# Patient Record
Sex: Female | Born: 1945 | Race: White | Hispanic: No | Smoking: Former smoker
Health system: Southern US, Community
[De-identification: ages and names within clinical notes are randomized; demographics above are authoritative.]

## PROBLEM LIST (undated history)

## (undated) DIAGNOSIS — K589 Irritable bowel syndrome without diarrhea: Secondary | ICD-10-CM

## (undated) DIAGNOSIS — F329 Major depressive disorder, single episode, unspecified: Secondary | ICD-10-CM

## (undated) DIAGNOSIS — I10 Essential (primary) hypertension: Secondary | ICD-10-CM

## (undated) DIAGNOSIS — R0609 Other forms of dyspnea: Secondary | ICD-10-CM

## (undated) DIAGNOSIS — J9601 Acute respiratory failure with hypoxia: Secondary | ICD-10-CM

## (undated) DIAGNOSIS — G8929 Other chronic pain: Secondary | ICD-10-CM

## (undated) DIAGNOSIS — K760 Fatty (change of) liver, not elsewhere classified: Secondary | ICD-10-CM

## (undated) DIAGNOSIS — M545 Low back pain, unspecified: Secondary | ICD-10-CM

## (undated) DIAGNOSIS — J189 Pneumonia, unspecified organism: Secondary | ICD-10-CM

## (undated) DIAGNOSIS — I2510 Atherosclerotic heart disease of native coronary artery without angina pectoris: Secondary | ICD-10-CM

## (undated) DIAGNOSIS — F419 Anxiety disorder, unspecified: Secondary | ICD-10-CM

## (undated) DIAGNOSIS — E559 Vitamin D deficiency, unspecified: Secondary | ICD-10-CM

## (undated) DIAGNOSIS — M94269 Chondromalacia, unspecified knee: Secondary | ICD-10-CM

## (undated) DIAGNOSIS — E785 Hyperlipidemia, unspecified: Secondary | ICD-10-CM

## (undated) DIAGNOSIS — H538 Other visual disturbances: Secondary | ICD-10-CM

## (undated) DIAGNOSIS — J42 Unspecified chronic bronchitis: Secondary | ICD-10-CM

## (undated) DIAGNOSIS — R0602 Shortness of breath: Secondary | ICD-10-CM

## (undated) DIAGNOSIS — M1990 Unspecified osteoarthritis, unspecified site: Secondary | ICD-10-CM

## (undated) DIAGNOSIS — G4733 Obstructive sleep apnea (adult) (pediatric): Secondary | ICD-10-CM

## (undated) DIAGNOSIS — Z9989 Dependence on other enabling machines and devices: Secondary | ICD-10-CM

## (undated) DIAGNOSIS — Z86010 Personal history of colonic polyps: Secondary | ICD-10-CM

## (undated) DIAGNOSIS — IMO0002 Reserved for concepts with insufficient information to code with codable children: Secondary | ICD-10-CM

## (undated) DIAGNOSIS — Z8719 Personal history of other diseases of the digestive system: Secondary | ICD-10-CM

## (undated) HISTORY — PX: CATARACT EXTRACTION W/ INTRAOCULAR LENS IMPLANT: SHX1309

## (undated) HISTORY — DX: Other forms of dyspnea: R06.09

## (undated) HISTORY — DX: Hyperlipidemia, unspecified: E78.5

## (undated) HISTORY — DX: Chondromalacia, unspecified knee: M94.269

## (undated) HISTORY — PX: DILATION AND CURETTAGE OF UTERUS: SHX78

## (undated) HISTORY — PX: COLONOSCOPY W/ BIOPSIES AND POLYPECTOMY: SHX1376

## (undated) HISTORY — DX: Personal history of colonic polyps: Z86.010

## (undated) HISTORY — DX: Anxiety disorder, unspecified: F41.9

## (undated) HISTORY — DX: Reserved for concepts with insufficient information to code with codable children: IMO0002

## (undated) HISTORY — PX: JOINT REPLACEMENT: SHX530

## (undated) HISTORY — DX: Vitamin D deficiency, unspecified: E55.9

## (undated) HISTORY — DX: Essential (primary) hypertension: I10

## (undated) HISTORY — DX: Atherosclerotic heart disease of native coronary artery without angina pectoris: I25.10

## (undated) HISTORY — DX: Irritable bowel syndrome, unspecified: K58.9

## (undated) HISTORY — DX: Personal history of other diseases of the digestive system: Z87.19

## (undated) HISTORY — PX: KNEE ARTHROSCOPY: SHX127

## (undated) HISTORY — DX: Major depressive disorder, single episode, unspecified: F32.9

## (undated) HISTORY — DX: Fatty (change of) liver, not elsewhere classified: K76.0

## (undated) HISTORY — PX: BOTOX INJECTION: SHX5754

## (undated) HISTORY — DX: Shortness of breath: R06.02

---
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
## 1970-04-22 HISTORY — PX: NASAL SEPTUM SURGERY: SHX37

## 1973-04-22 HISTORY — PX: TUBAL LIGATION: SHX77

## 1992-04-22 HISTORY — PX: OOPHORECTOMY: SHX86

## 1998-04-08 ENCOUNTER — Encounter: Payer: Self-pay | Admitting: Emergency Medicine

## 1998-04-08 ENCOUNTER — Emergency Department (HOSPITAL_COMMUNITY): Admission: EM | Admit: 1998-04-08 | Discharge: 1998-04-08 | Payer: Self-pay | Admitting: Emergency Medicine

## 1998-10-09 ENCOUNTER — Ambulatory Visit (HOSPITAL_COMMUNITY): Admission: RE | Admit: 1998-10-09 | Discharge: 1998-10-09 | Payer: Self-pay | Admitting: *Deleted

## 1998-11-10 ENCOUNTER — Ambulatory Visit (HOSPITAL_COMMUNITY): Admission: RE | Admit: 1998-11-10 | Discharge: 1998-11-10 | Payer: Self-pay | Admitting: Obstetrics & Gynecology

## 1999-01-19 ENCOUNTER — Other Ambulatory Visit: Admission: RE | Admit: 1999-01-19 | Discharge: 1999-01-19 | Payer: Self-pay | Admitting: Obstetrics & Gynecology

## 1999-11-28 ENCOUNTER — Encounter: Payer: Self-pay | Admitting: *Deleted

## 1999-11-28 ENCOUNTER — Encounter: Admission: RE | Admit: 1999-11-28 | Discharge: 1999-11-28 | Payer: Self-pay | Admitting: *Deleted

## 2000-02-14 ENCOUNTER — Other Ambulatory Visit: Admission: RE | Admit: 2000-02-14 | Discharge: 2000-02-14 | Payer: Self-pay | Admitting: Obstetrics & Gynecology

## 2000-05-16 ENCOUNTER — Encounter: Admission: RE | Admit: 2000-05-16 | Discharge: 2000-05-16 | Payer: Self-pay | Admitting: *Deleted

## 2000-05-16 ENCOUNTER — Encounter: Payer: Self-pay | Admitting: *Deleted

## 2000-05-29 ENCOUNTER — Ambulatory Visit (HOSPITAL_COMMUNITY): Admission: RE | Admit: 2000-05-29 | Discharge: 2000-05-29 | Payer: Self-pay | Admitting: *Deleted

## 2001-02-13 ENCOUNTER — Other Ambulatory Visit: Admission: RE | Admit: 2001-02-13 | Discharge: 2001-02-13 | Payer: Self-pay | Admitting: Obstetrics & Gynecology

## 2002-03-16 ENCOUNTER — Other Ambulatory Visit: Admission: RE | Admit: 2002-03-16 | Discharge: 2002-03-16 | Payer: Self-pay | Admitting: Obstetrics & Gynecology

## 2002-11-11 ENCOUNTER — Ambulatory Visit (HOSPITAL_BASED_OUTPATIENT_CLINIC_OR_DEPARTMENT_OTHER): Admission: RE | Admit: 2002-11-11 | Discharge: 2002-11-11 | Payer: Self-pay | Admitting: Family Medicine

## 2003-04-25 ENCOUNTER — Other Ambulatory Visit: Admission: RE | Admit: 2003-04-25 | Discharge: 2003-04-25 | Payer: Self-pay | Admitting: Obstetrics & Gynecology

## 2004-06-04 ENCOUNTER — Other Ambulatory Visit: Admission: RE | Admit: 2004-06-04 | Discharge: 2004-06-04 | Payer: Self-pay | Admitting: Obstetrics & Gynecology

## 2005-06-25 ENCOUNTER — Other Ambulatory Visit: Admission: RE | Admit: 2005-06-25 | Discharge: 2005-06-25 | Payer: Self-pay | Admitting: Obstetrics & Gynecology

## 2006-08-07 ENCOUNTER — Encounter: Admission: RE | Admit: 2006-08-07 | Discharge: 2006-08-07 | Payer: Self-pay | Admitting: Obstetrics & Gynecology

## 2006-08-29 ENCOUNTER — Ambulatory Visit (HOSPITAL_COMMUNITY): Admission: RE | Admit: 2006-08-29 | Discharge: 2006-08-29 | Payer: Self-pay | Admitting: Obstetrics & Gynecology

## 2006-08-29 ENCOUNTER — Encounter (INDEPENDENT_AMBULATORY_CARE_PROVIDER_SITE_OTHER): Payer: Self-pay | Admitting: Specialist

## 2006-08-29 HISTORY — PX: HYSTEROSCOPY WITH D & C: SHX1775

## 2007-02-12 ENCOUNTER — Emergency Department (HOSPITAL_COMMUNITY): Admission: EM | Admit: 2007-02-12 | Discharge: 2007-02-12 | Payer: Self-pay | Admitting: Unknown Physician Specialty

## 2008-03-16 ENCOUNTER — Encounter: Admission: RE | Admit: 2008-03-16 | Discharge: 2008-03-16 | Payer: Self-pay | Admitting: Diagnostic Radiology

## 2008-10-09 ENCOUNTER — Ambulatory Visit (HOSPITAL_COMMUNITY): Admission: RE | Admit: 2008-10-09 | Discharge: 2008-10-09 | Payer: Self-pay | Admitting: Chiropractic Medicine

## 2009-05-23 HISTORY — PX: TOTAL KNEE ARTHROPLASTY: SHX125

## 2009-05-29 ENCOUNTER — Inpatient Hospital Stay (HOSPITAL_COMMUNITY): Admission: RE | Admit: 2009-05-29 | Discharge: 2009-06-01 | Payer: Self-pay | Admitting: Orthopedic Surgery

## 2009-06-20 DEATH — deceased

## 2010-05-13 ENCOUNTER — Encounter: Payer: Self-pay | Admitting: Obstetrics & Gynecology

## 2010-07-11 LAB — CBC
HCT: 30.4 % — ABNORMAL LOW (ref 36.0–46.0)
HCT: 32.8 % — ABNORMAL LOW (ref 36.0–46.0)
HCT: 34.5 % — ABNORMAL LOW (ref 36.0–46.0)
HCT: 41.5 % (ref 36.0–46.0)
Hemoglobin: 10.9 g/dL — ABNORMAL LOW (ref 12.0–15.0)
Hemoglobin: 11.6 g/dL — ABNORMAL LOW (ref 12.0–15.0)
Hemoglobin: 12.3 g/dL (ref 12.0–15.0)
Hemoglobin: 14.4 g/dL (ref 12.0–15.0)
MCHC: 34.8 g/dL (ref 30.0–36.0)
MCHC: 35.3 g/dL (ref 30.0–36.0)
MCHC: 35.9 g/dL (ref 30.0–36.0)
MCV: 98.3 fL (ref 78.0–100.0)
MCV: 99 fL (ref 78.0–100.0)
MCV: 99.2 fL (ref 78.0–100.0)
RBC: 3.1 MIL/uL — ABNORMAL LOW (ref 3.87–5.11)
RBC: 3.31 MIL/uL — ABNORMAL LOW (ref 3.87–5.11)
RBC: 3.48 MIL/uL — ABNORMAL LOW (ref 3.87–5.11)
RBC: 4.18 MIL/uL (ref 3.87–5.11)
RDW: 10.9 % — ABNORMAL LOW (ref 11.5–15.5)
WBC: 9.1 10*3/uL (ref 4.0–10.5)

## 2010-07-11 LAB — COMPREHENSIVE METABOLIC PANEL
Albumin: 3.7 g/dL (ref 3.5–5.2)
Alkaline Phosphatase: 68 U/L (ref 39–117)
BUN: 25 mg/dL — ABNORMAL HIGH (ref 6–23)
Calcium: 9.4 mg/dL (ref 8.4–10.5)
Creatinine, Ser: 0.9 mg/dL (ref 0.4–1.2)
Glucose, Bld: 107 mg/dL — ABNORMAL HIGH (ref 70–99)
Potassium: 4.3 mEq/L (ref 3.5–5.1)
Total Protein: 7.2 g/dL (ref 6.0–8.3)

## 2010-07-11 LAB — PROTIME-INR
INR: 1.04 (ref 0.00–1.49)
INR: 1.1 (ref 0.00–1.49)
INR: 1.73 — ABNORMAL HIGH (ref 0.00–1.49)
Prothrombin Time: 13.5 seconds (ref 11.6–15.2)
Prothrombin Time: 21.3 seconds — ABNORMAL HIGH (ref 11.6–15.2)

## 2010-07-11 LAB — BASIC METABOLIC PANEL
CO2: 26 mEq/L (ref 19–32)
CO2: 28 mEq/L (ref 19–32)
Calcium: 8.3 mg/dL — ABNORMAL LOW (ref 8.4–10.5)
Chloride: 102 mEq/L (ref 96–112)
GFR calc Af Amer: >60 mL/min (ref >60)
GFR calc Af Amer: >60 mL/min (ref >60)
GFR calc non Af Amer: >60 mL/min (ref >60)
Glucose, Bld: 151 mg/dL — ABNORMAL HIGH (ref 70–99)
Potassium: 4 mEq/L (ref 3.5–5.1)
Potassium: 4.1 mEq/L (ref 3.5–5.1)
Sodium: 133 mEq/L — ABNORMAL LOW (ref 135–145)

## 2010-07-11 LAB — URINALYSIS, ROUTINE W REFLEX MICROSCOPIC
Bilirubin Urine: NEGATIVE
Glucose, UA: NEGATIVE mg/dL
Hgb urine dipstick: NEGATIVE
Ketones, ur: NEGATIVE mg/dL
Specific Gravity, Urine: 1.022 (ref 1.005–1.030)
pH: 5.5 (ref 5.0–8.0)

## 2010-07-11 LAB — TYPE AND SCREEN: Antibody Screen: NEGATIVE

## 2010-09-04 NOTE — Op Note (Signed)
[name], [name] NO.:  [id number]   MEDICAL RECORD NO.:  [id number]          PATIENT TYPE:  AMB   LOCATION:  SDC                           FACILITY:  [facility]   PHYSICIAN:  Freddy Finner, M.D.   DATE OF BIRTH:  1945/08/31   DATE OF PROCEDURE:  08/29/2006  DATE OF DISCHARGE:                               OPERATIVE REPORT   PREOPERATIVE DIAGNOSES:  1. Postmenopausal bleeding.  2. Endometrial polyp.   POSTOPERATIVE DIAGNOSES:  1. Postmenopausal bleeding.  2. Endometrial polyp.   PROCEDURE:  Hysteroscopy, D&C, and resection of endometrial polyp.   ANESTHESIA:  General.   ESTIMATED BLOOD LOSS:  10 mL.   SORBITOL DEFICIT:  65 mL.   INTRAOPERATIVE COMPLICATIONS:  None.   INDICATIONS FOR PROCEDURE:  The patient is a 65 year old on hormone  replacement therapy who had an episode of postmenopausal bleeding.  She  had a sonohistogram in the office demonstrating an endometrial polyp.  She is admitted now for hysteroscopy, D&C.   DESCRIPTION OF PROCEDURE:  She was admitted on the morning of surgery.  She was given a gram of Ancef preoperatively.  She was brought to the  operating room and placed under adequate general anesthesia.  She was  placed in the dorsal lithotomy position using the [employer] system.  Betadine prep of the mons, perineum, and vagina was carried out in the  usual fashion.  Sterile drapes were applied.   A bivalve speculum was introduced.  The cervix was grasped with a single-  tooth tenaculum on the anterior lip.  The os finder was then used to  navigate the internal os.  The uterus sounded to 9 cm.  The cervix was  progressively dilated to 23 with [facility] dilators.  A 12.5-degree  hysteroscope was introduced using 3% sorbitol as the distending medium.  The polyp was easily visualized, and photographs were made.  Gentle  thorough curettage and exploration with Randall stone forceps was  carried out.  Good inspection revealed adequate  resection of the polyp  and adequate endometrial sampling.  The procedure was terminated.  The  instruments were removed.   The patient was awakened and taken to the recovery room in good  condition.  She will be given routine outpatient surgical instructions.  She should follow up in the office in a week.  She has Vicodin to be  taken as needed for postoperative pain.      Freddy Finner, M.D.  Electronically Signed     WRN/MEDQ  D:  08/29/2006  T:  08/29/2006  Job:  [id number]

## 2011-01-15 ENCOUNTER — Other Ambulatory Visit: Payer: Self-pay | Admitting: Family Medicine

## 2011-01-15 DIAGNOSIS — M79609 Pain in unspecified limb: Secondary | ICD-10-CM

## 2011-01-17 ENCOUNTER — Ambulatory Visit
Admission: RE | Admit: 2011-01-17 | Discharge: 2011-01-17 | Disposition: A | Discharge status: Home / Self Care | Payer: Medicare HMO | Source: Ambulatory Visit | Attending: Family Medicine | Admitting: Family Medicine

## 2011-01-17 DIAGNOSIS — M79609 Pain in unspecified limb: Secondary | ICD-10-CM

## 2011-01-30 LAB — URINALYSIS, ROUTINE W REFLEX MICROSCOPIC
Bilirubin Urine: NEGATIVE
Glucose, UA: NEGATIVE
Hgb urine dipstick: NEGATIVE
Ketones, ur: NEGATIVE
Nitrite: NEGATIVE
Protein, ur: NEGATIVE
Specific Gravity, Urine: 1.012
Urobilinogen, UA: 0.2
pH: 5.5

## 2011-01-30 LAB — COMPREHENSIVE METABOLIC PANEL WITH GFR
AST: 20
Albumin: 3.7
BUN: 14
Chloride: 103
Creatinine, Ser: 0.71
GFR calc Af Amer: >60
Total Bilirubin: 0.9
Total Protein: 6.9

## 2011-01-30 LAB — CBC
HCT: 39.6
Hemoglobin: 14.1
MCHC: 35.6
MCV: 95.3
Platelets: 258
RBC: 4.15
RDW: 11.6
WBC: 9.5

## 2011-01-30 LAB — COMPREHENSIVE METABOLIC PANEL
ALT: 36 — ABNORMAL HIGH
Alkaline Phosphatase: 76
CO2: 25
Calcium: 9.3
GFR calc non Af Amer: >60
Glucose, Bld: 111 — ABNORMAL HIGH
Potassium: 4.1
Sodium: 138

## 2011-01-30 LAB — DIFFERENTIAL
Basophils Absolute: 0
Basophils Relative: 0
Eosinophils Absolute: 0.1
Eosinophils Relative: 1
Lymphocytes Relative: 26
Lymphs Abs: 2.5
Monocytes Absolute: 1.3 — ABNORMAL HIGH
Monocytes Relative: 13 — ABNORMAL HIGH
Neutro Abs: 5.7
Neutrophils Relative %: 59

## 2011-01-30 LAB — LIPASE, BLOOD: Lipase: 42

## 2011-02-03 ENCOUNTER — Emergency Department (HOSPITAL_COMMUNITY): Payer: Medicare HMO

## 2011-02-03 ENCOUNTER — Emergency Department (HOSPITAL_COMMUNITY)
Admission: EM | Admit: 2011-02-03 | Discharge: 2011-02-04 | Disposition: A | Discharge status: Home / Self Care | Payer: Medicare HMO | Attending: Emergency Medicine | Admitting: Emergency Medicine

## 2011-02-03 DIAGNOSIS — R062 Wheezing: Secondary | ICD-10-CM | POA: Insufficient documentation

## 2011-02-03 DIAGNOSIS — R05 Cough: Secondary | ICD-10-CM | POA: Insufficient documentation

## 2011-02-03 DIAGNOSIS — F17200 Nicotine dependence, unspecified, uncomplicated: Secondary | ICD-10-CM | POA: Insufficient documentation

## 2011-02-03 DIAGNOSIS — R000 Tachycardia, unspecified: Secondary | ICD-10-CM | POA: Insufficient documentation

## 2011-02-03 DIAGNOSIS — R059 Cough, unspecified: Secondary | ICD-10-CM | POA: Insufficient documentation

## 2011-02-03 DIAGNOSIS — R509 Fever, unspecified: Secondary | ICD-10-CM | POA: Insufficient documentation

## 2011-02-03 DIAGNOSIS — J40 Bronchitis, not specified as acute or chronic: Secondary | ICD-10-CM | POA: Insufficient documentation

## 2011-02-03 DIAGNOSIS — R0602 Shortness of breath: Secondary | ICD-10-CM | POA: Insufficient documentation

## 2011-05-02 ENCOUNTER — Other Ambulatory Visit: Payer: Self-pay | Admitting: Orthopedic Surgery

## 2011-05-13 ENCOUNTER — Encounter: Payer: Self-pay | Admitting: Vascular Surgery

## 2011-05-14 ENCOUNTER — Encounter: Payer: Self-pay | Admitting: Vascular Surgery

## 2011-05-15 ENCOUNTER — Ambulatory Visit (INDEPENDENT_AMBULATORY_CARE_PROVIDER_SITE_OTHER): Payer: BC Managed Care – PPO | Admitting: Vascular Surgery

## 2011-05-15 ENCOUNTER — Encounter: Payer: Self-pay | Admitting: Vascular Surgery

## 2011-05-15 VITALS — BP 126/85 | HR 99 | Resp 16 | Ht 60.0 in | Wt 181.0 lb

## 2011-05-15 DIAGNOSIS — M79609 Pain in unspecified limb: Secondary | ICD-10-CM | POA: Insufficient documentation

## 2011-05-15 NOTE — Assessment & Plan Note (Addendum)
This patient presented with pain in the pretibial area of her right leg. She states that she's had this for 2 years. The pain is aggravated by standing and relieved somewhat with elevation. Based on her Doppler study she has normal arterial flow bilaterally. I do not think her pain is related to arterial insufficiency. She does have telangiectasias in the right leg and she could be having some aching pain in the right leg related to venous insufficiency. We've discussed the importance of intermittent leg elevation. She can also consider wearing a compression stockings with a mild gradient of 15-20 mm of mercury. She is scheduled for a knee replacement on the right I do not see any contraindications for that from a vascular standpoint.

## 2011-05-15 NOTE — Progress Notes (Signed)
[facility]   Patient name: Michelle [name] MRN: [id number] DOB: April 30, 1945 Sex: female  REASON FOR CONSULT: right leg pain. Referred by Dr. Despina Hick  HPI: Michelle [name] is a 66 y.o. female who is had a 2 year history of pain in her right leg. She describes pain in the pretibial area of her right leg which is aggravated by standing and relieved somewhat with elevation. I do not get any history of claudication, rest pain, or nonhealing ulcers.  She's had a previous  Left total knee replacement and is now scheduled for right total replacement and April.  she has had no previous history of DVT or phlebitis.  Past Medical History  Diagnosis Date  . Chondromalacia of knee     right knee  . IBS (irritable bowel syndrome)   . Depression   . Hyperlipidemia   . Other abnormal glucose   . DDD (degenerative disc disease)     Family History  Problem Relation Age of Onset  . Heart disease Mother     Heart Disease before age 58  . Heart disease Father   . Hypertension Sister     SOCIAL HISTORY: History  Substance Use Topics  . Smoking status: Current Everyday Smoker -- 0.2 packs/day  . Smokeless tobacco: Not on file  . Alcohol Use: Yes     socially    No Known Allergies  Current Outpatient Prescriptions  Medication Sig Dispense Refill  . phentermine (ADIPEX-P) 37.5 MG tablet Take 37.5 mg by mouth daily before breakfast.      . Sertraline HCl (ZOLOFT PO) Take by mouth.      Marland Kitchen aspirin 81 MG tablet Take 81 mg by mouth daily.      Marland Kitchen HYDROcodone-acetaminophen (NORCO) 5-325 MG per tablet Take 1 tablet by mouth every 6 (six) hours as needed.        REVIEW OF SYSTEMS: Arly.Keller ] denotes positive finding; [  ] denotes negative finding  CARDIOVASCULAR:  [ ]  chest pain   [ ]  chest pressure   [ ]  palpitations   [ ]  orthopnea   [ ]  dyspnea on exertion   [ ]  claudication   [ ]  rest pain   [ ]  DVT   [ ]  phlebitis PULMONARY:   [ ]  productive cough   [ ]  asthma   [ ]   wheezing NEUROLOGIC:   [ ]  weakness  [ ]  paresthesias  [ ]  aphasia  [ ]  amaurosis  [ ]  dizziness HEMATOLOGIC:   [ ]  bleeding problems   [ ]  clotting disorders MUSCULOSKELETAL:  [ ]  joint pain   [ ]  joint swelling [ ]  leg swelling GASTROINTESTINAL: [ ]   blood in stool  [ ]   hematemesis GENITOURINARY:  [ ]   dysuria  [ ]   hematuria PSYCHIATRIC:  [ ]  history of major depression INTEGUMENTARY:  [ ]  rashes  [ ]  ulcers CONSTITUTIONAL:  [ ]  fever   [ ]  chills  PHYSICAL EXAM: Filed Vitals:   05/15/11 1048  BP: 126/85  Pulse: 99  Resp: 16  Height: 5' (1.524 m)  Weight: 181 lb (82.101 kg)  SpO2: 99%   Body mass index is 35.35 kg/(m^2). GENERAL: The patient is a well-nourished female, in no acute distress. The vital signs are documented above. CARDIOVASCULAR: There is a regular rate and rhythm without significant murmur appreciated. I do not detect carotid bruits. She has palpable femoral pulses. I cannot palpate pedal pulses although both feet are warm and  well-perfused. She has no significant lower extremity swelling. PULMONARY: There is good air exchange bilaterally without wheezing or rales. ABDOMEN: Soft and non-tender with normal pitched bowel sounds.  MUSCULOSKELETAL: There are no major deformities or cyanosis. NEUROLOGIC: No focal weakness or paresthesias are detected. SKIN: she has some spider veins and telangiectasias bilaterally. PSYCHIATRIC: The patient has a normal affect.  DATA:  I did perform an arterial Doppler study in the office today she has a biphasic Doppler signals in the dorsalis pedis and posterior tibial positions bilaterally.  MEDICAL ISSUES: Pain in limb This patient presented with pain in the pretibial area of her right leg. She states that she's had this for 2 years. The pain is aggravated by standing and relieved somewhat with elevation. Based on her Doppler study she has normal arterial flow bilaterally. I do not think her pain is related to arterial  insufficiency. She does have telangiectasias in the right leg and she could be having some aching pain in the right leg related to venous insufficiency. We've discussed the importance of intermittent leg elevation. She can also consider wearing a compression stockings with a mild gradient of 15-20 mm of mercury. She is scheduled for a knee replacement on the right I do not see any contraindications for that from a vascular standpoint.   I will see her back as needed.  DICKSON,CHRISTOPHER S [facility] [place] Beeper: [phone]

## 2011-07-22 ENCOUNTER — Encounter (HOSPITAL_COMMUNITY): Payer: Self-pay | Admitting: Pharmacy Technician

## 2011-07-24 ENCOUNTER — Encounter (HOSPITAL_COMMUNITY): Payer: Self-pay

## 2011-07-24 ENCOUNTER — Encounter (HOSPITAL_COMMUNITY)
Admission: RE | Admit: 2011-07-24 | Discharge: 2011-07-24 | Disposition: A | Discharge status: Home / Self Care | Payer: BC Managed Care – PPO | Source: Ambulatory Visit | Attending: Orthopedic Surgery | Admitting: Orthopedic Surgery

## 2011-07-24 DIAGNOSIS — F32A Depression, unspecified: Secondary | ICD-10-CM

## 2011-07-24 DIAGNOSIS — IMO0002 Reserved for concepts with insufficient information to code with codable children: Secondary | ICD-10-CM

## 2011-07-24 HISTORY — DX: Depression, unspecified: F32.A

## 2011-07-24 HISTORY — DX: Reserved for concepts with insufficient information to code with codable children: IMO0002

## 2011-07-24 LAB — URINALYSIS, ROUTINE W REFLEX MICROSCOPIC
Bilirubin Urine: NEGATIVE
Ketones, ur: NEGATIVE mg/dL
Nitrite: NEGATIVE
pH: 5 (ref 5.0–8.0)

## 2011-07-24 LAB — CBC
HCT: 42.7 % (ref 36.0–46.0)
Hemoglobin: 14.7 g/dL (ref 12.0–15.0)
MCV: 101.4 fL — ABNORMAL HIGH (ref 78.0–100.0)
Platelets: 262 10*3/uL (ref 150–400)
RBC: 4.21 MIL/uL (ref 3.87–5.11)
WBC: 6.9 10*3/uL (ref 4.0–10.5)

## 2011-07-24 LAB — COMPREHENSIVE METABOLIC PANEL
AST: 19 U/L (ref 0–37)
BUN: 21 mg/dL (ref 6–23)
CO2: 27 mEq/L (ref 19–32)
Chloride: 103 mEq/L (ref 96–112)
Creatinine, Ser: 0.71 mg/dL (ref 0.50–1.10)
GFR calc non Af Amer: 89 mL/min — ABNORMAL LOW (ref >90)
Glucose, Bld: 118 mg/dL — ABNORMAL HIGH (ref 70–99)
Total Bilirubin: 0.4 mg/dL (ref 0.3–1.2)

## 2011-07-24 LAB — SURGICAL PCR SCREEN: Staphylococcus aureus: INVALID — AB

## 2011-07-24 LAB — PROTIME-INR: INR: 0.91 (ref 0.00–1.49)

## 2011-07-24 NOTE — Patient Instructions (Signed)
20 Michelle [name]  07/24/2011   Your procedure is scheduled on: 4-15  -2013  Report to [facility] at   0845     AM .  Call this number if you have problems the morning of surgery: [phone]   Remember:   Do not eat food:After Midnight.    Take these medicines the morning of surgery with A SIP OF WATER: Hydrocodone. Sertraline.   Do not wear jewelry, make-up or nail polish.  Do not wear lotions, powders, or perfumes. You may wear deodorant.  Do not shave 48 hours prior to surgery.(face and neck okay, no shaving of legs)  Do not bring valuables to the hospital.  Contacts, dentures or bridgework may not be worn into surgery.  Leave suitcase in the car. After surgery it may be brought to your room.  For patients admitted to the hospital, checkout time is 11:00 AM the day of discharge.   Patients discharged the day of surgery will not be allowed to drive home.  Name and phone number of your driver: daughter-[name] [phone] Special Instructions: CHG Shower Use Special Wash: 1/2 bottle night before surgery and 1/2 bottle morning of surgery.(avoid face and genitals)   Please read over the following fact sheets that you were given: MRSA Information, Blood Transfusion fact sheet, Incentive Spirometry Instruction.

## 2011-07-26 LAB — MRSA CULTURE

## 2011-07-30 ENCOUNTER — Other Ambulatory Visit: Payer: Self-pay | Admitting: Orthopedic Surgery

## 2011-08-04 ENCOUNTER — Other Ambulatory Visit: Payer: Self-pay | Admitting: Orthopedic Surgery

## 2011-08-04 NOTE — H&P (Signed)
Michelle [name]  DOB: Sep 20, 1945 Single / Language: Undefined / Race: Undefined Female  Date of Admission:  08/05/2011  Chief Complaint:  Right Knee Pain  History of Present Illness The patient is a 66 year old female who comes in for a preoperative History and Physical. The patient is scheduled for a right total knee arthroplasty to be performed by Dr. Gus Rankin. Aluisio, MD at [facility] on 08/05/2011. The patient is a 66 year old female who is being followed for their right leg pain. She has had her left knee replaced in the past. Symptoms reported on her right knee include: pain, pain at night and swelling. The patient feels that they are doing poorly. Ms. [name] states that the right knee is what is giving her the most trouble now. She has pain at all times. The pain is anterior and posterior. She has a lot of pain in her lateral hamstrings. This knee is starting to hurt like the other one did prior to her surgery. She said the left knee is doing fantastic with no problems there. She is ready to proceed with surgery. They have been treated conservatively in the past for the above stated problem and despite conservative measures, they continue to have progressive pain and severe functional limitations and dysfunction. They have failed non-operative management. It is felt that they would benefit from undergoing total joint replacement. Risks and benefits of the procedure have been discussed with the patient and they elect to proceed with surgery. There are no active contraindications to surgery such as ongoing infection or rapidly progressive neurological disease.  Allergies No Known Allergies.  Medication History Zoloft ( Oral) Specific dose unknown - Active. OxyCODONE HCl (5MG  Tablet, 1-2 TAB PO Q 4-6 H PRN PAIN  Past Surgical History Cesarean Delivery. 2 times; 1972 & 1975 Cataract Surgery. Date: 2012. bilateral Straighten Nasal Septum Dilation and Curettage of Uterus  - Multiple Arthroscopy of Knee. bilateral Appendectomy. Date: 40. Breast Mass; Local Excision. right (benign) Total Knee Replacement. Date: 2011. left Tubal Ligation. Date: 58. Oophorectomy; Unilateral. Date: 70.  Problem List/Past Medical Osteoarthrosis NOS, lower leg Anxiety Disorder Irritable bowel syndrome Depression Bronchitis. Past History Pneumonia. Past History Menopause  Family History Cerebrovascular Accident. father Hypertension. sister Heart Disease. mother and father  Social History Illicit drug use. no Exercise. Exercises weekly; does other Marital status. divorced Living situation. live alone Drug/Alcohol Rehab (Previously). no Children. 2 Alcohol use. current drinker; drinks wine; 5-7 per week Drug/Alcohol Rehab (Currently). 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 2-3 Tobacco use. Current every day smoker. current every day smoker; smoke(d) less than 1/2 pack(s) per day Tobacco / smoke exposure. no Post-Surgical Plans. Plan is to go home. Advance Directives. Living Will, Healthcare POA  Review of Systems General:Not Present- Chills, Fever, Night Sweats, Fatigue, Weight Gain, Weight Loss and Memory Loss. Skin:Not Present- Hives, Itching, Rash, Eczema and Lesions. HEENT:Not Present- Tinnitus, Headache, Double Vision, Visual Loss, Hearing Loss and Dentures. Respiratory:Not Present- Shortness of breath with exertion, Shortness of breath at rest, Allergies, Coughing up blood and Chronic Cough. Cardiovascular:Not Present- Chest Pain, Racing/skipping heartbeats, Difficulty Breathing Lying Down, Murmur, Swelling and Palpitations. Gastrointestinal:Not Present- Bloody Stool, Heartburn, Abdominal Pain, Vomiting, Nausea, Constipation, Diarrhea, Difficulty Swallowing, Jaundice and Loss of appetitie. Female Genitourinary:Not Present- Blood in Urine, Urinary frequency,  Weak urinary stream, Discharge, Flank Pain, Incontinence, Painful Urination, Urgency, Urinary Retention and Urinating at Night. Musculoskeletal:Present- Joint Pain. Not Present-  Muscle Weakness, Muscle Pain, Joint Swelling, Back Pain, Morning Stiffness and Spasms. Neurological:Not Present- Tremor, Dizziness, Blackout spells, Paralysis, Difficulty with balance and Weakness. Psychiatric:Not Present- Insomnia.  Vitals Pulse: 84 (Regular) Resp.: 16 (Unlabored) BP: 128/78 (Sitting, Left Arm, Standard)  Physical Exam The physical exam findings are as follows: Patient is a 66 year old female with continued knee pain.   General Mental Status - Alert, cooperative and good historian. General Appearance- pleasant. Not in acute distress. Orientation- Oriented X3. Build & Nutrition- Well nourished and Well developed.   Head and Neck Head- normocephalic, atraumatic . Neck Global Assessment- supple. no bruit auscultated on the right and no bruit auscultated on the left.   Eye Pupil- Bilateral- Regular and Round. Note: reading glasses Motion- Bilateral- EOMI.   Chest and Lung Exam Auscultation: Breath sounds:- clear at anterior chest wall and - clear at posterior chest wall. Adventitious sounds:- No Adventitious sounds.   Cardiovascular Auscultation:Rhythm- Regular rate and rhythm. Heart Sounds- S1 WNL and S2 WNL. Murmurs & Other Heart Sounds:Auscultation of the heart reveals - No Murmurs.   Abdomen Palpation/Percussion:Tenderness- Abdomen is non-tender to palpation. Rigidity (guarding)- Abdomen is soft. Auscultation:Auscultation of the abdomen reveals - Bowel sounds normal.   Female Genitourinary Not done, not pertinent to present illness  Musculoskeletal She is alert and oriented. She is in no apparent distress. The right knee shows no effusion. The range is 5-120 with moderate crepitus on range of motion. There is tenderness medial and  lateral. No instability is noted. There is no Baker's cyst palpable. The lower leg edema is essentially gone now. The pulses are intact with intact sensation distally.  RADIOGRAPHS: AP and lateral of the knee taken today shows she has essentially bone on bone arthritis, medial and patellofemoral compartments. There is a sliver of space left but this is very close to bone on bone. She has osteophyte formation present also.  Assessment & Plan Osteoarthrosis Right Knee  Plan is for a Right Total Knee Replacement by Dr. Lequita Halt.  Plan is to go home after the hospital stay.  Avel Peace, PA-C

## 2011-08-05 ENCOUNTER — Encounter (HOSPITAL_COMMUNITY): Payer: Self-pay | Admitting: Anesthesiology

## 2011-08-05 ENCOUNTER — Ambulatory Visit (HOSPITAL_COMMUNITY): Payer: BC Managed Care – PPO | Admitting: Anesthesiology

## 2011-08-05 ENCOUNTER — Encounter (HOSPITAL_COMMUNITY)
Admission: RE | Disposition: A | Discharge status: Home Health | Payer: Self-pay | Source: Ambulatory Visit | Attending: Orthopedic Surgery

## 2011-08-05 ENCOUNTER — Encounter (HOSPITAL_COMMUNITY): Payer: Self-pay | Admitting: Orthopedic Surgery

## 2011-08-05 ENCOUNTER — Inpatient Hospital Stay (HOSPITAL_COMMUNITY)
Admission: RE | Admit: 2011-08-05 | Discharge: 2011-08-08 | DRG: 209 | Disposition: A | Discharge status: Home Health | Payer: BC Managed Care – PPO | Source: Ambulatory Visit | Attending: Orthopedic Surgery | Admitting: Orthopedic Surgery

## 2011-08-05 ENCOUNTER — Encounter (HOSPITAL_COMMUNITY): Payer: Self-pay | Admitting: *Deleted

## 2011-08-05 DIAGNOSIS — F3289 Other specified depressive episodes: Secondary | ICD-10-CM | POA: Diagnosis present

## 2011-08-05 DIAGNOSIS — F329 Major depressive disorder, single episode, unspecified: Secondary | ICD-10-CM | POA: Diagnosis present

## 2011-08-05 DIAGNOSIS — F411 Generalized anxiety disorder: Secondary | ICD-10-CM | POA: Diagnosis present

## 2011-08-05 DIAGNOSIS — M179 Osteoarthritis of knee, unspecified: Secondary | ICD-10-CM

## 2011-08-05 DIAGNOSIS — E871 Hypo-osmolality and hyponatremia: Secondary | ICD-10-CM | POA: Diagnosis not present

## 2011-08-05 DIAGNOSIS — Z96659 Presence of unspecified artificial knee joint: Secondary | ICD-10-CM

## 2011-08-05 DIAGNOSIS — M1710 Unilateral primary osteoarthritis, unspecified knee: Principal | ICD-10-CM | POA: Diagnosis present

## 2011-08-05 HISTORY — PX: TOTAL KNEE ARTHROPLASTY: SHX125

## 2011-08-05 LAB — TYPE AND SCREEN: Antibody Screen: NEGATIVE

## 2011-08-05 SURGERY — ARTHROPLASTY, KNEE, TOTAL
Anesthesia: Spinal | Site: Knee | Laterality: Right | Wound class: Clean

## 2011-08-05 MED ORDER — CEFAZOLIN SODIUM 1-5 GM-% IV SOLN
INTRAVENOUS | Status: AC
  Filled 2011-08-05: qty 100

## 2011-08-05 MED ORDER — HYDROMORPHONE HCL PF 1 MG/ML IJ SOLN
INTRAMUSCULAR | Status: AC
  Administered 2011-08-05: 0.5 mg via INTRAVENOUS
  Filled 2011-08-05: qty 1

## 2011-08-05 MED ORDER — MIDAZOLAM HCL 5 MG/5ML IJ SOLN
INTRAMUSCULAR | Status: DC | PRN
  Administered 2011-08-05: 2 mg via INTRAVENOUS

## 2011-08-05 MED ORDER — TEMAZEPAM 15 MG PO CAPS: 15.0000 mg | ORAL_CAPSULE | Freq: Every evening | ORAL | Status: DC | PRN

## 2011-08-05 MED ORDER — DEXAMETHASONE SODIUM PHOSPHATE 10 MG/ML IJ SOLN
10.0000 mg | Freq: Once | INTRAMUSCULAR | Status: DC
  Filled 2011-08-05: qty 1

## 2011-08-05 MED ORDER — BUPIVACAINE 0.25 % ON-Q PUMP SINGLE CATH 300ML
INJECTION | Status: DC | PRN
  Administered 2011-08-05: 300 mL

## 2011-08-05 MED ORDER — BUPIVACAINE 0.25 % ON-Q PUMP SINGLE CATH 300ML
300.0000 mL | INJECTION | Status: DC
  Filled 2011-08-05: qty 300

## 2011-08-05 MED ORDER — DOCUSATE SODIUM 100 MG PO CAPS
100.0000 mg | ORAL_CAPSULE | Freq: Two times a day (BID) | ORAL | Status: DC
  Administered 2011-08-05 – 2011-08-08 (×4): 100 mg via ORAL
  Filled 2011-08-05 (×7): qty 1

## 2011-08-05 MED ORDER — CEFAZOLIN SODIUM 1-5 GM-% IV SOLN
1.0000 g | Freq: Four times a day (QID) | INTRAVENOUS | Status: AC
Start: 2011-08-05 — End: 2011-08-06
  Administered 2011-08-05 – 2011-08-06 (×3): 1 g via INTRAVENOUS
  Filled 2011-08-05 (×3): qty 50

## 2011-08-05 MED ORDER — HYDROMORPHONE HCL PF 1 MG/ML IJ SOLN
0.2500 mg | INTRAMUSCULAR | Status: DC | PRN
  Administered 2011-08-05: 0.5 mg via INTRAVENOUS

## 2011-08-05 MED ORDER — ACETAMINOPHEN 10 MG/ML IV SOLN
1000.0000 mg | Freq: Once | INTRAVENOUS | Status: DC
  Filled 2011-08-05: qty 100

## 2011-08-05 MED ORDER — METOCLOPRAMIDE HCL 10 MG PO TABS: 5.0000 mg | ORAL_TABLET | Freq: Three times a day (TID) | ORAL | Status: DC | PRN

## 2011-08-05 MED ORDER — NALOXONE HCL 0.4 MG/ML IJ SOLN: 0.4000 mg | INTRAMUSCULAR | Status: DC | PRN

## 2011-08-05 MED ORDER — SERTRALINE HCL 100 MG PO TABS
100.0000 mg | ORAL_TABLET | Freq: Every day | ORAL | Status: DC
  Administered 2011-08-05 – 2011-08-08 (×4): 100 mg via ORAL
  Filled 2011-08-05 (×4): qty 1

## 2011-08-05 MED ORDER — MENTHOL 3 MG MT LOZG: 1.0000 | LOZENGE | OROMUCOSAL | Status: DC | PRN

## 2011-08-05 MED ORDER — SODIUM CHLORIDE 0.9 % IR SOLN
Status: DC | PRN
  Administered 2011-08-05: 1000 mL

## 2011-08-05 MED ORDER — LACTATED RINGERS IV SOLN
INTRAVENOUS | Status: DC
  Administered 2011-08-05 (×3): via INTRAVENOUS

## 2011-08-05 MED ORDER — MORPHINE SULFATE (PF) 1 MG/ML IV SOLN
INTRAVENOUS | Status: DC
  Administered 2011-08-05: 7 mg via INTRAVENOUS
  Administered 2011-08-05: 4 mg via INTRAVENOUS
  Administered 2011-08-05 – 2011-08-06 (×2): via INTRAVENOUS
  Filled 2011-08-05: qty 25

## 2011-08-05 MED ORDER — DIPHENHYDRAMINE HCL 12.5 MG/5ML PO ELIX
12.5000 mg | ORAL_SOLUTION | Freq: Four times a day (QID) | ORAL | Status: DC | PRN
  Filled 2011-08-05: qty 5

## 2011-08-05 MED ORDER — RIVAROXABAN 10 MG PO TABS
10.0000 mg | ORAL_TABLET | Freq: Every day | ORAL | Status: DC
  Administered 2011-08-06 – 2011-08-08 (×3): 10 mg via ORAL
  Filled 2011-08-05 (×3): qty 1

## 2011-08-05 MED ORDER — ONDANSETRON HCL 4 MG/2ML IJ SOLN: 4.0000 mg | Freq: Four times a day (QID) | INTRAMUSCULAR | Status: DC | PRN

## 2011-08-05 MED ORDER — METOCLOPRAMIDE HCL 5 MG/ML IJ SOLN: 5.0000 mg | Freq: Three times a day (TID) | INTRAMUSCULAR | Status: DC | PRN

## 2011-08-05 MED ORDER — BUPIVACAINE IN DEXTROSE 0.75-8.25 % IT SOLN
INTRATHECAL | Status: DC | PRN
  Administered 2011-08-05: 1.8 mL via INTRATHECAL

## 2011-08-05 MED ORDER — DIPHENHYDRAMINE HCL 50 MG/ML IJ SOLN: 12.5000 mg | Freq: Four times a day (QID) | INTRAMUSCULAR | Status: DC | PRN

## 2011-08-05 MED ORDER — ACETAMINOPHEN 325 MG PO TABS: 650.0000 mg | ORAL_TABLET | Freq: Four times a day (QID) | ORAL | Status: DC | PRN

## 2011-08-05 MED ORDER — ONDANSETRON HCL 4 MG PO TABS: 4.0000 mg | ORAL_TABLET | Freq: Four times a day (QID) | ORAL | Status: DC | PRN

## 2011-08-05 MED ORDER — MEPERIDINE HCL 50 MG/ML IJ SOLN: 6.2500 mg | INTRAMUSCULAR | Status: DC | PRN

## 2011-08-05 MED ORDER — MORPHINE SULFATE (PF) 1 MG/ML IV SOLN
INTRAVENOUS | Status: AC
  Filled 2011-08-05: qty 25

## 2011-08-05 MED ORDER — BUPIVACAINE 0.25 % ON-Q PUMP SINGLE CATH 300ML
INJECTION | Status: AC
  Filled 2011-08-05: qty 300

## 2011-08-05 MED ORDER — DIPHENHYDRAMINE HCL 12.5 MG/5ML PO ELIX: 12.5000 mg | ORAL_SOLUTION | ORAL | Status: DC | PRN

## 2011-08-05 MED ORDER — PROPOFOL 10 MG/ML IV EMUL
INTRAVENOUS | Status: DC | PRN
  Administered 2011-08-05: 140 ug/kg/min via INTRAVENOUS

## 2011-08-05 MED ORDER — ONDANSETRON HCL 4 MG/2ML IJ SOLN
INTRAMUSCULAR | Status: DC | PRN
  Administered 2011-08-05: 4 mg via INTRAVENOUS

## 2011-08-05 MED ORDER — BISACODYL 10 MG RE SUPP: 10.0000 mg | Freq: Every day | RECTAL | Status: DC | PRN

## 2011-08-05 MED ORDER — ACETAMINOPHEN 10 MG/ML IV SOLN
INTRAVENOUS | Status: DC | PRN
  Administered 2011-08-05: 1000 mg via INTRAVENOUS

## 2011-08-05 MED ORDER — ACETAMINOPHEN 10 MG/ML IV SOLN
1000.0000 mg | Freq: Four times a day (QID) | INTRAVENOUS | Status: AC
  Administered 2011-08-05 – 2011-08-06 (×4): 1000 mg via INTRAVENOUS
  Filled 2011-08-05 (×5): qty 100

## 2011-08-05 MED ORDER — LACTATED RINGERS IV SOLN: INTRAVENOUS | Status: DC

## 2011-08-05 MED ORDER — ACETAMINOPHEN 10 MG/ML IV SOLN
INTRAVENOUS | Status: AC
  Filled 2011-08-05: qty 100

## 2011-08-05 MED ORDER — PROMETHAZINE HCL 25 MG/ML IJ SOLN: 6.2500 mg | INTRAMUSCULAR | Status: DC | PRN

## 2011-08-05 MED ORDER — SODIUM CHLORIDE 0.9 % IV SOLN
INTRAVENOUS | Status: DC
  Administered 2011-08-05 – 2011-08-07 (×2): via INTRAVENOUS

## 2011-08-05 MED ORDER — PHENOL 1.4 % MT LIQD: 1.0000 | OROMUCOSAL | Status: DC | PRN

## 2011-08-05 MED ORDER — FENTANYL CITRATE 0.05 MG/ML IJ SOLN
INTRAMUSCULAR | Status: DC | PRN
  Administered 2011-08-05: 100 ug via INTRAVENOUS
  Administered 2011-08-05 (×2): 50 ug via INTRAVENOUS

## 2011-08-05 MED ORDER — LACTATED RINGERS IV SOLN
INTRAVENOUS | Status: DC
Start: 2011-08-05 — End: 2011-08-05

## 2011-08-05 MED ORDER — METHOCARBAMOL 500 MG PO TABS
500.0000 mg | ORAL_TABLET | Freq: Four times a day (QID) | ORAL | Status: DC | PRN
  Administered 2011-08-05 – 2011-08-08 (×8): 500 mg via ORAL
  Filled 2011-08-05 (×9): qty 1

## 2011-08-05 MED ORDER — METHOCARBAMOL 100 MG/ML IJ SOLN
500.0000 mg | Freq: Four times a day (QID) | INTRAVENOUS | Status: DC | PRN
  Administered 2011-08-06: 500 mg via INTRAVENOUS
  Filled 2011-08-05: qty 5

## 2011-08-05 MED ORDER — SODIUM CHLORIDE 0.9 % IJ SOLN: 9.0000 mL | INTRAMUSCULAR | Status: DC | PRN

## 2011-08-05 MED ORDER — HYDROMORPHONE HCL PF 1 MG/ML IJ SOLN: 0.2500 mg | INTRAMUSCULAR | Status: DC | PRN

## 2011-08-05 MED ORDER — TRAMADOL HCL 50 MG PO TABS: 50.0000 mg | ORAL_TABLET | Freq: Four times a day (QID) | ORAL | Status: DC | PRN

## 2011-08-05 MED ORDER — CEFAZOLIN SODIUM-DEXTROSE 2-3 GM-% IV SOLR
2.0000 g | Freq: Once | INTRAVENOUS | Status: AC
  Administered 2011-08-05: 2 g via INTRAVENOUS

## 2011-08-05 MED ORDER — POLYETHYLENE GLYCOL 3350 17 G PO PACK
17.0000 g | PACK | Freq: Every day | ORAL | Status: DC | PRN
  Filled 2011-08-05: qty 1

## 2011-08-05 MED ORDER — ACETAMINOPHEN 650 MG RE SUPP: 650.0000 mg | Freq: Four times a day (QID) | RECTAL | Status: DC | PRN

## 2011-08-05 MED ORDER — FLEET ENEMA 7-19 GM/118ML RE ENEM: 1.0000 | ENEMA | Freq: Once | RECTAL | Status: AC | PRN

## 2011-08-05 MED ORDER — OXYCODONE HCL 5 MG PO TABS
5.0000 mg | ORAL_TABLET | ORAL | Status: DC | PRN
  Administered 2011-08-05 – 2011-08-08 (×17): 10 mg via ORAL
  Filled 2011-08-05 (×17): qty 2

## 2011-08-05 MED ORDER — DEXAMETHASONE SODIUM PHOSPHATE 10 MG/ML IJ SOLN
INTRAMUSCULAR | Status: DC | PRN
  Administered 2011-08-05: 10 mg via INTRAVENOUS

## 2011-08-05 MED ORDER — BUPIVACAINE ON-Q PAIN PUMP (FOR ORDER SET NO CHG)
INJECTION | Status: DC
  Filled 2011-08-05: qty 1

## 2011-08-05 MED ORDER — CHLORHEXIDINE GLUCONATE 4 % EX LIQD
60.0000 mL | Freq: Once | CUTANEOUS | Status: DC
  Filled 2011-08-05: qty 60

## 2011-08-05 MED ORDER — SODIUM CHLORIDE 0.9 % IV SOLN: INTRAVENOUS | Status: DC

## 2011-08-05 SURGICAL SUPPLY — 52 items
BAG SPEC THK2 15X12 ZIP CLS (MISCELLANEOUS) ×1
BAG ZIPLOCK 12X15 (MISCELLANEOUS) ×2 IMPLANT
BANDAGE ELASTIC 6 VELCRO ST LF (GAUZE/BANDAGES/DRESSINGS) ×2 IMPLANT
BANDAGE ESMARK 6X9 LF (GAUZE/BANDAGES/DRESSINGS) ×1 IMPLANT
BLADE SAG 18X100X1.27 (BLADE) ×2 IMPLANT
BLADE SAW SGTL 11.0X1.19X90.0M (BLADE) ×2 IMPLANT
BNDG CMPR 9X6 STRL LF SNTH (GAUZE/BANDAGES/DRESSINGS) ×1
BNDG ESMARK 6X9 LF (GAUZE/BANDAGES/DRESSINGS) ×2
BOWL SMART MIX CTS (DISPOSABLE) ×2 IMPLANT
CATH KIT ON-Q SILVERSOAK 5 (CATHETERS) ×1 IMPLANT
CATH KIT ON-Q SILVERSOAK 5IN (CATHETERS) ×2 IMPLANT
CEMENT HV SMART SET (Cement) ×4 IMPLANT
CLOTH BEACON ORANGE TIMEOUT ST (SAFETY) ×2 IMPLANT
CUFF TOURN SGL QUICK 34 (TOURNIQUET CUFF) ×2
CUFF TRNQT CYL 34X4X40X1 (TOURNIQUET CUFF) ×1 IMPLANT
DRAPE EXTREMITY T 121X128X90 (DRAPE) ×2 IMPLANT
DRAPE POUCH INSTRU U-SHP 10X18 (DRAPES) ×2 IMPLANT
DRAPE U-SHAPE 47X51 STRL (DRAPES) ×2 IMPLANT
DRSG ADAPTIC 3X8 NADH LF (GAUZE/BANDAGES/DRESSINGS) ×2 IMPLANT
DRSG PAD ABDOMINAL 8X10 ST (GAUZE/BANDAGES/DRESSINGS) ×1 IMPLANT
DURAPREP 26ML APPLICATOR (WOUND CARE) ×2 IMPLANT
ELECT REM PT RETURN 9FT ADLT (ELECTROSURGICAL) ×2
ELECTRODE REM PT RTRN 9FT ADLT (ELECTROSURGICAL) ×1 IMPLANT
EVACUATOR 1/8 PVC DRAIN (DRAIN) ×2 IMPLANT
FACESHIELD LNG OPTICON STERILE (SAFETY) ×10 IMPLANT
GLOVE BIO SURGEON STRL SZ7.5 (GLOVE) ×2 IMPLANT
GLOVE BIO SURGEON STRL SZ8 (GLOVE) ×2 IMPLANT
GLOVE BIOGEL PI IND STRL 8 (GLOVE) ×2 IMPLANT
GLOVE BIOGEL PI INDICATOR 8 (GLOVE) ×2
GOWN STRL NON-REIN LRG LVL3 (GOWN DISPOSABLE) ×2 IMPLANT
GOWN STRL REIN XL XLG (GOWN DISPOSABLE) ×2 IMPLANT
HANDPIECE INTERPULSE COAX TIP (DISPOSABLE) ×2
IMMOBILIZER KNEE 20 (SOFTGOODS) ×2
IMMOBILIZER KNEE 20 THIGH 36 (SOFTGOODS) ×1 IMPLANT
KIT BASIN OR (CUSTOM PROCEDURE TRAY) ×2 IMPLANT
MANIFOLD NEPTUNE II (INSTRUMENTS) ×2 IMPLANT
NS IRRIG 1000ML POUR BTL (IV SOLUTION) ×2 IMPLANT
PACK TOTAL JOINT (CUSTOM PROCEDURE TRAY) ×2 IMPLANT
PADDING CAST COTTON 6X4 STRL (CAST SUPPLIES) ×6 IMPLANT
POSITIONER SURGICAL ARM (MISCELLANEOUS) ×2 IMPLANT
SET HNDPC FAN SPRY TIP SCT (DISPOSABLE) ×1 IMPLANT
SPONGE GAUZE 4X4 12PLY (GAUZE/BANDAGES/DRESSINGS) ×2 IMPLANT
STRIP CLOSURE SKIN 1/2X4 (GAUZE/BANDAGES/DRESSINGS) ×4 IMPLANT
SUCTION FRAZIER 12FR DISP (SUCTIONS) ×2 IMPLANT
SUT MNCRL AB 4-0 PS2 18 (SUTURE) ×2 IMPLANT
SUT VIC AB 2-0 CT1 27 (SUTURE) ×6
SUT VIC AB 2-0 CT1 TAPERPNT 27 (SUTURE) ×3 IMPLANT
SUT VLOC 180 0 24IN GS25 (SUTURE) ×2 IMPLANT
TOWEL OR 17X26 10 PK STRL BLUE (TOWEL DISPOSABLE) ×4 IMPLANT
TRAY FOLEY CATH 14FRSI W/METER (CATHETERS) ×2 IMPLANT
WATER STERILE IRR 1500ML POUR (IV SOLUTION) ×2 IMPLANT
WRAP KNEE MAXI GEL POST OP (GAUZE/BANDAGES/DRESSINGS) ×3 IMPLANT

## 2011-08-05 NOTE — Interval H&P Note (Signed)
History and Physical Interval Note:  08/05/2011 10:55 AM  Michelle [name]  has presented today for surgery, with the diagnosis of osteoarthitis Right knee   The various methods of treatment have been discussed with the patient and family. After consideration of risks, benefits and other options for treatment, the patient has consented to  Procedure(s) (LRB): TOTAL KNEE ARTHROPLASTY (Right) as a surgical intervention .  The patients' history has been reviewed, patient examined, no change in status, stable for surgery.  I have reviewed the patients' chart and labs.  Questions were answered to the patient's satisfaction.     Loanne Drilling

## 2011-08-05 NOTE — Anesthesia Postprocedure Evaluation (Signed)
  Anesthesia Post-op Note  Patient: Michelle [name]  Procedure(s) Performed: Procedure(s) (LRB): TOTAL KNEE ARTHROPLASTY (Right)  Patient Location: PACU  Anesthesia Type: SAB  Level of Consciousness: awake and alert   Airway and Oxygen Therapy: Patient Spontanous Breathing  Post-op Pain: mild  Post-op Assessment: Post-op Vital signs reviewed, Patient's Cardiovascular Status Stable, Respiratory Function Stable, Patent Airway and No signs of Nausea or vomiting  Post-op Vital Signs: stable  Complications: No apparent anesthesia complications

## 2011-08-05 NOTE — Anesthesia Preprocedure Evaluation (Addendum)
Anesthesia Evaluation  Patient identified by MRN, date of birth, ID band Patient awake    Reviewed: Allergy & Precautions, H&P , NPO status , Patient's Chart, lab work & pertinent test results  Airway Mallampati: II TM Distance: >3 FB Neck ROM: Full    Dental No notable dental hx.    Pulmonary neg pulmonary ROS, Current Smoker,  breath sounds clear to auscultation  Pulmonary exam normal       Cardiovascular negative cardio ROS  Rhythm:Regular Rate:Normal     Neuro/Psych negative neurological ROS  negative psych ROS   GI/Hepatic negative GI ROS, Neg liver ROS,   Endo/Other  negative endocrine ROS  Renal/GU negative Renal ROS  negative genitourinary   Musculoskeletal negative musculoskeletal ROS (+)   Abdominal   Peds negative pediatric ROS (+)  Hematology negative hematology ROS (+)   Anesthesia Other Findings Upper front caps   Reproductive/Obstetrics negative OB ROS                          Anesthesia Physical Anesthesia Plan  ASA: II  Anesthesia Plan: Spinal   Post-op Pain Management:    Induction: Intravenous  Airway Management Planned:   Additional Equipment:   Intra-op Plan:   Post-operative Plan: Extubation in OR  Informed Consent: I have reviewed the patients History and Physical, chart, labs and discussed the procedure including the risks, benefits and alternatives for the proposed anesthesia with the patient or authorized representative who has indicated his/her understanding and acceptance.   Dental advisory given  Plan Discussed with: CRNA  Anesthesia Plan Comments:         Anesthesia Quick Evaluation

## 2011-08-05 NOTE — Anesthesia Procedure Notes (Signed)
Spinal  Patient location during procedure: OR Staffing Anesthesiologist: Phillips Grout Performed by: anesthesiologist  Preanesthetic Checklist Completed: patient identified, site marked, surgical consent, pre-op evaluation, timeout performed, IV checked, risks and benefits discussed and monitors and equipment checked Spinal Block Patient position: sitting Prep: Betadine Patient monitoring: heart rate, continuous pulse ox and blood pressure Approach: midline Location: L4-5 Injection technique: single-shot Needle Needle type: Spinocan and Pencil-Tip  Needle gauge: 24 G Needle length: 9 cm Additional Notes Expiration date of kit checked and confirmed. Patient tolerated procedure well, without complications.

## 2011-08-05 NOTE — H&P (View-Only) (Signed)
Michelle [name]  DOB: 04/06/1946 Single / Language: Undefined / Race: Undefined Female  Date of Admission:  08/05/2011  Chief Complaint:  Right Knee Pain  History of Present Illness The patient is a 66 year old female who comes in for a preoperative History and Physical. The patient is scheduled for a right total knee arthroplasty to be performed by Dr. Frank V. Aluisio, MD at [facility] on 08/05/2011. The patient is a 65 year old female who is being followed for their right leg pain. She has had her left knee replaced in the past. Symptoms reported on her right knee include: pain, pain at night and swelling. The patient feels that they are doing poorly. Michelle [name] states that the right knee is what is giving her the most trouble now. She has pain at all times. The pain is anterior and posterior. She has a lot of pain in her lateral hamstrings. This knee is starting to hurt like the other one did prior to her surgery. She said the left knee is doing fantastic with no problems there. She is ready to proceed with surgery. They have been treated conservatively in the past for the above stated problem and despite conservative measures, they continue to have progressive pain and severe functional limitations and dysfunction. They have failed non-operative management. It is felt that they would benefit from undergoing total joint replacement. Risks and benefits of the procedure have been discussed with the patient and they elect to proceed with surgery. There are no active contraindications to surgery such as ongoing infection or rapidly progressive neurological disease.  Allergies No Known Allergies.  Medication History Zoloft ( Oral) Specific dose unknown - Active. OxyCODONE HCl (5MG Tablet, 1-2 TAB PO Q 4-6 H PRN PAIN  Past Surgical History Cesarean Delivery. 2 times; 1972 & 1975 Cataract Surgery. Date: 2012. bilateral Straighten Nasal Septum Dilation and Curettage of Uterus  - Multiple Arthroscopy of Knee. bilateral Appendectomy. Date: 1967. Breast Mass; Local Excision. right (benign) Total Knee Replacement. Date: 2011. left Tubal Ligation. Date: 1976. Oophorectomy; Unilateral. Date: 1994.  Problem List/Past Medical Osteoarthrosis NOS, lower leg Anxiety Disorder Irritable bowel syndrome Depression Bronchitis. Past History Pneumonia. Past History Menopause  Family History Cerebrovascular Accident. father Hypertension. sister Heart Disease. mother and father  Social History Illicit drug use. no Exercise. Exercises weekly; does other Marital status. divorced Living situation. live alone Drug/Alcohol Rehab (Previously). no Children. 2 Alcohol use. current drinker; drinks wine; 5-7 per week Drug/Alcohol Rehab (Currently). no Current work status. working part time Pain Contract. no Number of flights of stairs before winded. 2-3 Tobacco use. Current every day smoker. current every day smoker; smoke(d) less than 1/2 pack(s) per day Tobacco / smoke exposure. no Post-Surgical Plans. Plan is to go home. Advance Directives. Living Will, Healthcare POA  Review of Systems General:Not Present- Chills, Fever, Night Sweats, Fatigue, Weight Gain, Weight Loss and Memory Loss. Skin:Not Present- Hives, Itching, Rash, Eczema and Lesions. HEENT:Not Present- Tinnitus, Headache, Double Vision, Visual Loss, Hearing Loss and Dentures. Respiratory:Not Present- Shortness of breath with exertion, Shortness of breath at rest, Allergies, Coughing up blood and Chronic Cough. Cardiovascular:Not Present- Chest Pain, Racing/skipping heartbeats, Difficulty Breathing Lying Down, Murmur, Swelling and Palpitations. Gastrointestinal:Not Present- Bloody Stool, Heartburn, Abdominal Pain, Vomiting, Nausea, Constipation, Diarrhea, Difficulty Swallowing, Jaundice and Loss of appetitie. Female Genitourinary:Not Present- Blood in Urine, Urinary frequency,  Weak urinary stream, Discharge, Flank Pain, Incontinence, Painful Urination, Urgency, Urinary Retention and Urinating at Night. Musculoskeletal:Present- Joint Pain. Not Present-   Muscle Weakness, Muscle Pain, Joint Swelling, Back Pain, Morning Stiffness and Spasms. Neurological:Not Present- Tremor, Dizziness, Blackout spells, Paralysis, Difficulty with balance and Weakness. Psychiatric:Not Present- Insomnia.  Vitals Pulse: 84 (Regular) Resp.: 16 (Unlabored) BP: 128/78 (Sitting, Left Arm, Standard)  Physical Exam The physical exam findings are as follows: Patient is a 65 year old female with continued knee pain.   General Mental Status - Alert, cooperative and good historian. General Appearance- pleasant. Not in acute distress. Orientation- Oriented X3. Build & Nutrition- Well nourished and Well developed.   Head and Neck Head- normocephalic, atraumatic . Neck Global Assessment- supple. no bruit auscultated on the right and no bruit auscultated on the left.   Eye Pupil- Bilateral- Regular and Round. Note: reading glasses Motion- Bilateral- EOMI.   Chest and Lung Exam Auscultation: Breath sounds:- clear at anterior chest wall and - clear at posterior chest wall. Adventitious sounds:- No Adventitious sounds.   Cardiovascular Auscultation:Rhythm- Regular rate and rhythm. Heart Sounds- S1 WNL and S2 WNL. Murmurs & Other Heart Sounds:Auscultation of the heart reveals - No Murmurs.   Abdomen Palpation/Percussion:Tenderness- Abdomen is non-tender to palpation. Rigidity (guarding)- Abdomen is soft. Auscultation:Auscultation of the abdomen reveals - Bowel sounds normal.   Female Genitourinary Not done, not pertinent to present illness  Musculoskeletal She is alert and oriented. She is in no apparent distress. The right knee shows no effusion. The range is 5-120 with moderate crepitus on range of motion. There is tenderness medial and  lateral. No instability is noted. There is no Baker's cyst palpable. The lower leg edema is essentially gone now. The pulses are intact with intact sensation distally.  RADIOGRAPHS: AP and lateral of the knee taken today shows she has essentially bone on bone arthritis, medial and patellofemoral compartments. There is a sliver of space left but this is very close to bone on bone. She has osteophyte formation present also.  Assessment & Plan Osteoarthrosis Right Knee  Plan is for a Right Total Knee Replacement by Dr. Aluisio.  Plan is to go home after the hospital stay.  Drew Drue Camera, PA-C  

## 2011-08-05 NOTE — Op Note (Signed)
Pre-operative diagnosis- Osteoarthritis  Right knee(s)  Post-operative diagnosis- Osteoarthritis Right knee(s)  Procedure-  Right  Total Knee Arthroplasty  Surgeon- Gus Rankin. Rhilynn Preyer, MD  Assistant- Avel Peace, PA-C   Anesthesia-  Spinal EBL-* No blood loss amount entered *  Drains Hemovac  Tourniquet time-  Total Tourniquet Time Documented: Thigh (Right) - 30 minutes   Complications- None  Condition-PACU - hemodynamically stable.   Brief Clinical Note  Michelle [name] is a 66 y.o. year old female with end stage OA of her right knee with progressively worsening pain and dysfunction. She has constant pain, with activity and at rest and significant functional deficits with difficulties even with ADLs. She has had extensive non-op management including analgesics, injections of cortisone and viscosupplements, and home exercise program, but remains in significant pain with significant dysfunction.Radiographs show bone on bone arthritis patellofemoral. She presents now for left Total Knee Arthroplasty.    Procedure in detail---   The patient is brought into the operating room and positioned supine on the operating table. After successful administration of  Spinal,   a tourniquet is placed high on the  Right thigh(s) and the lower extremity is prepped and draped in the usual sterile fashion. Time out is performed by the operating team and then the  Right lower extremity is wrapped in Esmarch, knee flexed and the tourniquet inflated to 300 mmHg.       A midline incision is made with a ten blade through the subcutaneous tissue to the level of the extensor mechanism. A fresh blade is used to make a medial parapatellar arthrotomy. Soft tissue over the proximal medial tibia is subperiosteally elevated to the joint line with a knife and into the semimembranosus bursa with a Cobb elevator. Soft tissue over the proximal lateral tibia is elevated with attention being paid to avoiding the patellar tendon  on the tibial tubercle. The patella is everted, knee flexed 90 degrees and the ACL and PCL are removed. Findings are bone on bone patellofemoral with area of exposed bone lateral tibia and medial femur.        The drill is used to create a starting hole in the distal femur and the canal is thoroughly irrigated with sterile saline to remove the fatty contents. The 5 degree Right  valgus alignment guide is placed into the femoral canal and the distal femoral cutting block is pinned to remove 11 mm off the distal femur. Resection is made with an oscillating saw.      The tibia is subluxed forward and the menisci are removed. The extramedullary alignment guide is placed referencing proximally at the medial aspect of the tibial tubercle and distally along the second metatarsal axis and tibial crest. The block is pinned to remove 2mm off the more deficient medial  side. Resection is made with an oscillating saw. Size 2.5is the most appropriate size for the tibia and the proximal tibia is prepared with the modular drill and keel punch for that size.      The femoral sizing guide is placed and size 2.5 is most appropriate. Rotation is marked off the epicondylar axis and confirmed by creating a rectangular flexion gap at 90 degrees. The size 2.5 cutting block is pinned in this rotation and the anterior, posterior and chamfer cuts are made with the oscillating saw. The intercondylar block is then placed and that cut is made.      Trial size 2.5 tibial component, trial size 2.5 posterior stabilized femur and a 10  mm posterior stabilized rotating platform insert trial is placed. Full extension is achieved with excellent varus/valgus and anterior/posterior balance throughout full range of motion. The patella is everted and thickness measured to be 21 mm. Free hand resection is taken to 12 mm, a 32 template is placed, lug holes are drilled, trial patella is placed, and it tracks normally. Osteophytes are removed off the  posterior femur with the trial in place. All trials are removed and the cut bone surfaces prepared with pulsatile lavage. Cement is mixed and once ready for implantation, the size 2.5 tibial implant, size  2.5 posterior stabilized femoral component, and the size 32 patella are cemented in place and the patella is held with the clamp. The trial insert is placed and the knee held in full extension. All extruded cement is removed and once the cement is hard the permanent 10 mm posterior stabilized rotating platform insert is placed into the tibial tray.      The wound is copiously irrigated with saline solution and the extensor mechanism closed over a hemovac drain with #1 V-loc suture. The tourniquet is released for a total tourniquet time of 30  minutes. Flexion against gravity is 140 degrees and the patella tracks normally. Subcutaneous tissue is closed with 2.0 vicryl and subcuticular with running 4.0 Monocryl. The catheter for the Marcaine pain pump is placed and the pump is initiated. The incision is cleaned and dried and steri-strips and a bulky sterile dressing are applied. The limb is placed into a knee immobilizer and the patient is awakened and transported to recovery in stable condition.      Please note that a surgical assistant was a medical necessity for this procedure in order to perform it in a safe and expeditious manner. Surgical assistant was necessary to retract the ligaments and vital neurovascular structures to prevent injury to them and also necessary for proper positioning of the limb to allow for anatomic placement of the prosthesis.   Gus Rankin Nicanor Mendolia, MD    08/05/2011, 12:02 PM

## 2011-08-05 NOTE — Transfer of Care (Signed)
Immediate Anesthesia Transfer of Care Note  Patient: Michelle [name]  Procedure(s) Performed: Procedure(s) (LRB): TOTAL KNEE ARTHROPLASTY (Right)  Patient Location: PACU  Anesthesia Type: GA combined with regional for post-op pain  Level of Consciousness: awake, alert  and oriented  Airway & Oxygen Therapy: Patient Spontanous Breathing and Patient connected to face mask oxygen  Post-op Assessment: Report given to PACU RN and Post -op Vital signs reviewed and stable  Post vital signs: Reviewed and stable  Complications: No apparent anesthesia complications

## 2011-08-06 LAB — BASIC METABOLIC PANEL
Calcium: 8.5 mg/dL (ref 8.4–10.5)
GFR calc Af Amer: >90 mL/min (ref >90)
GFR calc non Af Amer: >90 mL/min (ref >90)
Glucose, Bld: 161 mg/dL — ABNORMAL HIGH (ref 70–99)
Sodium: 134 mEq/L — ABNORMAL LOW (ref 135–145)

## 2011-08-06 LAB — CBC
MCH: 34.7 pg — ABNORMAL HIGH (ref 26.0–34.0)
MCHC: 35 g/dL (ref 30.0–36.0)
Platelets: 221 10*3/uL (ref 150–400)
RDW: 11.9 % (ref 11.5–15.5)

## 2011-08-06 MED ORDER — MORPHINE SULFATE 2 MG/ML IJ SOLN
1.0000 mg | INTRAMUSCULAR | Status: DC | PRN
  Administered 2011-08-06: 2 mg via INTRAVENOUS
  Filled 2011-08-06: qty 1

## 2011-08-06 NOTE — Progress Notes (Signed)
Subjective: 1 Day Post-Op Procedure(s) (LRB): TOTAL KNEE ARTHROPLASTY (Right) Patient reports pain as mild.   Patient seen in rounds with Dr. Lequita Halt. Patient doing pretty well. We will start therapy today. Plan is to go home after hospital stay.  Objective: Vital signs in last 24 hours: Temp:  [97 F (36.1 C)-98.6 F (37 C)] 98.6 F (37 C) (04/16 0557) Pulse Rate:  [55-82] 71  (04/16 0557) Resp:  [10-21] 16  (04/16 0557) BP: (110-157)/(66-93) 129/78 mmHg (04/16 0557) SpO2:  [96 %-100 %] 98 % (04/16 0557) Weight:  [81.647 kg (180 lb)] 81.647 kg (180 lb) (04/15 1345)  Intake/Output from previous day:  Intake/Output Summary (Last 24 hours) at 08/06/11 0816 Last data filed at 08/06/11 0600  Gross per 24 hour  Intake   5050 ml  Output   2780 ml  Net   2270 ml    Intake/Output this shift:    Labs:  Basename 08/06/11 0405  HGB 11.4*    Basename 08/06/11 0405  WBC 11.0*  RBC 3.29*  HCT 32.6*  PLT 221    Basename 08/06/11 0405  NA 134*  K 3.9  CL 101  CO2 24  BUN 12  CREATININE 0.69  GLUCOSE 161*  CALCIUM 8.5   No results found for this basename: LABPT:2,INR:2 in the last 72 hours  Exam - Neurovascular intact Sensation intact distally Dressing - clean, dry, no drainage Motor function intact - moving foot and toes well on exam.  Hemovac pulled without difficulty.  Past Medical History  Diagnosis Date  . Chondromalacia of knee     right knee  . IBS (irritable bowel syndrome)   . Hyperlipidemia   . DDD (degenerative disc disease) 07-24-11    Osteoarthritis-s/p LTKA 2'11, now right planned  . Depression 07-24-11    tx. depression only    Assessment/Plan: 1 Day Post-Op Procedure(s) (LRB): TOTAL KNEE ARTHROPLASTY (Right) Principal Problem:  *OA (osteoarthritis) of knee   Advance diet Up with therapy Continue foley due to strict I&O and urinary output monitoring Discharge home with home health  DVT Prophylaxis - Xarelto Weight-Bearing as tolerated  to right leg Keep foley until tomorrow. No vaccines. D/C PCA Morphine, Change to IV push D/C O2 and Pulse OX and try on Room Air  Jamont Mellin 08/06/2011, 8:16 AM

## 2011-08-06 NOTE — Progress Notes (Signed)
OT Note:  Pt screened for OT.  She had previous TKA surgery 2 years ago, and has DME and verbalizes shower sequence.  She also has a Sports administrator and plans slip on shoes.  No acute needs.  [place],  [phone] 08/06/2011

## 2011-08-06 NOTE — Evaluation (Signed)
Physical Therapy Evaluation Patient Details Name: Michelle [name] MRN: [id number] DOB: February 14, 1946 Today's Date: 08/06/2011  Problem List:  Patient Active Problem List  Diagnoses  . Pain in limb  . OA (osteoarthritis) of knee    Past Medical History:  Past Medical History  Diagnosis Date  . Chondromalacia of knee     right knee  . IBS (irritable bowel syndrome)   . Hyperlipidemia   . DDD (degenerative disc disease) 07-24-11    Osteoarthritis-s/p LTKA 2'11, now right planned  . Depression 07-24-11    tx. depression only   Past Surgical History:  Past Surgical History  Procedure Date  . Joint replacement 2011    Left total knee replacement  . Appendectomy   . Cesarean section     X 2  . Tubal ligation   . Cataract extraction 07-24-11    bilateral with lens implant  . Oophorectomy 07-24-11    '94-one ovary removed    PT Assessment/Plan/Recommendation PT Assessment Clinical Impression Statement: Pt with R TKR presents with decreased R LE strength/ROM and limited functional mobility PT Recommendation/Assessment: Patient will need skilled PT in the acute care venue PT Problem List: Decreased strength;Decreased range of motion;Decreased activity tolerance;Decreased mobility;Decreased balance;Decreased knowledge of use of DME;Pain PT Therapy Diagnosis : Difficulty walking PT Plan PT Frequency: 7X/week PT Recommendation Recommendations for Other Services: OT consult Follow Up Recommendations: Home health PT Equipment Recommended: None recommended by PT PT Goals  Acute Rehab PT Goals PT Goal Formulation: With patient Time For Goal Achievement: 7 days Pt will go Supine/Side to Sit: with supervision PT Goal: Supine/Side to Sit - Progress: Goal set today Pt will go Sit to Supine/Side: with supervision PT Goal: Sit to Supine/Side - Progress: Goal set today Pt will go Sit to Stand: with supervision PT Goal: Sit to Stand - Progress: Goal set today Pt will go Stand to Sit: with  supervision PT Goal: Stand to Sit - Progress: Goal set today Pt will Ambulate: 51 - 150 feet;with supervision;with rolling walker PT Goal: Ambulate - Progress: Goal set today Pt will Go Up / Down Stairs: 1-2 stairs;with min assist;with least restrictive assistive device PT Goal: Up/Down Stairs - Progress: Goal set today Pt will Perform Home Exercise Program: with min assist PT Goal: Perform Home Exercise Program - Progress: Goal set today  PT Evaluation Precautions/Restrictions  Precautions Precautions: Knee Required Braces or Orthoses: Knee Immobilizer - Right Knee Immobilizer - Right: Discontinue once straight leg raise with < 10 degree lag Restrictions Weight Bearing Restrictions: No Other Position/Activity Restrictions: WBAT Prior Functioning  Home Living Lives With: Alone Available Help at Discharge: Family Type of Home: House Home Access: Stairs to enter Secretary/administrator of Steps: 2 Entrance Stairs-Rails: None Home Layout: One level Home Adaptive Equipment: Walker - rolling Prior Function Level of Independence: Independent Able to Take Stairs?: Yes Driving: Yes Cognition Cognition Arousal/Alertness: Awake/alert Overall Cognitive Status: Appears within functional limits for tasks assessed Orientation Level: Oriented X4 Sensation/Coordination Coordination Gross Motor Movements are Fluid and Coordinated: Yes Extremity Assessment RUE Assessment RUE Assessment: Within Functional Limits LUE Assessment LUE Assessment: Within Functional Limits RLE Assessment RLE Assessment: Exceptions to [facility] (3-/5 quad strength; -10 - 45 AAROM at knee) LLE Assessment LLE Assessment: Within Functional Limits Mobility (including Balance) Bed Mobility Bed Mobility: Yes Supine to Sit: 4: Min assist Supine to Sit Details (indicate cue type and reason): cues for sequence and min assist with R LE Transfers Transfers: Yes Sit to Stand: 4: Min assist  Sit to Stand Details (indicate  cue type and reason): cues for LE position and for UE use Stand to Sit: 4: Min assist Stand to Sit Details: cues for LE position and for UE use Ambulation/Gait Ambulation/Gait: Yes Ambulation/Gait Assistance: 4: Min assist Ambulation/Gait Assistance Details (indicate cue type and reason): cues for sequence, posture and position from RW Ambulation Distance (Feet): 48 Feet Assistive device: Rolling walker Gait Pattern: Step-to pattern    Exercise  Total Joint Exercises Ankle Circles/Pumps: AROM;10 reps;Supine;Both Quad Sets: AROM;Both;10 reps;Supine Heel Slides: AAROM;Supine;10 reps;Right Straight Leg Raises: AAROM;10 reps;Right;Supine End of Session PT - End of Session Equipment Utilized During Treatment: Gait belt;Right knee immobilizer Activity Tolerance: Patient tolerated treatment well Patient left: in chair;with call bell in reach Nurse Communication: Mobility status for ambulation;Mobility status for transfers General Behavior During Session: [facility] for tasks performed Cognition: [facility] for tasks performed  Taiwan Millon 08/06/2011, 12:20 PM

## 2011-08-06 NOTE — Progress Notes (Signed)
Physical Therapy Treatment Patient Details Name: Michelle [name] MRN: [id number] DOB: 1945/05/21 Today's Date: 08/06/2011  PT Assessment/Plan  PT - Assessment/Plan PT Plan: Discharge plan remains appropriate PT Frequency: 7X/week Recommendations for Other Services: OT consult Follow Up Recommendations: Home health PT Equipment Recommended: None recommended by PT PT Goals  Acute Rehab PT Goals PT Goal Formulation: With patient Time For Goal Achievement: 7 days Pt will go Supine/Side to Sit: with supervision PT Goal: Supine/Side to Sit - Progress: Progressing toward goal Pt will go Sit to Supine/Side: with supervision PT Goal: Sit to Supine/Side - Progress: Progressing toward goal Pt will go Sit to Stand: with supervision PT Goal: Sit to Stand - Progress: Progressing toward goal Pt will go Stand to Sit: with supervision PT Goal: Stand to Sit - Progress: Progressing toward goal Pt will Ambulate: 51 - 150 feet;with supervision;with rolling walker PT Goal: Ambulate - Progress: Progressing toward goal Pt will Go Up / Down Stairs: 1-2 stairs;with min assist;with least restrictive assistive device PT Goal: Up/Down Stairs - Progress: Goal set today  PT Treatment Precautions/Restrictions  Precautions Precautions: Knee Required Braces or Orthoses: Knee Immobilizer - Right Knee Immobilizer - Right: Discontinue once straight leg raise with < 10 degree lag Restrictions Weight Bearing Restrictions: No Other Position/Activity Restrictions: WBAT Mobility (including Balance) Bed Mobility Supine to Sit: 4: Min assist Supine to Sit Details (indicate cue type and reason): cues for sequence and min assist with R LE Sit to Supine: 4: Min assist Sit to Supine - Details (indicate cue type and reason): cues for sequence and min assist with R LE Transfers Sit to Stand: 4: Min assist Sit to Stand Details (indicate cue type and reason): cues for LE position and for UE use Stand to Sit: 4: Min  assist Stand to Sit Details: cues for LE position and for UE use Ambulation/Gait Ambulation/Gait Assistance: 4: Min assist Ambulation/Gait Assistance Details (indicate cue type and reason): cues for sequence, posture and position from RW Ambulation Distance (Feet): 108 Feet Assistive device: Rolling walker Gait Pattern: Step-to pattern    Exercise    End of Session PT - End of Session Equipment Utilized During Treatment: Gait belt;Right knee immobilizer Activity Tolerance: Patient tolerated treatment well Patient left: in bed;with call bell in reach Nurse Communication: Mobility status for ambulation;Mobility status for transfers General Behavior During Session: [facility] for tasks performed Cognition: [facility] for tasks performed  Kevron Patella 08/06/2011, 3:22 PM

## 2011-08-06 NOTE — Progress Notes (Signed)
Utilization review completed.  

## 2011-08-07 DIAGNOSIS — E871 Hypo-osmolality and hyponatremia: Secondary | ICD-10-CM | POA: Diagnosis not present

## 2011-08-07 LAB — CBC
MCH: 34.3 pg — ABNORMAL HIGH (ref 26.0–34.0)
Platelets: 182 10*3/uL (ref 150–400)
RBC: 3 MIL/uL — ABNORMAL LOW (ref 3.87–5.11)
WBC: 10.3 10*3/uL (ref 4.0–10.5)

## 2011-08-07 LAB — BASIC METABOLIC PANEL
CO2: 26 mEq/L (ref 19–32)
Calcium: 8.3 mg/dL — ABNORMAL LOW (ref 8.4–10.5)
Chloride: 102 mEq/L (ref 96–112)
Potassium: 3.6 mEq/L (ref 3.5–5.1)
Sodium: 137 mEq/L (ref 135–145)

## 2011-08-07 NOTE — Progress Notes (Signed)
Subjective: 2 Days Post-Op Procedure(s) (LRB): TOTAL KNEE ARTHROPLASTY (Right) Patient reports pain as mild and moderate.   Patient seen in rounds with Dr. Lequita Halt. Patient hurting this morning.  Did pretty well with therapy yesterday.  Objective: Vital signs in last 24 hours: Temp:  [98 F (36.7 C)-98.4 F (36.9 C)] 98 F (36.7 C) (04/17 0600) Pulse Rate:  [78-94] 86  (04/17 0600) Resp:  [16-18] 18  (04/17 0600) BP: (123-130)/(76-81) 123/81 mmHg (04/17 0600) SpO2:  [95 %-99 %] 95 % (04/17 0600)  Intake/Output from previous day:  Intake/Output Summary (Last 24 hours) at 08/07/11 1021 Last data filed at 08/07/11 0600  Gross per 24 hour  Intake    980 ml  Output   2150 ml  Net  -1170 ml    Labs:  Basename 08/07/11 0400 08/06/11 0405  HGB 10.3* 11.4*    Basename 08/07/11 0400 08/06/11 0405  WBC 10.3 11.0*  RBC 3.00* 3.29*  HCT 30.6* 32.6*  PLT 182 221    Basename 08/07/11 0400 08/06/11 0405  NA 137 134*  K 3.6 3.9  CL 102 101  CO2 26 24  BUN 12 12  CREATININE 0.72 0.69  GLUCOSE 142* 161*  CALCIUM 8.3* 8.5   No results found for this basename: LABPT:2,INR:2 in the last 72 hours  Exam - Neurovascular intact Sensation intact distally Dressing/Incision - clean, dry, no drainage, healing Motor function intact - moving foot and toes well on exam.   Past Medical History  Diagnosis Date  . Chondromalacia of knee     right knee  . IBS (irritable bowel syndrome)   . Hyperlipidemia   . DDD (degenerative disc disease) 07-24-11    Osteoarthritis-s/p LTKA 2'11, now right planned  . Depression 07-24-11    tx. depression only    Assessment/Plan: 2 Days Post-Op Procedure(s) (LRB): TOTAL KNEE ARTHROPLASTY (Right) Principal Problem:  *OA (osteoarthritis) of knee Active Problems:  Postop Hyponatremia   Up with therapy Plan for discharge tomorrow Discharge home with home health  DVT Prophylaxis - Xarelto Weight-Bearing as tolerated to right leg  Elkin Belfield,  Jaedyn Lard 08/07/2011, 10:21 AM

## 2011-08-07 NOTE — Progress Notes (Signed)
Physical Therapy Treatment Patient Details Name: JOANE POSTEL MRN: [id number] DOB: [birth year] Today's Date: 08/07/2011  PT Assessment/Plan  PT - Assessment/Plan Comments on Treatment Session: pt very motivated and looking forward to d/c in am PT Plan: Discharge plan remains appropriate PT Frequency: 7X/week Recommendations for Other Services: OT consult Follow Up Recommendations: Home health PT Equipment Recommended: None recommended by PT PT Goals  Acute Rehab PT Goals PT Goal Formulation: With patient Time For Goal Achievement: 7 days Pt will go Sit to Supine/Side: with supervision PT Goal: Sit to Supine/Side - Progress: Progressing toward goal Pt will go Sit to Stand: with supervision PT Goal: Sit to Stand - Progress: Progressing toward goal Pt will go Stand to Sit: with supervision PT Goal: Stand to Sit - Progress: Progressing toward goal Pt will Ambulate: 51 - 150 feet;with supervision;with rolling walker PT Goal: Ambulate - Progress: Progressing toward goal  PT Treatment Precautions/Restrictions  Precautions Precautions: Knee Required Braces or Orthoses: Knee Immobilizer - Right Knee Immobilizer - Right: Discontinue once straight leg raise with < 10 degree lag Restrictions Weight Bearing Restrictions: No Other Position/Activity Restrictions: WBAT Mobility (including Balance) Bed Mobility Sit to Supine: 4: Min assist Sit to Supine - Details (indicate cue type and reason): min cues for sequence, min assist with R LE Transfers Sit to Stand: 4: Min assist;5: Supervision Sit to Stand Details (indicate cue type and reason): cues for use of UEs Stand to Sit: 5: Supervision;4: Min assist Stand to Sit Details: cues for position from bed and LE managment Ambulation/Gait Ambulation/Gait Assistance: 4: Min assist;5: Supervision Ambulation/Gait Assistance Details (indicate cue type and reason): cues for posture and position from RW Ambulation Distance (Feet): 156  Feet Assistive device: Rolling walker Gait Pattern: Step-to pattern    Exercise    End of Session PT - End of Session Equipment Utilized During Treatment: Gait belt;Right knee immobilizer Activity Tolerance: Patient tolerated treatment well Patient left: in bed;with call bell in reach Nurse Communication: Mobility status for ambulation;Mobility status for transfers General Behavior During Session: [facility] for tasks performed Cognition: [facility] for tasks performed  Jyrah Blye 08/07/2011, 4:24 PM

## 2011-08-07 NOTE — Progress Notes (Signed)
Physical Therapy Treatment Patient Details Name: Michelle [name] MRN: [id number] DOB: 06-09-1945 Today's Date: 08/07/2011  PT Assessment/Plan  PT - Assessment/Plan Comments on Treatment Session: OOB deferred - pt states she is not to walk until this pm per Dr Despina Hick PT Plan: Discharge plan remains appropriate PT Frequency: 7X/week Recommendations for Other Services: OT consult Follow Up Recommendations: Home health PT Equipment Recommended: None recommended by PT PT Goals  Acute Rehab PT Goals PT Goal Formulation: With patient  PT Treatment Precautions/Restrictions  Precautions Precautions: Knee Required Braces or Orthoses: Knee Immobilizer - Right Knee Immobilizer - Right: Discontinue once straight leg raise with < 10 degree lag Restrictions Weight Bearing Restrictions: No Other Position/Activity Restrictions: WBAT Mobility (including Balance)      Exercise  Total Joint Exercises Ankle Circles/Pumps: AROM;20 reps;Supine;Both Quad Sets: AROM;20 reps;Both;Supine Heel Slides: AAROM;20 reps;Supine;Both Straight Leg Raises: AAROM;20 reps;Right;Supine End of Session PT - End of Session Activity Tolerance: Patient tolerated treatment well Patient left: in bed;with call bell in reach Nurse Communication: Mobility status for ambulation;Mobility status for transfers General Behavior During Session: [facility] for tasks performed Cognition: [facility] for tasks performed  Michelle [name] 08/07/2011, 2:03 PM

## 2011-08-08 LAB — CBC
MCV: 100.7 fL — ABNORMAL HIGH (ref 78.0–100.0)
Platelets: 194 10*3/uL (ref 150–400)
RBC: 3.01 MIL/uL — ABNORMAL LOW (ref 3.87–5.11)
WBC: 9.6 10*3/uL (ref 4.0–10.5)

## 2011-08-08 MED ORDER — METHOCARBAMOL 500 MG PO TABS: 500.0000 mg | ORAL_TABLET | Freq: Four times a day (QID) | ORAL | Status: AC | PRN

## 2011-08-08 MED ORDER — OXYCODONE HCL 5 MG PO TABS: 5.0000 mg | ORAL_TABLET | ORAL | Status: AC | PRN

## 2011-08-08 MED ORDER — RIVAROXABAN 10 MG PO TABS: 10.0000 mg | ORAL_TABLET | Freq: Every day | ORAL | Status: DC

## 2011-08-08 NOTE — Progress Notes (Signed)
Physical Therapy Treatment Patient Details Name: DOMINGUE COLTRAIN MRN: [id number] DOB: 07/30/1945 Today's Date: 08/08/2011  PT Assessment/Plan  PT - Assessment/Plan Comments on Treatment Session: Reviewed car tranfers.  Pt feels ready for d/c home PT Plan: Discharge plan remains appropriate PT Frequency: 7X/week Recommendations for Other Services: OT consult Follow Up Recommendations: Home health PT Equipment Recommended: None recommended by PT PT Goals  Acute Rehab PT Goals PT Goal Formulation: With patient Time For Goal Achievement: 7 days Pt will go Supine/Side to Sit: with supervision PT Goal: Supine/Side to Sit - Progress: Progressing toward goal Pt will go Sit to Supine/Side: with supervision PT Goal: Sit to Supine/Side - Progress: Progressing toward goal Pt will go Sit to Stand: with supervision PT Goal: Sit to Stand - Progress: Progressing toward goal Pt will go Stand to Sit: with supervision PT Goal: Stand to Sit - Progress: Met Pt will Ambulate: 51 - 150 feet;with supervision;with rolling walker PT Goal: Ambulate - Progress: Met Pt will Go Up / Down Stairs: 1-2 stairs;with min assist;with least restrictive assistive device PT Goal: Up/Down Stairs - Progress: Met  PT Treatment Precautions/Restrictions  Precautions Precautions: Knee Required Braces or Orthoses: Knee Immobilizer - Right Knee Immobilizer - Right: Discontinue once straight leg raise with < 10 degree lag Restrictions Weight Bearing Restrictions: No Other Position/Activity Restrictions: WBAT Mobility (including Balance) Transfers Sit to Stand: 5: Supervision;4: Min assist Sit to Stand Details (indicate cue type and reason): min cues for use of UEs Stand to Sit: 5: Supervision;4: Min assist Stand to Sit Details: min cues for LE management Ambulation/Gait Ambulation/Gait Assistance: 5: Supervision;4: Min assist Ambulation/Gait Assistance Details (indicate cue type and reason): cues for posture and  position from RW Ambulation Distance (Feet): 189 Feet Assistive device: Rolling walker Gait Pattern: Step-to pattern Stairs: Yes Stairs Assistance: 4: Min assist Stairs Assistance Details (indicate cue type and reason): cues for sequence and for foot/RW placement Stair Management Technique: No rails;Backwards;With walker;Step to pattern Number of Stairs: 2  (performed 3 times)    Exercise  Total Joint Exercises Ankle Circles/Pumps: AROM;20 reps;Supine;Both Quad Sets: AROM;20 reps;Both;Supine Heel Slides: AAROM;20 reps;Supine;Both Straight Leg Raises: AAROM;20 reps;Right;Supine End of Session PT - End of Session Equipment Utilized During Treatment: Right knee immobilizer Activity Tolerance: Patient tolerated treatment well Patient left: with call bell in reach;in chair;with family/visitor present Nurse Communication: Mobility status for ambulation;Mobility status for transfers General Behavior During Session: [facility] for tasks performed Cognition: [facility] for tasks performed  Lakyra Tippins 08/08/2011, 2:45 PM

## 2011-08-08 NOTE — Discharge Summary (Signed)
Physician Discharge Summary   Patient ID: RAYA MCKINSTRY MRN: [id number] DOB/AGE: [birth year] 66 y.o.  Admit date: 08/05/2011 Discharge date: 08/08/2011  Primary Diagnosis: Osteoarthrosis Right Knee  Admission Diagnoses:  Past Medical History  Diagnosis Date  . Chondromalacia of knee     right knee  . IBS (irritable bowel syndrome)   . Hyperlipidemia   . DDD (degenerative disc disease) 07-24-11    Osteoarthritis-s/p LTKA 2'11, now right planned  . Depression 07-24-11    tx. depression only   Discharge Diagnoses:   Principal Problem:  *OA (osteoarthritis) of knee Active Problems:  Postop Hyponatremia  Procedure:  Procedure(s) (LRB): TOTAL KNEE ARTHROPLASTY (Right)   Consults: None  HPI: Michelle [name] is a 66 y.o. year old female with end stage OA of her right knee with progressively worsening pain and dysfunction. She has constant pain, with activity and at rest and significant functional deficits with difficulties even with ADLs. She has had extensive non-op management including analgesics, injections of cortisone and viscosupplements, and home exercise program, but remains in significant pain with significant dysfunction.Radiographs show bone on bone arthritis patellofemoral. She presents now for left Total Knee Arthroplasty.   Laboratory Data: Hospital Outpatient Visit on 07/24/2011  Component Date Value Range Status  . MRSA, PCR  07/24/2011 INVALID RESULTS, SPECIMEN SENT FOR CULTURE* NEGATIVE Final  . Staphylococcus aureus  07/24/2011 INVALID RESULTS, SPECIMEN SENT FOR CULTURE* NEGATIVE Final   Comment: K ROBERTS AT 1050 ON 04.03.2013 BY NBROOKS                                                          The Xpert SA Assay (FDA                          approved for NASAL specimens                          only), is one component of                          a comprehensive surveillance                          program.  It is not intended                          to  diagnose infection nor to                          guide or monitor treatment.  Marland Kitchen aPTT (seconds) 07/24/2011 27  24-37 Final  . WBC (K/uL) 07/24/2011 6.9  4.0-10.5 Final  . RBC (MIL/uL) 07/24/2011 4.21  3.87-5.11 Final  . Hemoglobin (g/dL) 40/98/1191 47.8  29.5-62.1 Final  . HCT (%) 07/24/2011 42.7  36.0-46.0 Final  . MCV (fL) 07/24/2011 101.4* 78.0-100.0 Final  . MCH (pg) 07/24/2011 34.9* 26.0-34.0 Final  . MCHC (g/dL) 30/86/5784 69.6  29.5-28.4 Final  . RDW (%) 07/24/2011 12.1  11.5-15.5 Final  . Platelets (K/uL) 07/24/2011 262  150-400 Final  . Sodium (mEq/L) 07/24/2011 139  135-145 Final  . Potassium (mEq/L) 07/24/2011 4.3  3.5-5.1  Final  . Chloride (mEq/L) 07/24/2011 103  96-112 Final  . CO2 (mEq/L) 07/24/2011 27  19-32 Final  . Glucose, Bld (mg/dL) 16/01/9603 540* 98-11 Final  . BUN (mg/dL) 91/47/8295 21  6-21 Final  . Creatinine, Ser (mg/dL) 30/86/5784 6.96  2.95-2.84 Final  . Calcium (mg/dL) 13/24/4010 9.2  2.7-25.3 Final  . Total Protein (g/dL) 66/44/0347 7.1  4.2-5.9 Final  . Albumin (g/dL) 56/38/7564 3.9  3.3-2.9 Final  . AST (U/L) 07/24/2011 19  0-37 Final  . ALT (U/L) 07/24/2011 29  0-35 Final  . Alkaline Phosphatase (U/L) 07/24/2011 81  39-117 Final  . Total Bilirubin (mg/dL) 51/88/4166 0.4  0.6-3.0 Final  . GFR calc non Af Amer (mL/min) 07/24/2011 89* >90 Final  . GFR calc Af Amer (mL/min) 07/24/2011 >90  >90 Final   Comment:                                 The eGFR has been calculated                          using the CKD EPI equation.                          This calculation has not been                          validated in all clinical                          situations.                          eGFR's persistently                          <90 mL/min signify                          possible Chronic Kidney Disease.  Marland Kitchen Prothrombin Time (seconds) 07/24/2011 12.5  11.6-15.2 Final  . INR  07/24/2011 0.91  0.00-1.49 Final  . Color, Urine  07/24/2011 YELLOW   YELLOW Final  . APPearance  07/24/2011 CLEAR  CLEAR Final  . Specific Gravity, Urine  07/24/2011 1.028  1.005-1.030 Final  . pH  07/24/2011 5.0  5.0-8.0 Final  . Glucose, UA (mg/dL) 16/04/930 NEGATIVE  NEGATIVE Final  . Hgb urine dipstick  07/24/2011 NEGATIVE  NEGATIVE Final  . Bilirubin Urine  07/24/2011 NEGATIVE  NEGATIVE Final  . Ketones, ur (mg/dL) 35/57/3220 NEGATIVE  NEGATIVE Final  . Protein, ur (mg/dL) 25/42/7062 NEGATIVE  NEGATIVE Final  . Urobilinogen, UA (mg/dL) 37/62/8315 0.2  1.7-6.1 Final  . Nitrite  07/24/2011 NEGATIVE  NEGATIVE Final  . Leukocytes, UA  07/24/2011 NEGATIVE  NEGATIVE Final   MICROSCOPIC NOT DONE ON URINES WITH NEGATIVE PROTEIN, BLOOD, LEUKOCYTES, NITRITE, OR GLUCOSE <1000 mg/dL.  Marland Kitchen Specimen Description  07/24/2011 NOSE   Final  . Special Requests  07/24/2011 A   Final  . Culture  07/24/2011    Final                   Value:NO STAPHYLOCOCCUS AUREUS ISOLATED  Note: NOMRSA  . Report Status  07/24/2011 07/26/2011 FINAL   Final    Basename 08/08/11 0425 08/07/11 0400 08/06/11 0405  HGB 10.3* 10.3* 11.4*    Basename 08/08/11 0425 08/07/11 0400  WBC 9.6 10.3  RBC 3.01* 3.00*  HCT 30.3* 30.6*  PLT 194 182    Basename 08/07/11 0400 08/06/11 0405  NA 137 134*  K 3.6 3.9  CL 102 101  CO2 26 24  BUN 12 12  CREATININE 0.72 0.69  GLUCOSE 142* 161*  CALCIUM 8.3* 8.5   No results found for this basename: LABPT:2,INR:2 in the last 72 hours  X-Rays:No results found.  EKG: Orders placed during the hospital encounter of 07/24/11  . EKG 12-LEAD  . EKG 12-LEAD     Hospital Course: Patient was admitted to [facility] and taken to the OR and underwent the above state procedure without complications.  Patient tolerated the procedure well and was later transferred to the recovery room and then to the orthopaedic floor for postoperative care.  They were given PO and IV analgesics for pain control following their surgery.  They  were given 24 hours of postoperative antibiotics and started on DVT prophylaxis in the form of Xarelto.   PT and OT were ordered for total joint protocol.  Discharge planning consulted to help with postop disposition and equipment needs.  Patient had a decent  night on the evening of surgery and started to get up OOB with therapy on day one.  PCA was discontinued and they were weaned over to PO meds.  Hemovac drain was pulled without difficulty.  Continued to work with therapy into day two despite having some increased pain.  Dressing was changed on day two and the incision was healing well.  By day three, the patient had progressed with therapy and meeting their goals.  Incision was healing well.  Patient was seen in rounds and was ready to go home.  Discharge Medications: Prior to Admission medications   Medication Sig Start Date End Date Taking? Authorizing Provider  Sertraline HCl (ZOLOFT PO) Take 100 mg by mouth daily after breakfast.    Yes Historical Provider, MD  methocarbamol (ROBAXIN) 500 MG tablet Take 1 tablet (500 mg total) by mouth every 6 (six) hours as needed. 08/08/11 08/18/11  Liddy Deam, PA  oxyCODONE (OXY IR/ROXICODONE) 5 MG immediate release tablet Take 1-2 tablets (5-10 mg total) by mouth every 3 (three) hours as needed. 08/08/11 08/18/11  Meliyah Simon Julien Girt, PA  rivaroxaban (XARELTO) 10 MG TABS tablet Take 1 tablet (10 mg total) by mouth daily with breakfast. 08/08/11   Vanna Sailer Julien Girt, PA    Diet: regular Activity:WBAT Follow-up:in 2 weeks Disposition - Home Discharged Condition: good   Discharge Orders    Future Orders Please Complete By Expires   Diet - low sodium heart healthy      Call MD / Call 911      Comments:   If you experience chest pain or shortness of breath, CALL 911 and be transported to the hospital emergency room.  If you develope a fever above 101 F, pus (white drainage) or increased drainage or redness at the wound, or calf pain, call your  surgeon's office.   Constipation Prevention      Comments:   Drink plenty of fluids.  Prune juice may be helpful.  You may use a stool softener, such as Colace (over the counter) 100 mg twice a day.  Use MiraLax (over the counter) for constipation  as needed.   Increase activity slowly as tolerated      Weight Bearing as taught in Physical Therapy      Comments:   Use a walker or crutches as instructed.   Discharge instructions      Comments:   Pick up stool softner and laxative for home. Do not submerge incision under water. May shower. Continue to use ice for pain and swelling from surgery.    Driving restrictions      Comments:   No driving   Lifting restrictions      Comments:   No lifting   TED hose      Comments:   Use stockings (TED hose) for 3 weeks on both leg(s).  You may remove them at night for sleeping.   Change dressing      Comments:   Change dressing daily with sterile 4 x 4 inch gauze dressing and apply TED hose.   Do not put a pillow under the knee. Place it under the heel.        Medication List  As of 08/08/2011  9:02 AM   STOP taking these medications         HYDROcodone-acetaminophen 5-325 MG per tablet         TAKE these medications         methocarbamol 500 MG tablet   Commonly known as: ROBAXIN   Take 1 tablet (500 mg total) by mouth every 6 (six) hours as needed.      oxyCODONE 5 MG immediate release tablet   Commonly known as: Oxy IR/ROXICODONE   Take 1-2 tablets (5-10 mg total) by mouth every 3 (three) hours as needed.      rivaroxaban 10 MG Tabs tablet   Commonly known as: XARELTO   Take 1 tablet (10 mg total) by mouth daily with breakfast.      ZOLOFT PO   Take 100 mg by mouth daily after breakfast.           Follow-up Information    Follow up with Loanne Drilling, MD. Schedule an appointment as soon as possible for a visit in 2 weeks.   Contact information:   [facility] [address] [place] [address] [phone]          Signed: Patrica Duel 08/08/2011, 9:02 AM

## 2011-08-08 NOTE — Progress Notes (Signed)
Currently unable to copy and paste entire Information sheet from [place] record.

## 2011-08-08 NOTE — Progress Notes (Signed)
CM spoke with patient. Pt states she will return to her home in [place] where friend will be caregiver . Pt already has commode seat, shower chair, RW and cane. 08/08/2011 Raynelle Bring BSN CCM [phone] Pt is requesting [facility] for [facility] services. has used them in the past. Services pre-arranged prior to admission. List of home health agencies placed in shadow chart. Plans are for discharge today with start of [facility] services tomorrow 08/09/2011.

## 2011-08-08 NOTE — Discharge Instructions (Signed)
Knee Rehabilitation, Guidelines Following Surgery Results after knee surgery are often greatly improved when you follow the exercise, range of motion and muscle strengthening exercises prescribed by your doctor. Safety measures are also important to protect the knee from further injury. Any time any of these exercises cause you to have increased pain or swelling in your knee joint, decrease the amount until you are comfortable again and slowly increase them. If you have problems or questions, call your caregiver or physical therapist for advice. HOME CARE INSTRUCTIONS   Remove items at home which could result in a fall. This includes throw rugs or furniture in walking pathways.   Continue medications as instructed.   You may shower or take tub baths when your staples or stitches are removed or as instructed.   Walk using crutches or walker as instructed.   Put weight on your legs and walk as much as is comfortable.   You may resume a sexual relationship in one month or when given the OK by your doctor.   Return to work as instructed by your doctor.   Do not drive a car for 6 weeks or as instructed.   Wear elastic stockings until instructed not to.   Make sure you keep all of your appointments after your operation with all of your doctors and caregivers.  RANGE OF MOTION AND STRENGTHENING EXERCISES Rehabilitation of the knee is important following a knee injury or an operation. After just a few days of immobilization, the muscles of the thigh which control the knee become weakened and shrink (atrophy). Knee exercises are designed to build up the tone and strength of the thigh muscles and to improve knee motion. Often times heat used for twenty to thirty minutes before working out will loosen up your tissues and help with improving the range of motion. These exercises can be done on a training (exercise) mat, on the floor, on a table or on a bed. Use what ever works the best and is most  comfortable for you Knee exercises include:  Leg Lifts - While your knee is still immobilized in a splint or cast, you can do straight leg raises. Lift the leg to 60 degrees, hold for 3 sec, and slowly lower the leg. Repeat 10-20 times 2-3 times daily. Perform this exercise against resistance later as your knee gets better.   Quad and Hamstring Sets - Tighten up the muscle on the front of the thigh (Quad) and hold for 5-10 sec. Repeat this 10-20 times hourly. Hamstring sets are done by pushing the foot backward against an object and holding for 5-10 sec. Repeat as with quad sets.  A rehabilitation program following serious knee injuries can speed recovery and prevent re-injury in the future due to weakened muscles. Contact your doctor or a physical therapist for more information on knee rehabilitation. MAKE SURE YOU:   Understand these instructions.   Will watch your condition.   Will get help right away if you are not doing well or get worse.  Document Released: 04/08/2005 Document Revised: 03/28/2011 Document Reviewed: 09/26/2006 [employer] Patient Information 2012 [employer].  Pick up stool softner and laxative for home. Do not submerge incision under water. May shower. Continue to use ice for pain and swelling from surgery.  

## 2011-08-08 NOTE — Progress Notes (Signed)
Subjective: 3 Days Post-Op Procedure(s) (LRB): TOTAL KNEE ARTHROPLASTY (Right) Patient reports pain as mild.   Patient seen in rounds with Dr. Lequita Halt. Patient doing better today and ready to go home.  Objective: Vital signs in last 24 hours: Temp:  [98.7 F (37.1 C)-99.3 F (37.4 C)] 99 F (37.2 C) (04/18 0530) Pulse Rate:  [86-94] 86  (04/18 0530) Resp:  [16-18] 16  (04/18 0530) BP: (122-145)/(75-82) 122/75 mmHg (04/18 0530) SpO2:  [94 %-98 %] 94 % (04/18 0530)  Intake/Output from previous day:  Intake/Output Summary (Last 24 hours) at 08/08/11 0857 Last data filed at 08/08/11 0530  Gross per 24 hour  Intake   1200 ml  Output   1100 ml  Net    100 ml    Intake/Output this shift:    Labs:  Basename 08/08/11 0425 08/07/11 0400 08/06/11 0405  HGB 10.3* 10.3* 11.4*    Basename 08/08/11 0425 08/07/11 0400  WBC 9.6 10.3  RBC 3.01* 3.00*  HCT 30.3* 30.6*  PLT 194 182    Basename 08/07/11 0400 08/06/11 0405  NA 137 134*  K 3.6 3.9  CL 102 101  CO2 26 24  BUN 12 12  CREATININE 0.72 0.69  GLUCOSE 142* 161*  CALCIUM 8.3* 8.5   No results found for this basename: LABPT:2,INR:2 in the last 72 hours  Exam: Neurovascular intact Sensation intact distally Incision - clean, dry, no drainage, healing Motor function intact - moving foot and toes well on exam.   Assessment/Plan: 3 Days Post-Op Procedure(s) (LRB): TOTAL KNEE ARTHROPLASTY (Right) Procedure(s) (LRB): TOTAL KNEE ARTHROPLASTY (Right) Past Medical History  Diagnosis Date  . Chondromalacia of knee     right knee  . IBS (irritable bowel syndrome)   . Hyperlipidemia   . DDD (degenerative disc disease) 07-24-11    Osteoarthritis-s/p LTKA 2'11, now right planned  . Depression 07-24-11    tx. depression only   Principal Problem:  *OA (osteoarthritis) of knee Active Problems:  Postop Hyponatremia   Discharge home with home health Diet - regular Follow up - in 2 weeks Activity - WBAT Disposition -  Home Condition Upon Discharge - Good D/C Meds - See DC Summary DVT Prophylaxis - Xarelto  Michelle [name] 08/08/2011, 8:57 AM

## 2011-08-15 ENCOUNTER — Encounter (HOSPITAL_COMMUNITY): Payer: Self-pay | Admitting: Orthopedic Surgery

## 2012-02-17 ENCOUNTER — Encounter (HOSPITAL_COMMUNITY): Payer: Self-pay | Admitting: *Deleted

## 2012-02-17 ENCOUNTER — Emergency Department (HOSPITAL_COMMUNITY): Payer: BC Managed Care – PPO

## 2012-02-17 ENCOUNTER — Emergency Department (HOSPITAL_COMMUNITY)
Admission: EM | Admit: 2012-02-17 | Discharge: 2012-02-17 | Disposition: A | Discharge status: Home / Self Care | Payer: BC Managed Care – PPO | Attending: Emergency Medicine | Admitting: Emergency Medicine

## 2012-02-17 DIAGNOSIS — R079 Chest pain, unspecified: Secondary | ICD-10-CM

## 2012-02-17 DIAGNOSIS — R059 Cough, unspecified: Secondary | ICD-10-CM | POA: Insufficient documentation

## 2012-02-17 DIAGNOSIS — M2240 Chondromalacia patellae, unspecified knee: Secondary | ICD-10-CM | POA: Insufficient documentation

## 2012-02-17 DIAGNOSIS — F3289 Other specified depressive episodes: Secondary | ICD-10-CM | POA: Insufficient documentation

## 2012-02-17 DIAGNOSIS — IMO0002 Reserved for concepts with insufficient information to code with codable children: Secondary | ICD-10-CM | POA: Insufficient documentation

## 2012-02-17 DIAGNOSIS — E782 Mixed hyperlipidemia: Secondary | ICD-10-CM | POA: Insufficient documentation

## 2012-02-17 DIAGNOSIS — Z7982 Long term (current) use of aspirin: Secondary | ICD-10-CM | POA: Insufficient documentation

## 2012-02-17 DIAGNOSIS — F17200 Nicotine dependence, unspecified, uncomplicated: Secondary | ICD-10-CM | POA: Insufficient documentation

## 2012-02-17 DIAGNOSIS — F329 Major depressive disorder, single episode, unspecified: Secondary | ICD-10-CM | POA: Insufficient documentation

## 2012-02-17 DIAGNOSIS — R42 Dizziness and giddiness: Secondary | ICD-10-CM | POA: Insufficient documentation

## 2012-02-17 DIAGNOSIS — R05 Cough: Secondary | ICD-10-CM | POA: Insufficient documentation

## 2012-02-17 DIAGNOSIS — Z79899 Other long term (current) drug therapy: Secondary | ICD-10-CM | POA: Insufficient documentation

## 2012-02-17 DIAGNOSIS — K589 Irritable bowel syndrome without diarrhea: Secondary | ICD-10-CM | POA: Insufficient documentation

## 2012-02-17 LAB — CBC
HCT: 41.3 % (ref 36.0–46.0)
Hemoglobin: 14.9 g/dL (ref 12.0–15.0)
MCHC: 36.1 g/dL — ABNORMAL HIGH (ref 30.0–36.0)
WBC: 7 10*3/uL (ref 4.0–10.5)

## 2012-02-17 LAB — POCT I-STAT TROPONIN I: Troponin i, poc: 0.01 ng/mL (ref 0.00–0.08)

## 2012-02-17 LAB — BASIC METABOLIC PANEL
BUN: 23 mg/dL (ref 6–23)
Chloride: 100 mEq/L (ref 96–112)
Glucose, Bld: 107 mg/dL — ABNORMAL HIGH (ref 70–99)
Potassium: 4.2 mEq/L (ref 3.5–5.1)

## 2012-02-17 MED ORDER — ASPIRIN 81 MG PO CHEW
162.0000 mg | CHEWABLE_TABLET | Freq: Once | ORAL | Status: AC
  Administered 2012-02-17: 162 mg via ORAL
  Filled 2012-02-17: qty 2

## 2012-02-17 MED ORDER — NITROGLYCERIN 0.4 MG SL SUBL
0.4000 mg | SUBLINGUAL_TABLET | SUBLINGUAL | Status: DC | PRN
  Administered 2012-02-17 (×2): 0.4 mg via SUBLINGUAL
  Filled 2012-02-17: qty 25

## 2012-02-17 MED ORDER — ASPIRIN 81 MG PO CHEW: 324.0000 mg | CHEWABLE_TABLET | Freq: Once | ORAL | Status: DC

## 2012-02-17 NOTE — ED Notes (Signed)
Pt feels better after taking nitro x 2. Pain level is a 0.

## 2012-02-17 NOTE — ED Provider Notes (Signed)
History     CSN: [id number]  Arrival date & time 02/17/12  1245   First MD Initiated Contact with Patient 02/17/12 1406      Chief Complaint  Patient presents with  . Chest Pain    (Consider location/radiation/quality/duration/timing/severity/associated sxs/prior treatment) Patient is a 66 y.o. female presenting with chest pain.  Chest Pain Primary symptoms include cough. Pertinent negatives for primary symptoms include no shortness of breath and no palpitations.  Pertinent negatives for associated symptoms include no weakness.  Her past medical history is significant for anxiety/panic attacks, CAD, cancer and hyperlipidemia.  Pertinent negatives for past medical history include no aneurysm, no aortic dissection, no arrhythmia, no connective tissue disease, no COPD, no CHF, no diabetes, no DVT, no hypertension, no MI, no PE, no recent injury, no rheumatic fever, no seizures, no sleep apnea and no strokes.  Her family medical history is significant for CAD in family, diabetes in family, heart disease in family, hypertension in family and stroke in family.  Pertinent negatives for family medical history include: no hyperlipidemia in family, no early MI in family, no PE in family, no sickle cell disease in family, no sudden death in family and no TIA in family.  Procedure history is positive for echocardiogram, stress echo and exercise treadmill test.  Procedure history is negative for cardiac catheterization, persantine thallium and stress thallium.    66 year old female patient of Dr. Tanja Port coming in with midsternal chest pain radiating to left neck and shoulder since 1:30 AM when it woke her this morning. States that before she went to bed at 11:30 she did have some indigestion. States that she was not short of breath or nausea but she did get sweaty with complaint of hands. She describes the pain as sharp waves of pain in the L chest constant pain as well. Her pain as 4/10 presently.  States that when she went to work today she did get dizzy around 12 noon and she feels lightheaded female. She does not like the room is spinning. She has had a negative stress test about 10 years ago after she took the fem-fem. This denies calf pain long trips shortness of breath. She is a smoker and has high cholesterol as well. Mother and father both had heart valve replacement but no heart attack. Normal stress test in 1997.   Past Medical History  Diagnosis Date  . Chondromalacia of knee     right knee  . IBS (irritable bowel syndrome)   . Hyperlipidemia   . DDD (degenerative disc disease) 07-24-11    Osteoarthritis-s/p LTKA 2'11, now right planned  . Depression 07-24-11    tx. depression only    Past Surgical History  Procedure Date  . Joint replacement 2011    Left total knee replacement  . Appendectomy   . Cesarean section     X 2  . Tubal ligation   . Cataract extraction 07-24-11    bilateral with lens implant  . Oophorectomy 07-24-11    '94-one ovary removed  . Total knee arthroplasty 08/05/2011    Procedure: TOTAL KNEE ARTHROPLASTY;  Surgeon: Loanne Drilling, MD;  Location: [facility];  Service: Orthopedics;  Laterality: Right;    Family History  Problem Relation Age of Onset  . Heart disease Mother     Heart Disease before age 63  . Heart disease Father   . Hypertension Sister     History  Substance Use Topics  . Smoking status: Current Every Day Smoker --  0.2 packs/day for 30 years    Types: Cigarettes  . Smokeless tobacco: Not on file  . Alcohol Use: Yes     socially    OB History    Grav Para Term Preterm Abortions TAB SAB Ect Mult Living                  Review of Systems  Constitutional: Negative.   HENT: Negative.   Eyes: Negative.   Respiratory: Positive for cough. Negative for shortness of breath.   Cardiovascular: Positive for chest pain. Negative for palpitations and leg swelling.  Gastrointestinal: Negative.   Musculoskeletal: Negative.   Skin:  Negative.   Neurological: Positive for light-headedness. Negative for seizures, syncope, facial asymmetry, speech difficulty, weakness and headaches.  Psychiatric/Behavioral: Negative.   All other systems reviewed and are negative.    Allergies  Review of patient's allergies indicates no known allergies.  Home Medications   Current Outpatient Rx  Name Route Sig Dispense Refill  . ASPIRIN 81 MG PO CHEW Oral Chew 81 mg by mouth daily.    Marland Kitchen BIOTIN 10 MG PO CAPS Oral Take 1 tablet by mouth daily.    . SERTRALINE HCL 100 MG PO TABS Oral Take 100 mg by mouth daily.      BP 137/90  Pulse 105  Temp 97.9 F (36.6 C) (Oral)  Resp 16  SpO2 100%  Physical Exam  Nursing note and vitals reviewed. Constitutional: She is oriented to person, place, and time. She appears well-developed and well-nourished.  HENT:  Head: Normocephalic and atraumatic.  Eyes: Conjunctivae normal and EOM are normal. Pupils are equal, round, and reactive to light.  Neck: Normal range of motion. Neck supple.  Cardiovascular: Normal heart sounds and normal pulses.  Tachycardia present.   Pulmonary/Chest: Effort normal and breath sounds normal. No respiratory distress. She has no wheezes. She exhibits no tenderness.  Abdominal: Soft. Bowel sounds are normal. She exhibits no distension.  Musculoskeletal: Normal range of motion. She exhibits no edema and no tenderness.  Neurological: She is alert and oriented to person, place, and time. She has normal reflexes. No cranial nerve deficit. Coordination normal.  Skin: Skin is warm and dry.  Psychiatric: She has a normal mood and affect.       anxiety    ED Course  Procedures (including critical care time)  Labs Reviewed  CBC - Abnormal; Notable for the following:    MCH 35.1 (*)     MCHC 36.1 (*)     All other components within normal limits  BASIC METABOLIC PANEL - Abnormal; Notable for the following:    Glucose, Bld 107 (*)     All other components within  normal limits  POCT I-STAT TROPONIN I   Dg Chest Port 1 View  02/17/2012  *RADIOLOGY REPORT*  Clinical Data: Chest pain  PORTABLE CHEST - 1 VIEW  Comparison: 06/18/2011  Findings: The heart and pulmonary vascularity are within normal limits.  The lungs are clear bilaterally.  No acute bony abnormality is seen.  IMPRESSION: No acute abnormality noted.   Original Report Authenticated By: Phillips Odor, M.D.      No diagnosis found.    MDM   66 yo female with L chest pain radiating to her back x 24 hours.  Troponin - x 2.  EKG ST 104 and unremarkable.  Chest x-ray with no acute process and reviewed by myself and Dr. Denton Lank. All other labs unremarkable.  Spoke with [name] Ohm PA for [employer]  cardiology and she will have a close follow up.  Pain free at discharge.  Patient understands to return for severe pain, SOB or any other concerns.    Date: 02/18/2012  Rate: 107   Rhythm: sinus tachycardia  QRS Axis: normal  Intervals: normal  ST/T Wave abnormalities: normal  Conduction Disutrbances:none  Narrative Interpretation:   Old EKG Reviewed: unchanged Labs Reviewed  CBC - Abnormal; Notable for the following:    MCH 35.1 (*)     MCHC 36.1 (*)     All other components within normal limits  BASIC METABOLIC PANEL - Abnormal; Notable for the following:    Glucose, Bld 107 (*)     All other components within normal limits  POCT I-STAT TROPONIN I  POCT I-STAT TROPONIN I  LAB REPORT - SCANNED          Remi Haggard, NP 02/18/12 1837

## 2012-02-17 NOTE — ED Notes (Signed)
Pt reports L sided chest pain that radiates through to back since last night. Describes as tightness. C/o diaphoresis, nausea, lightheadedness. Denies vomiting, shob.

## 2012-02-22 NOTE — ED Provider Notes (Signed)
Medical screening examination/treatment/procedure(s) were conducted as a shared visit with non-physician practitioner(s) and myself.  I personally evaluated the patient during the encounter Pt c/o  Cp, constant, 24+ hrs. Trop x 2 neg. Close card f/u. Chest cta, rrr.   Suzi Roots, MD 02/22/12 [phone]

## 2012-02-28 ENCOUNTER — Encounter: Payer: BC Managed Care – PPO | Admitting: Cardiology

## 2012-03-24 ENCOUNTER — Encounter: Payer: BC Managed Care – PPO | Admitting: Cardiovascular Disease

## 2013-10-05 ENCOUNTER — Ambulatory Visit (INDEPENDENT_AMBULATORY_CARE_PROVIDER_SITE_OTHER): Payer: Medicare Other

## 2013-10-05 VITALS — BP 142/80 | HR 86 | Resp 12

## 2013-10-05 DIAGNOSIS — M722 Plantar fascial fibromatosis: Secondary | ICD-10-CM

## 2013-10-05 DIAGNOSIS — S86899A Other injury of other muscle(s) and tendon(s) at lower leg level, unspecified leg, initial encounter: Secondary | ICD-10-CM

## 2013-10-05 DIAGNOSIS — R52 Pain, unspecified: Secondary | ICD-10-CM

## 2013-10-05 DIAGNOSIS — M7670 Peroneal tendinitis, unspecified leg: Secondary | ICD-10-CM

## 2013-10-05 DIAGNOSIS — M7750 Other enthesopathy of unspecified foot: Secondary | ICD-10-CM

## 2013-10-05 MED ORDER — MELOXICAM 15 MG PO TABS: 15.0000 mg | ORAL_TABLET | Freq: Every day | ORAL | Status: DC

## 2013-10-05 MED ORDER — CYCLOBENZAPRINE HCL 10 MG PO TABS: 10.0000 mg | ORAL_TABLET | Freq: Three times a day (TID) | ORAL | Status: DC | PRN

## 2013-10-05 NOTE — Patient Instructions (Signed)
ICE INSTRUCTIONS  Apply ice or cold pack to the affected area at least 3 times a day for 10-15 minutes each time.  You should also use ice after prolonged activity or vigorous exercise.  Do not apply ice longer than 20 minutes at one time.  Always keep a cloth between your skin and the ice pack to prevent burns.  Being consistent and following these instructions will help control your symptoms.  We suggest you purchase a gel ice pack because they are reusable and do bit leak.  Some of them are designed to wrap around the area.  Use the method that works best for you.  Here are some other suggestions for icing.   Use a frozen bag of peas or corn-inexpensive and molds well to your body, usually stays frozen for 10 to 20 minutes.  Wet a towel with cold water and squeeze out the excess until it's damp.  Place in a bag in the freezer for 20 minutes. Then remove and use.  Alternating hot and cold compresses over the muscle were upper leg area 2 or 3 times daily

## 2013-10-05 NOTE — Progress Notes (Signed)
   Subjective:    Patient ID: Michelle [name], female    DOB: 1945/10/04, 68 y.o.   MRN: [id number]  HPI PT STATED HAVE FLAT FEET AND MAKING THE RT FOOT SHEEN HURTS FOR 3 YEARS. THE LEG GET AGGRAVATED BY WALKING OR STANDING AND TRIED ICE AND HEAT BUT NOTHING HELPING.   Review of Systems  Musculoskeletal: Positive for gait problem.  All other systems reviewed and are negative.      Objective:   Physical Exam Neurovascular status is intact pedal pulses are palpable epicritic and proprioceptive sensations intact and symmetric bilateral patient is a history of lumbar radiculopathy and some problems due to arthropathy in the back affecting lumbar spine also is having right hip replacement which may be causing some problems as well however at this time patient is having pain.. There is anterior shin splints or peroneal tendinitis L. anterior lateral muscle group of the right leg. The left leg is not as severe reproducible at this time. Pain pedal pulses are palpable DP and PT +2/4 capillary refill time 3 seconds all digits epicritic and proprioceptive sensations intact and symmetric bilateral. Patient is a previous MRI which did not find any osseous abnormalities no fractures or cyst or tumors. Patient does have some mild to moderate varicosities until dictation on both lower extremities right slightly more than left appears the ulcer debridement. There is tenderness over the tibialis anterior muscle belly and peroneal tendon muscle belly on palpation and on active eversion and dorsiflexion to x-rays AP lateral and oblique x-rays bilateral demonstrate some mild promontory changes subluxation Lisfranc's for fifth metatarsal base and cuboid anterior breach of cyma line consistent with promontory changes there is also pain tenderness on palpation of plantar fascial bilateral although the mid and the fascia not switched inferior calcaneal tubercle minimal calcaneal spurring is noted       Assessment &  Plan:  Assessment this time is possible shin splint versus peroneal tendinitis secondary gait abnormalities or adjustments. Patient has also on the case when she walks in the beach aggravated and I suggested any uneven surfaces especially walking inclines or declines would aggravate shin splints or. Tendinitis. There did not appear be pain on palpation of bone however the muscle belly lateral compartment leg is tender on palpation right more so than left patient may be having some muscle spasm cramps versus tibialis anterior shin splints or peroneal tendinitis. Plan at this time patient placed a regimen anti-inflammatory prescription for [facility] or meloxicam is given also prescription for Flexeril is 4 to pharmacy 10 mg 3 times a day for up to one month. Recheck in one month for reevaluation may be K. for other alternatives therapies possible physical therapy further mobilization or possibly other noninvasive studies  Harriet Masson DPM

## 2014-01-04 ENCOUNTER — Encounter: Payer: Self-pay | Admitting: Internal Medicine

## 2014-03-02 ENCOUNTER — Telehealth: Payer: Self-pay | Admitting: *Deleted

## 2014-03-02 ENCOUNTER — Ambulatory Visit (AMBULATORY_SURGERY_CENTER): Payer: BC Managed Care – PPO | Admitting: *Deleted

## 2014-03-02 VITALS — Ht 60.25 in | Wt 207.4 lb

## 2014-03-02 DIAGNOSIS — Z1211 Encounter for screening for malignant neoplasm of colon: Secondary | ICD-10-CM

## 2014-03-02 NOTE — Telephone Encounter (Signed)
Faxed ROI to [facility]. 

## 2014-03-02 NOTE — Progress Notes (Signed)
No egg or soy allergy. ewm No home 02 use. ewm No blood thinners, no diet pills. ewm No issues with past sedation. ewm

## 2014-03-02 NOTE — Telephone Encounter (Signed)
Pt scheduled for a colon 11-23 Monday with dr Carlean Purl. She states she had a colon about 10 years ago with dr Reubin Milan at [place], records release to patti Martinique CMA for [facility].   Lelan Pons PV

## 2014-03-07 NOTE — Telephone Encounter (Signed)
Spoke with Michelle [name] in medical records at [place], she is faxing over the records we requested.

## 2014-03-08 NOTE — Telephone Encounter (Signed)
Received records from [place]. Put on Dr Celesta Aver desk.

## 2014-03-14 ENCOUNTER — Encounter: Payer: Self-pay | Admitting: Internal Medicine

## 2014-03-14 ENCOUNTER — Ambulatory Visit (AMBULATORY_SURGERY_CENTER): Payer: Medicare Other | Admitting: Internal Medicine

## 2014-03-14 VITALS — BP 149/94 | HR 84 | Temp 98.3°F | Resp 18 | Ht 60.25 in | Wt 207.0 lb

## 2014-03-14 DIAGNOSIS — D125 Benign neoplasm of sigmoid colon: Secondary | ICD-10-CM

## 2014-03-14 DIAGNOSIS — Z1211 Encounter for screening for malignant neoplasm of colon: Secondary | ICD-10-CM

## 2014-03-14 DIAGNOSIS — D122 Benign neoplasm of ascending colon: Secondary | ICD-10-CM

## 2014-03-14 MED ORDER — SODIUM CHLORIDE 0.9 % IV SOLN: 500.0000 mL | INTRAVENOUS | Status: DC

## 2014-03-14 NOTE — Patient Instructions (Addendum)
I found and removed 3 small polyps that look benign. You also have a condition called diverticulosis - common and not usually a problem. Please read the handout provided.  I will let you know pathology results and when to have another routine colonoscopy by mail.  I appreciate the opportunity to care for you. Gatha Mayer, MD, FACG  YOU HAD AN ENDOSCOPIC PROCEDURE TODAY AT [facility]: Refer to the procedure report that was given to you for any specific questions about what was found during the examination.  If the procedure report does not answer your questions, please call your gastroenterologist to clarify.  If you requested that your care partner not be given the details of your procedure findings, then the procedure report has been included in a sealed envelope for you to review at your convenience later.  YOU SHOULD EXPECT: Some feelings of bloating in the abdomen. Passage of more gas than usual.  Walking can help get rid of the air that was put into your GI tract during the procedure and reduce the bloating. If you had a lower endoscopy (such as a colonoscopy or flexible sigmoidoscopy) you may notice spotting of blood in your stool or on the toilet paper. If you underwent a bowel prep for your procedure, then you may not have a normal bowel movement for a few days.  DIET: Your first meal following the procedure should be a light meal and then it is ok to progress to your normal diet.  A half-sandwich or bowl of soup is an example of a good first meal.  Heavy or fried foods are harder to digest and may make you feel nauseous or bloated.  Likewise meals heavy in dairy and vegetables can cause extra gas to form and this can also increase the bloating.  Drink plenty of fluids but you should avoid alcoholic beverages for 24 hours.  ACTIVITY: Your care partner should take you home directly after the procedure.  You should plan to take it easy, moving slowly for the rest of  the day.  You can resume normal activity the day after the procedure however you should NOT DRIVE or use heavy machinery for 24 hours (because of the sedation medicines used during the test).    SYMPTOMS TO REPORT IMMEDIATELY: A gastroenterologist can be reached at any hour.  During normal business hours, 8:30 AM to 5:00 PM Monday through Friday, call [phone].  After hours and on weekends, please call the GI answering service at [phone] who will take a message and have the physician on call contact you.   Following lower endoscopy (colonoscopy or flexible sigmoidoscopy):  Excessive amounts of blood in the stool  Significant tenderness or worsening of abdominal pains  Swelling of the abdomen that is new, acute  Fever of 100F or higher   FOLLOW UP: If any biopsies were taken you will be contacted by phone or by letter within the next 1-3 weeks.  Call your gastroenterologist if you have not heard about the biopsies in 3 weeks.  Our staff will call the home number listed on your records the next business day following your procedure to check on you and address any questions or concerns that you may have at that time regarding the information given to you following your procedure. This is a courtesy call and so if there is no answer at the home number and we have not heard from you through the emergency physician on call, we  will assume that you have returned to your regular daily activities without incident.  SIGNATURES/CONFIDENTIALITY: You and/or your care partner have signed paperwork which will be entered into your electronic medical record.  These signatures attest to the fact that that the information above on your After Visit Summary has been reviewed and is understood.  Full responsibility of the confidentiality of this discharge information lies with you and/or your care-partner.  Polyp and diverticulosis information given.

## 2014-03-14 NOTE — Progress Notes (Signed)
Report to PACU, RN, vss, BBS= Clear.  

## 2014-03-14 NOTE — Progress Notes (Signed)
Called to room to assist during endoscopic procedure.  Patient ID and intended procedure confirmed with present staff. Received instructions for my participation in the procedure from the performing physician.  

## 2014-03-14 NOTE — Op Note (Signed)
[place]  [address] [place], [id number]   COLONOSCOPY PROCEDURE REPORT  PATIENT: Michelle [name], Michelle [name]  MR#: [id number] BIRTHDATE: [birth year] , 68  yrs. old GENDER: female ENDOSCOPIST: Gatha Mayer, MD, [facility] PROCEDURE DATE:  03/14/2014 PROCEDURE:   Colonoscopy with biopsy and Colonoscopy with snare polypectomy First Screening Colonoscopy - Avg.  risk and is 50 yrs.  old or older - No.  Prior Negative Screening - Now for repeat screening. N/A  History of Adenoma - Now for follow-up colonoscopy & has been > or = to 3 yrs.  N/A  Polyps Removed Today? Yes. ASA CLASS:   Class III INDICATIONS:average risk for colorectal cancer. MEDICATIONS: Propofol 380 mg IV and Monitored anesthesia care  DESCRIPTION OF PROCEDURE:   After the risks benefits and alternatives of the procedure were thoroughly explained, informed consent was obtained.  The digital rectal exam revealed no abnormalities of the rectum.   The LB [id number]  endoscope was introduced through the anus and advanced to the cecum, which was identified by both the appendix and ileocecal valve. No adverse events experienced.   The quality of the prep was excellent, using MiraLax  The instrument was then slowly withdrawn as the colon was fully examined.   COLON FINDINGS: 1) Ascending polyp (72mm) completely removed with cold forceps and sent to pathology.  2) Sigmoid polyps (2) 3-4 mm,completely removed with cold snare and sent to pathology.  3) Sigmoid diverticulosis.  4) Otherwise normal colonoscopy, excellent prep.  Retroflexed views revealed no abnormalities. The time to cecum=3 minutes 41 seconds.  Withdrawal time=11 minutes 19 seconds. The scope was withdrawn and the procedure completed. COMPLICATIONS: There were no immediate complications.  ENDOSCOPIC IMPRESSION: 1) Ascending polyp (81mm)  removed 2) Sigmoid polyps (2) 3-4 mm, removed 3) Sigmoid diverticulosis 4) Otherwise normal colonoscopy,  excellent prep  RECOMMENDATIONS: Timing of repeat colonoscopy will be determined by pathology findings.  eSigned:  Gatha Mayer, MD, [facility] 03/14/2014 10:19 AM   cc: The Patient, Dory Horn, MD and Rachell Cipro, MD

## 2014-03-15 ENCOUNTER — Telehealth: Payer: Self-pay | Admitting: *Deleted

## 2014-03-15 NOTE — Telephone Encounter (Signed)
  Follow up Call-  Call back number 03/14/2014  Post procedure Call Back phone  # [phone] hm  Permission to leave phone message Yes     Patient questions:  Do you have a fever, pain , or abdominal swelling? No. Pain Score  0 *  Have you tolerated food without any problems? Yes.    Have you been able to return to your normal activities? Yes.    Do you have any questions about your discharge instructions: Diet   No. Medications  No. Follow up visit  No.  Do you have questions or concerns about your Care? No.  Actions: * If pain score is 4 or above: No action needed, pain <4.

## 2014-03-21 ENCOUNTER — Encounter: Payer: Self-pay | Admitting: Internal Medicine

## 2014-03-21 DIAGNOSIS — Z860101 Personal history of adenomatous and serrated colon polyps: Secondary | ICD-10-CM | POA: Insufficient documentation

## 2014-03-21 DIAGNOSIS — Z86010 Personal history of colonic polyps: Secondary | ICD-10-CM

## 2014-03-21 HISTORY — DX: Personal history of colonic polyps: Z86.010

## 2014-03-21 HISTORY — DX: Personal history of adenomatous and serrated colon polyps: Z86.0101

## 2014-03-21 NOTE — Progress Notes (Signed)
Quick Note:  Diminutive adenoma 2 other polyps not pre-cancerous Repeat colonoscopy 2020 ______

## 2014-05-02 ENCOUNTER — Other Ambulatory Visit: Payer: Self-pay | Admitting: Family Medicine

## 2014-05-02 ENCOUNTER — Ambulatory Visit
Admission: RE | Admit: 2014-05-02 | Discharge: 2014-05-02 | Disposition: A | Discharge status: Home / Self Care | Payer: Medicare Other | Source: Ambulatory Visit | Attending: Family Medicine | Admitting: Family Medicine

## 2014-05-02 DIAGNOSIS — R05 Cough: Secondary | ICD-10-CM

## 2014-05-02 DIAGNOSIS — R059 Cough, unspecified: Secondary | ICD-10-CM

## 2014-08-17 ENCOUNTER — Other Ambulatory Visit: Payer: Self-pay | Admitting: Family Medicine

## 2014-08-17 ENCOUNTER — Ambulatory Visit
Admission: RE | Admit: 2014-08-17 | Discharge: 2014-08-17 | Disposition: A | Discharge status: Home / Self Care | Payer: Medicare Other | Source: Ambulatory Visit | Attending: Family Medicine | Admitting: Family Medicine

## 2014-08-17 DIAGNOSIS — R1031 Right lower quadrant pain: Secondary | ICD-10-CM

## 2014-08-17 DIAGNOSIS — R109 Unspecified abdominal pain: Secondary | ICD-10-CM

## 2014-08-19 ENCOUNTER — Other Ambulatory Visit: Payer: Self-pay | Admitting: Family Medicine

## 2014-08-19 DIAGNOSIS — N289 Disorder of kidney and ureter, unspecified: Secondary | ICD-10-CM

## 2014-08-19 DIAGNOSIS — N200 Calculus of kidney: Secondary | ICD-10-CM

## 2014-08-24 ENCOUNTER — Other Ambulatory Visit: Payer: Medicare Other

## 2015-07-12 DIAGNOSIS — H9312 Tinnitus, left ear: Secondary | ICD-10-CM | POA: Diagnosis not present

## 2015-07-12 DIAGNOSIS — H903 Sensorineural hearing loss, bilateral: Secondary | ICD-10-CM | POA: Diagnosis not present

## 2015-07-14 ENCOUNTER — Other Ambulatory Visit: Payer: Self-pay | Admitting: Otolaryngology

## 2015-07-14 DIAGNOSIS — H903 Sensorineural hearing loss, bilateral: Secondary | ICD-10-CM

## 2015-07-24 ENCOUNTER — Ambulatory Visit
Admission: RE | Admit: 2015-07-24 | Discharge: 2015-07-24 | Disposition: A | Discharge status: Home / Self Care | Payer: Medicare Other | Source: Ambulatory Visit | Attending: Otolaryngology | Admitting: Otolaryngology

## 2015-07-24 DIAGNOSIS — H903 Sensorineural hearing loss, bilateral: Secondary | ICD-10-CM

## 2015-07-24 DIAGNOSIS — H9312 Tinnitus, left ear: Secondary | ICD-10-CM | POA: Diagnosis not present

## 2015-07-24 MED ORDER — GADOBENATE DIMEGLUMINE 529 MG/ML IV SOLN
20.0000 mL | Freq: Once | INTRAVENOUS | Status: AC | PRN
  Administered 2015-07-24: 20 mL via INTRAVENOUS

## 2015-08-30 DIAGNOSIS — F411 Generalized anxiety disorder: Secondary | ICD-10-CM | POA: Diagnosis not present

## 2015-08-30 DIAGNOSIS — E559 Vitamin D deficiency, unspecified: Secondary | ICD-10-CM | POA: Diagnosis not present

## 2015-08-30 DIAGNOSIS — H9192 Unspecified hearing loss, left ear: Secondary | ICD-10-CM | POA: Diagnosis not present

## 2015-10-06 ENCOUNTER — Emergency Department (HOSPITAL_COMMUNITY): Payer: Medicare Other

## 2015-10-06 ENCOUNTER — Emergency Department (HOSPITAL_COMMUNITY)
Admission: EM | Admit: 2015-10-06 | Discharge: 2015-10-06 | Disposition: A | Discharge status: Home / Self Care | Payer: Medicare Other | Attending: Emergency Medicine | Admitting: Emergency Medicine

## 2015-10-06 ENCOUNTER — Encounter (HOSPITAL_COMMUNITY): Payer: Self-pay | Admitting: Emergency Medicine

## 2015-10-06 DIAGNOSIS — K5790 Diverticulosis of intestine, part unspecified, without perforation or abscess without bleeding: Secondary | ICD-10-CM | POA: Diagnosis not present

## 2015-10-06 DIAGNOSIS — K5792 Diverticulitis of intestine, part unspecified, without perforation or abscess without bleeding: Secondary | ICD-10-CM | POA: Insufficient documentation

## 2015-10-06 DIAGNOSIS — F17210 Nicotine dependence, cigarettes, uncomplicated: Secondary | ICD-10-CM | POA: Insufficient documentation

## 2015-10-06 DIAGNOSIS — Z79899 Other long term (current) drug therapy: Secondary | ICD-10-CM | POA: Insufficient documentation

## 2015-10-06 DIAGNOSIS — F329 Major depressive disorder, single episode, unspecified: Secondary | ICD-10-CM | POA: Insufficient documentation

## 2015-10-06 DIAGNOSIS — E785 Hyperlipidemia, unspecified: Secondary | ICD-10-CM | POA: Diagnosis not present

## 2015-10-06 DIAGNOSIS — K5732 Diverticulitis of large intestine without perforation or abscess without bleeding: Secondary | ICD-10-CM | POA: Diagnosis not present

## 2015-10-06 DIAGNOSIS — R1032 Left lower quadrant pain: Secondary | ICD-10-CM | POA: Diagnosis not present

## 2015-10-06 LAB — COMPREHENSIVE METABOLIC PANEL
ALBUMIN: 4.3 g/dL (ref 3.5–5.0)
ALT: 47 U/L (ref 14–54)
AST: 28 U/L (ref 15–41)
Alkaline Phosphatase: 68 U/L (ref 38–126)
Anion gap: 8 (ref 5–15)
BILIRUBIN TOTAL: 0.8 mg/dL (ref 0.3–1.2)
BUN: 19 mg/dL (ref 6–20)
CHLORIDE: 104 mmol/L (ref 101–111)
CO2: 25 mmol/L (ref 22–32)
CREATININE: 0.71 mg/dL (ref 0.44–1.00)
Calcium: 9.3 mg/dL (ref 8.9–10.3)
GFR calc Af Amer: >60 mL/min (ref >60)
GFR calc non Af Amer: >60 mL/min (ref >60)
GLUCOSE: 108 mg/dL — AB (ref 65–99)
POTASSIUM: 4.1 mmol/L (ref 3.5–5.1)
Sodium: 137 mmol/L (ref 135–145)
Total Protein: 7.6 g/dL (ref 6.5–8.1)

## 2015-10-06 LAB — URINALYSIS, ROUTINE W REFLEX MICROSCOPIC
BILIRUBIN URINE: NEGATIVE
GLUCOSE, UA: NEGATIVE mg/dL
HGB URINE DIPSTICK: NEGATIVE
KETONES UR: NEGATIVE mg/dL
Leukocytes, UA: NEGATIVE
Nitrite: NEGATIVE
PH: 5.5 (ref 5.0–8.0)
Protein, ur: NEGATIVE mg/dL
Specific Gravity, Urine: 1.013 (ref 1.005–1.030)

## 2015-10-06 LAB — CBC
HEMATOCRIT: 44.1 % (ref 36.0–46.0)
Hemoglobin: 15.6 g/dL — ABNORMAL HIGH (ref 12.0–15.0)
MCH: 34.1 pg — ABNORMAL HIGH (ref 26.0–34.0)
MCHC: 35.4 g/dL (ref 30.0–36.0)
MCV: 96.3 fL (ref 78.0–100.0)
Platelets: 222 10*3/uL (ref 150–400)
RBC: 4.58 MIL/uL (ref 3.87–5.11)
RDW: 12.2 % (ref 11.5–15.5)
WBC: 6.3 10*3/uL (ref 4.0–10.5)

## 2015-10-06 LAB — LIPASE, BLOOD: Lipase: 35 U/L (ref 11–51)

## 2015-10-06 MED ORDER — ONDANSETRON HCL 4 MG/2ML IJ SOLN
4.0000 mg | Freq: Once | INTRAMUSCULAR | Status: AC
  Administered 2015-10-06: 4 mg via INTRAVENOUS
  Filled 2015-10-06: qty 2

## 2015-10-06 MED ORDER — METRONIDAZOLE 500 MG PO TABS: 500.0000 mg | ORAL_TABLET | Freq: Three times a day (TID) | ORAL | Status: DC

## 2015-10-06 MED ORDER — CIPROFLOXACIN HCL 500 MG PO TABS: 500.0000 mg | ORAL_TABLET | Freq: Two times a day (BID) | ORAL | Status: DC

## 2015-10-06 MED ORDER — FENTANYL CITRATE (PF) 100 MCG/2ML IJ SOLN
50.0000 ug | Freq: Once | INTRAMUSCULAR | Status: AC
  Administered 2015-10-06: 50 ug via INTRAVENOUS
  Filled 2015-10-06: qty 2

## 2015-10-06 MED ORDER — METRONIDAZOLE 500 MG PO TABS
500.0000 mg | ORAL_TABLET | Freq: Once | ORAL | Status: AC
  Administered 2015-10-06: 500 mg via ORAL
  Filled 2015-10-06: qty 1

## 2015-10-06 MED ORDER — IOPAMIDOL (ISOVUE-300) INJECTION 61%
100.0000 mL | Freq: Once | INTRAVENOUS | Status: AC | PRN
  Administered 2015-10-06: 100 mL via INTRAVENOUS

## 2015-10-06 MED ORDER — CIPROFLOXACIN HCL 500 MG PO TABS
500.0000 mg | ORAL_TABLET | Freq: Once | ORAL | Status: AC
  Administered 2015-10-06: 500 mg via ORAL
  Filled 2015-10-06: qty 1

## 2015-10-06 NOTE — ED Provider Notes (Signed)
Medical screening examination/treatment/procedure(s) were conducted as a shared visit with non-physician practitioner(s) and myself.  I personally evaluated the patient during the encounter.   EKG Interpretation None       Results for orders placed or performed during the hospital encounter of 10/06/15  Lipase, blood  Result Value Ref Range   Lipase 35 11 - 51 U/L  Comprehensive metabolic panel  Result Value Ref Range   Sodium 137 135 - 145 mmol/L   Potassium 4.1 3.5 - 5.1 mmol/L   Chloride 104 101 - 111 mmol/L   CO2 25 22 - 32 mmol/L   Glucose, Bld 108 (H) 65 - 99 mg/dL   BUN 19 6 - 20 mg/dL   Creatinine, Ser 0.71 0.44 - 1.00 mg/dL   Calcium 9.3 8.9 - 10.3 mg/dL   Total Protein 7.6 6.5 - 8.1 g/dL   Albumin 4.3 3.5 - 5.0 g/dL   AST 28 15 - 41 U/L   ALT 47 14 - 54 U/L   Alkaline Phosphatase 68 38 - 126 U/L   Total Bilirubin 0.8 0.3 - 1.2 mg/dL   GFR calc non Af Amer >60 >60 mL/min   GFR calc Af Amer >60 >60 mL/min   Anion gap 8 5 - 15  CBC  Result Value Ref Range   WBC 6.3 4.0 - 10.5 K/uL   RBC 4.58 3.87 - 5.11 MIL/uL   Hemoglobin 15.6 (H) 12.0 - 15.0 g/dL   HCT 44.1 36.0 - 46.0 %   MCV 96.3 78.0 - 100.0 fL   MCH 34.1 (H) 26.0 - 34.0 pg   MCHC 35.4 30.0 - 36.0 g/dL   RDW 12.2 11.5 - 15.5 %   Platelets 222 150 - 400 K/uL  Urinalysis, Routine w reflex microscopic  Result Value Ref Range   Color, Urine YELLOW YELLOW   APPearance CLOUDY (A) CLEAR   Specific Gravity, Urine 1.013 1.005 - 1.030   pH 5.5 5.0 - 8.0   Glucose, UA NEGATIVE NEGATIVE mg/dL   Hgb urine dipstick NEGATIVE NEGATIVE   Bilirubin Urine NEGATIVE NEGATIVE   Ketones, ur NEGATIVE NEGATIVE mg/dL   Protein, ur NEGATIVE NEGATIVE mg/dL   Nitrite NEGATIVE NEGATIVE   Leukocytes, UA NEGATIVE NEGATIVE   Ct Abdomen Pelvis W Contrast  10/06/2015  CLINICAL DATA:  Left lower quadrant pain for 4 days. EXAM: CT ABDOMEN AND PELVIS WITH CONTRAST TECHNIQUE: Multidetector CT imaging of the abdomen and pelvis was  performed using the standard protocol following bolus administration of intravenous contrast. CONTRAST:  172mL ISOVUE-300 IOPAMIDOL (ISOVUE-300) INJECTION 61% COMPARISON:  None. FINDINGS: Lower chest: Peripheral reticular densities at lung bases may represent atelectasis or mild scarring. No pleural effusions. Hepatobiliary: 2.1 cm low-density structure in left hepatic lobe probably represents a cyst. There was a cyst in this area on prior ultrasound from 02/12/2007. Low-attenuation of the liver compatible with underlying steatosis. The portal venous system is patent. There is probably a focal fat sparing along the medial aspect of the liver on sequence 2, image 24. Pancreas: Normal appearance of the pancreas without inflammation or duct dilatation. Spleen: Normal appearance of spleen without enlargement. Adrenals/Urinary Tract: Normal adrenal glands. Normal appearance of both kidneys and the urinary bladder. Negative for hydronephrosis. Stomach/Bowel: Normal appearance of stomach and small bowel. Diverticula involving the descending colon and sigmoid colon. Mild stranding around a diverticulum in the proximal sigmoid colon on sequence 2, image 65. No evidence for free fluid or abscess collection. Normal appearance of the terminal ileum. Vascular/Lymphatic: Few atherosclerotic  calcifications in the iliac arteries. No aortic aneurysm. No suspicious lymphadenopathy. Reproductive: Normal appearance of the uterus. There is a small calcification in the left ovary. Right ovary is not confidently identified. Other: No free fluid. Negative for free air. Umbilical hernia containing fat. Musculoskeletal: No acute abnormality. IMPRESSION: Small focus of inflammation involving a colonic diverticulum in the left lower quadrant. Findings are compatible with acute diverticulitis. No evidence for an abscess. Hepatic steatosis. Suspect focal fat sparing along the medial aspect of the liver as described. Hepatic cyst. Electronically  Signed   By: Markus Daft M.D.   On: 10/06/2015 18:23    CT scan consistent with uncomplicated diverticulitis. Patient's had left lower quadrant abdominal pain for 4 days. Seen in urgent care today sent here appropriately for CT scan. Patient will receive Cipro and Flagyl here. Patient is appropriate for outpatient treatment. She'll be continued on antibiotics. She'll return if no improvement in a few days or for any new or worse symptoms. Patient's abdomen slight tenderness left lower quadrant no significant guarding patient nontoxic no acute distress.  Fredia Sorrow, MD 10/06/15 [phone]

## 2015-10-06 NOTE — Discharge Instructions (Signed)
Diverticulitis °Diverticulitis is inflammation or infection of small pouches in your colon that form when you have a condition called diverticulosis. The pouches in your colon are called diverticula. Your colon, or large intestine, is where water is absorbed and stool is formed. °Complications of diverticulitis can include: °· Bleeding. °· Severe infection. °· Severe pain. °· Perforation of your colon. °· Obstruction of your colon. °CAUSES  °Diverticulitis is caused by bacteria. °Diverticulitis happens when stool becomes trapped in diverticula. This allows bacteria to grow in the diverticula, which can lead to inflammation and infection. °RISK FACTORS °People with diverticulosis are at risk for diverticulitis. Eating a diet that does not include enough fiber from fruits and vegetables may make diverticulitis more likely to develop. °SYMPTOMS  °Symptoms of diverticulitis may include: °· Abdominal pain and tenderness. The pain is normally located on the left side of the abdomen, but may occur in other areas. °· Fever and chills. °· Bloating. °· Cramping. °· Nausea. °· Vomiting. °· Constipation. °· Diarrhea. °· Blood in your stool. °DIAGNOSIS  °Your health care provider will ask you about your medical history and do a physical exam. You may need to have tests done because many medical conditions can cause the same symptoms as diverticulitis. Tests may include: °· Blood tests. °· Urine tests. °· Imaging tests of the abdomen, including X-rays and CT scans. °When your condition is under control, your health care provider may recommend that you have a colonoscopy. A colonoscopy can show how severe your diverticula are and whether something else is causing your symptoms. °TREATMENT  °Most cases of diverticulitis are mild and can be treated at home. Treatment may include: °· Taking over-the-counter pain medicines. °· Following a clear liquid diet. °· Taking antibiotic medicines by mouth for 7-10 days. °More severe cases may  be treated at a hospital. Treatment may include: °· Not eating or drinking. °· Taking prescription pain medicine. °· Receiving antibiotic medicines through an IV tube. °· Receiving fluids and nutrition through an IV tube. °· Surgery. °HOME CARE INSTRUCTIONS  °· Follow your health care provider's instructions carefully. °· Follow a full liquid diet or other diet as directed by your health care provider. After your symptoms improve, your health care provider may tell you to change your diet. He or she may recommend you eat a high-fiber diet. Fruits and vegetables are good sources of fiber. Fiber makes it easier to pass stool. °· Take fiber supplements or probiotics as directed by your health care provider. °· Only take medicines as directed by your health care provider. °· Keep all your follow-up appointments. °SEEK MEDICAL CARE IF:  °· Your pain does not improve. °· You have a hard time eating food. °· Your bowel movements do not return to normal. °SEEK IMMEDIATE MEDICAL CARE IF:  °· Your pain becomes worse. °· Your symptoms do not get better. °· Your symptoms suddenly get worse. °· You have a fever. °· You have repeated vomiting. °· You have bloody or black, tarry stools. °MAKE SURE YOU:  °· Understand these instructions. °· Will watch your condition. °· Will get help right away if you are not doing well or get worse. °  °This information is not intended to replace advice given to you by your health care provider. Make sure you discuss any questions you have with your health care provider. °  °Document Released: 01/16/2005 Document Revised: 04/13/2013 Document Reviewed: 03/03/2013 °Elsevier Interactive Patient Education ©2016 [employer] ° °

## 2015-10-06 NOTE — ED Provider Notes (Signed)
CSN: [id number]     Arrival date & time 10/06/15  1358 History   First MD Initiated Contact with Patient 10/06/15 1555     Chief Complaint  Patient presents with  . Abdominal Pain    HPI  Michelle [name] is an 70 y.o. female with history of IBS, HLD who presents to the ED for evaluation of LLQ pain. She states her pain started four days ago. She states the pain is constant.  The pain is not worse with movement or coughing. She denies associated N/V/D. Denies radiation of the pain. She took some OTC pain medicine with minimal relief. Denies fever or chills. Denies urinary or vaginal symptoms. She states she was seen at [facility] today and was sent to the ED for further evaluation and imaging.   Past Medical History  Diagnosis Date  . Chondromalacia of knee     right knee  . IBS (irritable bowel syndrome)   . Hyperlipidemia   . DDD (degenerative disc disease) 07-24-11    Osteoarthritis-s/p LTKA 2'11, now right planned  . Depression 07-24-11    tx. depression only  . Cataract   . Hx of adenomatous polyp of colon 03/21/2014   Past Surgical History  Procedure Laterality Date  . Joint replacement  2011    Left total knee replacement  . Appendectomy    . Cesarean section      X 2  . Tubal ligation    . Cataract extraction  07-24-11    bilateral with lens implant  . Oophorectomy  07-24-11    '94-one ovary removed  . Total knee arthroplasty  08/05/2011    Procedure: TOTAL KNEE ARTHROPLASTY;  Surgeon: Gearlean Alf, MD;  Location: [facility];  Service: Orthopedics;  Laterality: Right;  . Colonoscopy     Family History  Problem Relation Age of Onset  . Heart disease Mother     Heart Disease before age 43  . Heart disease Father   . Hypertension Sister   . Colon cancer Neg Hx   . Esophageal cancer Neg Hx   . Rectal cancer Neg Hx   . Stomach cancer Neg Hx   . Pancreatic cancer Neg Hx    Social History  Substance Use Topics  . Smoking status: Current Every Day Smoker -- 0.25 packs/day for 30  years    Types: Cigarettes  . Smokeless tobacco: Never Used     Comment: less than 1/2 pack  . Alcohol Use: 0.0 oz/week    0 Standard drinks or equivalent per week     Comment: socially   OB History    No data available     Review of Systems  All other systems reviewed and are negative.     Allergies  Review of patient's allergies indicates no known allergies.  Home Medications   Prior to Admission medications   Medication Sig Start Date End Date Taking? Authorizing Provider  sertraline (ZOLOFT) 100 MG tablet Take 100 mg by mouth daily.    Historical Provider, MD   BP 148/93 mmHg  Pulse 71  Temp(Src) 97.5 F (36.4 C) (Oral)  Resp 16  SpO2 95% Physical Exam  Constitutional: She is oriented to person, place, and time.  HENT:  Right Ear: External ear normal.  Left Ear: External ear normal.  Nose: Nose normal.  Mouth/Throat: Oropharynx is clear and moist. No oropharyngeal exudate.  Eyes: Conjunctivae and EOM are normal. Pupils are equal, round, and reactive to light.  Neck: Normal range  of motion. Neck supple.  Cardiovascular: Normal rate, regular rhythm, normal heart sounds and intact distal pulses.   Pulmonary/Chest: Effort normal and breath sounds normal. No respiratory distress. She has no wheezes. She exhibits no tenderness.  Abdominal: Soft. Bowel sounds are normal. She exhibits no distension. There is tenderness in the left lower quadrant. There is no rebound, no guarding and no CVA tenderness.  Musculoskeletal: She exhibits no edema.  Neurological: She is alert and oriented to person, place, and time. No cranial nerve deficit.  Skin: Skin is warm and dry.  Psychiatric: She has a normal mood and affect.  Nursing note and vitals reviewed.   ED Course  Procedures (including critical care time) Labs Review Labs Reviewed  COMPREHENSIVE METABOLIC PANEL - Abnormal; Notable for the following:    Glucose, Bld 108 (*)    All other components within normal limits   CBC - Abnormal; Notable for the following:    Hemoglobin 15.6 (*)    MCH 34.1 (*)    All other components within normal limits  LIPASE, BLOOD  URINALYSIS, ROUTINE W REFLEX MICROSCOPIC (NOT AT [facility])    Imaging Review Ct Abdomen Pelvis W Contrast  10/06/2015  CLINICAL DATA:  Left lower quadrant pain for 4 days. EXAM: CT ABDOMEN AND PELVIS WITH CONTRAST TECHNIQUE: Multidetector CT imaging of the abdomen and pelvis was performed using the standard protocol following bolus administration of intravenous contrast. CONTRAST:  157mL ISOVUE-300 IOPAMIDOL (ISOVUE-300) INJECTION 61% COMPARISON:  None. FINDINGS: Lower chest: Peripheral reticular densities at lung bases may represent atelectasis or mild scarring. No pleural effusions. Hepatobiliary: 2.1 cm low-density structure in left hepatic lobe probably represents a cyst. There was a cyst in this area on prior ultrasound from 02/12/2007. Low-attenuation of the liver compatible with underlying steatosis. The portal venous system is patent. There is probably a focal fat sparing along the medial aspect of the liver on sequence 2, image 24. Pancreas: Normal appearance of the pancreas without inflammation or duct dilatation. Spleen: Normal appearance of spleen without enlargement. Adrenals/Urinary Tract: Normal adrenal glands. Normal appearance of both kidneys and the urinary bladder. Negative for hydronephrosis. Stomach/Bowel: Normal appearance of stomach and small bowel. Diverticula involving the descending colon and sigmoid colon. Mild stranding around a diverticulum in the proximal sigmoid colon on sequence 2, image 65. No evidence for free fluid or abscess collection. Normal appearance of the terminal ileum. Vascular/Lymphatic: Few atherosclerotic calcifications in the iliac arteries. No aortic aneurysm. No suspicious lymphadenopathy. Reproductive: Normal appearance of the uterus. There is a small calcification in the left ovary. Right ovary is not confidently  identified. Other: No free fluid. Negative for free air. Umbilical hernia containing fat. Musculoskeletal: No acute abnormality. IMPRESSION: Small focus of inflammation involving a colonic diverticulum in the left lower quadrant. Findings are compatible with acute diverticulitis. No evidence for an abscess. Hepatic steatosis. Suspect focal fat sparing along the medial aspect of the liver as described. Hepatic cyst. Electronically Signed   By: Markus Daft M.D.   On: 10/06/2015 18:23   I have personally reviewed and evaluated these images and lab results as part of my medical decision-making.   EKG Interpretation None      MDM   Final diagnoses:  Acute diverticulitis    Abdomen with some left sided tenderness without guarding or rebound. Abdomen is soft. No CVA tenderness. No fever or lab abnormalities. However given age, persistent abdominal pain, and pt concern we will obtain CT abdomen/pelvis.Ddx includes diverticulitis. Will give pain meds and  anti-emetics.  CT reveals acute uncomplicated colonic diverticulitis without perforation or abscess. Also hepatic steatosis is noted. Results discussed with pt. She is nontoxic appearing with unremarkable labs and she is hemodynamically stable. Will treat diverticulitis as an outpatient. First dose cipro/flagyl given here. Instructed close f/u with PCP. ER return precautions given.   Anne Ng, PA-C 10/06/15 [phone]

## 2015-10-06 NOTE — ED Notes (Signed)
Pt reports LLQ pain x 4 days, took OC meds with no relief, was seen at [facility] today and sent to ED for imaging per pt. denies NVD, nor urinary symptoms. Last BM last night. Alert and oriented x 4, no obvious distress.

## 2015-10-18 DIAGNOSIS — C44329 Squamous cell carcinoma of skin of other parts of face: Secondary | ICD-10-CM | POA: Diagnosis not present

## 2015-10-19 DIAGNOSIS — K5792 Diverticulitis of intestine, part unspecified, without perforation or abscess without bleeding: Secondary | ICD-10-CM | POA: Diagnosis not present

## 2015-11-07 DIAGNOSIS — C44329 Squamous cell carcinoma of skin of other parts of face: Secondary | ICD-10-CM | POA: Diagnosis not present

## 2015-12-26 DIAGNOSIS — L905 Scar conditions and fibrosis of skin: Secondary | ICD-10-CM | POA: Diagnosis not present

## 2016-02-07 DIAGNOSIS — Z6841 Body Mass Index (BMI) 40.0 and over, adult: Secondary | ICD-10-CM | POA: Diagnosis not present

## 2016-02-07 DIAGNOSIS — Z01419 Encounter for gynecological examination (general) (routine) without abnormal findings: Secondary | ICD-10-CM | POA: Diagnosis not present

## 2016-02-07 DIAGNOSIS — Z1231 Encounter for screening mammogram for malignant neoplasm of breast: Secondary | ICD-10-CM | POA: Diagnosis not present

## 2016-02-29 DIAGNOSIS — R109 Unspecified abdominal pain: Secondary | ICD-10-CM | POA: Diagnosis not present

## 2016-02-29 DIAGNOSIS — N390 Urinary tract infection, site not specified: Secondary | ICD-10-CM | POA: Diagnosis not present

## 2016-03-04 ENCOUNTER — Other Ambulatory Visit: Payer: Self-pay | Admitting: Family Medicine

## 2016-03-04 DIAGNOSIS — R109 Unspecified abdominal pain: Secondary | ICD-10-CM | POA: Diagnosis not present

## 2016-03-04 DIAGNOSIS — K5792 Diverticulitis of intestine, part unspecified, without perforation or abscess without bleeding: Secondary | ICD-10-CM

## 2016-03-04 DIAGNOSIS — N390 Urinary tract infection, site not specified: Secondary | ICD-10-CM | POA: Diagnosis not present

## 2016-03-05 ENCOUNTER — Ambulatory Visit
Admission: RE | Admit: 2016-03-05 | Discharge: 2016-03-05 | Disposition: A | Discharge status: Home / Self Care | Payer: Medicare Other | Source: Ambulatory Visit | Attending: Family Medicine | Admitting: Family Medicine

## 2016-03-05 DIAGNOSIS — K5732 Diverticulitis of large intestine without perforation or abscess without bleeding: Secondary | ICD-10-CM | POA: Diagnosis not present

## 2016-03-05 DIAGNOSIS — R109 Unspecified abdominal pain: Secondary | ICD-10-CM

## 2016-03-05 DIAGNOSIS — K5792 Diverticulitis of intestine, part unspecified, without perforation or abscess without bleeding: Secondary | ICD-10-CM

## 2016-03-06 DIAGNOSIS — K5792 Diverticulitis of intestine, part unspecified, without perforation or abscess without bleeding: Secondary | ICD-10-CM | POA: Diagnosis not present

## 2016-03-06 DIAGNOSIS — Z6841 Body Mass Index (BMI) 40.0 and over, adult: Secondary | ICD-10-CM | POA: Diagnosis not present

## 2016-04-29 DIAGNOSIS — F411 Generalized anxiety disorder: Secondary | ICD-10-CM | POA: Diagnosis not present

## 2016-04-29 DIAGNOSIS — I10 Essential (primary) hypertension: Secondary | ICD-10-CM | POA: Diagnosis not present

## 2016-04-29 DIAGNOSIS — Z6841 Body Mass Index (BMI) 40.0 and over, adult: Secondary | ICD-10-CM | POA: Diagnosis not present

## 2016-05-20 DIAGNOSIS — R739 Hyperglycemia, unspecified: Secondary | ICD-10-CM | POA: Diagnosis not present

## 2016-05-20 DIAGNOSIS — E782 Mixed hyperlipidemia: Secondary | ICD-10-CM | POA: Diagnosis not present

## 2016-05-20 DIAGNOSIS — Z0000 Encounter for general adult medical examination without abnormal findings: Secondary | ICD-10-CM | POA: Diagnosis not present

## 2016-05-24 DIAGNOSIS — Z0000 Encounter for general adult medical examination without abnormal findings: Secondary | ICD-10-CM | POA: Diagnosis not present

## 2016-05-24 DIAGNOSIS — Z136 Encounter for screening for cardiovascular disorders: Secondary | ICD-10-CM | POA: Diagnosis not present

## 2016-05-24 DIAGNOSIS — Z23 Encounter for immunization: Secondary | ICD-10-CM | POA: Diagnosis not present

## 2016-06-05 DIAGNOSIS — F411 Generalized anxiety disorder: Secondary | ICD-10-CM | POA: Diagnosis not present

## 2016-06-05 DIAGNOSIS — K219 Gastro-esophageal reflux disease without esophagitis: Secondary | ICD-10-CM | POA: Diagnosis not present

## 2016-06-05 DIAGNOSIS — I10 Essential (primary) hypertension: Secondary | ICD-10-CM | POA: Diagnosis not present

## 2016-07-17 DIAGNOSIS — K219 Gastro-esophageal reflux disease without esophagitis: Secondary | ICD-10-CM | POA: Diagnosis not present

## 2016-07-17 DIAGNOSIS — Z6841 Body Mass Index (BMI) 40.0 and over, adult: Secondary | ICD-10-CM | POA: Diagnosis not present

## 2016-07-17 DIAGNOSIS — R0609 Other forms of dyspnea: Secondary | ICD-10-CM | POA: Diagnosis not present

## 2016-07-17 DIAGNOSIS — F411 Generalized anxiety disorder: Secondary | ICD-10-CM | POA: Diagnosis not present

## 2016-07-18 ENCOUNTER — Institutional Professional Consult (permissible substitution): Payer: Medicare Other | Admitting: Emergency Medicine

## 2016-07-30 ENCOUNTER — Encounter: Payer: Self-pay | Admitting: Physician Assistant

## 2016-07-31 ENCOUNTER — Ambulatory Visit (INDEPENDENT_AMBULATORY_CARE_PROVIDER_SITE_OTHER): Payer: Medicare Other | Admitting: Physician Assistant

## 2016-07-31 ENCOUNTER — Encounter (INDEPENDENT_AMBULATORY_CARE_PROVIDER_SITE_OTHER): Payer: Self-pay

## 2016-07-31 ENCOUNTER — Encounter: Payer: Self-pay | Admitting: Physician Assistant

## 2016-07-31 VITALS — BP 122/84 | HR 71 | Resp 16 | Ht 60.0 in | Wt 218.0 lb

## 2016-07-31 DIAGNOSIS — R002 Palpitations: Secondary | ICD-10-CM

## 2016-07-31 DIAGNOSIS — E7849 Other hyperlipidemia: Secondary | ICD-10-CM

## 2016-07-31 DIAGNOSIS — E784 Other hyperlipidemia: Secondary | ICD-10-CM | POA: Diagnosis not present

## 2016-07-31 DIAGNOSIS — R0602 Shortness of breath: Secondary | ICD-10-CM

## 2016-07-31 DIAGNOSIS — R600 Localized edema: Secondary | ICD-10-CM

## 2016-07-31 DIAGNOSIS — I10 Essential (primary) hypertension: Secondary | ICD-10-CM

## 2016-07-31 MED ORDER — METOPROLOL TARTRATE 25 MG PO TABS: 12.5000 mg | ORAL_TABLET | ORAL | 20 refills | Status: DC | PRN

## 2016-07-31 NOTE — Patient Instructions (Signed)
Medication Instructions:  Your physician has recommended you make the following change in your medication: start metoprolol 12.5 mg as needed for palpitations.   Labwork: Your physician recommends that you return for lab work today for TSH  Testing/Procedures: Your physician has requested that you have an echocardiogram. Echocardiography is a painless test that uses sound waves to create images of your heart. It provides your doctor with information about the size and shape of your heart and how well your heart's chambers and valves are working. This procedure takes approximately one hour. There are no restrictions for this procedure.  Your physician has requested that you have a lexiscan myoview. For further information please visit [email or web address]. Please follow instruction sheet, as given.  Your physician has recommended that you wear an event monitor. Event monitors are medical devices that record the heart's electrical activity. Doctors most often [place] these monitors to diagnose arrhythmias. Arrhythmias are problems with the speed or rhythm of the heartbeat. The monitor is a small, portable device. You can wear one while you do your normal daily activities. This is usually used to diagnose what is causing palpitations/syncope (passing out).      Follow-Up: Your physician recommends that you schedule a follow-up appointment in: 4-6 weeks with Dr. Saunders Revel  Any Other Special Instructions Will Be Listed Below (If Applicable).     If you need a refill on your cardiac medications before your next appointment, please call your pharmacy.

## 2016-07-31 NOTE — Progress Notes (Signed)
Cardiology Office Note    Date:  07/31/2016   ID:  Michelle [name], DOB 1945-07-27, MRN [id number]  PCP:  Rachell Cipro, MD  Cardiologist:  New to [facility]  Chief Complaint: Shortness of breath with activity   History of Present Illness:   Michelle [name] is a 71 y.o. female with hx of  HLD, HTN, DDD, IBS, GERD and depression referred by PCP for evaluation of dyspnea on exertion x 2 months.   First episode occurred about 2 months after dancing lessons. Her shortness of breath lasted for about 20-30 mins Initially it was intermittent and activity related. Now her Dyspnea presence at rest as well which exacerbated with activity. She complaints of intermittent chest tightness. No diaphoresis, nausea or radiation. He also has "fluttering sensation in her epigastric area". Noted palpitations mostly at night lasting for 5 minutes with self resolution.--> Might have  exacerbation of shortness of breath. She can only walks about 50 feet before meed to rest. He drinks one cup of coffee every day. She has intermittent orthopnea but no PND or lower extremity edema.  Prior tobacco smoker (she was smoking 2 packs per week). Quit in 2016. Mother had a history of rheumatic fever. She died during surgery in her 20s. Father has a history of mitral valve repair/replacement in his 56s and stroke after wards  Past Medical History:  Diagnosis Date  . Cataract   . Chondromalacia of knee    right knee  . DDD (degenerative disc disease) 07-24-11   Osteoarthritis-s/p LTKA 2'11, now right planned  . Depression 07-24-11   tx. depression only  . Hx of adenomatous polyp of colon 03/21/2014  . Hyperlipidemia   . Hypertension   . IBS (irritable bowel syndrome)     Past Surgical History:  Procedure Laterality Date  . APPENDECTOMY    . CATARACT EXTRACTION  07-24-11   bilateral with lens implant  . CESAREAN SECTION     X 2  . COLONOSCOPY    . JOINT REPLACEMENT  2011   Left total knee replacement  .  OOPHORECTOMY  07-24-11   '94-one ovary removed  . TOTAL KNEE ARTHROPLASTY  08/05/2011   Procedure: TOTAL KNEE ARTHROPLASTY;  Surgeon: Gearlean Alf, MD;  Location: [facility];  Service: Orthopedics;  Laterality: Right;  . TUBAL LIGATION      Current Medications: Prior to Admission medications   Medication Sig Start Date End Date Taking? Authorizing Provider  amoxicillin (AMOXIL) 500 MG capsule Take 2,000 mg by mouth once.    Historical Provider, MD  Cholecalciferol (VITAMIN D PO) Take 1 tablet by mouth daily.    Historical Provider, MD  ciprofloxacin (CIPRO) 500 MG tablet Take 1 tablet (500 mg total) by mouth every 12 (twelve) hours. 10/06/15   Olivia Canter Sam, PA-C  metroNIDAZOLE (FLAGYL) 500 MG tablet Take 1 tablet (500 mg total) by mouth 3 (three) times daily. 10/06/15   Olivia Canter Sam, PA-C  sertraline (ZOLOFT) 100 MG tablet Take 100 mg by mouth 2 (two) times daily.     Historical Provider, MD    Allergies:   Patient has no known allergies.   Social History   Social History  . Marital status: Divorced    Spouse name: N/A  . Number of children: N/A  . Years of education: N/A   Social History Main Topics  . Smoking status: Current Every Day Smoker    Packs/day: 0.25    Years: 30.00    Types: Cigarettes  .  Smokeless tobacco: Never Used     Comment: less than 1/2 pack  . Alcohol use 0.0 oz/week     Comment: socially  . Drug use: No  . Sexual activity: No   Other Topics Concern  . None   Social History Narrative  . None     Family History:  The patient's family history includes Heart disease in her father and mother; Hypertension in her sister.   ROS:   Please see the history of present illness.    ROS All other systems reviewed and are negative.   PHYSICAL EXAM:   VS:  BP 122/84   Pulse 71   Resp 16   Ht 5' (1.524 m)   Wt 218 lb (98.9 kg)   BMI 42.58 kg/m    GEN: Well nourished, well developed, in no acute distress  HEENT: normal  Neck: no JVD, carotid bruits, or  mass Cardiac: RRR; no murmurs, rubs, or gallops, Trace edema  Respiratory:  clear to auscultation bilaterally, normal work of breathing GI: soft, nontender, nondistended, + BS MS: no deformity or atrophy  Skin: warm and dry, no rash Neuro:  Alert and Oriented x 3, Strength and sensation are intact Psych: euthymic mood, full affect  Wt Readings from Last 3 Encounters:  07/31/16 218 lb (98.9 kg)  03/14/14 207 lb (93.9 kg)  03/02/14 207 lb 6.4 oz (94.1 kg)      Studies/Labs Reviewed:   EKG:  EKG is ordered today.  The ekg ordered today demonstrates Sinus rhythm at rate of 71 bpm. Nonspecific T-wave abnormality.  Recent Labs: 10/06/2015: ALT 47; BUN 19; Creatinine, Ser 0.71; Hemoglobin 15.6; Platelets 222; Potassium 4.1; Sodium 137   Lipid Panel No results found for: CHOL, TRIG, HDL, CHOLHDL, VLDL, LDLCALC, LDLDIRECT  Additional studies/ records that were reviewed today include:   AS above    ASSESSMENT & PLAN:    1. Dyspnea on exertion and rest - Initially dyspnea was related to exertion now occurs with rest as well. Normal pulmonary functions test @ PCP office. Her symptoms could be anginal equivalent or structural/valvular abnormalities related. She does not interested in PRN sublingual nitroglycerin. We will get echocardiogram and Lexiscan.  - Trace edema on exam. Cut back on salt. Elevated legs and try compression stocking.   2. HLD - Followed by PCP. Not on any medication. Diet controlled.  3. HTN - Controlled on Losartan.  4. Palpitations - When necessary beta blocker. Cut back on her caffeine intake. Check TSH. Get 30 days monitor.    Plan discussed with DOD Dr. Rayann Heman. Patient will establish care with Dr. Saunders Revel in 4-6 weeks.   Medication Adjustments/Labs and Tests Ordered: Current medicines are reviewed at length with the patient today.  Concerns regarding medicines are outlined above.  Medication changes, Labs and Tests ordered today are listed in the Patient  Instructions below. Patient Instructions  Medication Instructions:  Your physician has recommended you make the following change in your medication: start metoprolol 12.5 mg as needed for palpitations.   Labwork: Your physician recommends that you return for lab work today for TSH  Testing/Procedures: Your physician has requested that you have an echocardiogram. Echocardiography is a painless test that uses sound waves to create images of your heart. It provides your doctor with information about the size and shape of your heart and how well your heart's chambers and valves are working. This procedure takes approximately one hour. There are no restrictions for this procedure.  Your physician has  requested that you have a lexiscan myoview. For further information please visit [email or web address]. Please follow instruction sheet, as given.  Your physician has recommended that you wear an event monitor. Event monitors are medical devices that record the heart's electrical activity. Doctors most often [place] these monitors to diagnose arrhythmias. Arrhythmias are problems with the speed or rhythm of the heartbeat. The monitor is a small, portable device. You can wear one while you do your normal daily activities. This is usually used to diagnose what is causing palpitations/syncope (passing out).      Follow-Up: Your physician recommends that you schedule a follow-up appointment in: 4-6 weeks with Dr. Saunders Revel  Any Other Special Instructions Will Be Listed Below (If Applicable).     If you need a refill on your cardiac medications before your next appointment, please call your pharmacy.      Jarrett Soho, [place]  07/31/2016 12:08 PM    [facility] [address], [place], [place]  [address] Phone: [phone]; Fax: [phone]

## 2016-08-01 ENCOUNTER — Telehealth (HOSPITAL_COMMUNITY): Payer: Self-pay | Admitting: *Deleted

## 2016-08-01 LAB — TSH: TSH: 1.21 u[IU]/mL (ref 0.450–4.500)

## 2016-08-01 NOTE — Telephone Encounter (Signed)
Patient given detailed instructions per Myocardial Perfusion Study Information Sheet for the test on 08/05/16 Patient notified to arrive 15 minutes early and that it is imperative to arrive on time for appointment to keep from having the test rescheduled.  If you need to cancel or reschedule your appointment, please call the office within 24 hours of your appointment. Failure to do so may result in a cancellation of your appointment, and a $50 no show fee. Patient verbalized understanding. Arn Mcomber Jacqueline    

## 2016-08-05 ENCOUNTER — Ambulatory Visit (HOSPITAL_COMMUNITY): Payer: Medicare Other | Attending: Cardiology

## 2016-08-05 DIAGNOSIS — I2510 Atherosclerotic heart disease of native coronary artery without angina pectoris: Secondary | ICD-10-CM | POA: Diagnosis present

## 2016-08-05 DIAGNOSIS — R0789 Other chest pain: Secondary | ICD-10-CM | POA: Insufficient documentation

## 2016-08-05 DIAGNOSIS — Z87891 Personal history of nicotine dependence: Secondary | ICD-10-CM | POA: Insufficient documentation

## 2016-08-05 DIAGNOSIS — I10 Essential (primary) hypertension: Secondary | ICD-10-CM | POA: Diagnosis not present

## 2016-08-05 DIAGNOSIS — R9439 Abnormal result of other cardiovascular function study: Secondary | ICD-10-CM | POA: Insufficient documentation

## 2016-08-05 DIAGNOSIS — R002 Palpitations: Secondary | ICD-10-CM | POA: Diagnosis not present

## 2016-08-05 DIAGNOSIS — R0609 Other forms of dyspnea: Secondary | ICD-10-CM | POA: Diagnosis not present

## 2016-08-05 DIAGNOSIS — R0602 Shortness of breath: Secondary | ICD-10-CM | POA: Insufficient documentation

## 2016-08-05 MED ORDER — REGADENOSON 0.4 MG/5ML IV SOLN
0.4000 mg | Freq: Once | INTRAVENOUS | Status: AC
  Administered 2016-08-05: 0.4 mg via INTRAVENOUS

## 2016-08-05 MED ORDER — TECHNETIUM TC 99M TETROFOSMIN IV KIT
33.0000 | PACK | Freq: Once | INTRAVENOUS | Status: AC | PRN
  Administered 2016-08-05: 33 via INTRAVENOUS
  Filled 2016-08-05: qty 33

## 2016-08-08 ENCOUNTER — Ambulatory Visit (HOSPITAL_COMMUNITY): Payer: Medicare Other | Attending: Interventional Cardiology

## 2016-08-08 MED ORDER — TECHNETIUM TC 99M TETROFOSMIN IV KIT
31.3000 | PACK | Freq: Once | INTRAVENOUS | Status: AC | PRN
  Administered 2016-08-08: 31.3 via INTRAVENOUS
  Filled 2016-08-08: qty 32

## 2016-08-09 LAB — MYOCARDIAL PERFUSION IMAGING
CHL CUP NUCLEAR SRS: 2
CHL CUP NUCLEAR SSS: 11
LV dias vol: 82 mL (ref 46–106)
LV sys vol: 33 mL
NUC STRESS TID: 0.96
Peak HR: 89 {beats}/min
RATE: 0.42
Rest HR: 71 {beats}/min
SDS: 10

## 2016-08-13 ENCOUNTER — Telehealth: Payer: Self-pay | Admitting: Physician Assistant

## 2016-08-13 NOTE — Telephone Encounter (Signed)
Returned pts call and discussed her stress test results.

## 2016-08-13 NOTE — Telephone Encounter (Signed)
Follow Up: ° ° ° °Returning your call, concerning her lab results. °

## 2016-08-13 NOTE — Telephone Encounter (Signed)
-----   Message from [place], [place] sent at 08/13/2016  9:53 AM EDT ----- Low risk study. Many artifact due to breast attenuation. Reviewed with Dr. Rayann Heman. Follow up with Dr. Saunders Revel as schedule.

## 2016-08-14 DIAGNOSIS — F329 Major depressive disorder, single episode, unspecified: Secondary | ICD-10-CM | POA: Diagnosis not present

## 2016-08-14 DIAGNOSIS — R0609 Other forms of dyspnea: Secondary | ICD-10-CM | POA: Diagnosis not present

## 2016-08-14 DIAGNOSIS — F411 Generalized anxiety disorder: Secondary | ICD-10-CM | POA: Diagnosis not present

## 2016-08-14 DIAGNOSIS — I10 Essential (primary) hypertension: Secondary | ICD-10-CM | POA: Diagnosis not present

## 2016-08-15 ENCOUNTER — Other Ambulatory Visit: Payer: Self-pay

## 2016-08-15 ENCOUNTER — Ambulatory Visit (HOSPITAL_COMMUNITY): Payer: Medicare Other | Attending: Cardiovascular Disease

## 2016-08-15 DIAGNOSIS — I10 Essential (primary) hypertension: Secondary | ICD-10-CM | POA: Insufficient documentation

## 2016-08-15 DIAGNOSIS — E784 Other hyperlipidemia: Secondary | ICD-10-CM | POA: Insufficient documentation

## 2016-08-15 DIAGNOSIS — R002 Palpitations: Secondary | ICD-10-CM | POA: Insufficient documentation

## 2016-08-15 DIAGNOSIS — Z6841 Body Mass Index (BMI) 40.0 and over, adult: Secondary | ICD-10-CM | POA: Diagnosis not present

## 2016-08-15 DIAGNOSIS — E668 Other obesity: Secondary | ICD-10-CM | POA: Diagnosis not present

## 2016-08-15 MED ORDER — PERFLUTREN LIPID MICROSPHERE
1.0000 mL | INTRAVENOUS | Status: AC | PRN
  Administered 2016-08-15: 2 mL via INTRAVENOUS

## 2016-08-23 ENCOUNTER — Telehealth: Payer: Self-pay | Admitting: Internal Medicine

## 2016-08-23 NOTE — Telephone Encounter (Signed)
PT AWARE OF ECHO RESULTS./CY 

## 2016-08-23 NOTE — Telephone Encounter (Signed)
New Message     Ok to call home or cell, pt is returning Jennifers call to get lab results

## 2016-08-26 ENCOUNTER — Telehealth: Payer: Self-pay | Admitting: Physician Assistant

## 2016-08-26 NOTE — Telephone Encounter (Signed)
Agree. Thanks

## 2016-08-26 NOTE — Telephone Encounter (Signed)
Patient called spoke [place], [place], states she does not need the monitor she is no longer having symptoms. I have removed the order from system.   Davy Pique

## 2016-08-29 ENCOUNTER — Ambulatory Visit (INDEPENDENT_AMBULATORY_CARE_PROVIDER_SITE_OTHER): Payer: Medicare Other | Admitting: Internal Medicine

## 2016-08-29 ENCOUNTER — Encounter: Payer: Self-pay | Admitting: Internal Medicine

## 2016-08-29 VITALS — BP 130/86 | HR 80 | Ht 60.0 in | Wt 220.0 lb

## 2016-08-29 DIAGNOSIS — R0789 Other chest pain: Secondary | ICD-10-CM

## 2016-08-29 DIAGNOSIS — R0600 Dyspnea, unspecified: Secondary | ICD-10-CM

## 2016-08-29 DIAGNOSIS — R0609 Other forms of dyspnea: Secondary | ICD-10-CM | POA: Diagnosis not present

## 2016-08-29 DIAGNOSIS — R5383 Other fatigue: Secondary | ICD-10-CM

## 2016-08-29 DIAGNOSIS — R0683 Snoring: Secondary | ICD-10-CM

## 2016-08-29 MED ORDER — METOPROLOL TARTRATE 25 MG PO TABS: ORAL_TABLET | ORAL | 1 refills | Status: DC

## 2016-08-29 NOTE — Progress Notes (Signed)
Follow-up Outpatient Visit Date: 08/29/2016  Primary Care Provider: Fanny Bien, MD [address] [place] [address]  Chief Complaint: Dyspnea on exertion  HPI:  Michelle [name] is a 71 y.o. year-old female with history of hypertension, hyperlipidemia, irritable bowel syndrome, GERD, degenerative disc disease, and depression, who presents for follow-up of dyspnea on exertion. She was initially seen in our office one month ago by [facility]. She underwent transthoracic echocardiogram and myocardial perfusion stress testing in the meantime. No clear cause for her symptoms is identified (see details below).  Today, Michelle [name] reports that she continues to become very short of breath with modest activity such as climbing a flight of stairs. Even at rest, she notes some mild shortness of breath from time to time. She reports undergoing PFTs by her PCP this winter, which she states were normal. She also has occasional pain that begins in her upper abdomen and radiating from the lower part of her sternum up to her throat. The pain most commonly happens after she eats and when in bed; it is typically nonexertional.. This was present even before her dyspnea on exertion began in February, and previously responded to alprazolam. She notes that her PCP switched her from Zoloft to Lexapro, which also has seemed to help somewhat. She denies edema, lightheadedness, and dizziness. She feels as though her heart races when she is active and becoming short of breath. She has not worn an event monitor in the past. She was recently given an exercise prescription by her PCP, but has not started. She was also prescribed metoprolol to be taken as needed for palpitations, though she has not filled this prescription. She reports having gained about 30 pounds over the last year as well.  --------------------------------------------------------------------------------------------------  Cardiovascular History &  Procedures: Cardiovascular Problems:  Dyspnea on exertion  Atypical chest pain  Palpitations  Risk Factors:  Hypertension, hyperlipidemia, obesity, sedentary lifestyle, and age greater than 48  Cath/PCI:  None  CV Surgery:  None  EP Procedures and Devices:  None  Non-Invasive Evaluation(s):  TTE (08/15/16): Normal LV size and function with LVEF of 55-60%. Wall motion is normal with grade 1 diastolic dysfunction. Normal RV size and function. No significant valvular abnormalities.  Pharmacologic myocardial perfusion stress test (08/05/16): Moderate in size, mild in severity, mid anteroseptal, apical septal, mid anterior, and apical anterior defect that is not reversible most likely due to breast attenuation. There is also a small in size, moderate in severity mid and apical inferior defect that is fixed and likely represents diaphragmatic attenuation. A small in size, mild in severity, basal and mid inferolateral defect is reversible and may reflect ischemia versus variations in diaphragmatic and breast attenuation. Overall study is low risk. LVEF 55-65%.  Recent CV Pertinent Labs: Lab Results  Component Value Date   INR 0.91 07/24/2011   K 4.1 10/06/2015   BUN 19 10/06/2015   CREATININE 0.71 10/06/2015    Past medical and surgical history were reviewed and updated in EPIC.  Outpatient Encounter Prescriptions as of 08/29/2016  Medication Sig  . amoxicillin (AMOXIL) 250 MG capsule Take 250 mg by mouth 3 (three) times daily. ONLY FOR DENTAL APPTS  . Cholecalciferol (VITAMIN D PO) Take 1 tablet by mouth daily.  Marland Kitchen escitalopram (LEXAPRO) 20 MG tablet Take 20 mg by mouth daily.   Marland Kitchen losartan (COZAAR) 50 MG tablet Take 50 mg by mouth daily.   . metoprolol tartrate (LOPRESSOR) 25 MG tablet Take 0.5 tablets (  12.5 mg total) by mouth as needed (for palpitations). (Patient not taking: Reported on 08/29/2016)   No facility-administered encounter medications on file as of 08/29/2016.      Allergies: Patient has no known allergies.  Social History   Social History  . Marital status: Divorced    Spouse name: N/A  . Number of children: N/A  . Years of education: N/A   Occupational History  . Not on file.   Social History Main Topics  . Smoking status: Current Every Day Smoker    Packs/day: 0.25    Years: 30.00    Types: Cigarettes  . Smokeless tobacco: Never Used     Comment: less than 1/2 pack  . Alcohol use 0.0 oz/week     Comment: socially  . Drug use: No  . Sexual activity: No   Other Topics Concern  . Not on file   Social History Narrative  . No narrative on file    Family History  Problem Relation Age of Onset  . Heart disease Mother        Heart Disease before age 69  . Heart disease Father   . Hypertension Sister   . Colon cancer Neg Hx   . Esophageal cancer Neg Hx   . Rectal cancer Neg Hx   . Stomach cancer Neg Hx   . Pancreatic cancer Neg Hx     Review of Systems: Patient endorses history of snoring. She also has considerable fatigue throughout the day. Otherwise, a 12-system review of systems was performed and was negative except as noted in the HPI.  --------------------------------------------------------------------------------------------------  Physical Exam: BP 130/86 (BP Location: Right Arm, Patient Position: Sitting, Cuff Size: Large)   Pulse 80   Ht 5' (1.524 m)   Wt 220 lb (99.8 kg)   SpO2 96%   BMI 42.97 kg/m   General:  Morbidly obese woman, seated comfortably in the exam room. HEENT: No conjunctival pallor or scleral icterus.  Moist mucous membranes.  OP clear. Neck: Supple without lymphadenopathy, thyromegaly, JVD, or HJR, though evaluation is limited by body habitus. Lungs: Normal work of breathing.  Clear to auscultation bilaterally without wheezes or crackles. Heart: Regular rate and rhythm without murmurs, rubs, or gallops. Unable to assess PMI due to body habitus. Abd: Bowel sounds present.  Soft, NT/ND.  Unable to assess HSM due to body habitus. Ext: No lower extremity edema.  Radial, PT, and DP pulses are 2+ bilaterally. Skin: warm and dry without rash  Lab Results  Component Value Date   WBC 6.3 10/06/2015   HGB 15.6 (H) 10/06/2015   HCT 44.1 10/06/2015   MCV 96.3 10/06/2015   PLT 222 10/06/2015    Lab Results  Component Value Date   NA 137 10/06/2015   K 4.1 10/06/2015   CL 104 10/06/2015   CO2 25 10/06/2015   BUN 19 10/06/2015   CREATININE 0.71 10/06/2015   GLUCOSE 108 (H) 10/06/2015   ALT 47 10/06/2015    No results found for: CHOL, HDL, LDLCALC, LDLDIRECT, TRIG, CHOLHDL  --------------------------------------------------------------------------------------------------  ASSESSMENT AND PLAN: Dyspnea on exertion and atypical chest pain Symptoms are stable from last month. Echocardiogram was notable only for mild diastolic dysfunction. Myocardial perfusion stress test was low risk, though several defects were seen. The majority of defects were felt to be secondary to artifact. We discussed medical management and further evaluation for other causes of her symptoms versus proceeding with cardiac catheterization; Michelle [name] has opted for the former. We will start  metoprolol tartrate 12.5 mg twice a day as well as famotidine 20 mg daily for a possible component of GERD. I have also referred the patient for a sleep study.  Fatigue and snoring The symptoms are nonspecific but could reflect underlying sleep apnea. I have therefore referred Michelle [name] for a sleep study.  Follow-up: Return to clinic in 3 months, sooner if symptoms worsen.  Nelva Bush, MD 08/31/2016 6:36 PM

## 2016-08-29 NOTE — Patient Instructions (Signed)
Medication Instructions:  Start metoprolol tartrate 12.5 mg two times a day. This will be 1/2 of a 25mg  tablet two times a day.  Start famotidine (Pepcid) 20 mg daily  Labwork: None  Testing/Procedures: Your physician has recommended that you have a sleep study. This test records several body functions during sleep, including: brain activity, eye movement, oxygen and carbon dioxide blood levels, heart rate and rhythm, breathing rate and rhythm, the flow of air through your mouth and nose, snoring, body muscle movements, and chest and belly movement.    Follow-Up: Your physician recommends that you schedule a follow-up appointment in: 3 months with Dr End.        If you need a refill on your cardiac medications before your next appointment, please call your pharmacy.

## 2016-08-30 ENCOUNTER — Telehealth: Payer: Self-pay | Admitting: *Deleted

## 2016-08-30 NOTE — Telephone Encounter (Addendum)
Order ss  ESS=8 Informed patient of upcoming home sleep study and patient understanding was verbalized. Patient understands her sleep study is scheduled for Sunday November 03 2016. Patient understands her sleep study will be done at [facility] sleep lab. Patient understands she will receive a sleep packet in a week or so. Patient understands to call if she does not receive the sleep packet in a timely manner. Patient agrees with treatment and thanked me for call.

## 2016-08-30 NOTE — Telephone Encounter (Signed)
-----   Message from Katrine Coho, RN sent at 08/29/2016  2:06 PM EDT ----- Sleep Study-Dr Vito Backers is expecting your call.

## 2016-08-31 ENCOUNTER — Encounter: Payer: Self-pay | Admitting: Internal Medicine

## 2016-10-04 ENCOUNTER — Ambulatory Visit (INDEPENDENT_AMBULATORY_CARE_PROVIDER_SITE_OTHER): Payer: Medicare Other | Admitting: Physician Assistant

## 2016-10-04 ENCOUNTER — Encounter: Payer: Self-pay | Admitting: Physician Assistant

## 2016-10-04 VITALS — BP 123/83 | HR 80 | Temp 97.6°F | Resp 16

## 2016-10-04 DIAGNOSIS — R062 Wheezing: Secondary | ICD-10-CM

## 2016-10-04 DIAGNOSIS — R0981 Nasal congestion: Secondary | ICD-10-CM | POA: Diagnosis not present

## 2016-10-04 DIAGNOSIS — J209 Acute bronchitis, unspecified: Secondary | ICD-10-CM | POA: Diagnosis not present

## 2016-10-04 LAB — POCT CBC
GRANULOCYTE PERCENT: 41.5 % (ref 37–80)
HEMATOCRIT: 38.7 % (ref 37.7–47.9)
Hemoglobin: 13.5 g/dL (ref 12.2–16.2)
LYMPH, POC: 2.9 (ref 0.6–3.4)
MCH, POC: 34.3 pg — AB (ref 27–31.2)
MCHC: 34.9 g/dL (ref 31.8–35.4)
MCV: 98.5 fL — AB (ref 80–97)
MID (CBC): 0.6 (ref 0–0.9)
MPV: 6.1 fL (ref 0–99.8)
POC GRANULOCYTE: 2.4 (ref 2–6.9)
POC LYMPH %: 48.8 % (ref 10–50)
POC MID %: 9.7 % (ref 0–12)
Platelet Count, POC: 252 10*3/uL (ref 142–424)
RBC: 3.93 M/uL — AB (ref 4.04–5.48)
RDW, POC: 12.3 %
WBC: 5.9 10*3/uL (ref 4.6–10.2)

## 2016-10-04 MED ORDER — FLUTICASONE PROPIONATE 50 MCG/ACT NA SUSP: 2.0000 | Freq: Every day | NASAL | 0 refills | Status: DC

## 2016-10-04 MED ORDER — IPRATROPIUM BROMIDE 0.02 % IN SOLN
0.5000 mg | Freq: Once | RESPIRATORY_TRACT | Status: AC
  Administered 2016-10-04: 0.5 mg via RESPIRATORY_TRACT

## 2016-10-04 MED ORDER — PREDNISONE 10 MG PO TABS: ORAL_TABLET | ORAL | 0 refills | Status: DC

## 2016-10-04 MED ORDER — BENZONATATE 100 MG PO CAPS: 100.0000 mg | ORAL_CAPSULE | Freq: Three times a day (TID) | ORAL | 0 refills | Status: DC | PRN

## 2016-10-04 MED ORDER — ALBUTEROL SULFATE (2.5 MG/3ML) 0.083% IN NEBU
2.5000 mg | INHALATION_SOLUTION | Freq: Once | RESPIRATORY_TRACT | Status: AC
  Administered 2016-10-04: 2.5 mg via RESPIRATORY_TRACT

## 2016-10-04 NOTE — Addendum Note (Signed)
Addended by: Tenna Delaine D on: 10/04/2016 07:21 PM   Modules accepted: Level of Service

## 2016-10-04 NOTE — Patient Instructions (Addendum)
For wheezing and cough, please take prednisone as prescribed and use albuterol every 4-6 hours as needed. Side effects of prednisone can include nausea, vomiting, diarrhea, increased heart rate, increased blood pressure, and insomnia. Take in the morning with food to decrease the risk of these. Return to clinic if symptoms worsen, do not improve with treatment, or as needed. Thank you for letting me participate in your health and well being.     Acute Bronchitis, Adult Acute bronchitis is when air tubes (bronchi) in the lungs suddenly get swollen. The condition can make it hard to breathe. It can also cause these symptoms:  A cough.  Coughing up clear, yellow, or green mucus.  Wheezing.  Chest congestion.  Shortness of breath.  A fever.  Body aches.  Chills.  A sore throat.  Follow these instructions at home: Medicines  Take over-the-counter and prescription medicines only as told by your doctor.  If you were prescribed an antibiotic medicine, take it as told by your doctor. Do not stop taking the antibiotic even if you start to feel better. General instructions  Rest.  Drink enough fluids to keep your pee (urine) clear or pale yellow.  Avoid smoking and secondhand smoke. If you smoke and you need help quitting, ask your doctor. Quitting will help your lungs heal faster.  Use an inhaler, cool mist vaporizer, or humidifier as told by your doctor.  Keep all follow-up visits as told by your doctor. This is important. How is this prevented? To lower your risk of getting this condition again:  Wash your hands often with soap and water. If you cannot use soap and water, use hand sanitizer.  Avoid contact with people who have cold symptoms.  Try not to touch your hands to your mouth, nose, or eyes.  Make sure to get the flu shot every year.  Contact a doctor if:  Your symptoms do not get better in 2 weeks. Get help right away if:  You cough up blood.  You have  chest pain.  You have very bad shortness of breath.  You become dehydrated.  You faint (pass out) or keep feeling like you are going to pass out.  You keep throwing up (vomiting).  You have a very bad headache.  Your fever or chills gets worse. This information is not intended to replace advice given to you by your health care provider. Make sure you discuss any questions you have with your health care provider. Document Released: 09/25/2007 Document Revised: 11/15/2015 Document Reviewed: 09/27/2015 Elsevier Interactive Patient Education  2017 [employer].    We recommend that you schedule a mammogram for breast cancer screening. Typically, you do not need a referral to do this. Please contact a local imaging center to schedule your mammogram.  [facility] - [phone]  *ask for the Radiology Department [facility] - [phone] or [phone]  [facility] - [phone] [place] [phone] [facility] - [phone]  *ask for the [facility] - [phone]  *ask for the Radiology Department [facility] - [phone]  *ask for the [facility] - [phone]    IF you received an x-ray today, you will receive an invoice from [facility]. Please contact [facility] at [phone] with questions or concerns regarding your invoice.   IF you received labwork today, you will receive an invoice from [facility]. Please contact [facility] at  [phone] with questions or concerns regarding your invoice.   Our billing staff will not be able to assist you with questions regarding bills from these companies.  You will be contacted with the lab results as soon as they are available. The fastest way to get your results is to activate your My Chart account. Instructions are located on the last page of this  paperwork. If you have not heard from [place] regarding the results in 2 weeks, please contact this office.

## 2016-10-04 NOTE — Progress Notes (Signed)
MRN: [id number] DOB: 1945/06/28  Subjective:   Michelle [name] is a 71 y.o. female presenting for chief complaint of Cough (X 5 days); Nasal Congestion; Fatigue (X 5 days); and Diarrhea (X 1 day) .  Reports 5 day history of rhinorrhea, sore throat, dry cough (no hemoptysis), chills and fatigue. Has associated wheezing.  Has tried tessalon perles with moderate relief. Notes the sore throat has improved but the cough is still present.  Denies fever, sinus congestion, shortness of breath, chest pain, nausea and vomiting. Has not had  sick contact with anyone. No history of seasonal allergies, no history of asthma.  Denies smoking. Denies any other aggravating or relieving factors, no other questions or concerns.  [name] has a current medication list which includes the following prescription(s): benzonatate, cholecalciferol, escitalopram, losartan, and metoprolol tartrate. Also has No Known Allergies.  Michelle [name]  has a past medical history of Cataract; Chondromalacia of knee; DDD (degenerative disc disease) (07-24-11); Depression (07-24-11); adenomatous polyp of colon (03/21/2014); Hyperlipidemia; Hypertension; and IBS (irritable bowel syndrome). Also  has a past surgical history that includes Joint replacement (2011); Appendectomy; Cesarean section; Tubal ligation; Cataract extraction (07-24-11); Oophorectomy (07-24-11); Total knee arthroplasty (08/05/2011); Colonoscopy; and Cosmetic surgery.   Objective:   Vitals: BP 123/83 (BP Location: Right Arm, Patient Position: Sitting, Cuff Size: Large)   Pulse 80   Temp 97.6 F (36.4 C) (Oral)   Resp 16   SpO2 96%   Physical Exam  Constitutional: She is oriented to person, place, and time. She appears well-developed and well-nourished.  HENT:  Head: Normocephalic and atraumatic.  Right Ear: Tympanic membrane, external ear and ear canal normal.  Left Ear: Tympanic membrane, external ear and ear canal normal.  Nose: Mucosal edema (moderate bilaterally )  present. Right sinus exhibits no maxillary sinus tenderness and no frontal sinus tenderness. Left sinus exhibits no maxillary sinus tenderness and no frontal sinus tenderness.  Mouth/Throat: Uvula is midline, oropharynx is clear and moist and mucous membranes are normal. Tonsils are 1+ on the right. Tonsils are 1+ on the left. No tonsillar exudate.  Eyes: Conjunctivae are normal.  Neck: Normal range of motion.  Cardiovascular: Normal rate, regular rhythm and normal heart sounds.   Pulmonary/Chest: Effort normal. She has wheezes (intermittent scattered wheezes noted throughout lung field). She has no rales.  Lymphadenopathy:       Head (right side): No submental, no submandibular, no tonsillar, no preauricular, no posterior auricular and no occipital adenopathy present.       Head (left side): No submental, no submandibular, no tonsillar, no preauricular, no posterior auricular and no occipital adenopathy present.    She has no cervical adenopathy.       Right: No supraclavicular adenopathy present.       Left: No supraclavicular adenopathy present.  Neurological: She is alert and oriented to person, place, and time.  Skin: Skin is warm and dry.  Psychiatric: She has a normal mood and affect.  Vitals reviewed.   Results for orders placed or performed in visit on 10/04/16 (from the past 24 hour(s))  POCT CBC     Status: Abnormal   Collection Time: 10/04/16  6:17 PM  Result Value Ref Range   WBC 5.9 4.6 - 10.2 K/uL   Lymph, poc 2.9 0.6 - 3.4   POC LYMPH PERCENT 48.8 10 - 50 %L   MID (cbc) 0.6 0 - 0.9   POC MID % 9.7 0 - 12 %M   POC Granulocyte 2.4 2 -  6.9   Granulocyte percent 41.5 37 - 80 %G   RBC 3.93 (A) 4.04 - 5.48 M/uL   Hemoglobin 13.5 12.2 - 16.2 g/dL   HCT, POC 38.7 37.7 - 47.9 %   MCV 98.5 (A) 80 - 97 fL   MCH, POC 34.3 (A) 27 - 31.2 pg   MCHC 34.9 31.8 - 35.4 g/dL   RDW, POC 12.3 %   Platelet Count, POC 252 142 - 424 K/uL   MPV 6.1 0 - 99.8 fL   Post duoneb, wheezing  improved. Pt notes chest congestion improved.  Assessment and Plan :  1. Wheezing Improved  - POCT CBC - albuterol (PROVENTIL) (2.5 MG/3ML) 0.083% nebulizer solution 2.5 mg; Take 3 mLs (2.5 mg total) by nebulization once. - ipratropium (ATROVENT) nebulizer solution 0.5 mg; Take 2.5 mLs (0.5 mg total) by nebulization once.  2. Acute bronchitis with bronchospasm Likely viral etiology. Due to wheezing, will treat with steroid taper. Instructed to return to clinic if symptoms worsen, do not improve in 7-10 days, or as needed - benzonatate (TESSALON) 200 MG capsule; Take 200 mg by mouth 3 (three) times daily as needed for cough. - predniSONE (DELTASONE) 10 MG tablet; 6-5-4-3-2-1 taper. Take all the tablets for that day in the am with food.  Dispense: 21 tablet; Refill: 0 - benzonatate (TESSALON) 100 MG capsule; Take 1-2 capsules (100-200 mg total) by mouth 3 (three) times daily as needed for cough.  Dispense: 40 capsule; Refill: 0  3. Nasal congestion - fluticasone (FLONASE) 50 MCG/ACT nasal spray; Place 2 sprays into both nostrils daily.  Dispense: 16 g; Refill: 0  Tenna Delaine, PA-C  [facility] 10/04/2016 7:17 PM

## 2016-11-03 ENCOUNTER — Ambulatory Visit (HOSPITAL_BASED_OUTPATIENT_CLINIC_OR_DEPARTMENT_OTHER): Payer: Medicare Other | Attending: Internal Medicine | Admitting: Cardiology

## 2016-11-03 VITALS — Ht 61.5 in | Wt 220.0 lb

## 2016-11-03 DIAGNOSIS — R0600 Dyspnea, unspecified: Secondary | ICD-10-CM

## 2016-11-03 DIAGNOSIS — E669 Obesity, unspecified: Secondary | ICD-10-CM | POA: Diagnosis not present

## 2016-11-03 DIAGNOSIS — G4736 Sleep related hypoventilation in conditions classified elsewhere: Secondary | ICD-10-CM | POA: Insufficient documentation

## 2016-11-03 DIAGNOSIS — R5383 Other fatigue: Secondary | ICD-10-CM | POA: Diagnosis not present

## 2016-11-03 DIAGNOSIS — G4733 Obstructive sleep apnea (adult) (pediatric): Secondary | ICD-10-CM | POA: Insufficient documentation

## 2016-11-03 DIAGNOSIS — R0683 Snoring: Secondary | ICD-10-CM

## 2016-11-03 DIAGNOSIS — Z6841 Body Mass Index (BMI) 40.0 and over, adult: Secondary | ICD-10-CM | POA: Diagnosis not present

## 2016-11-03 DIAGNOSIS — R0609 Other forms of dyspnea: Secondary | ICD-10-CM

## 2016-11-07 ENCOUNTER — Telehealth: Payer: Self-pay | Admitting: *Deleted

## 2016-11-07 DIAGNOSIS — G4733 Obstructive sleep apnea (adult) (pediatric): Secondary | ICD-10-CM

## 2016-11-07 NOTE — Procedures (Signed)
   Patient Name: Gianah, Batt Date: 11/03/2016 Gender: Female D.O.B: February 18, 1946 Age (years): 40 Referring Provider: Nelva Bush MD Height (inches): 62 Interpreting Physician: Fransico Him MD, ABSM Weight (lbs): 220 RPSGT: Baxter Flattery BMI: 41 MRN: [id number] Neck Size: 16.50  CLINICAL INFORMATION Sleep Study Type: NPSG  Indication for sleep study: Fatigue, Obesity, OSA, Snoring, Witnessed Apneas  Epworth Sleepiness Score: 6  SLEEP STUDY TECHNIQUE As per the AASM Manual for the Scoring of Sleep and Associated Events v2.3 (April 2016) with a hypopnea requiring 4% desaturations.  The channels recorded and monitored were frontal, central and occipital EEG, electrooculogram (EOG), submentalis EMG (chin), nasal and oral airflow, thoracic and abdominal wall motion, anterior tibialis EMG, snore microphone, electrocardiogram, and pulse oximetry.  MEDICATIONS Medications self-administered by patient taken the night of the study : N/A  SLEEP ARCHITECTURE The study was initiated at 10:09:02 PM and ended at 4:44:34 AM.  Sleep onset time was 11.6 minutes and the sleep efficiency was 71.8%. The total sleep time was 284.0 minutes.  Stage REM latency was N/A minutes.  The patient spent 13.20% of the night in stage N1 sleep, 86.80% in stage N2 sleep, 0.00% in stage N3 and 0.00% in REM.  Alpha intrusion was absent.  Supine sleep was 55.28%.  RESPIRATORY PARAMETERS The overall apnea/hypopnea index (AHI) was 27.0 per hour. There were 26 total apneas, including 25 obstructive, 1 central and 0 mixed apneas. There were 102 hypopneas and 5 RERAs.  The AHI during Stage REM sleep was N/A per hour.  AHI while supine was 37.5 per hour.  The mean oxygen saturation was 89.91%. The minimum SpO2 during sleep was 82.00%.  Loud snoring was noted during this study.  CARDIAC DATA The 2 lead EKG demonstrated sinus rhythm. The mean heart rate was 86.72 beats per minute. Other EKG  findings include: None.  LEG MOVEMENT DATA The total PLMS were 101 with a resulting PLMS index of 21.34. Associated arousal with leg movement index was 0.2 .  IMPRESSIONS - Moderate obstructive sleep apnea occurred during this study (AHI = 27.0/h). - No significant central sleep apnea occurred during this study (CAI = 0.2/h). - Mild oxygen desaturation was noted during this study (Min O2 = 82.00%). - The patient snored with Loud snoring volume. - No cardiac abnormalities were noted during this study. - Mild periodic limb movements of sleep occurred during the study. No significant associated arousals.  DIAGNOSIS - Obstructive Sleep Apnea (327.23 [G47.33 ICD-10]) - Nocturnal Hypoxemia (327.26 [G47.36 ICD-10])  RECOMMENDATIONS - Therapeutic CPAP titration to determine optimal pressure required to alleviate sleep disordered breathing. - Positional therapy avoiding supine position during sleep. - Avoid alcohol, sedatives and other CNS depressants that may worsen sleep apnea and disrupt normal sleep architecture. - Sleep hygiene should be reviewed to assess factors that may improve sleep quality. - Weight management and regular exercise should be initiated or continued if appropriate.  [place], American Board of Sleep Medicine  ELECTRONICALLY SIGNED ON:  11/07/2016, 10:54 AM [place] PH: [phone]   FX: [phone] [place]

## 2016-11-07 NOTE — Telephone Encounter (Addendum)
Informed patient of sleep study results and patient understanding was verbalized. Patient understands Dr Radford Pax recommends a CPAP titration. Patient understands her test is scheduled for Wednesday January 08 2017. Patient understands her titration study will be done at [facility] sleep lab. Patient understands she will receive a sleep packet in a week or so. Patient understands to call if she does not receive the sleep packet in a timely manner. Patient agrees with treatment and thanked me for call

## 2016-11-07 NOTE — Telephone Encounter (Signed)
-----   Message from Sueanne Margarita, MD sent at 11/07/2016 10:55 AM EDT ----- Please let patient know that they have sleep apnea and recommend CPAP titration. Please set up titration in the sleep lab.

## 2016-11-12 ENCOUNTER — Encounter: Payer: Self-pay | Admitting: *Deleted

## 2016-12-06 ENCOUNTER — Ambulatory Visit (INDEPENDENT_AMBULATORY_CARE_PROVIDER_SITE_OTHER): Payer: Medicare Other | Admitting: Internal Medicine

## 2016-12-06 ENCOUNTER — Encounter: Payer: Self-pay | Admitting: Internal Medicine

## 2016-12-06 ENCOUNTER — Encounter (INDEPENDENT_AMBULATORY_CARE_PROVIDER_SITE_OTHER): Payer: Self-pay

## 2016-12-06 VITALS — BP 104/70 | HR 94 | Ht 61.5 in | Wt 228.4 lb

## 2016-12-06 DIAGNOSIS — R0609 Other forms of dyspnea: Secondary | ICD-10-CM | POA: Diagnosis not present

## 2016-12-06 DIAGNOSIS — R0789 Other chest pain: Secondary | ICD-10-CM | POA: Diagnosis not present

## 2016-12-06 DIAGNOSIS — I10 Essential (primary) hypertension: Secondary | ICD-10-CM | POA: Diagnosis not present

## 2016-12-06 DIAGNOSIS — G4733 Obstructive sleep apnea (adult) (pediatric): Secondary | ICD-10-CM | POA: Diagnosis not present

## 2016-12-06 DIAGNOSIS — R0600 Dyspnea, unspecified: Secondary | ICD-10-CM

## 2016-12-06 MED ORDER — LOSARTAN POTASSIUM 25 MG PO TABS: 25.0000 mg | ORAL_TABLET | Freq: Every day | ORAL | 1 refills | Status: DC

## 2016-12-06 MED ORDER — FUROSEMIDE 20 MG PO TABS: ORAL_TABLET | ORAL | 2 refills | Status: DC

## 2016-12-06 NOTE — Progress Notes (Signed)
Follow-up Outpatient Visit Date: 12/06/2016  Primary Care Provider: Fanny Bien, MD [address] [place] [address]  Chief Complaint: Shortness of breath  HPI:  Michelle [name] is a 71 y.o. year-old female with history of hypertension, hyperlipidemia, irritable bowel syndrome, GERD, degenerative disc disease, and depression, who presents for follow-up of shortness of breath. I last saw her in May, at which time we agreed to start metoprolol and famotidine as well as refer Michelle [name] for a sleep study. She never started metoprolol, as she did not wish to take another medicine and thought that it likely would not help. Sleep study revealed sleep apnea; CPAP titration has been scheduled for next month.  Overall, Michelle [name] reports feeling better with less anxiety. She attributes this to increasing her Lexapro to 40 mg daily. She continues to have significant exertional dyspnea with moderate to strenuous activities. She also has a nocturnal cough that is productive of sputum. She does not have any cough during the daytime. She denies chest pain, palpitations, lightheadedness, orthopnea, PND, and edema.  --------------------------------------------------------------------------------------------------  Cardiovascular History & Procedures: Cardiovascular Problems:  Dyspnea on exertion  Atypical chest pain  Palpitations  Risk Factors:  Hypertension, hyperlipidemia, obesity, sedentary lifestyle, and age greater than 18  Cath/PCI:  None  CV Surgery:  None  EP Procedures and Devices:  None  Non-Invasive Evaluation(s):  TTE (08/15/16): Normal LV size and function with LVEF of 55-60%. Wall motion is normal with grade 1 diastolic dysfunction. Normal RV size and function. No significant valvular abnormalities.  Pharmacologic myocardial perfusion stress test (08/05/16): Moderate in size, mild in severity, mid anteroseptal, apical septal, mid anterior, and apical anterior  defect that is not reversible most likely due to breast attenuation. There is also a small in size, moderate in severity mid and apical inferior defect that is fixed and likely represents diaphragmatic attenuation. A small in size, mild in severity, basal and mid inferolateral defect is reversible and may reflect ischemia versus variations in diaphragmatic and breast attenuation. Overall study is low risk. LVEF 55-65%.  Recent CV Pertinent Labs: Lab Results  Component Value Date   INR 0.91 07/24/2011   K 5.4 (H) 12/06/2016   BUN 32 (H) 12/06/2016   CREATININE 0.93 12/06/2016    Past medical and surgical history were reviewed and updated in EPIC.  Current Meds  Medication Sig  . benzonatate (TESSALON) 100 MG capsule Take 1-2 capsules (100-200 mg total) by mouth 3 (three) times daily as needed for cough.  . Cholecalciferol (VITAMIN D PO) Take 1 tablet by mouth daily.  Marland Kitchen escitalopram (LEXAPRO) 20 MG tablet Take 40 mg by mouth daily.   . [DISCONTINUED] losartan (COZAAR) 50 MG tablet Take 50 mg by mouth daily.     Allergies: Patient has no known allergies.  Social History   Social History  . Marital status: Divorced    Spouse name: N/A  . Number of children: N/A  . Years of education: N/A   Occupational History  . Not on file.   Social History Main Topics  . Smoking status: Former Smoker    Packs/day: 0.25    Years: 30.00    Types: Cigarettes    Quit date: 2016  . Smokeless tobacco: Never Used  . Alcohol use 4.8 oz/week    8 Glasses of wine per week     Comment: socially  . Drug use: No  . Sexual activity: No   Other Topics Concern  . Not on  file   Social History Narrative  . No narrative on file    Family History  Problem Relation Age of Onset  . Heart disease Mother        Rheumatic heart disease  . Pulmonary embolism Mother   . Heart disease Father        valvular heart disease  . Stroke Father   . Hypertension Sister   . Colon cancer Neg Hx   .  Esophageal cancer Neg Hx   . Rectal cancer Neg Hx   . Stomach cancer Neg Hx   . Pancreatic cancer Neg Hx     Review of Systems: A 12-system review of systems was performed and was negative except as noted in the HPI.  --------------------------------------------------------------------------------------------------  Physical Exam: BP 104/70   Pulse 94   Ht 5' 1.5" (1.562 m)   Wt 228 lb 6.4 oz (103.6 kg)   SpO2 97%   BMI 42.46 kg/m   General:  Morbidly obese woman, seated comfortably in the exam room. HEENT: No conjunctival pallor or scleral icterus.  Moist mucous membranes.  OP clear. Neck: Supple without lymphadenopathy or thyromegaly. No gross JVD or HJR, though body habitus limits evaluation. Lungs: Normal work of breathing.  Clear to auscultation bilaterally without wheezes or crackles. Heart: Regular rate and rhythm without murmurs, rubs, or gallops.  Unable to assess PMI due to body habitus. Abd: Bowel sounds present.  Soft, NT/ND. Unable to assess hepatosplenomegaly due to body habitus. Extremities: No lower extremity edema.  Radial, PT, and DP pulses are 2+ bilaterally. Skin: Warm and dry without rash.  Lab Results  Component Value Date   WBC 5.9 10/04/2016   HGB 13.5 10/04/2016   HCT 38.7 10/04/2016   MCV 98.5 (A) 10/04/2016   PLT 222 10/06/2015    Lab Results  Component Value Date   NA 140 12/06/2016   K 5.4 (H) 12/06/2016   CL 102 12/06/2016   CO2 22 12/06/2016   BUN 32 (H) 12/06/2016   CREATININE 0.93 12/06/2016   GLUCOSE 151 (H) 12/06/2016   ALT 47 10/06/2015    No results found for: CHOL, HDL, LDLCALC, LDLDIRECT, TRIG, CHOLHDL  --------------------------------------------------------------------------------------------------  ASSESSMENT AND PLAN: Dyspnea on exertion and atypical chest pain Dyspnea overall is unchanged. Chest pain has resolved without other interventions. The metoprolol that we discussed this at our last visit was never started by  the patient. We have discussed the rationale behind beta blockade, and Michelle [name] has agreed to give this a try. In light of her borderline low blood pressure today, we will decrease losartan to 25 mg daily. We discussed further evaluation with left and right heart catheterization versus medical therapy and follow-up after she has been using CPAP for a few weeks. Michelle [name] opted for conservative therapy and follow-up.I asked her to contact us if symptoms worsen.  Hypertension Blood pressure is in light of starting low-dose metoprolol, we will decrease losartan to 25 mg daily.  Obstructive sleep apnea Recent sleep study demonstrated moderate sleep apnea. CPAP titration is pending. I encouraged Michelle [name] to move forward with this.  Follow-up: Return to clinic in 3 months.  Nelva Bush, MD 12/07/2016 4:44 PM

## 2016-12-06 NOTE — Patient Instructions (Addendum)
Medication Instructions:  Decrease losartan to 25 mg daily. You can take 1/2 of your 50 mg tablet daily and use your current supply.  Take metoprolol 12.5 mg two times a day. This will be 1/2 of a 25 mg tablet two times a day.  Use lasix (furosemide) 20 mg daily AS NEEDED for swelling.  Labwork: Your physician recommends that you have lab today--BMET.   Testing/Procedures: none  Follow-Up: Your physician recommends that you schedule a follow-up appointment in: 3 months with Dr End.   Any Other Special Instructions Will Be Listed Below (If Applicable).     If you need a refill on your cardiac medications before your next appointment, please call your pharmacy.

## 2016-12-07 ENCOUNTER — Encounter: Payer: Self-pay | Admitting: Internal Medicine

## 2016-12-07 DIAGNOSIS — Z9989 Dependence on other enabling machines and devices: Secondary | ICD-10-CM

## 2016-12-07 DIAGNOSIS — I10 Essential (primary) hypertension: Secondary | ICD-10-CM | POA: Insufficient documentation

## 2016-12-07 DIAGNOSIS — G4733 Obstructive sleep apnea (adult) (pediatric): Secondary | ICD-10-CM | POA: Insufficient documentation

## 2016-12-07 HISTORY — DX: Obstructive sleep apnea (adult) (pediatric): G47.33

## 2016-12-07 HISTORY — DX: Dependence on other enabling machines and devices: Z99.89

## 2016-12-07 LAB — BASIC METABOLIC PANEL
BUN/Creatinine Ratio: 34 — ABNORMAL HIGH (ref 12–28)
BUN: 32 mg/dL — AB (ref 8–27)
CALCIUM: 9.4 mg/dL (ref 8.7–10.3)
CHLORIDE: 102 mmol/L (ref 96–106)
CO2: 22 mmol/L (ref 20–29)
Creatinine, Ser: 0.93 mg/dL (ref 0.57–1.00)
GFR calc Af Amer: 73 mL/min/{1.73_m2} (ref >59)
GFR calc non Af Amer: 63 mL/min/{1.73_m2} (ref >59)
GLUCOSE: 151 mg/dL — AB (ref 65–99)
Potassium: 5.4 mmol/L — ABNORMAL HIGH (ref 3.5–5.2)
Sodium: 140 mmol/L (ref 134–144)

## 2017-01-06 DIAGNOSIS — R635 Abnormal weight gain: Secondary | ICD-10-CM | POA: Diagnosis not present

## 2017-01-08 ENCOUNTER — Ambulatory Visit (HOSPITAL_BASED_OUTPATIENT_CLINIC_OR_DEPARTMENT_OTHER): Payer: Medicare Other | Attending: Cardiology | Admitting: Cardiology

## 2017-01-08 VITALS — Ht 62.0 in | Wt 220.0 lb

## 2017-01-08 DIAGNOSIS — G4734 Idiopathic sleep related nonobstructive alveolar hypoventilation: Secondary | ICD-10-CM | POA: Diagnosis not present

## 2017-01-08 DIAGNOSIS — G4733 Obstructive sleep apnea (adult) (pediatric): Secondary | ICD-10-CM | POA: Diagnosis not present

## 2017-01-27 DIAGNOSIS — F411 Generalized anxiety disorder: Secondary | ICD-10-CM | POA: Diagnosis not present

## 2017-01-27 DIAGNOSIS — Z6841 Body Mass Index (BMI) 40.0 and over, adult: Secondary | ICD-10-CM | POA: Diagnosis not present

## 2017-01-27 DIAGNOSIS — F331 Major depressive disorder, recurrent, moderate: Secondary | ICD-10-CM | POA: Diagnosis not present

## 2017-01-30 ENCOUNTER — Telehealth: Payer: Self-pay | Admitting: Cardiology

## 2017-01-30 NOTE — Telephone Encounter (Signed)
Have attempted to c/b to discuss pts concerns but keep receiving a fast busy signal when trying to call the patient back.

## 2017-01-30 NOTE — Telephone Encounter (Signed)
New Message  Pt call requesting to speak with RN about titration appt on 9/19. Please call back to discuss

## 2017-01-31 NOTE — Telephone Encounter (Signed)
Left message on cell phone to call office.

## 2017-02-03 NOTE — Telephone Encounter (Signed)
Pt states she needs CPAP machine, I will forward to Gae Bon to follow-up with patient about OSA equipment.

## 2017-02-03 NOTE — Telephone Encounter (Signed)
Left message for patient to call back if further assistance is needed

## 2017-02-04 NOTE — Telephone Encounter (Signed)
Patient called wanting to know if she would be getting a cpap machine.  Patient was informed that Dr Radford Pax will read her CPAP titration and the CPAP assistant will call her with results.

## 2017-02-06 NOTE — Procedures (Signed)
   Patient Name: Michelle [name], Michelle [name] Date: 01/08/2017 Gender: Female D.O.B: 1946/03/09 Age (years): 85 Referring Provider: Fransico Him MD, ABSM Height (inches): 62 Interpreting Physician: Fransico Him MD, ABSM Weight (lbs): 220 RPSGT: Jorge Ny BMI: 41 MRN: [id number] Neck Size: 16.50  CLINICAL INFORMATION The patient is referred for a CPAP titration to treat sleep apnea.  SLEEP STUDY TECHNIQUE As per the AASM Manual for the Scoring of Sleep and Associated Events v2.3 (April 2016) with a hypopnea requiring 4% desaturations.  The channels recorded and monitored were frontal, central and occipital EEG, electrooculogram (EOG), submentalis EMG (chin), nasal and oral airflow, thoracic and abdominal wall motion, anterior tibialis EMG, snore microphone, electrocardiogram, and pulse oximetry. Continuous positive airway pressure (CPAP) was initiated at the beginning of the study and titrated to treat sleep-disordered breathing.  MEDICATIONS Medications self-administered by patient taken the night of the study : N/A  TECHNICIAN COMMENTS Comments added by technician: NONE  Comments added by scorer: N/A  RESPIRATORY PARAMETERS Optimal PAP Pressure (cm): 14  AHI at Optimal Pressure (/hr):3.2 Overall Minimal O2 (%):81.00  Supine % at Optimal Pressure (%): 100 Minimal O2 at Optimal Pressure (%): 84.0    SLEEP ARCHITECTURE The study was initiated at 10:32:40 PM and ended at 5:08:20 AM.  Sleep onset time was 39.9 minutes and the sleep efficiency was 76.8%. The total sleep time was 304.0 minutes.  The patient spent 5.76% of the night in stage N1 sleep, 84.87% in stage N2 sleep, 0.00% in stage N3 and 9.38% in REM.Stage REM latency was 199.0 minutes  Wake after sleep onset was 51.8. Alpha intrusion was absent. Supine sleep was 74.62%.  CARDIAC DATA The 2 lead EKG demonstrated sinus rhythm. The mean heart rate was 81.92 beats per minute. Other EKG findings include: PVCs.  LEG  MOVEMENT DATA The total Periodic Limb Movements of Sleep (PLMS) were 383. The PLMS index was 75.59. A PLMS index of <15 is considered normal in adults.  IMPRESSIONS - The optimal PAP pressure was 14 cm of water. - Central sleep apnea was not noted during this titration (CAI = 0.4/h). - Moderate oxygen desaturations were observed during this titration (min O2 = 81.00%). - The patient snored with moderate snoring volume during this titration study. - 2-lead EKG demonstrated: PVCs - Severe periodic limb movements were observed during this study. Arousals associated with PLMs were significant.  DIAGNOSIS - Obstructive Sleep Apnea (327.23 [G47.33 ICD-10]) - Nocturnal Hypoxemia  RECOMMENDATIONS - Trial of CPAP therapy on 14 cm H2O with a Small size Resmed Full Face Mask AirTouch mask and heated humidification. - Recommend addition of O2 at 2L via CPAP nightly.  Check oxygen saturations on 2L and CPAP overnight - Avoid alcohol, sedatives and other CNS depressants that may worsen sleep apnea and disrupt normal sleep architecture. - Sleep hygiene should be reviewed to assess factors that may improve sleep quality. - Weight management and regular exercise should be initiated or continued. - Return to Sleep Center for re-evaluation after 10 weeks of therapy  [place], [place] [facility]  ELECTRONICALLY SIGNED ON:  02/06/2017, 2:40 PM [place] PH: [phone]   FX: [phone] [place]

## 2017-02-07 ENCOUNTER — Telehealth: Payer: Self-pay | Admitting: *Deleted

## 2017-02-07 DIAGNOSIS — R0902 Hypoxemia: Secondary | ICD-10-CM

## 2017-02-07 NOTE — Telephone Encounter (Signed)
-----   Message from Sueanne Margarita, MD sent at 02/06/2017  2:43 PM EDT ----- Please let patient know that they had a successful PAP titration and let DME know that orders are in EPIC.  Please set up 10 week OV with me. Also let patient know that she had persistent hypoxemia on CPAP and we will be adding 2L O2 via CPAP at night. Please get an overnight pulse ox on CPAP and O2

## 2017-02-07 NOTE — Telephone Encounter (Signed)
LMTCB

## 2017-02-11 NOTE — Telephone Encounter (Signed)
Informed patient of titration results and verbalized understanding was indicated. Patient understands she had a successful CPAP Titration and Dr Radford Pax has ordered her a new CPAP in EPIC. Patient understands she has persistent oxygen drops on CPAP and Dr Radford Pax will be adding 2L 02 with her CPAP at night. Patient understands [place] will get an overnight pulse ox on CPAP and 02. Patient understands she will be contacted by [facility] to set up her cpap. She understands to call if [facility] does not contact her with new setup in a timely manner. She understands she will be called once confirmation has been received from [facility] that she has received her new machine to schedule 10 week follow up appointment.  [place] notified of new cpap orders in epic Please add to airview She was grateful for the call and thanked me

## 2017-02-19 DIAGNOSIS — G4733 Obstructive sleep apnea (adult) (pediatric): Secondary | ICD-10-CM | POA: Diagnosis not present

## 2017-02-20 NOTE — Addendum Note (Signed)
Addended by: Freada Bergeron on: 02/20/2017 10:23 AM   Modules accepted: Orders

## 2017-02-20 NOTE — Telephone Encounter (Addendum)
RE: ONO order on room air  Turner, Eber Hong, MD  Freada Bergeron, CMA        Please get an overnight pulse ox on CPAP and RA   Traci Turner     ----- Message -----  From: Freada Bergeron, CMA  Sent: 02/12/2017 10:39 AM  To: Sueanne Margarita, MD  Subject: ONO order on room air               Per [facility] need an order for ONO on CPAP on room air to qualify patient for 02. Everything else is good.  The titration is past the 30 days per insurance but she can still qualify if the ONO is done on room air first then they go back and qualify her on Cpap and 02.  Thanks,  Gae Bon

## 2017-03-03 ENCOUNTER — Encounter: Payer: Self-pay | Admitting: Cardiology

## 2017-03-03 DIAGNOSIS — J449 Chronic obstructive pulmonary disease, unspecified: Secondary | ICD-10-CM | POA: Diagnosis not present

## 2017-03-03 DIAGNOSIS — Z6841 Body Mass Index (BMI) 40.0 and over, adult: Secondary | ICD-10-CM | POA: Diagnosis not present

## 2017-03-03 DIAGNOSIS — Z01419 Encounter for gynecological examination (general) (routine) without abnormal findings: Secondary | ICD-10-CM | POA: Diagnosis not present

## 2017-03-03 DIAGNOSIS — R0902 Hypoxemia: Secondary | ICD-10-CM | POA: Diagnosis not present

## 2017-03-03 DIAGNOSIS — Z124 Encounter for screening for malignant neoplasm of cervix: Secondary | ICD-10-CM | POA: Diagnosis not present

## 2017-03-03 DIAGNOSIS — Z1231 Encounter for screening mammogram for malignant neoplasm of breast: Secondary | ICD-10-CM | POA: Diagnosis not present

## 2017-03-09 IMAGING — CT CT ABD-PELV W/O CM
1 of 2 series · 15 of 32 positions shown, 19 images · non-contrast
Comparison: 10/06/2015

CLINICAL DATA: Abdominal pain.  Diverticulitis.

EXAM:
CT ABDOMEN AND PELVIS WITHOUT CONTRAST
TECHNIQUE: Multidetector CT imaging of the abdomen and pelvis was performed
following the standard protocol without IV contrast.

[Series 2: abd/pelvis w/(date) · axial · 0.93mm/px · z∈[+680,+1105]mm · 15 of 93 slices shown, 19 images]
[im 4/93  soft-tissue]
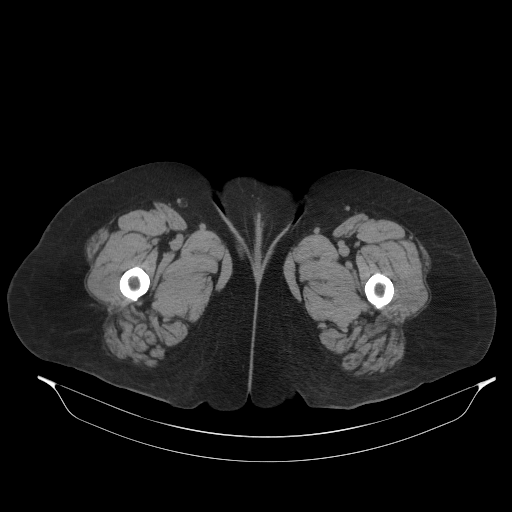
[im 4/93  bone]
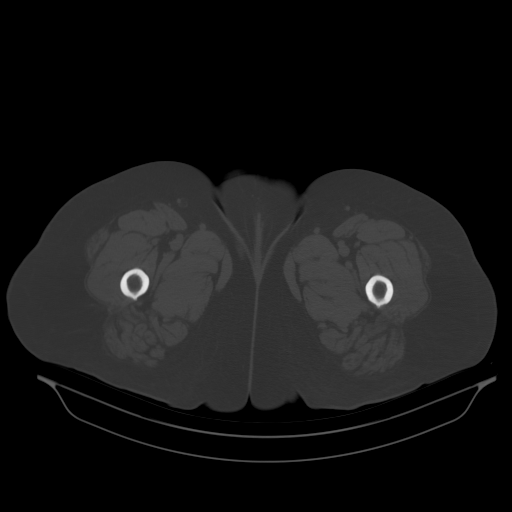
[im 12/93  soft-tissue]
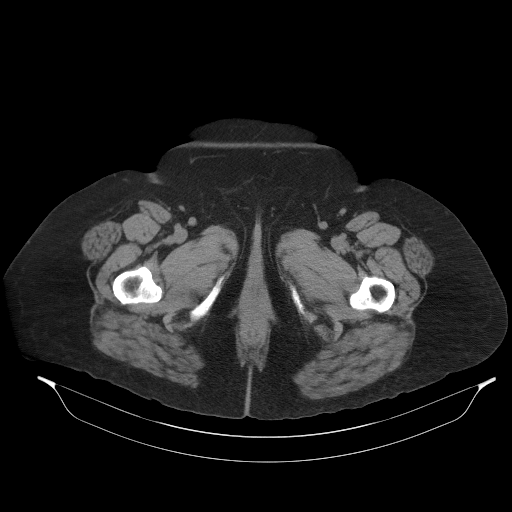
[im 19/93  soft-tissue]
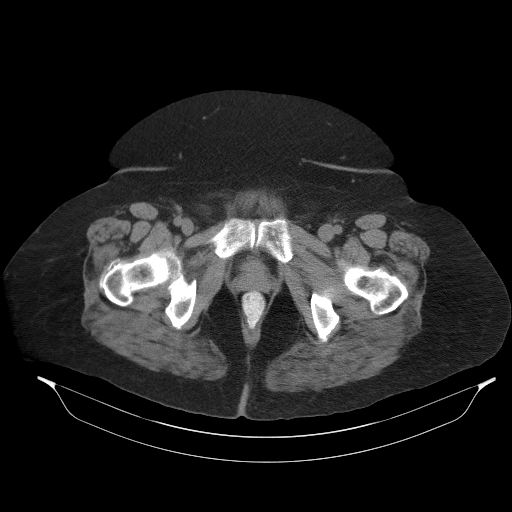
[im 26/93  soft-tissue]
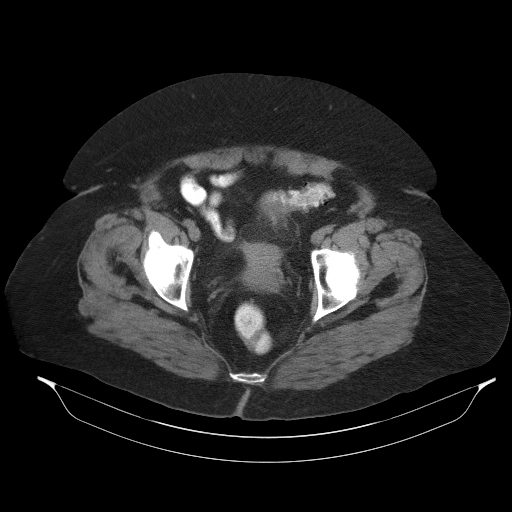
[im 34/93  soft-tissue]
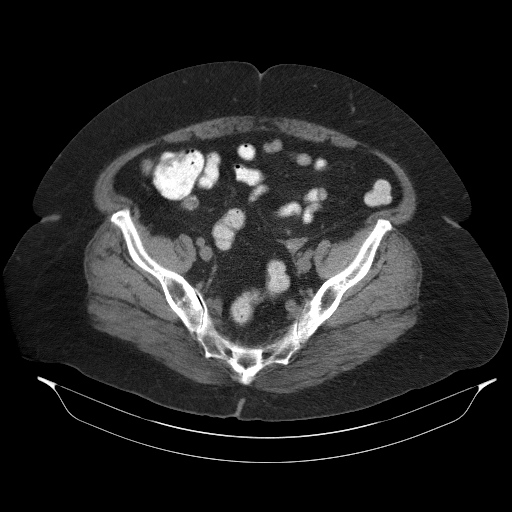
[im 41/93  soft-tissue]
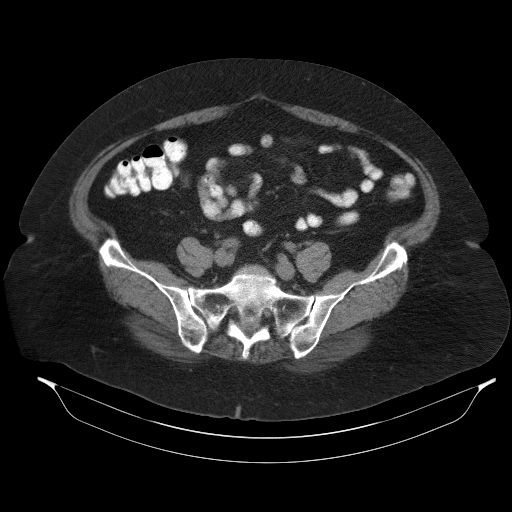
[im 48/93  soft-tissue]
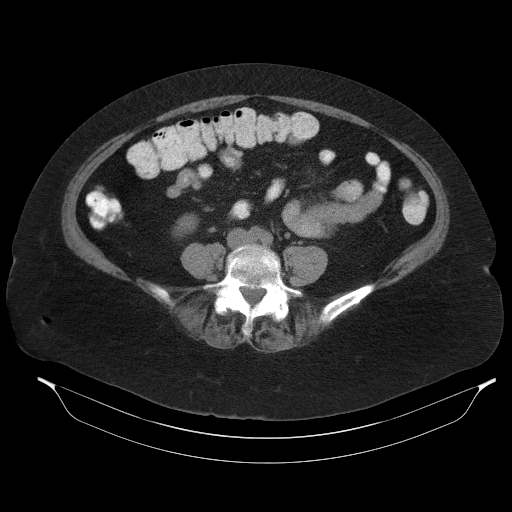
[im 52/93  soft-tissue]
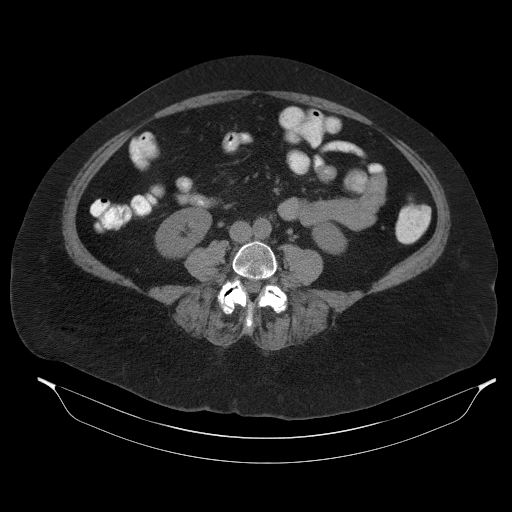
[im 59/93  soft-tissue]
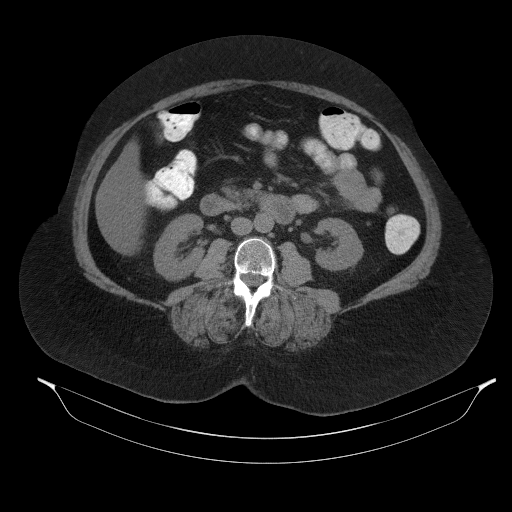
[im 59/93  bone]
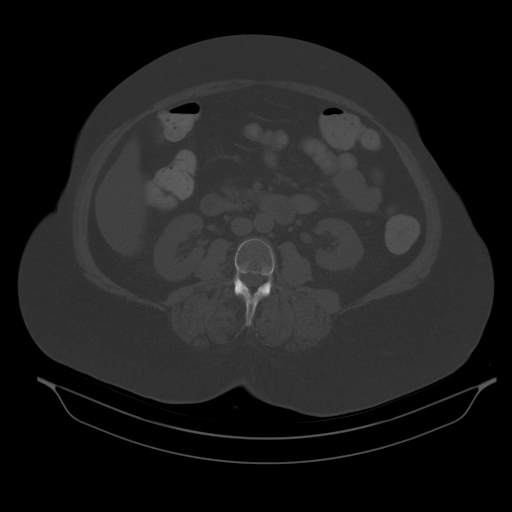
[im 67/93  soft-tissue]
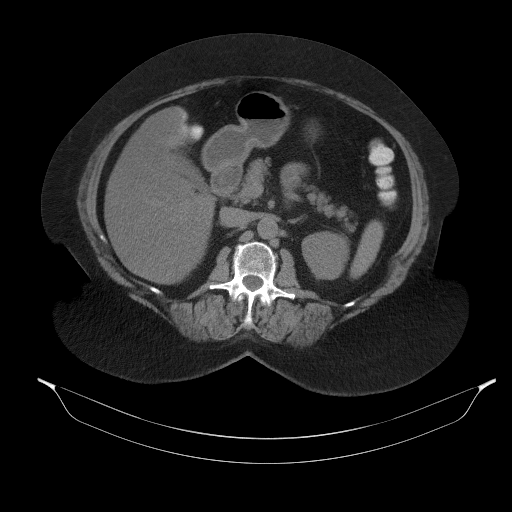
[im 74/93  soft-tissue]
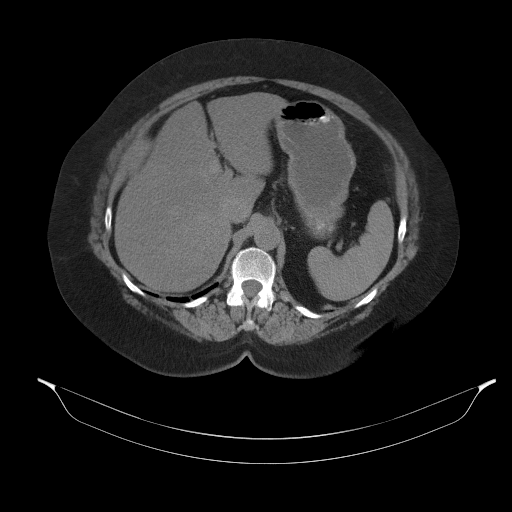
[im 78/93  lung]
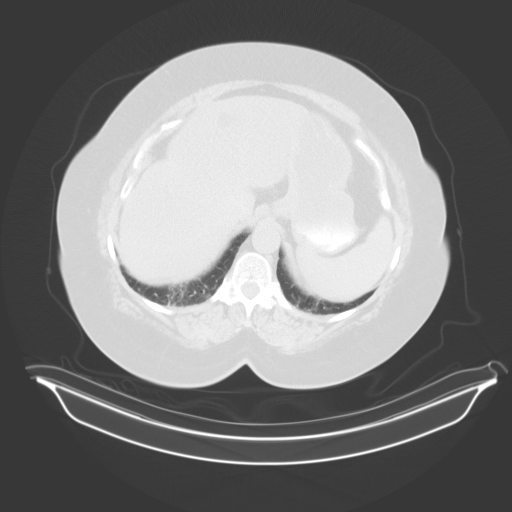
[im 81/93  soft-tissue]
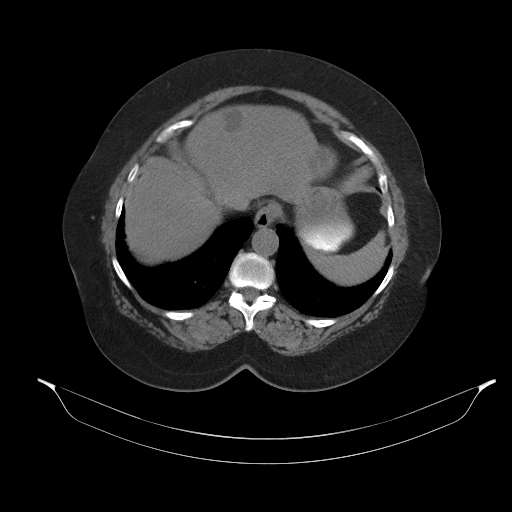
[im 81/93  lung]
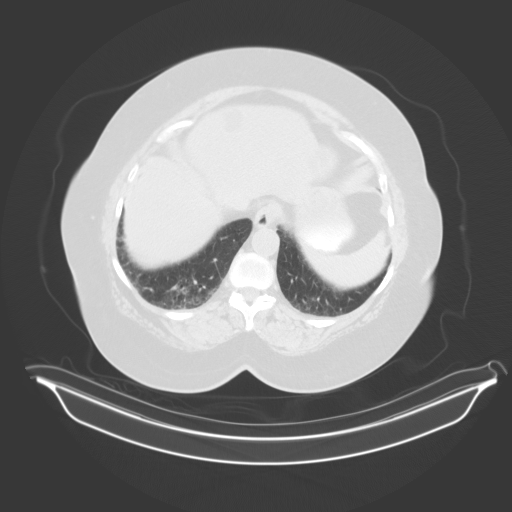
[im 85/93  lung]
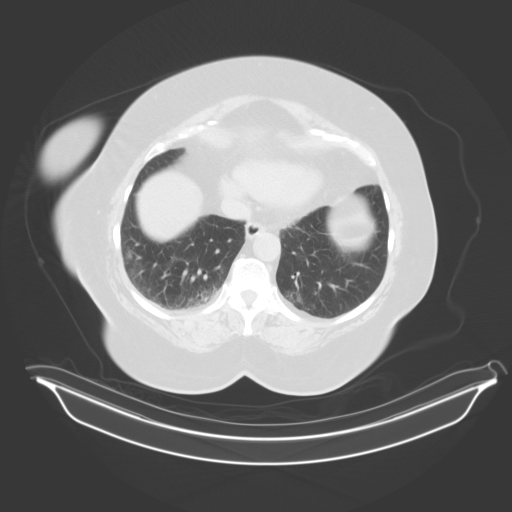
[im 89/93  soft-tissue]
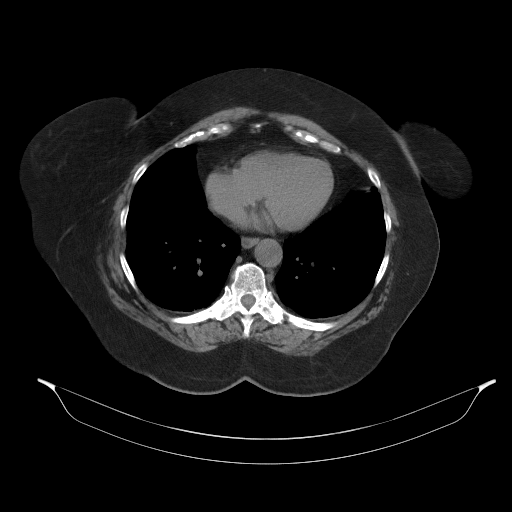
[im 89/93  lung]
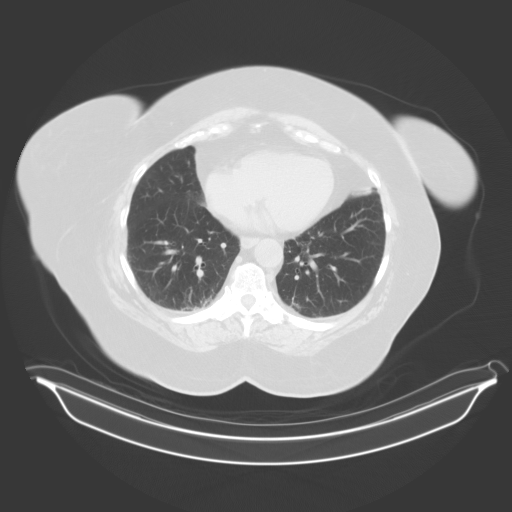

[15 of 32 positions shown; findings below may reference images not displayed]

FINDINGS: Lower chest:  No contributory findings.

Hepatobiliary: Lobulated left hepatic cysts measuring up to 28 mm.
Hepatic steatosis with central sparing.No evidence of biliary
obstruction or stone.

Pancreas: Unremarkable.

Spleen: Unremarkable.

Adrenals/Urinary Tract: Negative adrenals. No hydronephrosis or
stone. Unremarkable bladder.

Stomach/Bowel: Colonic diverticulosis with inflammation focally
along a large sigmoid diverticulum. No free perforation or abscess.
Appendectomy. No pericecal inflammation.

Vascular/Lymphatic: No acute vascular abnormality. No mass or
adenopathy.

Reproductive:No acute finding. Incidental coarse calcification in
the left ovary.

Other: No ascites or pneumoperitoneum.

Musculoskeletal: No acute abnormalities. Lower lumbar facet
arthropathy, advanced.
IMPRESSION: 1. Sigmoid diverticulitis without abscess or free perforation.
2. Hepatic steatosis.

## 2017-03-10 NOTE — Telephone Encounter (Signed)
Patient has a 10 week follow up visit scheduled for Monday May 19 2017 at 1:20pm. Patient understands she needs to keep this appointment for insurance compliance.

## 2017-03-17 ENCOUNTER — Ambulatory Visit: Payer: Medicare Other | Admitting: Internal Medicine

## 2017-03-17 ENCOUNTER — Encounter: Payer: Self-pay | Admitting: Internal Medicine

## 2017-03-17 ENCOUNTER — Encounter (INDEPENDENT_AMBULATORY_CARE_PROVIDER_SITE_OTHER): Payer: Self-pay

## 2017-03-17 VITALS — BP 130/78 | HR 73 | Ht 62.0 in | Wt 235.2 lb

## 2017-03-17 DIAGNOSIS — I10 Essential (primary) hypertension: Secondary | ICD-10-CM

## 2017-03-17 DIAGNOSIS — R0609 Other forms of dyspnea: Secondary | ICD-10-CM | POA: Diagnosis not present

## 2017-03-17 DIAGNOSIS — R0600 Dyspnea, unspecified: Secondary | ICD-10-CM

## 2017-03-17 DIAGNOSIS — G4733 Obstructive sleep apnea (adult) (pediatric): Secondary | ICD-10-CM | POA: Diagnosis not present

## 2017-03-17 NOTE — Patient Instructions (Signed)
Medication Instructions:  Your physician recommends that you continue on your current medications as directed. Please refer to the Current Medication list given to you today.   Labwork: None   Testing/Procedures: None   Follow-Up: Your physician wants you to follow-up in: 6 months with Dr End. (May 2019)  You will receive a reminder letter in the mail two months in advance. If you don't receive a letter, please call our office to schedule the follow-up appointment.   Any Other Special Instructions Will Be Listed Below (If Applicable). If your shortness of breath does not improve with weight loss, call the office and Dr End will discuss further testing to evaluate your shortness of breath.    If you need a refill on your cardiac medications before your next appointment, please call your pharmacy.

## 2017-03-17 NOTE — Progress Notes (Signed)
Follow-up Outpatient Visit Date: 03/17/2017  Primary Care Provider: Fanny Bien, MD [address] [place] [address]  Chief Complaint: Shortness of breath  HPI:  Michelle [name] is a 71 y.o. year-old female with history of hypertension, hyperlipidemia, irritable bowel syndrome, GERD, degenerative disc disease, obstructive sleep apnea, and depression, who presents for follow-up of shortness of breath.  I last saw her in August, at which time she reported feeling better with less anxiety after increasing Lexapro to 40 mg daily.  She continued to have significant exertional dyspnea with moderate to strenuous activity as well as a nocturnal cough associated with sputum production.  I asked Michelle [name] to try metoprolol to see if this helped improve her symptoms.  We discussed proceeding with cardiac catheterization but agreed to defer this until she had the opportunity to try CPAP for a few weeks.  Today, Michelle [name] reports that she notes considerable improvement since starting CPAP. She is sleeping better at night and also feels better rested in the morning. Her exertional shortness of breath is unchanged. She took metoprolol for 3 days and did notice any difference. She therefore stopped taking it. She denies chest pain, palpitations, lightheadedness, and edema. Since our last visit, she has also switched from Lexapro to Zoloft with improvement in her depression.  --------------------------------------------------------------------------------------------------  Cardiovascular History & Procedures: Cardiovascular Problems:  Dyspnea on exertion  Atypical chest pain  Palpitations  Risk Factors:  Hypertension, hyperlipidemia, obesity, sedentary lifestyle, and age greater than 1  Cath/PCI:  None  CV Surgery:  None  EP Procedures and Devices:  None  Non-Invasive Evaluation(s):  TTE (08/15/16): Normal LV size and function with LVEF of 55-60%. Wall motion is  normal with grade 1 diastolic dysfunction. Normal RV size and function. No significant valvular abnormalities.  Pharmacologic myocardial perfusion stress test (08/05/16): Moderate in size, mild in severity, mid anteroseptal, apical septal, mid anterior, and apical anterior defect that is not reversible most likely due to breast attenuation. There is also a small in size, moderate in severity mid and apical inferior defect that is fixed and likely represents diaphragmatic attenuation. A small in size, mild in severity, basal and mid inferolateral defect is reversible and may reflect ischemia versus variations in diaphragmatic and breast attenuation. Overall study is low risk. LVEF 55-65%.  Recent CV Pertinent Labs: Lab Results  Component Value Date   INR 0.91 07/24/2011   K 5.4 (H) 12/06/2016   BUN 32 (H) 12/06/2016   CREATININE 0.93 12/06/2016    Past medical and surgical history were reviewed and updated in EPIC.  Current Meds  Medication Sig  . benzonatate (TESSALON) 100 MG capsule Take 1-2 capsules (100-200 mg total) by mouth 3 (three) times daily as needed for cough.  . Cholecalciferol (VITAMIN D PO) Take 1 tablet by mouth daily.  Marland Kitchen losartan (COZAAR) 25 MG tablet Take 1 tablet (25 mg total) by mouth daily.  . sertraline (ZOLOFT) 100 MG tablet     Allergies: Patient has no known allergies.  Social History   Socioeconomic History  . Marital status: Divorced    Spouse name: Not on file  . Number of children: Not on file  . Years of education: Not on file  . Highest education level: Not on file  Social Needs  . Financial resource strain: Not on file  . Food insecurity - worry: Not on file  . Food insecurity - inability: Not on file  . Transportation needs - medical: Not on  file  . Transportation needs - non-medical: Not on file  Occupational History  . Not on file  Tobacco Use  . Smoking status: Former Smoker    Packs/day: 0.25    Years: 30.00    Pack years: 7.50     Types: Cigarettes    Last attempt to quit: 2016    Years since quitting: 2.9  . Smokeless tobacco: Never Used  Substance and Sexual Activity  . Alcohol use: Yes    Alcohol/week: 4.8 oz    Types: 8 Glasses of wine per week    Comment: socially  . Drug use: No  . Sexual activity: No  Other Topics Concern  . Not on file  Social History Narrative  . Not on file    Family History  Problem Relation Age of Onset  . Heart disease Mother        Rheumatic heart disease  . Pulmonary embolism Mother   . Heart disease Father        valvular heart disease  . Stroke Father   . Hypertension Sister   . Colon cancer Neg Hx   . Esophageal cancer Neg Hx   . Rectal cancer Neg Hx   . Stomach cancer Neg Hx   . Pancreatic cancer Neg Hx     Review of Systems: A 12-system review of systems was performed and was negative except as noted in the HPI.  --------------------------------------------------------------------------------------------------  Physical Exam: BP 130/78   Pulse 73   Ht 5\' 2"  (1.575 m)   Wt 235 lb 3.2 oz (106.7 kg)   SpO2 95%   BMI 43.02 kg/m   General:  Morbidly obese woman, seated comfortably in the exam room. HEENT: No conjunctival pallor or scleral icterus. Moist mucous membranes.  OP clear. Neck: Supple without lymphadenopathy, thyromegaly, JVD, or HJR, though evaluation is limited by body habitus. Lungs: Normal work of breathing. Clear to auscultation bilaterally without wheezes or crackles. Heart: Regular rate and rhythm without murmurs, rubs, or gallops. Unable to assess PMI due to body habitus. Abd: Bowel sounds present. Soft, NT/ND. Unable to assess HSM due to body habitus. Ext: No lower extremity edema. Radial, PT, and DP pulses are 2+ bilaterally. Skin: Warm and dry without rash.  Lab Results  Component Value Date   WBC 5.9 10/04/2016   HGB 13.5 10/04/2016   HCT 38.7 10/04/2016   MCV 98.5 (A) 10/04/2016   PLT 222 10/06/2015    Lab Results    Component Value Date   NA 140 12/06/2016   K 5.4 (H) 12/06/2016   CL 102 12/06/2016   CO2 22 12/06/2016   BUN 32 (H) 12/06/2016   CREATININE 0.93 12/06/2016   GLUCOSE 151 (H) 12/06/2016   ALT 47 10/06/2015    No results found for: CHOL, HDL, LDLCALC, LDLDIRECT, TRIG, CHOLHDL  --------------------------------------------------------------------------------------------------  ASSESSMENT AND PLAN: Chronic dyspnea on exertion This is most likely due to morbid obesity, sleep apnea, and mild diastolic dysfunction. Stress test in April was low risk, albeit with multiple fixed defects most suggestive of artifact.I have encouraged Michelle [name] to lose weight and increase her activity. Hopefully weight loss and continued CPAP use with improve her breathing. If not, we will consider coronary CTA versus left and right heart catheterization. Pulmonary referral would also be helpful if dyspnea persists.  Hypertension Blood pressure is upper normal today. We discussed lifestyle modifications and will not make any medication changes at this time.  Obstructive sleep apnea Continues CPAP use was encouraged. Ms.  [name] will follow-up with Dr. Radford Pax as previously arranged.  Morbid obesity We discussed importance of exercise and dietary modifications aimed at weight loss. We discussed the Mediterranean diet as a good option for her.  Follow-up: Return to clinic in 6 months, sooner if symptoms worsen.  Nelva Bush, MD 03/18/2017 7:13 PM

## 2017-03-18 ENCOUNTER — Encounter: Payer: Self-pay | Admitting: Internal Medicine

## 2017-03-18 DIAGNOSIS — R0609 Other forms of dyspnea: Secondary | ICD-10-CM | POA: Insufficient documentation

## 2017-03-18 DIAGNOSIS — R0600 Dyspnea, unspecified: Secondary | ICD-10-CM | POA: Insufficient documentation

## 2017-03-19 DIAGNOSIS — G4733 Obstructive sleep apnea (adult) (pediatric): Secondary | ICD-10-CM | POA: Diagnosis not present

## 2017-03-19 DIAGNOSIS — F329 Major depressive disorder, single episode, unspecified: Secondary | ICD-10-CM | POA: Diagnosis not present

## 2017-03-19 DIAGNOSIS — F411 Generalized anxiety disorder: Secondary | ICD-10-CM | POA: Diagnosis not present

## 2017-03-20 ENCOUNTER — Encounter: Payer: Self-pay | Admitting: Cardiology

## 2017-03-21 DIAGNOSIS — G4733 Obstructive sleep apnea (adult) (pediatric): Secondary | ICD-10-CM | POA: Diagnosis not present

## 2017-03-26 DIAGNOSIS — H5213 Myopia, bilateral: Secondary | ICD-10-CM | POA: Diagnosis not present

## 2017-04-03 ENCOUNTER — Telehealth: Payer: Self-pay | Admitting: *Deleted

## 2017-04-03 NOTE — Telephone Encounter (Signed)
Informed patient of compliance  results and verbalized understanding was indicated. Patient understands his apnea events are in normal range at 6.1. Patient understands his compliance is good. Patient understands his current settings will not change. Patient thanked me for the call.

## 2017-04-03 NOTE — Telephone Encounter (Signed)
-----   Message from Sueanne Margarita, MD sent at 04/03/2017  2:54 PM EST ----- Good AHI and compliance.  Continue current CPAP settings.

## 2017-04-09 DIAGNOSIS — H02413 Mechanical ptosis of bilateral eyelids: Secondary | ICD-10-CM | POA: Diagnosis not present

## 2017-04-21 DIAGNOSIS — J069 Acute upper respiratory infection, unspecified: Secondary | ICD-10-CM | POA: Diagnosis not present

## 2017-04-21 DIAGNOSIS — Z6841 Body Mass Index (BMI) 40.0 and over, adult: Secondary | ICD-10-CM | POA: Diagnosis not present

## 2017-04-21 DIAGNOSIS — J209 Acute bronchitis, unspecified: Secondary | ICD-10-CM | POA: Diagnosis not present

## 2017-04-21 DIAGNOSIS — G4733 Obstructive sleep apnea (adult) (pediatric): Secondary | ICD-10-CM | POA: Diagnosis not present

## 2017-04-23 ENCOUNTER — Telehealth: Payer: Self-pay | Admitting: *Deleted

## 2017-04-23 ENCOUNTER — Encounter: Payer: Self-pay | Admitting: Cardiology

## 2017-04-23 DIAGNOSIS — G4734 Idiopathic sleep related nonobstructive alveolar hypoventilation: Secondary | ICD-10-CM

## 2017-04-23 NOTE — Telephone Encounter (Signed)
-----   Message from Sueanne Margarita, MD sent at 04/16/2017 10:45 AM EST ----- Patient continues to have nocturnal hypoxemia on PAP therapy.  Please start O2 at 2L via PAP nightly and repeat pulse ox overnight on PAP and O2

## 2017-04-23 NOTE — Telephone Encounter (Signed)
Patient understands she continues to have night-time oxygen drops (nocturnal hypoxemia) even on her CPAP. Patient agrees because she has been sick. Patient understands Dr Radford Pax has ordered her 02 at 2L on CPAP nightly and [facility] will repeat pulse ox overnight on CPAP and 02. Patient was grateful for the call and thanked me.

## 2017-04-23 NOTE — Telephone Encounter (Signed)
Orders sent to [facility] via community message.

## 2017-04-24 ENCOUNTER — Other Ambulatory Visit: Payer: Self-pay

## 2017-04-24 ENCOUNTER — Encounter (HOSPITAL_COMMUNITY): Payer: Self-pay | Admitting: *Deleted

## 2017-04-24 ENCOUNTER — Inpatient Hospital Stay (HOSPITAL_COMMUNITY)
Admission: EM | Admit: 2017-04-24 | Discharge: 2017-05-02 | DRG: 202 | Disposition: A | Discharge status: Home / Self Care | Payer: Medicare Other | Attending: Nephrology | Admitting: Nephrology

## 2017-04-24 ENCOUNTER — Emergency Department (HOSPITAL_COMMUNITY): Payer: Medicare Other

## 2017-04-24 DIAGNOSIS — Z79899 Other long term (current) drug therapy: Secondary | ICD-10-CM | POA: Diagnosis not present

## 2017-04-24 DIAGNOSIS — R0602 Shortness of breath: Secondary | ICD-10-CM | POA: Diagnosis not present

## 2017-04-24 DIAGNOSIS — J205 Acute bronchitis due to respiratory syncytial virus: Principal | ICD-10-CM | POA: Diagnosis present

## 2017-04-24 DIAGNOSIS — Z7951 Long term (current) use of inhaled steroids: Secondary | ICD-10-CM | POA: Diagnosis not present

## 2017-04-24 DIAGNOSIS — E785 Hyperlipidemia, unspecified: Secondary | ICD-10-CM | POA: Diagnosis present

## 2017-04-24 DIAGNOSIS — J440 Chronic obstructive pulmonary disease with acute lower respiratory infection: Secondary | ICD-10-CM | POA: Diagnosis present

## 2017-04-24 DIAGNOSIS — Z96652 Presence of left artificial knee joint: Secondary | ICD-10-CM | POA: Diagnosis present

## 2017-04-24 DIAGNOSIS — Z96651 Presence of right artificial knee joint: Secondary | ICD-10-CM | POA: Diagnosis present

## 2017-04-24 DIAGNOSIS — R062 Wheezing: Secondary | ICD-10-CM | POA: Diagnosis not present

## 2017-04-24 DIAGNOSIS — E872 Acidosis: Secondary | ICD-10-CM | POA: Diagnosis present

## 2017-04-24 DIAGNOSIS — I2510 Atherosclerotic heart disease of native coronary artery without angina pectoris: Secondary | ICD-10-CM | POA: Diagnosis present

## 2017-04-24 DIAGNOSIS — Z9842 Cataract extraction status, left eye: Secondary | ICD-10-CM

## 2017-04-24 DIAGNOSIS — E871 Hypo-osmolality and hyponatremia: Secondary | ICD-10-CM | POA: Diagnosis present

## 2017-04-24 DIAGNOSIS — J9601 Acute respiratory failure with hypoxia: Secondary | ICD-10-CM

## 2017-04-24 DIAGNOSIS — J441 Chronic obstructive pulmonary disease with (acute) exacerbation: Secondary | ICD-10-CM | POA: Diagnosis present

## 2017-04-24 DIAGNOSIS — M1990 Unspecified osteoarthritis, unspecified site: Secondary | ICD-10-CM | POA: Diagnosis present

## 2017-04-24 DIAGNOSIS — R0902 Hypoxemia: Secondary | ICD-10-CM | POA: Diagnosis not present

## 2017-04-24 DIAGNOSIS — Z6841 Body Mass Index (BMI) 40.0 and over, adult: Secondary | ICD-10-CM | POA: Diagnosis not present

## 2017-04-24 DIAGNOSIS — G4733 Obstructive sleep apnea (adult) (pediatric): Secondary | ICD-10-CM | POA: Diagnosis present

## 2017-04-24 DIAGNOSIS — F1010 Alcohol abuse, uncomplicated: Secondary | ICD-10-CM | POA: Diagnosis present

## 2017-04-24 DIAGNOSIS — Z9841 Cataract extraction status, right eye: Secondary | ICD-10-CM

## 2017-04-24 DIAGNOSIS — R000 Tachycardia, unspecified: Secondary | ICD-10-CM | POA: Diagnosis not present

## 2017-04-24 DIAGNOSIS — Z961 Presence of intraocular lens: Secondary | ICD-10-CM | POA: Diagnosis present

## 2017-04-24 DIAGNOSIS — R7989 Other specified abnormal findings of blood chemistry: Secondary | ICD-10-CM | POA: Diagnosis not present

## 2017-04-24 DIAGNOSIS — R944 Abnormal results of kidney function studies: Secondary | ICD-10-CM | POA: Diagnosis present

## 2017-04-24 DIAGNOSIS — J069 Acute upper respiratory infection, unspecified: Secondary | ICD-10-CM

## 2017-04-24 DIAGNOSIS — J209 Acute bronchitis, unspecified: Secondary | ICD-10-CM

## 2017-04-24 DIAGNOSIS — R945 Abnormal results of liver function studies: Secondary | ICD-10-CM | POA: Diagnosis not present

## 2017-04-24 DIAGNOSIS — R05 Cough: Secondary | ICD-10-CM | POA: Diagnosis not present

## 2017-04-24 DIAGNOSIS — R0609 Other forms of dyspnea: Secondary | ICD-10-CM | POA: Diagnosis not present

## 2017-04-24 DIAGNOSIS — D696 Thrombocytopenia, unspecified: Secondary | ICD-10-CM | POA: Diagnosis present

## 2017-04-24 DIAGNOSIS — J189 Pneumonia, unspecified organism: Secondary | ICD-10-CM | POA: Diagnosis not present

## 2017-04-24 DIAGNOSIS — R059 Cough, unspecified: Secondary | ICD-10-CM

## 2017-04-24 DIAGNOSIS — K760 Fatty (change of) liver, not elsewhere classified: Secondary | ICD-10-CM | POA: Diagnosis present

## 2017-04-24 DIAGNOSIS — I10 Essential (primary) hypertension: Secondary | ICD-10-CM | POA: Diagnosis not present

## 2017-04-24 HISTORY — DX: Unspecified chronic bronchitis: J42

## 2017-04-24 HISTORY — DX: Dependence on other enabling machines and devices: Z99.89

## 2017-04-24 HISTORY — DX: Low back pain, unspecified: M54.50

## 2017-04-24 HISTORY — DX: Other visual disturbances: H53.8

## 2017-04-24 HISTORY — DX: Acute respiratory failure with hypoxia: J96.01

## 2017-04-24 HISTORY — DX: Other chronic pain: G89.29

## 2017-04-24 HISTORY — DX: Unspecified osteoarthritis, unspecified site: M19.90

## 2017-04-24 HISTORY — DX: Obstructive sleep apnea (adult) (pediatric): G47.33

## 2017-04-24 HISTORY — DX: Pneumonia, unspecified organism: J18.9

## 2017-04-24 HISTORY — DX: Low back pain: M54.5

## 2017-04-24 LAB — CBC
HCT: 39.4 % (ref 36.0–46.0)
Hemoglobin: 12.8 g/dL (ref 12.0–15.0)
MCH: 32.8 pg (ref 26.0–34.0)
MCHC: 32.5 g/dL (ref 30.0–36.0)
MCV: 101 fL — ABNORMAL HIGH (ref 78.0–100.0)
Platelets: 134 10*3/uL — ABNORMAL LOW (ref 150–400)
RBC: 3.9 MIL/uL (ref 3.87–5.11)
RDW: 12.6 % (ref 11.5–15.5)
WBC: 5 10*3/uL (ref 4.0–10.5)

## 2017-04-24 LAB — CBC WITH DIFFERENTIAL/PLATELET
BASOS ABS: 0 10*3/uL (ref 0.0–0.1)
Basophils Relative: 0 %
EOS PCT: 0 %
Eosinophils Absolute: 0 10*3/uL (ref 0.0–0.7)
HCT: 41.3 % (ref 36.0–46.0)
Hemoglobin: 14 g/dL (ref 12.0–15.0)
LYMPHS PCT: 12 %
Lymphs Abs: 0.8 10*3/uL (ref 0.7–4.0)
MCH: 34 pg (ref 26.0–34.0)
MCHC: 33.9 g/dL (ref 30.0–36.0)
MCV: 100.2 fL — AB (ref 78.0–100.0)
MONO ABS: 0.5 10*3/uL (ref 0.1–1.0)
Monocytes Relative: 7 %
Neutro Abs: 5.4 10*3/uL (ref 1.7–7.7)
Neutrophils Relative %: 81 %
PLATELETS: 152 10*3/uL (ref 150–400)
RBC: 4.12 MIL/uL (ref 3.87–5.11)
RDW: 12.6 % (ref 11.5–15.5)
WBC: 6.7 10*3/uL (ref 4.0–10.5)

## 2017-04-24 LAB — COMPREHENSIVE METABOLIC PANEL
ALK PHOS: 73 U/L (ref 38–126)
ALT: 103 U/L — AB (ref 14–54)
AST: 61 U/L — AB (ref 15–41)
Albumin: 4 g/dL (ref 3.5–5.0)
Anion gap: 13 (ref 5–15)
BILIRUBIN TOTAL: 1 mg/dL (ref 0.3–1.2)
BUN: 24 mg/dL — AB (ref 6–20)
CO2: 19 mmol/L — ABNORMAL LOW (ref 22–32)
CREATININE: 1.18 mg/dL — AB (ref 0.44–1.00)
Calcium: 9.1 mg/dL (ref 8.9–10.3)
Chloride: 104 mmol/L (ref 101–111)
GFR calc Af Amer: 53 mL/min — ABNORMAL LOW (ref >60)
GFR calc non Af Amer: 46 mL/min — ABNORMAL LOW (ref >60)
GLUCOSE: 152 mg/dL — AB (ref 65–99)
Potassium: 3.9 mmol/L (ref 3.5–5.1)
Sodium: 136 mmol/L (ref 135–145)
TOTAL PROTEIN: 7.2 g/dL (ref 6.5–8.1)

## 2017-04-24 LAB — I-STAT CG4 LACTIC ACID, ED: Lactic Acid, Venous: 3.53 mmol/L (ref 0.5–1.9)

## 2017-04-24 LAB — CREATININE, SERUM
CREATININE: 1.14 mg/dL — AB (ref 0.44–1.00)
GFR calc Af Amer: 56 mL/min — ABNORMAL LOW (ref >60)
GFR, EST NON AFRICAN AMERICAN: 48 mL/min — AB (ref >60)

## 2017-04-24 LAB — INFLUENZA PANEL BY PCR (TYPE A & B)
INFLAPCR: NEGATIVE
Influenza B By PCR: NEGATIVE

## 2017-04-24 MED ORDER — IPRATROPIUM-ALBUTEROL 0.5-2.5 (3) MG/3ML IN SOLN
3.0000 mL | Freq: Once | RESPIRATORY_TRACT | Status: AC
  Administered 2017-04-24: 3 mL via RESPIRATORY_TRACT
  Filled 2017-04-24: qty 3

## 2017-04-24 MED ORDER — ACETAMINOPHEN 650 MG RE SUPP: 650.0000 mg | Freq: Four times a day (QID) | RECTAL | Status: DC | PRN

## 2017-04-24 MED ORDER — METHYLPREDNISOLONE SODIUM SUCC 125 MG IJ SOLR
60.0000 mg | Freq: Two times a day (BID) | INTRAMUSCULAR | Status: DC
  Administered 2017-04-25 – 2017-04-27 (×5): 60 mg via INTRAVENOUS
  Filled 2017-04-24 (×5): qty 2

## 2017-04-24 MED ORDER — BENZONATATE 100 MG PO CAPS
100.0000 mg | ORAL_CAPSULE | Freq: Three times a day (TID) | ORAL | Status: DC | PRN
  Administered 2017-04-24 – 2017-05-02 (×8): 100 mg via ORAL
  Filled 2017-04-24 (×8): qty 1

## 2017-04-24 MED ORDER — METHYLPREDNISOLONE SODIUM SUCC 125 MG IJ SOLR
125.0000 mg | Freq: Once | INTRAMUSCULAR | Status: AC
  Administered 2017-04-24: 125 mg via INTRAVENOUS
  Filled 2017-04-24: qty 2

## 2017-04-24 MED ORDER — IPRATROPIUM-ALBUTEROL 0.5-2.5 (3) MG/3ML IN SOLN
3.0000 mL | Freq: Two times a day (BID) | RESPIRATORY_TRACT | Status: DC
  Administered 2017-04-25 – 2017-04-26 (×3): 3 mL via RESPIRATORY_TRACT
  Filled 2017-04-24 (×3): qty 3

## 2017-04-24 MED ORDER — IPRATROPIUM-ALBUTEROL 0.5-2.5 (3) MG/3ML IN SOLN
3.0000 mL | Freq: Four times a day (QID) | RESPIRATORY_TRACT | Status: DC
  Administered 2017-04-24: 3 mL via RESPIRATORY_TRACT
  Filled 2017-04-24: qty 3

## 2017-04-24 MED ORDER — DEXTROSE 5 % IV SOLN
500.0000 mg | Freq: Once | INTRAVENOUS | Status: AC
  Administered 2017-04-24: 500 mg via INTRAVENOUS
  Filled 2017-04-24: qty 500

## 2017-04-24 MED ORDER — GUAIFENESIN 100 MG/5ML PO SOLN
5.0000 mL | ORAL | Status: DC | PRN
  Administered 2017-04-24 – 2017-04-26 (×5): 100 mg via ORAL
  Filled 2017-04-24 (×5): qty 5

## 2017-04-24 MED ORDER — IPRATROPIUM-ALBUTEROL 0.5-2.5 (3) MG/3ML IN SOLN
3.0000 mL | RESPIRATORY_TRACT | Status: DC | PRN
  Administered 2017-04-25: 3 mL via RESPIRATORY_TRACT
  Filled 2017-04-24 (×2): qty 3

## 2017-04-24 MED ORDER — SODIUM CHLORIDE 0.9 % IV BOLUS (SEPSIS)
1000.0000 mL | Freq: Once | INTRAVENOUS | Status: AC
  Administered 2017-04-24: 1000 mL via INTRAVENOUS

## 2017-04-24 MED ORDER — AZITHROMYCIN 500 MG IV SOLR
250.0000 mg | INTRAVENOUS | Status: DC
  Administered 2017-04-25 – 2017-04-26 (×2): 250 mg via INTRAVENOUS
  Filled 2017-04-24 (×2): qty 250

## 2017-04-24 MED ORDER — ENOXAPARIN SODIUM 40 MG/0.4ML ~~LOC~~ SOLN
40.0000 mg | SUBCUTANEOUS | Status: DC
  Administered 2017-04-24: 40 mg via SUBCUTANEOUS
  Filled 2017-04-24 (×6): qty 0.4

## 2017-04-24 MED ORDER — ACETAMINOPHEN 325 MG PO TABS
650.0000 mg | ORAL_TABLET | Freq: Four times a day (QID) | ORAL | Status: DC | PRN
  Administered 2017-04-25 – 2017-04-30 (×7): 650 mg via ORAL
  Filled 2017-04-24 (×7): qty 2

## 2017-04-24 NOTE — ED Notes (Signed)
CRITICAL LAB:  ** results given to Johnney Killian, MD + Santiago Glad, RN @ [phone]  Lactic Acid: 3.53

## 2017-04-24 NOTE — ED Notes (Signed)
Patient transported to X-ray 

## 2017-04-24 NOTE — ED Notes (Signed)
Ordered meal tray for pt 

## 2017-04-24 NOTE — ED Notes (Signed)
Lacinda Axon, MD advises to DC 2nd Lactic

## 2017-04-24 NOTE — ED Provider Notes (Signed)
[facility] EMERGENCY DEPARTMENT Provider Note   CSN: [id number] Arrival date & time: 04/24/17  1047     History   Chief Complaint Chief Complaint  Patient presents with  . Shortness of Breath    HPI Michelle [name] is a 72 y.o. female.  Level 5 caveat for urgent need for intervention.  Patient presents with cough, wheezing, dyspnea for several days.  She was seen by her primary care doctor on 04/21/17 and prescribed a steroid injection.  Symptoms have worsened.  She is now dyspneic with exertion.  She was seen by her primary care doctor this morning and given IM Rocephin.  She was instructed to go to the emergency department for a probable admission.  Severity of symptoms is moderate to severe.  Exertion makes symptoms worse.      Past Medical History:  Diagnosis Date  . Cataract   . Chondromalacia of knee    right knee  . DDD (degenerative disc disease) 07-24-11   Osteoarthritis-s/p LTKA 2'11, now right planned  . Depression 07-24-11   tx. depression only  . Hx of adenomatous polyp of colon 03/21/2014  . Hyperlipidemia   . Hypertension   . IBS (irritable bowel syndrome)   . Obstructive sleep apnea 12/07/2016    Patient Active Problem List   Diagnosis Date Noted  . Dyspnea on exertion 03/18/2017  . Morbid obesity ([place]) 03/18/2017  . Obstructive sleep apnea 12/07/2016  . Essential hypertension 12/07/2016  . Hx of adenomatous polyp of colon 03/21/2014  . Postop Hyponatremia 08/07/2011  . OA (osteoarthritis) of knee 08/05/2011  . Pain in limb 05/15/2011    Past Surgical History:  Procedure Laterality Date  . APPENDECTOMY    . CATARACT EXTRACTION  07-24-11   bilateral with lens implant  . CESAREAN SECTION     X 2  . COLONOSCOPY    . COSMETIC SURGERY    . JOINT REPLACEMENT  2011   Left total knee replacement  . OOPHORECTOMY  07-24-11   '94-one ovary removed  . TOTAL KNEE ARTHROPLASTY  08/05/2011   Procedure: TOTAL KNEE ARTHROPLASTY;  Surgeon:  Gearlean Alf, MD;  Location: [facility] ORS;  Service: Orthopedics;  Laterality: Right;  . TUBAL LIGATION      OB History    No data available       Home Medications    Prior to Admission medications   Medication Sig Start Date End Date Taking? Authorizing Provider  albuterol (PROVENTIL HFA;VENTOLIN HFA) 108 (90 Base) MCG/ACT inhaler Inhale 2 puffs into the lungs every 4 (four) hours as needed for wheezing or shortness of breath.   Yes [provider]  benzonatate (TESSALON) 100 MG capsule Take 1-2 capsules (100-200 mg total) by mouth 3 (three) times daily as needed for cough. 10/04/16  Yes Tenna Delaine D, PA-C  Cholecalciferol (VITAMIN D PO) Take 1 tablet by mouth daily.   Yes [provider]  fluticasone (FLONASE) 50 MCG/ACT nasal spray Place 2 sprays into both nostrils daily as needed for allergies or rhinitis.   Yes [provider]  ibuprofen (ADVIL,MOTRIN) 200 MG tablet Take 600 mg by mouth every 6 (six) hours as needed for moderate pain.   Yes [provider]  losartan (COZAAR) 50 MG tablet Take 50 mg by mouth daily.   Yes [provider]  sertraline (ZOLOFT) 100 MG tablet Take 200 mg by mouth daily.  03/07/17  Yes [provider]  losartan (COZAAR) 25 MG tablet Take 1  tablet (25 mg total) by mouth daily. Patient not taking: Reported on 04/24/2017 12/06/16 04/24/17  End, Harrell Gave, MD    Family History Family History  Problem Relation Age of Onset  . Heart disease Mother        Rheumatic heart disease  . Pulmonary embolism Mother   . Heart disease Father        valvular heart disease  . Stroke Father   . Hypertension Sister   . Colon cancer Neg Hx   . Esophageal cancer Neg Hx   . Rectal cancer Neg Hx   . Stomach cancer Neg Hx   . Pancreatic cancer Neg Hx     Social History Social History   Tobacco Use  . Smoking status: Former Smoker    Packs/day: 0.25    Years: 30.00    Pack years: 7.50    Types: Cigarettes     Last attempt to quit: 2016    Years since quitting: 3.0  . Smokeless tobacco: Never Used  Substance Use Topics  . Alcohol use: Yes    Alcohol/week: 4.8 oz    Types: 8 Glasses of wine per week    Comment: socially  . Drug use: No     Allergies   Patient has no known allergies.   Review of Systems Review of Systems  All other systems reviewed and are negative.    Physical Exam Updated Vital Signs BP 130/78   Pulse (!) 103   Temp 100 F (37.8 C) (Oral)   Resp (!) 21   SpO2 96%   Physical Exam  Constitutional: She is oriented to person, place, and time.  Tachypneic, dyspneic  HENT:  Head: Normocephalic and atraumatic.  Eyes: Conjunctivae are normal.  Neck: Neck supple.  Cardiovascular: Normal rate and regular rhythm.  Pulmonary/Chest:  Bilateral expiratory wheezes.  Abdominal: Soft. Bowel sounds are normal.  Musculoskeletal: Normal range of motion.  Neurological: She is alert and oriented to person, place, and time.  Skin: Skin is warm and dry.  Psychiatric: She has a normal mood and affect. Her behavior is normal.  Nursing note and vitals reviewed.    ED Treatments / Results  Labs (all labs ordered are listed, but only abnormal results are displayed) Labs Reviewed  COMPREHENSIVE METABOLIC PANEL - Abnormal; Notable for the following components:      Result Value   CO2 19 (*)    Glucose, Bld 152 (*)    BUN 24 (*)    Creatinine, Ser 1.18 (*)    AST 61 (*)    ALT 103 (*)    GFR calc non Af Amer 46 (*)    GFR calc Af Amer 53 (*)    All other components within normal limits  CBC WITH DIFFERENTIAL/PLATELET - Abnormal; Notable for the following components:   MCV 100.2 (*)    All other components within normal limits  I-STAT CG4 LACTIC ACID, ED - Abnormal; Notable for the following components:   Lactic Acid, Venous 3.53 (*)    All other components within normal limits  CULTURE, BLOOD (ROUTINE X 2)  CULTURE, BLOOD (ROUTINE X 2)    EKG  EKG  Interpretation  Date/Time:  Thursday April 24 2017 10:53:57 EST Ventricular Rate:  105 PR Interval:  152 QRS Duration: 70 QT Interval:  326 QTC Calculation: 430 R Axis:   -10 Text Interpretation:  Sinus tachycardia Right atrial enlargement Septal infarct , age undetermined Abnormal ECG Confirmed by Nat Christen [phone]) on 04/24/2017 12:16:40 PM  Radiology Dg Chest 2 View  Result Date: 04/24/2017 CLINICAL DATA:  Shortness of breath.  Cough and congestion. EXAM: CHEST  2 VIEW COMPARISON:  05/02/2014 FINDINGS: Mediastinum hilar structures normal. Volumes with mild basilar atelectasis. No pleural effusion or pneumothorax. Heart size normal. No acute bony abnormality. IMPRESSION: Low lung volumes mild basilar atelectasis. No acute pulmonary infiltrate. Electronically Signed   By: Marcello Moores  Register   On: 04/24/2017 12:19    Procedures Procedures (including critical care time)  Medications Ordered in ED Medications  ipratropium-albuterol (DUONEB) 0.5-2.5 (3) MG/3ML nebulizer solution 3 mL (not administered)  ipratropium-albuterol (DUONEB) 0.5-2.5 (3) MG/3ML nebulizer solution 3 mL (3 mLs Nebulization Given 04/24/17 1217)  methylPREDNISolone sodium succinate (SOLU-MEDROL) 125 mg/2 mL injection 125 mg (125 mg Intravenous Given 04/24/17 1238)  azithromycin (ZITHROMAX) 500 mg in dextrose 5 % 250 mL IVPB (500 mg Intravenous New Bag/Given 04/24/17 1240)  sodium chloride 0.9 % bolus 1,000 mL (1,000 mLs Intravenous New Bag/Given 04/24/17 1228)     Initial Impression / Assessment and Plan / ED Course  I have reviewed the triage vital signs and the nursing notes.  Pertinent labs & imaging results that were available during my care of the patient were reviewed by me and considered in my medical decision making (see chart for details).    Patient presents with coughing, wheezing, dyspnea.  History and physical consistent with a upper respiratory infection.  She has had minimal improvement with  nebulizer treatments, IV steroids.  IV Zithromax and IVF ordered.  Admit to general medicine.   Final Clinical Impressions(s) / ED Diagnoses   Final diagnoses:  Upper respiratory tract infection, unspecified type    ED Discharge Orders    None       Nat Christen, MD 04/24/17 1413

## 2017-04-24 NOTE — H&P (Addendum)
History and Physical    Michelle [name] LOV:[id number] DOB: Sep 02, 1945 DOA: 04/24/2017  PCP: Fanny Bien, MD  Patient coming from: Home  Chief Complaint: SOB, wheezing  HPI: Michelle [name] is a 72 y.o. female with medical history significant of arthritis, HTN, HLD presented initially to outpatient primary care provider with complaints of coughing, wheezing, shortness of breath times several days prior to hospital admission.  Patient was given a steroid injection in the office.  Does continue to worsen.  Patient represented to primary care provider who gave patient 1 dose of IM Rocephin and was referred patient to the emergency department for further workup.  ED Course: In the emergency department, patient was noted to have presenting O2 sats in the 80s with respiratory rate in excess of 26 breaths/min.  Hypoxia did improve with supplemental oxygen.  Patient was given DuoNeb as well as IV Solu-Medrol with some improvement, although patient remained dyspneic.  Flu swab has since been ordered, pending at the time of this dictation.  Given concerns for acute respiratory failure, hospitalist service was consulted for consideration for medical admission.  Review of Systems:  Review of Systems  Constitutional: Negative for chills, diaphoresis and fever.  HENT: Negative for congestion, ear discharge, ear pain and nosebleeds.   Eyes: Negative for double vision, photophobia and pain.  Respiratory: Positive for cough, shortness of breath and wheezing.   Cardiovascular: Negative for palpitations, orthopnea and claudication.  Gastrointestinal: Negative for abdominal pain, diarrhea, nausea and vomiting.  Genitourinary: Negative for frequency, hematuria and urgency.  Musculoskeletal: Negative for back pain, falls and neck pain.  Neurological: Negative for tingling, tremors, seizures, loss of consciousness and headaches.  Psychiatric/Behavioral: Negative for hallucinations, memory loss and substance  abuse.    Past Medical History:  Diagnosis Date  . Cataract   . Chondromalacia of knee    right knee  . DDD (degenerative disc disease) 07-24-11   Osteoarthritis-s/p LTKA 2'11, now right planned  . Depression 07-24-11   tx. depression only  . Hx of adenomatous polyp of colon 03/21/2014  . Hyperlipidemia   . Hypertension   . IBS (irritable bowel syndrome)   . Obstructive sleep apnea 12/07/2016    Past Surgical History:  Procedure Laterality Date  . APPENDECTOMY    . CATARACT EXTRACTION  07-24-11   bilateral with lens implant  . CESAREAN SECTION     X 2  . COLONOSCOPY    . COSMETIC SURGERY    . JOINT REPLACEMENT  2011   Left total knee replacement  . OOPHORECTOMY  07-24-11   '94-one ovary removed  . TOTAL KNEE ARTHROPLASTY  08/05/2011   Procedure: TOTAL KNEE ARTHROPLASTY;  Surgeon: Gearlean Alf, MD;  Location: [facility];  Service: Orthopedics;  Laterality: Right;  . TUBAL LIGATION       reports that she quit smoking about 3 years ago. Her smoking use included cigarettes. She has a 7.50 pack-year smoking history. she has never used smokeless tobacco. She reports that she drinks about 4.8 oz of alcohol per week. She reports that she does not use drugs.  No Known Allergies  Family History  Problem Relation Age of Onset  . Heart disease Mother        Rheumatic heart disease  . Pulmonary embolism Mother   . Heart disease Father        valvular heart disease  . Stroke Father   . Hypertension Sister   . Colon cancer Neg Hx   . Esophageal  cancer Neg Hx   . Rectal cancer Neg Hx   . Stomach cancer Neg Hx   . Pancreatic cancer Neg Hx     Prior to Admission medications   Medication Sig Start Date End Date Taking? Authorizing Provider  albuterol (PROVENTIL HFA;VENTOLIN HFA) 108 (90 Base) MCG/ACT inhaler Inhale 2 puffs into the lungs every 4 (four) hours as needed for wheezing or shortness of breath.   Yes [provider]  benzonatate (TESSALON) 100 MG capsule Take 1-2  capsules (100-200 mg total) by mouth 3 (three) times daily as needed for cough. 10/04/16  Yes Tenna Delaine D, PA-C  Cholecalciferol (VITAMIN D PO) Take 1 tablet by mouth daily.   Yes [provider]  fluticasone (FLONASE) 50 MCG/ACT nasal spray Place 2 sprays into both nostrils daily as needed for allergies or rhinitis.   Yes [provider]  ibuprofen (ADVIL,MOTRIN) 200 MG tablet Take 600 mg by mouth every 6 (six) hours as needed for moderate pain.   Yes [provider]  losartan (COZAAR) 50 MG tablet Take 50 mg by mouth daily.   Yes [provider]  sertraline (ZOLOFT) 100 MG tablet Take 200 mg by mouth daily.  03/07/17  Yes [provider]  losartan (COZAAR) 25 MG tablet Take 1 tablet (25 mg total) by mouth daily. Patient not taking: Reported on 04/24/2017 12/06/16 04/24/17  Nelva Bush, MD    Physical Exam: Vitals:   04/24/17 1145 04/24/17 1200 04/24/17 1215 04/24/17 1315  BP: (!) 149/91 135/76 (!) 149/91 130/78  Pulse: 100 95 93 (!) 103  Resp:  19 (!) 21 (!) 21  Temp:      TempSrc:      SpO2: 92% 95% 97% 96%    Constitutional: NAD, calm, comfortable Vitals:   04/24/17 1145 04/24/17 1200 04/24/17 1215 04/24/17 1315  BP: (!) 149/91 135/76 (!) 149/91 130/78  Pulse: 100 95 93 (!) 103  Resp:  19 (!) 21 (!) 21  Temp:      TempSrc:      SpO2: 92% 95% 97% 96%   Eyes: PERRL, lids and conjunctivae normal ENMT: Mucous membranes are moist. Posterior pharynx clear of any exudate or lesions.Normal dentition.  Neck: normal, supple, no masses, no thyromegaly Respiratory: decreased breath sounds throughout, end-expiratory wheezing auscultated Cardiovascular: Regular rate and rhythm, s1, s2 Abdomen: no tenderness, no masses palpated. No hepatosplenomegaly. Bowel sounds positive.  Musculoskeletal: no clubbing / cyanosis. No joint deformity upper and lower extremities. Good ROM, no contractures. Normal muscle tone.  Skin: no rashes, lesions,  ulcers. No induration Neurologic: CN 2-12 grossly intact. Sensation intact, DTR normal. Strength 5/5 in all 4.  Psychiatric: Normal judgment and insight. Alert and oriented x 3. Normal mood.    Labs on Admission: I have personally reviewed following labs and imaging studies  CBC: Recent Labs  Lab 04/24/17 1100  WBC 6.7  NEUTROABS 5.4  HGB 14.0  HCT 41.3  MCV 100.2*  PLT 413   Basic Metabolic Panel: Recent Labs  Lab 04/24/17 1100  NA 136  K 3.9  CL 104  CO2 19*  GLUCOSE 152*  BUN 24*  CREATININE 1.18*  CALCIUM 9.1   GFR: CrCl cannot be calculated (Unknown ideal weight.). Liver Function Tests: Recent Labs  Lab 04/24/17 1100  AST 61*  ALT 103*  ALKPHOS 73  BILITOT 1.0  PROT 7.2  ALBUMIN 4.0   No results for input(s): LIPASE, AMYLASE in the last 168 hours. No results for input(s): AMMONIA in  the last 168 hours. Coagulation Profile: No results for input(s): INR, PROTIME in the last 168 hours. Cardiac Enzymes: No results for input(s): CKTOTAL, CKMB, CKMBINDEX, TROPONINI in the last 168 hours. BNP (last 3 results) No results for input(s): PROBNP in the last 8760 hours. HbA1C: No results for input(s): HGBA1C in the last 72 hours. CBG: No results for input(s): GLUCAP in the last 168 hours. Lipid Profile: No results for input(s): CHOL, HDL, LDLCALC, TRIG, CHOLHDL, LDLDIRECT in the last 72 hours. Thyroid Function Tests: No results for input(s): TSH, T4TOTAL, FREET4, T3FREE, THYROIDAB in the last 72 hours. Anemia Panel: No results for input(s): VITAMINB12, FOLATE, FERRITIN, TIBC, IRON, RETICCTPCT in the last 72 hours. Urine analysis:    Component Value Date/Time   COLORURINE YELLOW 10/06/2015 1612   APPEARANCEUR CLOUDY (A) 10/06/2015 1612   LABSPEC 1.013 10/06/2015 1612   PHURINE 5.5 10/06/2015 1612   GLUCOSEU NEGATIVE 10/06/2015 1612   HGBUR NEGATIVE 10/06/2015 1612   BILIRUBINUR NEGATIVE 10/06/2015 1612   KETONESUR NEGATIVE 10/06/2015 1612   PROTEINUR  NEGATIVE 10/06/2015 1612   UROBILINOGEN 0.2 07/24/2011 0948   NITRITE NEGATIVE 10/06/2015 1612   LEUKOCYTESUR NEGATIVE 10/06/2015 1612   Sepsis Labs: !!!!!!!!!!!!!!!!!!!!!!!!!!!!!!!!!!!!!!!!!!!! @LABRCNTIP (procalcitonin:4,lacticidven:4) )No results found for this or any previous visit (from the past 240 hour(s)).   Radiological Exams on Admission: Dg Chest 2 View  Result Date: 04/24/2017 CLINICAL DATA:  Shortness of breath.  Cough and congestion. EXAM: CHEST  2 VIEW COMPARISON:  05/02/2014 FINDINGS: Mediastinum hilar structures normal. Volumes with mild basilar atelectasis. No pleural effusion or pneumothorax. Heart size normal. No acute bony abnormality. IMPRESSION: Low lung volumes mild basilar atelectasis. No acute pulmonary infiltrate. Electronically Signed   By: Marcello Moores  Register   On: 04/24/2017 12:19    EKG: Independently reviewed. Sinus tach, QTc 430  Assessment/Plan Principal Problem:   Acute hypoxemic respiratory failure (HCC) Active Problems:   Obstructive sleep apnea   Essential hypertension   Morbid obesity (HCC)   Bronchitis, acute   1. Acute hypoxemic respiratory failure 1. Chest x-ray reviewed, no evidence for underlying pneumonia seen 2. In presenting tray, suspect acute bronchitis per below 3. Continue patient on scheduled DuoNeb's with IV Solu-Medrol 4. Continue supplemental oxygen, wean as tolerated 2. Bronchitis, acute 1. Nasal flu swab ordered in the emergency department, pending at the time of this dictation 2. Continue scheduled neb treatment as well as IV steroids as per above 3. Morbid obesity 1. Are stable at this time 4. Obstructive sleep apnea 1. Presently stable 2. Continue CPAP at night 5. Hypertension 1. BP stable 2. Cont home meds as tolerated 6. Hyperlipidemia 1. Appears stable at this time 7. Elevated lactate 1. Likely secondary to hopxia, NOT sepsis  DVT prophylaxis: Heparin subQ  Code Status: Full Family Communication: Pt in room    Disposition Plan: Uncertain at this time  Consults called:  Admission status: Inpatient, as patient will require IV steroids for treatment of acute hypoxemic respiratory failure and acute bronchitis  Marylu Lund MD [facility] Pager [phone]  If 7PM-7AM, please contact night-coverage [email or web address] Password [email or web address]  04/24/2017, 2:36 PM

## 2017-04-24 NOTE — Telephone Encounter (Addendum)
Per [facility] patient only de-sated below 88% for 3 minutes and the requirement is below  88% 5 minutes or longer. Patient can retest on CPAP and RA to re-qualify for 02 if Dr Radford Pax send the order. No office visit needed.

## 2017-04-24 NOTE — ED Notes (Signed)
Attempted report 

## 2017-04-24 NOTE — ED Triage Notes (Signed)
Pt reports that she has had cough, sob for several days. Pt was seen by her PCP and given steroids but no antibiotic. Pt reports no improvement was seen again today and sent here for increased respiratory rate and low spo2.

## 2017-04-25 DIAGNOSIS — J069 Acute upper respiratory infection, unspecified: Secondary | ICD-10-CM

## 2017-04-25 DIAGNOSIS — J205 Acute bronchitis due to respiratory syncytial virus: Principal | ICD-10-CM

## 2017-04-25 LAB — RESPIRATORY PANEL BY PCR
ADENOVIRUS-RVPPCR: NOT DETECTED
Bordetella pertussis: NOT DETECTED
CORONAVIRUS 229E-RVPPCR: NOT DETECTED
CORONAVIRUS NL63-RVPPCR: NOT DETECTED
CORONAVIRUS OC43-RVPPCR: NOT DETECTED
Chlamydophila pneumoniae: NOT DETECTED
Coronavirus HKU1: NOT DETECTED
INFLUENZA B-RVPPCR: NOT DETECTED
Influenza A: NOT DETECTED
METAPNEUMOVIRUS-RVPPCR: NOT DETECTED
Mycoplasma pneumoniae: NOT DETECTED
PARAINFLUENZA VIRUS 2-RVPPCR: NOT DETECTED
Parainfluenza Virus 1: NOT DETECTED
Parainfluenza Virus 3: NOT DETECTED
Parainfluenza Virus 4: NOT DETECTED
RESPIRATORY SYNCYTIAL VIRUS-RVPPCR: DETECTED — AB
Rhinovirus / Enterovirus: NOT DETECTED

## 2017-04-25 LAB — LACTIC ACID, PLASMA: Lactic Acid, Venous: 1.7 mmol/L (ref 0.5–1.9)

## 2017-04-25 LAB — CBC
HEMATOCRIT: 39.4 % (ref 36.0–46.0)
Hemoglobin: 13 g/dL (ref 12.0–15.0)
MCH: 33.2 pg (ref 26.0–34.0)
MCHC: 33 g/dL (ref 30.0–36.0)
MCV: 100.5 fL — AB (ref 78.0–100.0)
Platelets: 147 10*3/uL — ABNORMAL LOW (ref 150–400)
RBC: 3.92 MIL/uL (ref 3.87–5.11)
RDW: 12.6 % (ref 11.5–15.5)
WBC: 6.1 10*3/uL (ref 4.0–10.5)

## 2017-04-25 LAB — COMPREHENSIVE METABOLIC PANEL
ALBUMIN: 3.6 g/dL (ref 3.5–5.0)
ALK PHOS: 69 U/L (ref 38–126)
ALT: 94 U/L — ABNORMAL HIGH (ref 14–54)
ANION GAP: 11 (ref 5–15)
AST: 56 U/L — ABNORMAL HIGH (ref 15–41)
BILIRUBIN TOTAL: 0.7 mg/dL (ref 0.3–1.2)
BUN: 21 mg/dL — AB (ref 6–20)
CO2: 22 mmol/L (ref 22–32)
Calcium: 8.8 mg/dL — ABNORMAL LOW (ref 8.9–10.3)
Chloride: 105 mmol/L (ref 101–111)
Creatinine, Ser: 0.94 mg/dL (ref 0.44–1.00)
GFR calc Af Amer: >60 mL/min (ref >60)
GLUCOSE: 144 mg/dL — AB (ref 65–99)
POTASSIUM: 4.2 mmol/L (ref 3.5–5.1)
Sodium: 138 mmol/L (ref 135–145)
Total Protein: 6.5 g/dL (ref 6.5–8.1)

## 2017-04-25 MED ORDER — DIPHENHYDRAMINE HCL 25 MG PO CAPS
25.0000 mg | ORAL_CAPSULE | Freq: Every evening | ORAL | Status: DC | PRN
  Administered 2017-04-25 – 2017-05-01 (×6): 25 mg via ORAL
  Filled 2017-04-25 (×6): qty 1

## 2017-04-25 MED ORDER — ALBUTEROL SULFATE HFA 108 (90 BASE) MCG/ACT IN AERS: 2.0000 | INHALATION_SPRAY | RESPIRATORY_TRACT | Status: DC | PRN

## 2017-04-25 NOTE — Progress Notes (Signed)
[facility] PROGRESS NOTE    Progress Note  Michelle [name]  MGQ:[id number] DOB: 1945-07-17 DOA: 04/24/2017 PCP: Fanny Bien, MD     Brief Narrative:   Michelle [name] is an 72 y.o. female past medical history significant for arthritis presents with complaints of because of wheezing and shortness of breath several days prior to admission chest x-ray on admission showed no evidence of pneumonia likely acute bronchitis.  Assessment/Plan:   Acute hypoxemic respiratory failure due to  Bronchitis, acute: This x-ray shows no infiltrates, hungry with DuoNeb's IV Solu-Medrol and oxygen. We will continue IV antibiotics. Influenza PCR is negative. RSV was positive in respiratory virus panel   Obstructive sleep apnea c-pap at night   Essential hypertension Continue current home regimen blood pressure stable.  Morbid obesity ([place]) Counseling.  Elevated lactic acid: Likely due to hypoxia unlikely sepsis.   DVT prophylaxis: lovenox Family Communication:none Disposition Plan/Barrier to D/C: home in 2 days Code Status:     Code Status Orders  (From admission, onward)        Start     Ordered   04/24/17 1503  Full code  Continuous     04/24/17 1503    Code Status History    Date Active Date Inactive Code Status Order ID Comments User Context   08/05/2011 14:12 08/08/2011 15:10 Full Code [id number]  Sande Rives, RN Inpatient    Advance Directive Documentation     Most Recent Value  Type of Advance Directive  Living will, Healthcare Power of Attorney  Pre-existing out of facility DNR order (yellow form or pink MOST form)  No data  "MOST" Form in Place?  No data        IV Access:    Peripheral IV   Procedures and diagnostic studies:   Dg Chest 2 View  Result Date: 04/24/2017 CLINICAL DATA:  Shortness of breath.  Cough and congestion. EXAM: CHEST  2 VIEW COMPARISON:  05/02/2014 FINDINGS: Mediastinum hilar structures normal. Volumes with mild basilar  atelectasis. No pleural effusion or pneumothorax. Heart size normal. No acute bony abnormality. IMPRESSION: Low lung volumes mild basilar atelectasis. No acute pulmonary infiltrate. Electronically Signed   By: Marcello Moores  Register   On: 04/24/2017 12:19     Medical Consultants:    None.  Anti-Infectives:   IV azzithro  Subjective:    Michelle [name] she relates her shortness of breath is unchanged  Objective:    Vitals:   04/25/17 0000 04/25/17 0440 04/25/17 0757 04/25/17 0813  BP:  (!) 153/93    Pulse: 91 89    Resp:      Temp:  97.9 F (36.6 C)  97.6 F (36.4 C)  TempSrc:  Oral  Oral  SpO2: 92% 93% 94% 94%  Weight:        Intake/Output Summary (Last 24 hours) at 04/25/2017 0924 Last data filed at 04/24/2017 2100 Gross per 24 hour  Intake 1610 ml  Output -  Net 1610 ml   Filed Weights   04/24/17 2030  Weight: 107.2 kg (236 lb 5.3 oz)    Exam: General exam: In no acute distress. Respiratory system: Poor air movement with no wheezing Cardiovascular system: S1 & S2 heard, RRR. No JVD. Gastrointestinal system: Abdomen is nondistended, soft and nontender.  Central nervous system: Alert and oriented. No focal neurological deficits. Extremities: No pedal edema. Skin: No rashes, lesions or ulcers   Data Reviewed:    Labs: Basic Metabolic Panel: Recent Labs  Lab  04/24/17 1100 04/24/17 1809 04/25/17 0438  NA 136  --  138  K 3.9  --  4.2  CL 104  --  105  CO2 19*  --  22  GLUCOSE 152*  --  144*  BUN 24*  --  21*  CREATININE 1.18* 1.14* 0.94  CALCIUM 9.1  --  8.8*   GFR Estimated Creatinine Clearance: 65 mL/min (by C-G formula based on SCr of 0.94 mg/dL). Liver Function Tests: Recent Labs  Lab 04/24/17 1100 04/25/17 0438  AST 61* 56*  ALT 103* 94*  ALKPHOS 73 69  BILITOT 1.0 0.7  PROT 7.2 6.5  ALBUMIN 4.0 3.6   No results for input(s): LIPASE, AMYLASE in the last 168 hours. No results for input(s): AMMONIA in the last 168 hours. Coagulation  profile No results for input(s): INR, PROTIME in the last 168 hours.  CBC: Recent Labs  Lab 04/24/17 1100 04/24/17 1809 04/25/17 0438  WBC 6.7 5.0 6.1  NEUTROABS 5.4  --   --   HGB 14.0 12.8 13.0  HCT 41.3 39.4 39.4  MCV 100.2* 101.0* 100.5*  PLT 152 134* 147*   Cardiac Enzymes: No results for input(s): CKTOTAL, CKMB, CKMBINDEX, TROPONINI in the last 168 hours. BNP (last 3 results) No results for input(s): PROBNP in the last 8760 hours. CBG: No results for input(s): GLUCAP in the last 168 hours. D-Dimer: No results for input(s): DDIMER in the last 72 hours. Hgb A1c: No results for input(s): HGBA1C in the last 72 hours. Lipid Profile: No results for input(s): CHOL, HDL, LDLCALC, TRIG, CHOLHDL, LDLDIRECT in the last 72 hours. Thyroid function studies: No results for input(s): TSH, T4TOTAL, T3FREE, THYROIDAB in the last 72 hours.  Invalid input(s): FREET3 Anemia work up: No results for input(s): VITAMINB12, FOLATE, FERRITIN, TIBC, IRON, RETICCTPCT in the last 72 hours. Sepsis Labs: Recent Labs  Lab 04/24/17 1100 04/24/17 1115 04/24/17 1809 04/25/17 0438 04/25/17 0442  WBC 6.7  --  5.0 6.1  --   LATICACIDVEN  --  3.53*  --   --  1.7   Microbiology Recent Results (from the past 240 hour(s))  Respiratory Panel by PCR     Status: Abnormal   Collection Time: 04/24/17  3:04 PM  Result Value Ref Range Status   Adenovirus NOT DETECTED NOT DETECTED Final   Coronavirus 229E NOT DETECTED NOT DETECTED Final   Coronavirus HKU1 NOT DETECTED NOT DETECTED Final   Coronavirus NL63 NOT DETECTED NOT DETECTED Final   Coronavirus OC43 NOT DETECTED NOT DETECTED Final   Metapneumovirus NOT DETECTED NOT DETECTED Final   Rhinovirus / Enterovirus NOT DETECTED NOT DETECTED Final   Influenza A NOT DETECTED NOT DETECTED Final   Influenza B NOT DETECTED NOT DETECTED Final   Parainfluenza Virus 1 NOT DETECTED NOT DETECTED Final   Parainfluenza Virus 2 NOT DETECTED NOT DETECTED Final    Parainfluenza Virus 3 NOT DETECTED NOT DETECTED Final   Parainfluenza Virus 4 NOT DETECTED NOT DETECTED Final   Respiratory Syncytial Virus DETECTED (A) NOT DETECTED Final    Comment: CRITICAL RESULT CALLED TO, READ BACK BY AND VERIFIED WITH: Deeann Saint 0938 04/25/2017 T. TYSOR    Bordetella pertussis NOT DETECTED NOT DETECTED Final   Chlamydophila pneumoniae NOT DETECTED NOT DETECTED Final   Mycoplasma pneumoniae NOT DETECTED NOT DETECTED Final     Medications:   . enoxaparin (LOVENOX) injection  40 mg Subcutaneous Q24H  . ipratropium-albuterol  3 mL Nebulization BID  . methylPREDNISolone (SOLU-MEDROL) injection  60  mg Intravenous Q12H   Continuous Infusions: . azithromycin         LOS: 1 day   Charlynne Cousins  [facility] Pager [phone]  *Please refer to [email or web address], password [email or web address] to get updated schedule on who will round on this patient, as hospitalists switch teams weekly. If 7PM-7AM, please contact night-coverage at [email or web address], password [email or web address] for any overnight needs.  04/25/2017, 9:24 AM

## 2017-04-25 NOTE — Progress Notes (Signed)
Patient placed on CPAP at 6 cmH20 and tolerating well. 2l O2 bleed in with SATs 98% and Hr 85. BBS heard diminished.

## 2017-04-26 DIAGNOSIS — G4733 Obstructive sleep apnea (adult) (pediatric): Secondary | ICD-10-CM

## 2017-04-26 DIAGNOSIS — R945 Abnormal results of liver function studies: Secondary | ICD-10-CM

## 2017-04-26 DIAGNOSIS — R7989 Other specified abnormal findings of blood chemistry: Secondary | ICD-10-CM

## 2017-04-26 DIAGNOSIS — D696 Thrombocytopenia, unspecified: Secondary | ICD-10-CM

## 2017-04-26 DIAGNOSIS — R0602 Shortness of breath: Secondary | ICD-10-CM

## 2017-04-26 LAB — BASIC METABOLIC PANEL
ANION GAP: 10 (ref 5–15)
BUN: 25 mg/dL — AB (ref 6–20)
CALCIUM: 8.9 mg/dL (ref 8.9–10.3)
CO2: 24 mmol/L (ref 22–32)
Chloride: 104 mmol/L (ref 101–111)
Creatinine, Ser: 1.02 mg/dL — ABNORMAL HIGH (ref 0.44–1.00)
GFR calc Af Amer: >60 mL/min (ref >60)
GFR, EST NON AFRICAN AMERICAN: 55 mL/min — AB (ref >60)
GLUCOSE: 155 mg/dL — AB (ref 65–99)
Potassium: 4.2 mmol/L (ref 3.5–5.1)
SODIUM: 138 mmol/L (ref 135–145)

## 2017-04-26 LAB — CBC
HCT: 39.5 % (ref 36.0–46.0)
Hemoglobin: 13.2 g/dL (ref 12.0–15.0)
MCH: 33.6 pg (ref 26.0–34.0)
MCHC: 33.4 g/dL (ref 30.0–36.0)
MCV: 100.5 fL — ABNORMAL HIGH (ref 78.0–100.0)
Platelets: 147 10*3/uL — ABNORMAL LOW (ref 150–400)
RBC: 3.93 MIL/uL (ref 3.87–5.11)
RDW: 12.3 % (ref 11.5–15.5)
WBC: 7.3 10*3/uL (ref 4.0–10.5)

## 2017-04-26 MED ORDER — SERTRALINE HCL 100 MG PO TABS
200.0000 mg | ORAL_TABLET | Freq: Every day | ORAL | Status: DC
  Administered 2017-04-26 – 2017-05-02 (×7): 200 mg via ORAL
  Filled 2017-04-26 (×7): qty 2

## 2017-04-26 MED ORDER — SODIUM CHLORIDE 0.9 % IV SOLN
INTRAVENOUS | Status: DC
  Administered 2017-04-26 – 2017-04-27 (×2): via INTRAVENOUS

## 2017-04-26 MED ORDER — IPRATROPIUM BROMIDE 0.02 % IN SOLN
0.5000 mg | Freq: Four times a day (QID) | RESPIRATORY_TRACT | Status: DC
  Administered 2017-04-26 – 2017-04-29 (×13): 0.5 mg via RESPIRATORY_TRACT
  Filled 2017-04-26 (×14): qty 2.5

## 2017-04-26 MED ORDER — LOSARTAN POTASSIUM 50 MG PO TABS
50.0000 mg | ORAL_TABLET | Freq: Every day | ORAL | Status: DC
  Administered 2017-04-26 – 2017-05-02 (×7): 50 mg via ORAL
  Filled 2017-04-26 (×7): qty 1

## 2017-04-26 MED ORDER — BUDESONIDE 0.25 MG/2ML IN SUSP
0.2500 mg | Freq: Two times a day (BID) | RESPIRATORY_TRACT | Status: DC
  Administered 2017-04-26 – 2017-05-02 (×12): 0.25 mg via RESPIRATORY_TRACT
  Filled 2017-04-26 (×12): qty 2

## 2017-04-26 MED ORDER — GUAIFENESIN ER 600 MG PO TB12
1200.0000 mg | ORAL_TABLET | Freq: Two times a day (BID) | ORAL | Status: DC
  Administered 2017-04-26 – 2017-04-28 (×5): 1200 mg via ORAL
  Filled 2017-04-26 (×5): qty 2

## 2017-04-26 MED ORDER — DEXTROSE 5 % IV SOLN
250.0000 mg | INTRAVENOUS | Status: DC
  Administered 2017-04-27 – 2017-04-30 (×4): 250 mg via INTRAVENOUS
  Filled 2017-04-26 (×4): qty 250

## 2017-04-26 MED ORDER — LEVALBUTEROL HCL 0.63 MG/3ML IN NEBU
0.6300 mg | INHALATION_SOLUTION | Freq: Four times a day (QID) | RESPIRATORY_TRACT | Status: DC
  Administered 2017-04-26 – 2017-04-29 (×13): 0.63 mg via RESPIRATORY_TRACT
  Filled 2017-04-26 (×14): qty 3

## 2017-04-26 MED ORDER — FLUTICASONE PROPIONATE 50 MCG/ACT NA SUSP
2.0000 | Freq: Every day | NASAL | Status: DC | PRN
  Filled 2017-04-26: qty 16

## 2017-04-26 MED ORDER — VITAMIN D 1000 UNITS PO TABS
1000.0000 [IU] | ORAL_TABLET | Freq: Every day | ORAL | Status: DC
Start: 2017-04-26 — End: 2017-05-02
  Administered 2017-04-26 – 2017-05-02 (×7): 1000 [IU] via ORAL
  Filled 2017-04-26 (×7): qty 1

## 2017-04-26 MED ORDER — AZITHROMYCIN 250 MG PO TABS: 250.0000 mg | ORAL_TABLET | Freq: Every day | ORAL | Status: DC

## 2017-04-26 NOTE — Progress Notes (Addendum)
PROGRESS NOTE    Michelle [name]  HCW:[id number] DOB: 13-Aug-1945 DOA: 04/24/2017 PCP: Fanny Bien, MD   Brief Narrative:  Michelle [name] is an 72 y.o. female past medical history significant for arthritis presents with complaints of because of wheezing and shortness of breath several days prior to admission. Chest x-ray on admission showed no evidence of pneumonia likely acute bronchitis. Respiratory Virus Panel positive.  Assessment & Plan:   Principal Problem:   Acute hypoxemic respiratory failure (HCC) Active Problems:   Obstructive sleep apnea   Essential hypertension   Morbid obesity (HCC)   Acute bronchitis   COPD exacerbation (HCC)  Acute Respiratory Failure with Hypoxia 2/2 to Acute RSV Bronchitis -CXR showed Mediastinum hilar structures normal. Volumes with mild basilar atelectasis. No pleural effusion or pneumothorax. Heart size normal.No acute bony abnormality. -Changed DuoNeb to Xopenex/Atrovent q6h; C/w DuoNeb q2hprn -C/w IV Methylprednisolone 60 mg q12h and Azithromycin -C/w Supplemental O2 via [place] -Added Budesonide 0.25 mg Neb BID -C/w Benzonatate 100 mg po TIDprn Cough, Guaifenesin 1200 mg po BID, and Robitussin Liquid po q4hprn Cough -Added Flutter Valve and Incentive Spirometry  -C/w IV Azithromycin; Consider changing to Levofloxacin if not improving   -Influenza PCR is negative. -RSV was Positive in respiratory virus panel -Repeat CXR in AM  -Will need Ambulatory Pulse Oximetry Screening   Obstructive Sleep Apnea -C/w CPAP qHS   Essential Hypertension Continue current home regimen blood pressure stable.  Morbid obesity ([place]) -Weight Loss Counseling given.  Elevated Lactic Acid, improved -Lactic Acid Level went 3.53 -> 1.7 -Likely due to Hypoxia unlikely sepsis.  Elevated BUN/Cr -Unknown Baseline but BUN/Cr 25/1.02 today  -Start Gentle IVF Rehydration at 75 mL/hr -Continue to Monitor and Repeat CMP in AM  Abnormal LFT's -Trending  Down -AST went from 61 -> 56 -ALT went from 103 -> 94 -Check Acute Hepatitis Panel and RUQ U/S -Repeat CMP in AM  Thrombocytopenia -Patient's Platelet Count went from 152 -> 134 and now is 147 -Unclear Etiology -Continue to Monitor and Repeat CBC in AM  DVT prophylaxis: Enoxaparin 40 mg sq q24h Code Status: FULL CODE Family Communication: No family present at bedside  Disposition Plan: Anticipated D/C Home within the next 24-48 hours  Consultants:   None   Procedures: None  Antimicrobials:  Anti-infectives (From admission, onward)   Start     Dose/Rate Route Frequency Ordered Stop   04/27/17 1000  azithromycin (ZITHROMAX) tablet 250 mg  Status:  Discontinued     250 mg Oral Daily 04/26/17 1401 04/26/17 1403   04/27/17 1000  azithromycin (ZITHROMAX) 250 mg in dextrose 5 % 125 mL IVPB     250 mg 125 mL/hr over 60 Minutes Intravenous Every 24 hours 04/26/17 1403     04/25/17 1000  azithromycin (ZITHROMAX) 250 mg in dextrose 5 % 125 mL IVPB  Status:  Discontinued     250 mg 125 mL/hr over 60 Minutes Intravenous Every 24 hours 04/24/17 1503 04/26/17 1401   04/24/17 1200  azithromycin (ZITHROMAX) 500 mg in dextrose 5 % 250 mL IVPB     500 mg 250 mL/hr over 60 Minutes Intravenous  Once 04/24/17 1151 04/24/17 1340     Subjective: Seen and examined and stated she had a severe cough but was unable to cough sputum up. Also felt "jittery" and tremulous and thinks its from the breathing Treatments. No CP but SOB still there especially when coughing.   Objective: Vitals:   04/26/17 1339 04/26/17 1340 04/26/17 1400 04/26/17  1434  BP:    (!) 156/81  Pulse: 83 (!) 39 80   Resp:      Temp:    98.6 F (37 C)  TempSrc:    Oral  SpO2: (!) 87% 95% 93% 93%  Weight:        Intake/Output Summary (Last 24 hours) at 04/26/2017 1639 Last data filed at 04/26/2017 1400 Gross per 24 hour  Intake 670 ml  Output -  Net 670 ml   Filed Weights   04/24/17 2030  Weight: 107.2 kg (236 lb 5.3  oz)   Examination: Physical Exam:  Constitutional: WN/WD obese Caucasian female in NAD and appears calm  Eyes: Lids and conjunctivae normal, sclerae anicteric  ENMT: External Ears, Nose appear normal. Grossly normal hearing. Mucous membranes are moist.  Neck: Appears normal, supple, no cervical masses, normal ROM, no appreciable thyromegaly, no JVD Respiratory: Diminished to auscultation bilaterally with some wheezing; No appreciable rales, rhonchi or crackles. Slightly increased respiratory effort. No accessory muscle use but patient is coughing.  Cardiovascular: RRR, no murmurs / rubs / gallops. S1 and S2 auscultated. No extremity edema. Abdomen: Soft, non-tender, Distended due to body habitus. No masses palpated. No appreciable hepatosplenomegaly. Bowel sounds positive x4.  GU: Deferred. Musculoskeletal: No clubbing / cyanosis of digits/nails. No joint deformity upper and lower extremities Skin: No rashes, lesions, ulcers on a limited skin eval. No induration; Warm and dry.  Neurologic: CN 2-12 grossly intact with no focal deficits. Romberg sign and cerebellar reflexes not assessed.  Psychiatric: Normal judgment and insight. Alert and oriented x 3. Normal mood and appropriate affect.   Data Reviewed: I have personally reviewed following labs and imaging studies  CBC: Recent Labs  Lab 04/24/17 1100 04/24/17 1809 04/25/17 0438 04/26/17 0325  WBC 6.7 5.0 6.1 7.3  NEUTROABS 5.4  --   --   --   HGB 14.0 12.8 13.0 13.2  HCT 41.3 39.4 39.4 39.5  MCV 100.2* 101.0* 100.5* 100.5*  PLT 152 134* 147* 433*   Basic Metabolic Panel: Recent Labs  Lab 04/24/17 1100 04/24/17 1809 04/25/17 0438 04/26/17 0325  NA 136  --  138 138  K 3.9  --  4.2 4.2  CL 104  --  105 104  CO2 19*  --  22 24  GLUCOSE 152*  --  144* 155*  BUN 24*  --  21* 25*  CREATININE 1.18* 1.14* 0.94 1.02*  CALCIUM 9.1  --  8.8* 8.9   GFR: Estimated Creatinine Clearance: 59.9 mL/min (A) (by C-G formula based on SCr  of 1.02 mg/dL (H)). Liver Function Tests: Recent Labs  Lab 04/24/17 1100 04/25/17 0438  AST 61* 56*  ALT 103* 94*  ALKPHOS 73 69  BILITOT 1.0 0.7  PROT 7.2 6.5  ALBUMIN 4.0 3.6   No results for input(s): LIPASE, AMYLASE in the last 168 hours. No results for input(s): AMMONIA in the last 168 hours. Coagulation Profile: No results for input(s): INR, PROTIME in the last 168 hours. Cardiac Enzymes: No results for input(s): CKTOTAL, CKMB, CKMBINDEX, TROPONINI in the last 168 hours. BNP (last 3 results) No results for input(s): PROBNP in the last 8760 hours. HbA1C: No results for input(s): HGBA1C in the last 72 hours. CBG: No results for input(s): GLUCAP in the last 168 hours. Lipid Profile: No results for input(s): CHOL, HDL, LDLCALC, TRIG, CHOLHDL, LDLDIRECT in the last 72 hours. Thyroid Function Tests: No results for input(s): TSH, T4TOTAL, FREET4, T3FREE, THYROIDAB in the last 72 hours.  Anemia Panel: No results for input(s): VITAMINB12, FOLATE, FERRITIN, TIBC, IRON, RETICCTPCT in the last 72 hours. Sepsis Labs: Recent Labs  Lab 04/24/17 1115 04/25/17 0442  LATICACIDVEN 3.53* 1.7   Recent Results (from the past 240 hour(s))  Blood culture (routine x 2)     Status: None (Preliminary result)   Collection Time: 04/24/17 12:26 PM  Result Value Ref Range Status   Specimen Description BLOOD RIGHT ANTECUBITAL  Final   Special Requests   Final    BOTTLES DRAWN AEROBIC AND ANAEROBIC Blood Culture adequate volume   Culture NO GROWTH 2 DAYS  Final   Report Status PENDING  Incomplete  Blood culture (routine x 2)     Status: None (Preliminary result)   Collection Time: 04/24/17 12:35 PM  Result Value Ref Range Status   Specimen Description BLOOD RIGHT HAND  Final   Special Requests   Final    BOTTLES DRAWN AEROBIC AND ANAEROBIC Blood Culture adequate volume   Culture NO GROWTH 2 DAYS  Final   Report Status PENDING  Incomplete  Respiratory Panel by PCR     Status: Abnormal    Collection Time: 04/24/17  3:04 PM  Result Value Ref Range Status   Adenovirus NOT DETECTED NOT DETECTED Final   Coronavirus 229E NOT DETECTED NOT DETECTED Final   Coronavirus HKU1 NOT DETECTED NOT DETECTED Final   Coronavirus NL63 NOT DETECTED NOT DETECTED Final   Coronavirus OC43 NOT DETECTED NOT DETECTED Final   Metapneumovirus NOT DETECTED NOT DETECTED Final   Rhinovirus / Enterovirus NOT DETECTED NOT DETECTED Final   Influenza A NOT DETECTED NOT DETECTED Final   Influenza B NOT DETECTED NOT DETECTED Final   Parainfluenza Virus 1 NOT DETECTED NOT DETECTED Final   Parainfluenza Virus 2 NOT DETECTED NOT DETECTED Final   Parainfluenza Virus 3 NOT DETECTED NOT DETECTED Final   Parainfluenza Virus 4 NOT DETECTED NOT DETECTED Final   Respiratory Syncytial Virus DETECTED (A) NOT DETECTED Final    Comment: CRITICAL RESULT CALLED TO, READ BACK BY AND VERIFIED WITH: Deeann Saint [id number] 04/25/2017 T. TYSOR    Bordetella pertussis NOT DETECTED NOT DETECTED Final   Chlamydophila pneumoniae NOT DETECTED NOT DETECTED Final   Mycoplasma pneumoniae NOT DETECTED NOT DETECTED Final    Radiology Studies: No results found.  Scheduled Meds: . budesonide (PULMICORT) nebulizer solution  0.25 mg Nebulization BID  . cholecalciferol  1,000 Units Oral Daily  . enoxaparin (LOVENOX) injection  40 mg Subcutaneous Q24H  . guaiFENesin  1,200 mg Oral BID  . ipratropium  0.5 mg Nebulization Q6H  . levalbuterol  0.63 mg Nebulization Q6H  . losartan  50 mg Oral Daily  . methylPREDNISolone (SOLU-MEDROL) injection  60 mg Intravenous Q12H  . sertraline  200 mg Oral Daily   Continuous Infusions: . sodium chloride 75 mL/hr at 04/26/17 1132  . [START ON 04/27/2017] azithromycin      LOS: 2 days   Kerney Elbe, DO [facility] Pager [phone]  If 7PM-7AM, please contact night-coverage [email or web address] Password [facility] 04/26/2017, 4:39 PM

## 2017-04-26 NOTE — Progress Notes (Signed)
Pt placed on CPAP 7cmH20 with 2L O2 bled in. HR 85, SpO2 95%. RT will continue to monitor.

## 2017-04-26 NOTE — Telephone Encounter (Signed)
Please check overnight pulse ox on RA and CPAP

## 2017-04-27 ENCOUNTER — Inpatient Hospital Stay (HOSPITAL_COMMUNITY): Payer: Medicare Other

## 2017-04-27 DIAGNOSIS — F1010 Alcohol abuse, uncomplicated: Secondary | ICD-10-CM

## 2017-04-27 DIAGNOSIS — D696 Thrombocytopenia, unspecified: Secondary | ICD-10-CM

## 2017-04-27 LAB — COMPREHENSIVE METABOLIC PANEL WITH GFR
ALT: 101 U/L — ABNORMAL HIGH (ref 14–54)
AST: 53 U/L — ABNORMAL HIGH (ref 15–41)
Albumin: 3.5 g/dL (ref 3.5–5.0)
Alkaline Phosphatase: 62 U/L (ref 38–126)
Anion gap: 9 (ref 5–15)
BUN: 32 mg/dL — ABNORMAL HIGH (ref 6–20)
CO2: 23 mmol/L (ref 22–32)
Calcium: 8.6 mg/dL — ABNORMAL LOW (ref 8.9–10.3)
Chloride: 102 mmol/L (ref 101–111)
Creatinine, Ser: 1.08 mg/dL — ABNORMAL HIGH (ref 0.44–1.00)
GFR calc Af Amer: 59 mL/min — ABNORMAL LOW
GFR calc non Af Amer: 51 mL/min — ABNORMAL LOW
Glucose, Bld: 157 mg/dL — ABNORMAL HIGH (ref 65–99)
Potassium: 3.9 mmol/L (ref 3.5–5.1)
Sodium: 134 mmol/L — ABNORMAL LOW (ref 135–145)
Total Bilirubin: 0.7 mg/dL (ref 0.3–1.2)
Total Protein: 6.7 g/dL (ref 6.5–8.1)

## 2017-04-27 LAB — CBC WITH DIFFERENTIAL/PLATELET
Basophils Absolute: 0.1 K/uL (ref 0.0–0.1)
Basophils Relative: 1 %
Eosinophils Absolute: 0 K/uL (ref 0.0–0.7)
Eosinophils Relative: 0 %
HCT: 38.9 % (ref 36.0–46.0)
Hemoglobin: 12.8 g/dL (ref 12.0–15.0)
Lymphocytes Relative: 23 %
Lymphs Abs: 1.9 K/uL (ref 0.7–4.0)
MCH: 33 pg (ref 26.0–34.0)
MCHC: 32.9 g/dL (ref 30.0–36.0)
MCV: 100.3 fL — ABNORMAL HIGH (ref 78.0–100.0)
Monocytes Absolute: 1 K/uL (ref 0.1–1.0)
Monocytes Relative: 12 %
Neutro Abs: 5.1 K/uL (ref 1.7–7.7)
Neutrophils Relative %: 64 %
Platelets: 147 K/uL — ABNORMAL LOW (ref 150–400)
RBC: 3.88 MIL/uL (ref 3.87–5.11)
RDW: 12.4 % (ref 11.5–15.5)
WBC: 8.1 K/uL (ref 4.0–10.5)

## 2017-04-27 LAB — MAGNESIUM: Magnesium: 2 mg/dL (ref 1.7–2.4)

## 2017-04-27 MED ORDER — LORAZEPAM 1 MG PO TABS: 1.0000 mg | ORAL_TABLET | Freq: Four times a day (QID) | ORAL | Status: AC | PRN

## 2017-04-27 MED ORDER — VITAMIN B-1 100 MG PO TABS
100.0000 mg | ORAL_TABLET | Freq: Every day | ORAL | Status: DC
  Administered 2017-04-27 – 2017-05-02 (×5): 100 mg via ORAL
  Filled 2017-04-27 (×7): qty 1

## 2017-04-27 MED ORDER — LORAZEPAM 2 MG/ML IJ SOLN
0.0000 mg | Freq: Four times a day (QID) | INTRAMUSCULAR | Status: AC
  Filled 2017-04-27: qty 1

## 2017-04-27 MED ORDER — LORAZEPAM 2 MG/ML IJ SOLN: 0.0000 mg | Freq: Two times a day (BID) | INTRAMUSCULAR | Status: AC

## 2017-04-27 MED ORDER — ADULT MULTIVITAMIN W/MINERALS CH
1.0000 | ORAL_TABLET | Freq: Every day | ORAL | Status: DC
  Administered 2017-04-27 – 2017-05-02 (×6): 1 via ORAL
  Filled 2017-04-27 (×6): qty 1

## 2017-04-27 MED ORDER — METHYLPREDNISOLONE SODIUM SUCC 125 MG IJ SOLR
60.0000 mg | Freq: Four times a day (QID) | INTRAMUSCULAR | Status: DC
  Administered 2017-04-27 – 2017-04-28 (×5): 60 mg via INTRAVENOUS
  Filled 2017-04-27 (×5): qty 2

## 2017-04-27 MED ORDER — LORAZEPAM 2 MG/ML IJ SOLN
1.0000 mg | Freq: Four times a day (QID) | INTRAMUSCULAR | Status: AC | PRN
  Administered 2017-04-27: 1 mg via INTRAVENOUS

## 2017-04-27 MED ORDER — FOLIC ACID 1 MG PO TABS
1.0000 mg | ORAL_TABLET | Freq: Every day | ORAL | Status: DC
  Administered 2017-04-27 – 2017-05-02 (×6): 1 mg via ORAL
  Filled 2017-04-27 (×6): qty 1

## 2017-04-27 MED ORDER — THIAMINE HCL 100 MG/ML IJ SOLN
100.0000 mg | Freq: Every day | INTRAMUSCULAR | Status: DC
  Administered 2017-04-28: 100 mg via INTRAVENOUS
  Filled 2017-04-27 (×2): qty 2

## 2017-04-27 NOTE — Plan of Care (Signed)
  Clinical Measurements: Respiratory complications will improve. Pt O2 sat decreased from 93% to 89% on 2L [place] while ambulating. Pt currently at 3L [place]. No other complaints of sob. Pt wore cpap throughout night and tolerated it well. Will continue to monitor pt this shift.  04/27/2017 0452 - Progressing by Mellissa Kohut, RN

## 2017-04-27 NOTE — Progress Notes (Signed)
  Placed patient on CPAP via [facility], 9.0 cm H20 (previous settings) with 2 lpm O2 bleed in. Tolerating well at this time.

## 2017-04-27 NOTE — Progress Notes (Signed)
SATURATION QUALIFICATIONS: (This note is used to comply with regulatory documentation for home oxygen)  Patient Saturations on Room Air at Rest = 91%  Patient Saturations on Room Air while Ambulating = 87%  Patient Saturations on 2L Liters of oxygen while Ambulating = 90%  Please briefly explain why patient needs home oxygen: Patient saturations dropped below a safe range during ambulation.

## 2017-04-27 NOTE — Progress Notes (Signed)
PROGRESS NOTE    Michelle [name]  JHE:[id number] DOB: 1945-12-11 DOA: 04/24/2017 PCP: Fanny Bien, MD   Brief Narrative:  Michelle [name] is an 72 y.o. female past medical history significant for arthritis presents with complaints of because of wheezing and shortness of breath several days prior to admission. Chest x-ray on admission showed no evidence of pneumonia likely acute bronchitis. Respiratory Virus Panel positive. She was placed on CIWA Protocol because of concerns for EtOH Withdrawals.   Assessment & Plan:   Principal Problem:   Acute hypoxemic respiratory failure (HCC) Active Problems:   Obstructive sleep apnea   Essential hypertension   Morbid obesity (HCC)   Acute bronchitis   COPD exacerbation (HCC)   Abnormal LFTs   Thrombocytopenia (HCC)  Acute Respiratory Failure with Hypoxia 2/2 to Acute RSV Bronchitis -Initial CXR showed Mediastinum hilar structures normal. Volumes with mild basilar atelectasis. No pleural effusion or pneumothorax. Heart size normal.No acute bony abnormality. -Changed DuoNeb to Xopenex/Atrovent q6h; C/w DuoNeb q2hprn -IV Methylprednisolone 60 mg q12h changed to 60 mg q6h as patient was still wheezing; Wean as tolerated  -C/w Azithromycin started on Admission -C/w Supplemental O2 via [place] -Added Budesonide 0.25 mg Neb BID -C/w Benzonatate 100 mg po TIDprn Cough, Guaifenesin 1200 mg po BID, and Robitussin Liquid po q4hprn Cough -Added Flutter Valve and Incentive Spirometry  -C/w IV Azithromycin; Consider changing to Levofloxacin if not improving   -Influenza PCR is negative. -RSV was Positive in respiratory virus panel -Repeat CXR this AM showed There is bilateral chronic interstitial prominence. There is no focal parenchymal opacity. There is no pleural effusion or pneumothorax. The heart and mediastinal contours are unremarkable. The osseous structures are unremarkable. -Will need Ambulatory Pulse Oximetry Screening prior to D/C as she  still remains hypoxic while ambulating  -If continues to remain Hypoxic and not improving will consider obtaining a CTA of the Chest to r/o PE  Obstructive Sleep Apnea -C/w CPAP qHS   Essential Hypertension -BP was 144/96 -Continue current home regimen with Losartan 50 mg po Daily as blood pressure stable.  Morbid obesity ([place]) -Weight Loss Counseling given.  Elevated Lactic Acid, improved -Lactic Acid Level went 3.53 -> 1.7 -Likely due to Hypoxia unlikely sepsis.  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 -Patient admittedly drinks 1-2 glasses of Wine nightly -Started Med Surge CIWA Protocol  -Check RUQ U/S -C/w Folic Acid, MVI, Thiamine   Hyponatremia -Patient's Na+ was 134 -C/w IVF Rehydration with NS at 75 mL/hr -Continue to Monitor and repeat CMP in AM   DVT prophylaxis: Enoxaparin 40 mg sq q24h Code Status: FULL CODE Family Communication: No family present at bedside  Disposition Plan: Anticipated D/C Home within the next 24-48 hours  Consultants:   None   Procedures: None  Antimicrobials:  Anti-infectives (From admission, onward)   Start     Dose/Rate Route Frequency Ordered Stop   04/27/17 1000  azithromycin (ZITHROMAX)  tablet 250 mg  Status:  Discontinued     250 mg Oral Daily 04/26/17 1401 04/26/17 1403   04/27/17 1000  azithromycin (ZITHROMAX) 250 mg in dextrose 5 % 125 mL IVPB     250 mg 125 mL/hr over 60 Minutes Intravenous Every 24  hours 04/26/17 1403     04/25/17 1000  azithromycin (ZITHROMAX) 250 mg in dextrose 5 % 125 mL IVPB  Status:  Discontinued     250 mg 125 mL/hr over 60 Minutes Intravenous Every 24 hours 04/24/17 1503 04/26/17 1401   04/24/17 1200  azithromycin (ZITHROMAX) 500 mg in dextrose 5 % 250 mL IVPB     500 mg 250 mL/hr over 60 Minutes Intravenous  Once 04/24/17 1151 04/24/17 1340     Subjective: Seen and examined and stated her jittery feeling has improved. Still coughing but thinks it is looser and is able to bring more up but not tremendously. Feels as if she is getting slowly better.   Objective: Vitals:   04/27/17 0400 04/27/17 0550 04/27/17 0832 04/27/17 0838  BP:  (!) 144/96    Pulse: 73 68    Resp:      Temp:  97.6 F (36.4 C)    TempSrc:  Axillary    SpO2: 94% 94% 94% 98%  Weight:  105.8 kg (233 lb 4.8 oz)      Intake/Output Summary (Last 24 hours) at 04/27/2017 1514 Last data filed at 04/27/2017 1100 Gross per 24 hour  Intake 1220 ml  Output -  Net 1220 ml   Filed Weights   04/24/17 2030 04/27/17 0550  Weight: 107.2 kg (236 lb 5.3 oz) 105.8 kg (233 lb 4.8 oz)   Examination: Physical Exam:  Constitutional: WN/WD obese Caucasian female in NAD and appears calm Eyes: Sclerae anicteric. Lids normal ENMT: External Ears and Nose appear normal. MMM Neck: Appears supple. No JVD Respiratory: Diminished to auscultation with bilateral wheezing. No appreciable rales or crackles. No accessory muscle use but is wearing supplemental O2 via [place] Cardiovascular: RRR; No appreciable m/r/g. No extremity edema Abdomen: Soft, NT, Distended due to body habitus; Bowel sounds present GU: Deferred Musculoskeletal: No contractures; No cyanosis Skin: No appreciable rashes, ulcers on a limited skin eval Neurologic: CN 2-12 grossly intact. No appreciable focal deficits Psychiatric: Normal mood and affect. Intact judgement and insight  Data Reviewed: I have personally reviewed following labs and  imaging studies  CBC: Recent Labs  Lab 04/24/17 1100 04/24/17 1809 04/25/17 0438 04/26/17 0325 04/27/17 0346  WBC 6.7 5.0 6.1 7.3 8.1  NEUTROABS 5.4  --   --   --  5.1  HGB 14.0 12.8 13.0 13.2 12.8  HCT 41.3 39.4 39.4 39.5 38.9  MCV 100.2* 101.0* 100.5* 100.5* 100.3*  PLT 152 134* 147* 147* 272*   Basic Metabolic Panel: Recent Labs  Lab 04/24/17 1100 04/24/17 1809 04/25/17 0438 04/26/17 0325 04/27/17 0346  NA 136  --  138 138 134*  K 3.9  --  4.2 4.2 3.9  CL 104  --  105 104 102  CO2 19*  --  22 24 23   GLUCOSE 152*  --  144* 155* 157*  BUN 24*  --  21* 25* 32*  CREATININE 1.18* 1.14* 0.94 1.02* 1.08*  CALCIUM 9.1  --  8.8* 8.9 8.6*  MG  --   --   --   --  2.0   GFR: Estimated Creatinine Clearance: 56.2 mL/min (A) (by C-G formula based on SCr of 1.08 mg/dL (H)).  Liver Function Tests: Recent Labs  Lab 04/24/17 1100 04/25/17 0438 04/27/17 0346  AST 61* 56* 53*  ALT 103* 94* 101*  ALKPHOS 73 69 62  BILITOT 1.0 0.7 0.7  PROT 7.2 6.5 6.7  ALBUMIN 4.0 3.6 3.5   No results for input(s): LIPASE, AMYLASE in the last 168 hours. No results for input(s): AMMONIA in the last 168 hours. Coagulation Profile: No results for input(s): INR, PROTIME in the last 168 hours. Cardiac Enzymes: No results for input(s): CKTOTAL, CKMB, CKMBINDEX, TROPONINI in the last 168 hours. BNP (last 3 results) No results for input(s): PROBNP in the last 8760 hours. HbA1C: No results for input(s): HGBA1C in the last 72 hours. CBG: No results for input(s): GLUCAP in the last 168 hours. Lipid Profile: No results for input(s): CHOL, HDL, LDLCALC, TRIG, CHOLHDL, LDLDIRECT in the last 72 hours. Thyroid Function Tests: No results for input(s): TSH, T4TOTAL, FREET4, T3FREE, THYROIDAB in the last 72 hours. Anemia Panel: No results for input(s): VITAMINB12, FOLATE, FERRITIN, TIBC, IRON, RETICCTPCT in the last 72 hours. Sepsis Labs: Recent Labs  Lab 04/24/17 1115 04/25/17 0442  LATICACIDVEN  3.53* 1.7   Recent Results (from the past 240 hour(s))  Blood culture (routine x 2)     Status: None (Preliminary result)   Collection Time: 04/24/17 12:26 PM  Result Value Ref Range Status   Specimen Description BLOOD RIGHT ANTECUBITAL  Final   Special Requests   Final    BOTTLES DRAWN AEROBIC AND ANAEROBIC Blood Culture adequate volume   Culture NO GROWTH 3 DAYS  Final   Report Status PENDING  Incomplete  Blood culture (routine x 2)     Status: None (Preliminary result)   Collection Time: 04/24/17 12:35 PM  Result Value Ref Range Status   Specimen Description BLOOD RIGHT HAND  Final   Special Requests   Final    BOTTLES DRAWN AEROBIC AND ANAEROBIC Blood Culture adequate volume   Culture NO GROWTH 3 DAYS  Final   Report Status PENDING  Incomplete  Respiratory Panel by PCR     Status: Abnormal   Collection Time: 04/24/17  3:04 PM  Result Value Ref Range Status   Adenovirus NOT DETECTED NOT DETECTED Final   Coronavirus 229E NOT DETECTED NOT DETECTED Final   Coronavirus HKU1 NOT DETECTED NOT DETECTED Final   Coronavirus NL63 NOT DETECTED NOT DETECTED Final   Coronavirus OC43 NOT DETECTED NOT DETECTED Final   Metapneumovirus NOT DETECTED NOT DETECTED Final   Rhinovirus / Enterovirus NOT DETECTED NOT DETECTED Final   Influenza A NOT DETECTED NOT DETECTED Final   Influenza B NOT DETECTED NOT DETECTED Final   Parainfluenza Virus 1 NOT DETECTED NOT DETECTED Final   Parainfluenza Virus 2 NOT DETECTED NOT DETECTED Final   Parainfluenza Virus 3 NOT DETECTED NOT DETECTED Final   Parainfluenza Virus 4 NOT DETECTED NOT DETECTED Final   Respiratory Syncytial Virus DETECTED (A) NOT DETECTED Final    Comment: CRITICAL RESULT CALLED TO, READ BACK BY AND VERIFIED WITH: Deeann Saint [id number] 04/25/2017 T. TYSOR    Bordetella pertussis NOT DETECTED NOT DETECTED Final   Chlamydophila pneumoniae NOT DETECTED NOT DETECTED Final   Mycoplasma pneumoniae NOT DETECTED NOT DETECTED Final    Radiology  Studies: Dg Chest 2 View  Result Date: 04/27/2017 CLINICAL DATA:  Shortness of breath, dry cough, congestion for 3 days EXAM: CHEST  2 VIEW COMPARISON:  04/24/2017 FINDINGS: There is bilateral chronic interstitial prominence. There is no focal parenchymal opacity. There is no  pleural effusion or pneumothorax. The heart and mediastinal contours are unremarkable. The osseous structures are unremarkable. IMPRESSION: No active cardiopulmonary disease. Electronically Signed   By: Kathreen Devoid   On: 04/27/2017 12:50    Scheduled Meds: . budesonide (PULMICORT) nebulizer solution  0.25 mg Nebulization BID  . cholecalciferol  1,000 Units Oral Daily  . enoxaparin (LOVENOX) injection  40 mg Subcutaneous Q24H  . folic acid  1 mg Oral Daily  . guaiFENesin  1,200 mg Oral BID  . ipratropium  0.5 mg Nebulization Q6H  . levalbuterol  0.63 mg Nebulization Q6H  . LORazepam  0-4 mg Intravenous Q6H   Followed by  . [START ON 04/29/2017] LORazepam  0-4 mg Intravenous Q12H  . losartan  50 mg Oral Daily  . methylPREDNISolone (SOLU-MEDROL) injection  60 mg Intravenous Q6H  . multivitamin with minerals  1 tablet Oral Daily  . sertraline  200 mg Oral Daily  . thiamine  100 mg Oral Daily   Or  . thiamine  100 mg Intravenous Daily   Continuous Infusions: . sodium chloride 75 mL/hr at 04/27/17 1209  . azithromycin Stopped (04/27/17 1100)    LOS: 3 days   Kerney Elbe, DO [facility] Pager [phone]  If 7PM-7AM, please contact night-coverage [email or web address] Password [facility] 04/27/2017, 3:14 PM

## 2017-04-28 LAB — CBC WITH DIFFERENTIAL/PLATELET
BASOS ABS: 0.1 10*3/uL (ref 0.0–0.1)
Basophils Relative: 1 %
Eosinophils Absolute: 0 10*3/uL (ref 0.0–0.7)
Eosinophils Relative: 0 %
HCT: 41.3 % (ref 36.0–46.0)
Hemoglobin: 14.1 g/dL (ref 12.0–15.0)
LYMPHS ABS: 1.6 10*3/uL (ref 0.7–4.0)
Lymphocytes Relative: 18 %
MCH: 34.1 pg — ABNORMAL HIGH (ref 26.0–34.0)
MCHC: 34.1 g/dL (ref 30.0–36.0)
MCV: 99.8 fL (ref 78.0–100.0)
MONO ABS: 0.4 10*3/uL (ref 0.1–1.0)
Monocytes Relative: 5 %
Neutro Abs: 6.6 10*3/uL (ref 1.7–7.7)
Neutrophils Relative %: 76 %
PLATELETS: 159 10*3/uL (ref 150–400)
RBC: 4.14 MIL/uL (ref 3.87–5.11)
RDW: 12.6 % (ref 11.5–15.5)
WBC: 8.7 10*3/uL (ref 4.0–10.5)

## 2017-04-28 LAB — COMPREHENSIVE METABOLIC PANEL
ALBUMIN: 3.5 g/dL (ref 3.5–5.0)
ALT: 136 U/L — ABNORMAL HIGH (ref 14–54)
AST: 63 U/L — AB (ref 15–41)
Alkaline Phosphatase: 60 U/L (ref 38–126)
Anion gap: 11 (ref 5–15)
BUN: 30 mg/dL — AB (ref 6–20)
CHLORIDE: 104 mmol/L (ref 101–111)
CO2: 19 mmol/L — ABNORMAL LOW (ref 22–32)
Calcium: 8.5 mg/dL — ABNORMAL LOW (ref 8.9–10.3)
Creatinine, Ser: 1.05 mg/dL — ABNORMAL HIGH (ref 0.44–1.00)
GFR calc Af Amer: >60 mL/min (ref >60)
GFR calc non Af Amer: 53 mL/min — ABNORMAL LOW (ref >60)
GLUCOSE: 218 mg/dL — AB (ref 65–99)
Potassium: 4.3 mmol/L (ref 3.5–5.1)
Sodium: 134 mmol/L — ABNORMAL LOW (ref 135–145)
Total Bilirubin: 0.9 mg/dL (ref 0.3–1.2)
Total Protein: 6.8 g/dL (ref 6.5–8.1)

## 2017-04-28 LAB — PHOSPHORUS: Phosphorus: 4.2 mg/dL (ref 2.5–4.6)

## 2017-04-28 LAB — HEPATITIS PANEL, ACUTE
HEP B C IGM: NEGATIVE
HEP B S AG: NEGATIVE
Hep A IgM: NEGATIVE

## 2017-04-28 LAB — MAGNESIUM: Magnesium: 2 mg/dL (ref 1.7–2.4)

## 2017-04-28 MED ORDER — METHYLPREDNISOLONE SODIUM SUCC 125 MG IJ SOLR
60.0000 mg | Freq: Two times a day (BID) | INTRAMUSCULAR | Status: DC
  Administered 2017-04-28 – 2017-05-02 (×8): 60 mg via INTRAVENOUS
  Filled 2017-04-28 (×8): qty 2

## 2017-04-28 MED ORDER — DM-GUAIFENESIN ER 30-600 MG PO TB12
1.0000 | ORAL_TABLET | Freq: Two times a day (BID) | ORAL | Status: DC
  Administered 2017-04-28 – 2017-05-02 (×9): 1 via ORAL
  Filled 2017-04-28 (×9): qty 1

## 2017-04-28 NOTE — Progress Notes (Signed)
Pt had some uncontrolled cough.  Eventually calmed down after prn med and po fluid.  Idolina Primer, RN

## 2017-04-28 NOTE — Plan of Care (Signed)
  Clinical Measurements: Respiratory complications will improve. Pt 02 WNL. Pt c/o sob when ambulating to bathroom. Pt is Anxious at times. Ativan given 1x this shift for score of 8. She is now calm and appears relaxed at this time. Will continue to monitor and assess this shift.  04/28/2017 0549 - Progressing by Mellissa Kohut, RN

## 2017-04-28 NOTE — Progress Notes (Signed)
PROGRESS NOTE    Michelle [name]  QVZ:[id number] DOB: 1946/02/25 DOA: 04/24/2017 PCP: Fanny Bien, MD   Brief Narrative:  Michelle [name] is an 72 y.o. female past medical history significant for arthritis presents with complaints of because of wheezing and shortness of breath several days prior to admission. Chest x-ray on admission showed no evidence of pneumonia likely acute bronchitis. Respiratory Virus Panel positive. She was placed on CIWA Protocol because of concerns for EtOH Withdrawals.   Assessment & Plan:   Principal Problem:   Acute hypoxemic respiratory failure (HCC) Active Problems:   Obstructive sleep apnea   Essential hypertension   Morbid obesity (HCC)   Acute bronchitis   COPD exacerbation (HCC)   Abnormal LFTs   Thrombocytopenia (HCC)   ETOH abuse  Acute Respiratory Failure with Hypoxia 2/2 to Acute RSV Bronchitis -Initial CXR showed Mediastinum hilar structures normal. Volumes with mild basilar atelectasis. No pleural effusion or pneumothorax. Heart size normal.No acute bony abnormality. -Changed DuoNeb to Xopenex/Atrovent q6h; C/w DuoNeb q2hprn -IV Methylprednisolone 60 mg q12h changed to 60 mg q6h as patient was still wheezing yesterday; Wean as tolerated and decreased back to q12h today  -C/w Azithromycin started on Admission but change to po -C/w Supplemental O2 via [place] -Added Budesonide 0.25 mg Neb BID -C/w Benzonatate 100 mg po TIDprn Cough, Guaifenesin 1200 mg po BID, and Robitussin Liquid po q4hprn Cough -Added Flutter Valve and Incentive Spirometry  -C/w IV Azithromycin; Consider changing to Levofloxacin if not improving   -Influenza PCR is negative. -RSV was Positive in respiratory virus panel -Repeat CXR this AM showed There is bilateral chronic interstitial prominence. There is no focal parenchymal opacity. There is no pleural effusion or pneumothorax. The heart and mediastinal contours are unremarkable. The osseous structures are  unremarkable. -Will need to repeat Ambulatory Pulse Oximetry Screening prior to D/C as she still remains hypoxic while ambulating  -If continues to remain Hypoxic and not improving will consider obtaining a CTA of the Chest to r/o PE  Obstructive Sleep Apnea -C/w CPAP qHS   Essential Hypertension -BP was 151/95 -Continue current home regimen with Losartan 50 mg po Daily as blood pressure stable.  Morbid obesity ([place]) -Weight Loss Counseling given.  Elevated Lactic Acid, improved -Lactic Acid Level went 3.53 -> 1.7 -Likely due to Hypoxia unlikely sepsis.  Elevated BUN/Cr -Unknown Baseline but appears stable as BUN/Cr 32/1.08 yesterday and toady was 30/1.05 -D/C'd Gentle IVF Rehydration at 75 mL/hr and will likely D/C in AM  -Continue to Monitor and Repeat CMP in AM  Abnormal LFT's -AST went from 61 -> 56 -> 53 -> 63 -ALT went from 103 -> 94 -> 101 -> 136 -Checked Acute Hepatitis Panel and was Negative  -RUQ U/S showed no cholelithiasis or sonographic evidence of acute cholecystitis but did show increased echogenicity likely indicating hepatic steatosis -Patient also had a 2.7 x 2.3 x 2.8 cm anechoic left hepatic mass with a single thin internal septation most consistent with a mildly complicated cyst unchanged compared with prior CT abdomen dated 03/05/2016 -Repeat CMP in AM  Thrombocytopenia, improving -Patient's Platelet Count now 159 -Unclear Etiology but suspect related to EtOH -Continue to Monitor and Repeat CBC in AM  EtOH Abuse and Concern for Withdrawals -Patient admittedly drinks 1-2 glasses of Wine nightly -Started Med Surge CIWA Protocol  -Check RUQ U/S -C/w Folic Acid, MVI, Thiamine   Hyponatremia -Patient's Na+ was 134 -D/C'd IVF Rehydration with NS at 75 mL/hr -Continue to Monitor and repeat  CMP in AM   DVT prophylaxis: Enoxaparin 40 mg sq q24h Code Status: FULL CODE Family Communication: No family present at bedside  Disposition Plan: Anticipated  D/C Home within the next 24-48 hours  Consultants:   None   Procedures: None  Antimicrobials:  Anti-infectives (From admission, onward)   Start     Dose/Rate Route Frequency Ordered Stop   04/27/17 1000  azithromycin (ZITHROMAX) tablet 250 mg  Status:  Discontinued     250 mg Oral Daily 04/26/17 1401 04/26/17 1403   04/27/17 1000  azithromycin (ZITHROMAX) 250 mg in dextrose 5 % 125 mL IVPB     250 mg 125 mL/hr over 60 Minutes Intravenous Every 24 hours 04/26/17 1403     04/25/17 1000  azithromycin (ZITHROMAX) 250 mg in dextrose 5 % 125 mL IVPB  Status:  Discontinued     250 mg 125 mL/hr over 60 Minutes Intravenous Every 24 hours 04/24/17 1503 04/26/17 1401   04/24/17 1200  azithromycin (ZITHROMAX) 500 mg in dextrose 5 % 250 mL IVPB     500 mg 250 mL/hr over 60 Minutes Intravenous  Once 04/24/17 1151 04/24/17 1340     Subjective: Seen and examined and stated she was still coughing. Thinks wheezing has been improving. Admitted to having pain on Left side and thinks its from coughing. No other concerns or complaints at this time.   Objective: Vitals:   04/28/17 1334 04/28/17 1439 04/28/17 1506 04/28/17 1719  BP: 131/77  137/82 (!) 151/95  Pulse: 77  76 76  Resp: 16  16   Temp: 98.5 F (36.9 C)  97.9 F (36.6 C)   TempSrc: Oral  Oral   SpO2: 92% 94% 93% (!) 89%  Weight:        Intake/Output Summary (Last 24 hours) at 04/28/2017 1958 Last data filed at 04/28/2017 1700 Gross per 24 hour  Intake 720 ml  Output -  Net 720 ml   Filed Weights   04/24/17 2030 04/27/17 0550 04/28/17 0419  Weight: 107.2 kg (236 lb 5.3 oz) 105.8 kg (233 lb 4.8 oz) 106.2 kg (234 lb 1.6 oz)   Examination: Physical Exam:  Constitutional: WN/WD obese Caucasian female in NAD; appears anxious  Eyes: Sclerae anicteric. Lids normal ENMT: External Ears and nose appear normal Neck: Appears supple with no JVD Respiratory: Diminished with coarse breath sounds and mild expiratory wheezing. No accessory  muscles to breathe but is wearing supplemental O2 via [place] Cardiovascular: RRR; No appreciable m/r/g. No appreciable extremity edema Abdomen: Soft, NT, ND. Bowel sounds present GU: Deferred Musculoskeletal: No contractures; No cyanosis Skin: Warm and dry. No rashes or lesions on a limited skin eval Neurologic: CN 2-12 grossly intact. No appreciable focal deficits Psychiatric: Normal mood and affect. Intact judgement and insight  Data Reviewed: I have personally reviewed following labs and imaging studies  CBC: Recent Labs  Lab 04/24/17 1100 04/24/17 1809 04/25/17 0438 04/26/17 0325 04/27/17 0346 04/28/17 0929  WBC 6.7 5.0 6.1 7.3 8.1 8.7  NEUTROABS 5.4  --   --   --  5.1 6.6  HGB 14.0 12.8 13.0 13.2 12.8 14.1  HCT 41.3 39.4 39.4 39.5 38.9 41.3  MCV 100.2* 101.0* 100.5* 100.5* 100.3* 99.8  PLT 152 134* 147* 147* 147* 124   Basic Metabolic Panel: Recent Labs  Lab 04/24/17 1100 04/24/17 1809 04/25/17 0438 04/26/17 0325 04/27/17 0346 04/28/17 0929  NA 136  --  138 138 134* 134*  K 3.9  --  4.2 4.2 3.9 4.3  CL 104  --  105 104 102 104  CO2 19*  --  22 24 23  19*  GLUCOSE 152*  --  144* 155* 157* 218*  BUN 24*  --  21* 25* 32* 30*  CREATININE 1.18* 1.14* 0.94 1.02* 1.08* 1.05*  CALCIUM 9.1  --  8.8* 8.9 8.6* 8.5*  MG  --   --   --   --  2.0 2.0  PHOS  --   --   --   --   --  4.2   GFR: Estimated Creatinine Clearance: 57.9 mL/min (A) (by C-G formula based on SCr of 1.05 mg/dL (H)). Liver Function Tests: Recent Labs  Lab 04/24/17 1100 04/25/17 0438 04/27/17 0346 04/28/17 0929  AST 61* 56* 53* 63*  ALT 103* 94* 101* 136*  ALKPHOS 73 69 62 60  BILITOT 1.0 0.7 0.7 0.9  PROT 7.2 6.5 6.7 6.8  ALBUMIN 4.0 3.6 3.5 3.5   No results for input(s): LIPASE, AMYLASE in the last 168 hours. No results for input(s): AMMONIA in the last 168 hours. Coagulation Profile: No results for input(s): INR, PROTIME in the last 168 hours. Cardiac Enzymes: No results for input(s):  CKTOTAL, CKMB, CKMBINDEX, TROPONINI in the last 168 hours. BNP (last 3 results) No results for input(s): PROBNP in the last 8760 hours. HbA1C: No results for input(s): HGBA1C in the last 72 hours. CBG: No results for input(s): GLUCAP in the last 168 hours. Lipid Profile: No results for input(s): CHOL, HDL, LDLCALC, TRIG, CHOLHDL, LDLDIRECT in the last 72 hours. Thyroid Function Tests: No results for input(s): TSH, T4TOTAL, FREET4, T3FREE, THYROIDAB in the last 72 hours. Anemia Panel: No results for input(s): VITAMINB12, FOLATE, FERRITIN, TIBC, IRON, RETICCTPCT in the last 72 hours. Sepsis Labs: Recent Labs  Lab 04/24/17 1115 04/25/17 0442  LATICACIDVEN 3.53* 1.7   Recent Results (from the past 240 hour(s))  Blood culture (routine x 2)     Status: None (Preliminary result)   Collection Time: 04/24/17 12:26 PM  Result Value Ref Range Status   Specimen Description BLOOD RIGHT ANTECUBITAL  Final   Special Requests   Final    BOTTLES DRAWN AEROBIC AND ANAEROBIC Blood Culture adequate volume   Culture NO GROWTH 4 DAYS  Final   Report Status PENDING  Incomplete  Blood culture (routine x 2)     Status: None (Preliminary result)   Collection Time: 04/24/17 12:35 PM  Result Value Ref Range Status   Specimen Description BLOOD RIGHT HAND  Final   Special Requests   Final    BOTTLES DRAWN AEROBIC AND ANAEROBIC Blood Culture adequate volume   Culture NO GROWTH 4 DAYS  Final   Report Status PENDING  Incomplete  Respiratory Panel by PCR     Status: Abnormal   Collection Time: 04/24/17  3:04 PM  Result Value Ref Range Status   Adenovirus NOT DETECTED NOT DETECTED Final   Coronavirus 229E NOT DETECTED NOT DETECTED Final   Coronavirus HKU1 NOT DETECTED NOT DETECTED Final   Coronavirus NL63 NOT DETECTED NOT DETECTED Final   Coronavirus OC43 NOT DETECTED NOT DETECTED Final   Metapneumovirus NOT DETECTED NOT DETECTED Final   Rhinovirus / Enterovirus NOT DETECTED NOT DETECTED Final    Influenza A NOT DETECTED NOT DETECTED Final   Influenza B NOT DETECTED NOT DETECTED Final   Parainfluenza Virus 1 NOT DETECTED NOT DETECTED Final   Parainfluenza Virus 2 NOT DETECTED NOT DETECTED Final   Parainfluenza Virus 3 NOT DETECTED NOT DETECTED  Final   Parainfluenza Virus 4 NOT DETECTED NOT DETECTED Final   Respiratory Syncytial Virus DETECTED (A) NOT DETECTED Final    Comment: CRITICAL RESULT CALLED TO, READ BACK BY AND VERIFIED WITH: Deeann Saint [id number] 04/25/2017 T. TYSOR    Bordetella pertussis NOT DETECTED NOT DETECTED Final   Chlamydophila pneumoniae NOT DETECTED NOT DETECTED Final   Mycoplasma pneumoniae NOT DETECTED NOT DETECTED Final    Radiology Studies: Dg Chest 2 View  Result Date: 04/27/2017 CLINICAL DATA:  Shortness of breath, dry cough, congestion for 3 days EXAM: CHEST  2 VIEW COMPARISON:  04/24/2017 FINDINGS: There is bilateral chronic interstitial prominence. There is no focal parenchymal opacity. There is no pleural effusion or pneumothorax. The heart and mediastinal contours are unremarkable. The osseous structures are unremarkable. IMPRESSION: No active cardiopulmonary disease. Electronically Signed   By: Kathreen Devoid   On: 04/27/2017 12:50   US Abdomen Limited Ruq  Result Date: 04/27/2017 CLINICAL DATA:  Abnormal LFTs EXAM: ULTRASOUND ABDOMEN LIMITED RIGHT UPPER QUADRANT COMPARISON:  CT abdomen 03/05/2016 FINDINGS: Gallbladder: No gallstones or wall thickening visualized. No sonographic [place] sign noted by sonographer. Common bile duct: Diameter: 3.7 mm Liver: 2.7 x 2.3 x 2.8 cm anechoic left hepatic mass with a single thin internal septation most consistent with a mildly complicated cyst unchanged compared with prior CT abdomen dated 03/05/2016. Increased hepatic parenchymal echogenicity. Portal vein is patent on color Doppler imaging with normal direction of blood flow towards the liver. IMPRESSION: 1. No cholelithiasis or sonographic evidence of acute cholecystitis.  2. Increased hepatic echogenicity as can be seen with hepatic steatosis. Electronically Signed   By: Kathreen Devoid   On: 04/27/2017 15:19    Scheduled Meds: . budesonide (PULMICORT) nebulizer solution  0.25 mg Nebulization BID  . cholecalciferol  1,000 Units Oral Daily  . dextromethorphan-guaiFENesin  1 tablet Oral BID  . enoxaparin (LOVENOX) injection  40 mg Subcutaneous Q24H  . folic acid  1 mg Oral Daily  . ipratropium  0.5 mg Nebulization Q6H  . levalbuterol  0.63 mg Nebulization Q6H  . LORazepam  0-4 mg Intravenous Q6H   Followed by  . [START ON 04/29/2017] LORazepam  0-4 mg Intravenous Q12H  . losartan  50 mg Oral Daily  . methylPREDNISolone (SOLU-MEDROL) injection  60 mg Intravenous Q12H  . multivitamin with minerals  1 tablet Oral Daily  . sertraline  200 mg Oral Daily  . thiamine  100 mg Oral Daily   Or  . thiamine  100 mg Intravenous Daily   Continuous Infusions: . azithromycin Stopped (04/28/17 1117)    LOS: 4 days   Kerney Elbe, DO [facility] Pager [phone]  If 7PM-7AM, please contact night-coverage [email or web address] Password [facility] 04/28/2017, 7:58 PM

## 2017-04-29 ENCOUNTER — Inpatient Hospital Stay (HOSPITAL_COMMUNITY): Payer: Medicare Other

## 2017-04-29 DIAGNOSIS — I10 Essential (primary) hypertension: Secondary | ICD-10-CM

## 2017-04-29 DIAGNOSIS — R0609 Other forms of dyspnea: Secondary | ICD-10-CM

## 2017-04-29 LAB — COMPREHENSIVE METABOLIC PANEL
ALBUMIN: 3.1 g/dL — AB (ref 3.5–5.0)
ALK PHOS: 54 U/L (ref 38–126)
ALT: 118 U/L — ABNORMAL HIGH (ref 14–54)
AST: 48 U/L — AB (ref 15–41)
Anion gap: 18 — ABNORMAL HIGH (ref 5–15)
BUN: 32 mg/dL — AB (ref 6–20)
CALCIUM: 8.4 mg/dL — AB (ref 8.9–10.3)
CHLORIDE: 104 mmol/L (ref 101–111)
CO2: 13 mmol/L — AB (ref 22–32)
CREATININE: 1.12 mg/dL — AB (ref 0.44–1.00)
GFR calc Af Amer: 57 mL/min — ABNORMAL LOW (ref >60)
GFR calc non Af Amer: 49 mL/min — ABNORMAL LOW (ref >60)
GLUCOSE: 189 mg/dL — AB (ref 65–99)
Potassium: 4 mmol/L (ref 3.5–5.1)
SODIUM: 135 mmol/L (ref 135–145)
Total Bilirubin: 0.9 mg/dL (ref 0.3–1.2)
Total Protein: 5.8 g/dL — ABNORMAL LOW (ref 6.5–8.1)

## 2017-04-29 LAB — CBC WITH DIFFERENTIAL/PLATELET
Basophils Absolute: 0 10*3/uL (ref 0.0–0.1)
Basophils Relative: 0 %
EOS ABS: 0 10*3/uL (ref 0.0–0.7)
Eosinophils Relative: 0 %
HCT: 38.7 % (ref 36.0–46.0)
HEMOGLOBIN: 12.8 g/dL (ref 12.0–15.0)
LYMPHS PCT: 20 %
Lymphs Abs: 1.6 10*3/uL (ref 0.7–4.0)
MCH: 32.9 pg (ref 26.0–34.0)
MCHC: 33.1 g/dL (ref 30.0–36.0)
MCV: 99.5 fL (ref 78.0–100.0)
MONO ABS: 0.6 10*3/uL (ref 0.1–1.0)
Monocytes Relative: 8 %
NEUTROS PCT: 72 %
Neutro Abs: 5.9 10*3/uL (ref 1.7–7.7)
PLATELETS: 185 10*3/uL (ref 150–400)
RBC: 3.89 MIL/uL (ref 3.87–5.11)
RDW: 12.3 % (ref 11.5–15.5)
WBC: 8.1 10*3/uL (ref 4.0–10.5)

## 2017-04-29 LAB — ECHOCARDIOGRAM COMPLETE
HEIGHTINCHES: 60.75 in
WEIGHTICAEL: 3718.4 [oz_av]

## 2017-04-29 LAB — BLOOD GAS, ARTERIAL
ACID-BASE DEFICIT: 0.9 mmol/L (ref 0.0–2.0)
Bicarbonate: 22.3 mmol/L (ref 20.0–28.0)
DRAWN BY: 331761
O2 Content: 3 L/min
O2 Saturation: 98.6 %
PH ART: 7.471 — AB (ref 7.350–7.450)
Patient temperature: 98.6
pCO2 arterial: 31 mmHg — ABNORMAL LOW (ref 32.0–48.0)
pO2, Arterial: 125 mmHg — ABNORMAL HIGH (ref 83.0–108.0)

## 2017-04-29 LAB — CULTURE, BLOOD (ROUTINE X 2)
Culture: NO GROWTH
Culture: NO GROWTH
SPECIAL REQUESTS: ADEQUATE
SPECIAL REQUESTS: ADEQUATE

## 2017-04-29 LAB — LACTIC ACID, PLASMA
LACTIC ACID, VENOUS: 2.8 mmol/L — AB (ref 0.5–1.9)
Lactic Acid, Venous: 2.1 mmol/L (ref 0.5–1.9)

## 2017-04-29 LAB — MAGNESIUM: Magnesium: 2 mg/dL (ref 1.7–2.4)

## 2017-04-29 LAB — PHOSPHORUS: PHOSPHORUS: 5.3 mg/dL — AB (ref 2.5–4.6)

## 2017-04-29 LAB — HEPATITIS PANEL, ACUTE
HEP A IGM: NEGATIVE
Hep B C IgM: NEGATIVE
Hepatitis B Surface Ag: NEGATIVE

## 2017-04-29 MED ORDER — LEVALBUTEROL HCL 0.63 MG/3ML IN NEBU
0.6300 mg | INHALATION_SOLUTION | Freq: Two times a day (BID) | RESPIRATORY_TRACT | Status: DC
  Administered 2017-04-30 – 2017-05-02 (×5): 0.63 mg via RESPIRATORY_TRACT
  Filled 2017-04-29 (×5): qty 3

## 2017-04-29 MED ORDER — IOPAMIDOL (ISOVUE-370) INJECTION 76%
INTRAVENOUS | Status: AC
  Administered 2017-04-29: 76 mL
  Filled 2017-04-29: qty 100

## 2017-04-29 MED ORDER — SODIUM CHLORIDE 0.9 % IV BOLUS (SEPSIS)
500.0000 mL | Freq: Once | INTRAVENOUS | Status: AC
  Administered 2017-04-29: 500 mL via INTRAVENOUS

## 2017-04-29 MED ORDER — SODIUM CHLORIDE 0.9 % IV SOLN
INTRAVENOUS | Status: DC
  Administered 2017-04-30 – 2017-05-01 (×3): via INTRAVENOUS

## 2017-04-29 MED ORDER — IPRATROPIUM BROMIDE 0.02 % IN SOLN
0.5000 mg | Freq: Two times a day (BID) | RESPIRATORY_TRACT | Status: DC
Start: 2017-04-30 — End: 2017-05-02
  Administered 2017-04-30 – 2017-05-02 (×5): 0.5 mg via RESPIRATORY_TRACT
  Filled 2017-04-29 (×5): qty 2.5

## 2017-04-29 NOTE — Progress Notes (Signed)
PROGRESS NOTE    Michelle [name]  TFT:[id number] DOB: [birth year] DOA: 04/24/2017 PCP: Fanny Bien, MD   Brief Narrative:  Michelle [name] is an 72 y.o. female past medical history significant for arthritis presents with complaints of because of wheezing and shortness of breath several days prior to admission. Chest x-ray on admission showed no evidence of pneumonia likely acute bronchitis. Respiratory Virus Panel positive. She was placed on CIWA Protocol because of concerns for EtOH Withdrawals.   Assessment & Plan:   Principal Problem:   Acute hypoxemic respiratory failure (HCC) Active Problems:   Obstructive sleep apnea   Essential hypertension   Morbid obesity (HCC)   Acute bronchitis   COPD exacerbation (HCC)   Abnormal LFTs   Thrombocytopenia (HCC)   ETOH abuse  Acute Respiratory Failure with Hypoxia 2/2 to Acute RSV Bronchitis; continues to remain Hypoxic  -Initial CXR showed Mediastinum hilar structures normal. Volumes with mild basilar atelectasis. No pleural effusion or pneumothorax. Heart size normal.No acute bony abnormality. -Changed DuoNeb to Xopenex/Atrovent q6h; C/w DuoNeb q2hprn -IV Methylprednisolone 60 mg q12h changed to 60 mg q6h as patient was still wheezing yesterday; Wean as tolerated and decreased back to q12h yesterday  -C/w Azithromycin started on Admission but change to po -C/w Supplemental O2 via [place] -Added Budesonide 0.25 mg Neb BID -C/w Benzonatate 100 mg po TIDprn Cough, Guaifenesin 1200 mg po BID, and Robitussin Liquid po q4hprn Cough -Added Flutter Valve and Incentive Spirometry  -C/w IV Azithromycin; Consider changing to Levofloxacin if not improving   -Influenza PCR is negative. -RSV was Positive in respiratory virus panel -Ambulatory Pulse Oximetry Screening prior to D/C as she still remains hypoxic while ambulating today  -Obtained CTA of the Chest to r/o PE No pulmonary emboli. Mild aortic atherosclerosis. Some coronary artery  calcification. Normal heart size. Mild dependent pulmonary atelectasis. -ECHO done to evaluate for Pulmonary HTN and showed EF of 55-60% with LVDF parameters being normal   Anion Gap Metabolic Acidosis -Unclear Etiology, Do not suspect RTA and patient is not vomiting or not having any Diarrhea -Appears compensated from ABG (on 3 L) as was 7.471/31.0/125/22.3/98.6% -CO2 went from 24 -> 13 -Lactic Acidosis increased -Will check an LDH -Started IVF and gave bolus of 500 mL  Elevated Lactic Acid/Lactic Acidosis -Lactic Acid Level went 3.53 -> 1.7 and had improved -Likely due to Hypoxia unlikely sepsis -Today Patient's LA went from 2.1 -> 2.8 -Repeat in AM   Obstructive Sleep Apnea -C/w CPAP qHS   Essential Hypertension -BP was 142/82 -Continue current home regimen with Losartan 50 mg po Daily as blood pressure stable.  Morbid obesity ([place]) -Weight Loss Counseling given.  Elevated BUN/Cr -Unknown Baseline but appears stable as BUN/Cr 32/1.08 -> 30/1.05 -> 32/1.12 -Restarted IVF Rehydration with NS at 100 mL/hr -Continue to Monitor and Repeat CMP in AM  Abnormal LFT's, suspect from Hepatic Steatosis -AST went from 61 -> 56 -> 53 -> 63 -> 48  -ALT went from 103 -> 94 -> 101 -> 136 -> 118 -Checked Acute Hepatitis Panel and was Negative  -RUQ U/S showed no cholelithiasis or sonographic evidence of acute cholecystitis but did show increased echogenicity likely indicating hepatic steatosis -Patient also had a 2.7 x 2.3 x 2.8 cm anechoic left hepatic mass with a single thin internal septation most consistent with a mildly complicated cyst unchanged compared with prior CT abdomen dated 03/05/2016 -Repeat CMP in AM  Thrombocytopenia, improving -Patient's Platelet Count now 185 -Unclear Etiology but suspect related  to EtOH -Continue to Monitor and Repeat CBC in AM  EtOH Abuse and Concern for Withdrawals -Patient admittedly drinks 1-2 glasses of Wine nightly -Started Med Surge  CIWA Protocol  -Check RUQ U/S -C/w Folic Acid, MVI, Thiamine   Hyponatremia -Patient's Na+ was 135 -Restarted IVF Rehydration with NS at 100 mL/hr -Continue to Monitor and repeat CMP in AM   DVT prophylaxis: Enoxaparin 40 mg sq q24h Code Status: FULL CODE Family Communication: Discussed with Daughter over the phone Disposition Plan: Anticipated D/C Home within the next 24-48 hours  Consultants:   None   Procedures: ECHOCARDIOGRAM ------------------------------------------------------------------- Study Conclusions  - Left ventricle: The cavity size was normal. There was moderate   concentric hypertrophy. Systolic function was normal. The   estimated ejection fraction was in the range of 55% to 60%. Wall   motion was normal; there were no regional wall motion   abnormalities. Left ventricular diastolic function parameters   were normal. - Aortic valve: AV poorly visualized some calcification/thickening   There is a small gradient across valve suggesting mild AS   no sub valvular turbulence seen Valve area (VTI): 1.63 cm^2.   Valve area (Vmax): 1.59 cm^2. Valve area (Vmean): 1.46 cm^2. - Atrial septum: No defect or patent foramen ovale was identified.  Antimicrobials:  Anti-infectives (From admission, onward)   Start     Dose/Rate Route Frequency Ordered Stop   04/27/17 1000  azithromycin (ZITHROMAX) tablet 250 mg  Status:  Discontinued     250 mg Oral Daily 04/26/17 1401 04/26/17 1403   04/27/17 1000  azithromycin (ZITHROMAX) 250 mg in dextrose 5 % 125 mL IVPB     250 mg 125 mL/hr over 60 Minutes Intravenous Every 24 hours 04/26/17 1403     04/25/17 1000  azithromycin (ZITHROMAX) 250 mg in dextrose 5 % 125 mL IVPB  Status:  Discontinued     250 mg 125 mL/hr over 60 Minutes Intravenous Every 24 hours 04/24/17 1503 04/26/17 1401   04/24/17 1200  azithromycin (ZITHROMAX) 500 mg in dextrose 5 % 250 mL IVPB     500 mg 250 mL/hr over 60 Minutes Intravenous  Once 04/24/17  1151 04/24/17 1340     Subjective: Seen and examined and stated she still felt SOB. Patient's Left side was hurting from coughing. No nausea or vomiting.    Objective: Vitals:   04/29/17 1419 04/29/17 1700 04/29/17 2101 04/29/17 2107  BP:  127/78 (!) 142/82   Pulse:  74 82   Resp:  20 20   Temp:  97.9 F (36.6 C) 98.6 F (37 C)   TempSrc:  Oral Axillary   SpO2: 95% 94% 90% 93%  Weight:      Height:        Intake/Output Summary (Last 24 hours) at 04/29/2017 2127 Last data filed at 04/29/2017 1844 Gross per 24 hour  Intake 840 ml  Output -  Net 840 ml   Filed Weights   04/27/17 0550 04/28/17 0419 04/29/17 0500  Weight: 105.8 kg (233 lb 4.8 oz) 106.2 kg (234 lb 1.6 oz) 105.4 kg (232 lb 6.4 oz)   Examination: Physical Exam:  Constitutional: WN/WD obese Caucasian female in NAD but appears anxious Eyes: Sclerae anicteric.  ENMT: External Ears and nose appear normal. MMM Neck: Appears supple with no JVD Respiratory: Diminished with coarse breath sounds and some expiratory wheezing. Wearing supplemental O2 via [place] Cardiovascular: RRR; No appreciable m/r/g. No appreciable extremity edema Abdomen: Soft, NT, Distended due to body habitus. Bowel sounds  present GU: Deferred Musculoskeletal: No contractures; No cyanosis Skin: Warm and Dry; No appreciable rashes or lesions Neurologic: CN 2-12 grossly intact. No appreciable focal deficits Psychiatric: Normal mood and affect. Intact judgement and insight  Data Reviewed: I have personally reviewed following labs and imaging studies  CBC: Recent Labs  Lab 04/24/17 1100  04/25/17 0438 04/26/17 0325 04/27/17 0346 04/28/17 0929 04/29/17 0555  WBC 6.7   < > 6.1 7.3 8.1 8.7 8.1  NEUTROABS 5.4  --   --   --  5.1 6.6 5.9  HGB 14.0   < > 13.0 13.2 12.8 14.1 12.8  HCT 41.3   < > 39.4 39.5 38.9 41.3 38.7  MCV 100.2*   < > 100.5* 100.5* 100.3* 99.8 99.5  PLT 152   < > 147* 147* 147* 159 185   < > = values in this interval not displayed.    Basic Metabolic Panel: Recent Labs  Lab 04/25/17 0438 04/26/17 0325 04/27/17 0346 04/28/17 0929 04/29/17 0555  NA 138 138 134* 134* 135  K 4.2 4.2 3.9 4.3 4.0  CL 105 104 102 104 104  CO2 22 24 23  19* 13*  GLUCOSE 144* 155* 157* 218* 189*  BUN 21* 25* 32* 30* 32*  CREATININE 0.94 1.02* 1.08* 1.05* 1.12*  CALCIUM 8.8* 8.9 8.6* 8.5* 8.4*  MG  --   --  2.0 2.0 2.0  PHOS  --   --   --  4.2 5.3*   GFR: Estimated Creatinine Clearance: 52.8 mL/min (A) (by C-G formula based on SCr of 1.12 mg/dL (H)). Liver Function Tests: Recent Labs  Lab 04/24/17 1100 04/25/17 0438 04/27/17 0346 04/28/17 0929 04/29/17 0555  AST 61* 56* 53* 63* 48*  ALT 103* 94* 101* 136* 118*  ALKPHOS 73 69 62 60 54  BILITOT 1.0 0.7 0.7 0.9 0.9  PROT 7.2 6.5 6.7 6.8 5.8*  ALBUMIN 4.0 3.6 3.5 3.5 3.1*   No results for input(s): LIPASE, AMYLASE in the last 168 hours. No results for input(s): AMMONIA in the last 168 hours. Coagulation Profile: No results for input(s): INR, PROTIME in the last 168 hours. Cardiac Enzymes: No results for input(s): CKTOTAL, CKMB, CKMBINDEX, TROPONINI in the last 168 hours. BNP (last 3 results) No results for input(s): PROBNP in the last 8760 hours. HbA1C: No results for input(s): HGBA1C in the last 72 hours. CBG: No results for input(s): GLUCAP in the last 168 hours. Lipid Profile: No results for input(s): CHOL, HDL, LDLCALC, TRIG, CHOLHDL, LDLDIRECT in the last 72 hours. Thyroid Function Tests: No results for input(s): TSH, T4TOTAL, FREET4, T3FREE, THYROIDAB in the last 72 hours. Anemia Panel: No results for input(s): VITAMINB12, FOLATE, FERRITIN, TIBC, IRON, RETICCTPCT in the last 72 hours. Sepsis Labs: Recent Labs  Lab 04/24/17 1115 04/25/17 0442 04/29/17 0930 04/29/17 1324  LATICACIDVEN 3.53* 1.7 2.1* 2.8*   Recent Results (from the past 240 hour(s))  Blood culture (routine x 2)     Status: None   Collection Time: 04/24/17 12:26 PM  Result Value Ref Range  Status   Specimen Description BLOOD RIGHT ANTECUBITAL  Final   Special Requests   Final    BOTTLES DRAWN AEROBIC AND ANAEROBIC Blood Culture adequate volume   Culture NO GROWTH 5 DAYS  Final   Report Status 04/29/2017 FINAL  Final  Blood culture (routine x 2)     Status: None   Collection Time: 04/24/17 12:35 PM  Result Value Ref Range Status   Specimen Description BLOOD RIGHT HAND  Final   Special Requests   Final    BOTTLES DRAWN AEROBIC AND ANAEROBIC Blood Culture adequate volume   Culture NO GROWTH 5 DAYS  Final   Report Status 04/29/2017 FINAL  Final  Respiratory Panel by PCR     Status: Abnormal   Collection Time: 04/24/17  3:04 PM  Result Value Ref Range Status   Adenovirus NOT DETECTED NOT DETECTED Final   Coronavirus 229E NOT DETECTED NOT DETECTED Final   Coronavirus HKU1 NOT DETECTED NOT DETECTED Final   Coronavirus NL63 NOT DETECTED NOT DETECTED Final   Coronavirus OC43 NOT DETECTED NOT DETECTED Final   Metapneumovirus NOT DETECTED NOT DETECTED Final   Rhinovirus / Enterovirus NOT DETECTED NOT DETECTED Final   Influenza A NOT DETECTED NOT DETECTED Final   Influenza B NOT DETECTED NOT DETECTED Final   Parainfluenza Virus 1 NOT DETECTED NOT DETECTED Final   Parainfluenza Virus 2 NOT DETECTED NOT DETECTED Final   Parainfluenza Virus 3 NOT DETECTED NOT DETECTED Final   Parainfluenza Virus 4 NOT DETECTED NOT DETECTED Final   Respiratory Syncytial Virus DETECTED (A) NOT DETECTED Final    Comment: CRITICAL RESULT CALLED TO, READ BACK BY AND VERIFIED WITH: Deeann Saint 2703 04/25/2017 T. TYSOR    Bordetella pertussis NOT DETECTED NOT DETECTED Final   Chlamydophila pneumoniae NOT DETECTED NOT DETECTED Final   Mycoplasma pneumoniae NOT DETECTED NOT DETECTED Final    Radiology Studies: Dg Chest 2 View  Result Date: 04/29/2017 CLINICAL DATA:  Short of breath EXAM: CHEST  2 VIEW COMPARISON:  04/27/2017 FINDINGS: The heart size and mediastinal contours are within normal limits.  Both lungs are clear. The visualized skeletal structures are unremarkable. IMPRESSION: No active cardiopulmonary disease. Electronically Signed   By: Franchot Gallo M.D.   On: 04/29/2017 08:05   Ct Angio Chest Pe W Or Wo Contrast  Result Date: 04/29/2017 CLINICAL DATA:  Acute presentation with wheezing and shortness of breath. EXAM: CT ANGIOGRAPHY CHEST WITH CONTRAST TECHNIQUE: Multidetector CT imaging of the chest was performed using the standard protocol during bolus administration of intravenous contrast. Multiplanar CT image reconstructions and MIPs were obtained to evaluate the vascular anatomy. CONTRAST:  5mL ISOVUE-370 IOPAMIDOL (ISOVUE-370) INJECTION 76% COMPARISON:  Radiography same day, 04/27/2017 and 04/24/2017. FINDINGS: Cardiovascular: Pulmonary arterial opacification is good. There are no pulmonary emboli. Mild aortic atherosclerosis. No aneurysm or dissection. Coronary artery calcification. Heart size is normal. Chamber size is normal. No pericardial fluid. Mediastinum/Nodes: No adenopathy or mass. Lungs/Pleura: Mild dependent atelectasis. No consolidation or lobar collapse. No focal pulmonary lesion. Upper Abdomen: Negative Musculoskeletal: Ordinary thoracic degenerative changes. Review of the MIP images confirms the above findings. IMPRESSION: No pulmonary emboli. Mild aortic atherosclerosis. Some coronary artery calcification. Normal heart size. Mild dependent pulmonary atelectasis. Electronically Signed   By: Nelson Chimes M.D.   On: 04/29/2017 15:17    Scheduled Meds: . budesonide (PULMICORT) nebulizer solution  0.25 mg Nebulization BID  . cholecalciferol  1,000 Units Oral Daily  . dextromethorphan-guaiFENesin  1 tablet Oral BID  . enoxaparin (LOVENOX) injection  40 mg Subcutaneous Q24H  . folic acid  1 mg Oral Daily  . ipratropium  0.5 mg Nebulization Q6H  . levalbuterol  0.63 mg Nebulization Q6H  . LORazepam  0-4 mg Intravenous Q12H  . losartan  50 mg Oral Daily  .  methylPREDNISolone (SOLU-MEDROL) injection  60 mg Intravenous Q12H  . multivitamin with minerals  1 tablet Oral Daily  . sertraline  200 mg Oral Daily  .  thiamine  100 mg Oral Daily   Or  . thiamine  100 mg Intravenous Daily   Continuous Infusions: . sodium chloride 100 mL/hr at 04/29/17 1710  . azithromycin Stopped (04/29/17 1011)    LOS: 5 days   Kerney Elbe, DO [facility] Pager [phone]  If 7PM-7AM, please contact night-coverage [email or web address] Password [facility] 04/29/2017, 9:27 PM

## 2017-04-29 NOTE — Progress Notes (Signed)
Inpatient Diabetes Program Recommendations  AACE/ADA: New Consensus Statement on Inpatient Glycemic Control (2015)  Target Ranges:  Prepandial:   less than 140 mg/dL      Peak postprandial:   less than 180 mg/dL (1-2 hours)      Critically ill patients:  140 - 180 mg/dL   No results found for: GLUCAP, HGBA1C  Review of Glycemic Control  Results for DHRUVI, CRENSHAW (MRN [id number]) as of 04/29/2017 10:39  Ref. Range 04/27/2017 03:46 04/28/2017 09:29 04/29/2017 05:55  Glucose Latest Ref Range: 65 - 99 mg/dL 157 (H) 218 (H) 189 (H)   Inpatient Diabetes Program Recommendations:   Noted patient on steroids with elevated CBGs. Please consider Glycemic control order set with sensitive Novolog correction tid  Thank you, Nani Gasser. Aamir Mclinden, RN, MSN, CDE  Diabetes Coordinator Inpatient Glycemic Control Team Team Pager [phone] (8am-5pm) 04/29/2017 10:47 AM

## 2017-04-29 NOTE — Progress Notes (Signed)
Ambulated pt couple of times today without O2. Pt O2 sat dropped to 88% just from bed to BR both times.  Pt is currently 96% on 2L via [place].  Idolina Primer, RN

## 2017-04-29 NOTE — Progress Notes (Signed)
  Echocardiogram 2D Echocardiogram has been performed.  Jannett Celestine 04/29/2017, 2:11 PM

## 2017-04-30 LAB — CBC WITH DIFFERENTIAL/PLATELET
Basophils Absolute: 0 10*3/uL (ref 0.0–0.1)
Basophils Relative: 0 %
EOS ABS: 0 10*3/uL (ref 0.0–0.7)
Eosinophils Relative: 0 %
HCT: 40.5 % (ref 36.0–46.0)
HEMOGLOBIN: 13.4 g/dL (ref 12.0–15.0)
LYMPHS ABS: 1.2 10*3/uL (ref 0.7–4.0)
LYMPHS PCT: 17 %
MCH: 33 pg (ref 26.0–34.0)
MCHC: 33.1 g/dL (ref 30.0–36.0)
MCV: 99.8 fL (ref 78.0–100.0)
Monocytes Absolute: 0.6 10*3/uL (ref 0.1–1.0)
Monocytes Relative: 8 %
Neutro Abs: 5.1 10*3/uL (ref 1.7–7.7)
Neutrophils Relative %: 75 %
Platelets: 180 10*3/uL (ref 150–400)
RBC: 4.06 MIL/uL (ref 3.87–5.11)
RDW: 12.3 % (ref 11.5–15.5)
WBC: 6.9 10*3/uL (ref 4.0–10.5)

## 2017-04-30 LAB — HIV ANTIBODY (ROUTINE TESTING W REFLEX): HIV Screen 4th Generation wRfx: NONREACTIVE

## 2017-04-30 LAB — COMPREHENSIVE METABOLIC PANEL
ALK PHOS: 60 U/L (ref 38–126)
ALT: 116 U/L — AB (ref 14–54)
AST: 38 U/L (ref 15–41)
Albumin: 3.2 g/dL — ABNORMAL LOW (ref 3.5–5.0)
Anion gap: 8 (ref 5–15)
BUN: 26 mg/dL — AB (ref 6–20)
CALCIUM: 8.3 mg/dL — AB (ref 8.9–10.3)
CO2: 22 mmol/L (ref 22–32)
CREATININE: 0.99 mg/dL (ref 0.44–1.00)
Chloride: 107 mmol/L (ref 101–111)
GFR calc non Af Amer: 57 mL/min — ABNORMAL LOW (ref >60)
GLUCOSE: 195 mg/dL — AB (ref 65–99)
Potassium: 4.4 mmol/L (ref 3.5–5.1)
SODIUM: 137 mmol/L (ref 135–145)
Total Bilirubin: 0.8 mg/dL (ref 0.3–1.2)
Total Protein: 6.1 g/dL — ABNORMAL LOW (ref 6.5–8.1)

## 2017-04-30 LAB — PHOSPHORUS: PHOSPHORUS: 4.4 mg/dL (ref 2.5–4.6)

## 2017-04-30 LAB — MAGNESIUM: Magnesium: 2.1 mg/dL (ref 1.7–2.4)

## 2017-04-30 MED ORDER — AZITHROMYCIN 250 MG PO TABS: 250.0000 mg | ORAL_TABLET | Freq: Every day | ORAL | Status: DC

## 2017-04-30 MED ORDER — GUAIFENESIN-CODEINE 100-10 MG/5ML PO SOLN
5.0000 mL | ORAL | Status: DC | PRN
  Administered 2017-04-30 – 2017-05-01 (×4): 5 mL via ORAL
  Filled 2017-04-30 (×4): qty 5

## 2017-04-30 NOTE — Progress Notes (Signed)
PROGRESS NOTE    Michelle [name]  CWU:[id number] DOB: 12-11-1945 DOA: 04/24/2017 PCP: Fanny Bien, MD   Brief Narrative: 72 year old female with history of obstructive sleep apnea, hypertension, morbid obesity, presented with shortness of breath for 2 days.  Chest x-ray with likely acute bronchitis.  Respiratory virus positive.  Assessment & Plan:  #Acute respiratory failure with hypoxia due to acute RSV bronchitis: Patient remains hypoxic, coughing and short of breath.  Plan to continue Solu-Medrol, bronchodilators, incentive spirometer.  On azithromycin.  Try to wean down oxygen today.  Continue cough medication.  Overall feeling better but not at baseline.  CT scan negative for PE.  Echo with pulmonary hypertension and EF of 55-60%. -Added codeine for the cough management.  #Lactic acidosis: IV fluid.  Repeat lab in the morning.  Bicarb improved.  Blood culture negative.  #Obstructive sleep apnea continue CPAP at night.  #Essential hypertension: Continue home medication.  Monitor blood pressure.  #Morbid obesity.  #Mild transaminitis likely due to hepatic steatosis: Ultrasound of abdomen showed hepatic steatosis.  Recommend outpatient follow-up.  #Thrombocytopenia: Improved  #Alcohol abuse and concern for withdrawal: Continue CIWA protocol.  Continue vitamins and supportive care.  No sign of withdrawal today.  DVT prophylaxis: Lovenox subcutaneous Code Status: Full code  family Communication: No family at bedside Disposition Plan: PT OT evaluation and case manager referral.  Likely discharge home in 1-2 days.    Consultants:   None  Procedures: None Antimicrobials: Azithromycin  Subjective: Seen and examined at bedside.  Reported coughing and requiring about 3 L of oxygen.  More short of breath.  No chest pain, nausea vomiting.  She reported she is gradually improving.  Not at baseline.  Objective: Vitals:   04/29/17 2200 04/30/17 0407 04/30/17 0930 04/30/17  1322  BP:  (!) 149/82  128/75  Pulse: 82 75  81  Resp:  18    Temp:  97.6 F (36.4 C)  98.5 F (36.9 C)  TempSrc:  Oral  Oral  SpO2: 96% 95% 96% 95%  Weight:  105 kg (231 lb 6.4 oz)    Height:        Intake/Output Summary (Last 24 hours) at 04/30/2017 1500 Last data filed at 04/30/2017 1326 Gross per 24 hour  Intake 2628.33 ml  Output -  Net 2628.33 ml   Filed Weights   04/28/17 0419 04/29/17 0500 04/30/17 0407  Weight: 106.2 kg (234 lb 1.6 oz) 105.4 kg (232 lb 6.4 oz) 105 kg (231 lb 6.4 oz)    Examination:  General exam: Appears calm and comfortable  Respiratory system: Lateral diffuse rhonchi with expiratory wheeze, respiratory effort normal.   Cardiovascular system: S1 & S2 heard, RRR.  No pedal edema. Gastrointestinal system: Abdomen is nondistended, soft and nontender. Normal bowel sounds heard. Central nervous system: Alert and oriented. No focal neurological deficits. Extremities: Symmetric 5 x 5 power. Skin: No rashes, lesions or ulcers Psychiatry: Judgement and insight appear normal. Mood & affect appropriate.     Data Reviewed: I have personally reviewed following labs and imaging studies  CBC: Recent Labs  Lab 04/24/17 1100  04/26/17 0325 04/27/17 0346 04/28/17 0929 04/29/17 0555 04/30/17 0341  WBC 6.7   < > 7.3 8.1 8.7 8.1 6.9  NEUTROABS 5.4  --   --  5.1 6.6 5.9 5.1  HGB 14.0   < > 13.2 12.8 14.1 12.8 13.4  HCT 41.3   < > 39.5 38.9 41.3 38.7 40.5  MCV 100.2*   < >  100.5* 100.3* 99.8 99.5 99.8  PLT 152   < > 147* 147* 159 185 180   < > = values in this interval not displayed.   Basic Metabolic Panel: Recent Labs  Lab 04/26/17 0325 04/27/17 0346 04/28/17 0929 04/29/17 0555 04/30/17 0341  NA 138 134* 134* 135 137  K 4.2 3.9 4.3 4.0 4.4  CL 104 102 104 104 107  CO2 24 23 19* 13* 22  GLUCOSE 155* 157* 218* 189* 195*  BUN 25* 32* 30* 32* 26*  CREATININE 1.02* 1.08* 1.05* 1.12* 0.99  CALCIUM 8.9 8.6* 8.5* 8.4* 8.3*  MG  --  2.0 2.0 2.0 2.1    PHOS  --   --  4.2 5.3* 4.4   GFR: Estimated Creatinine Clearance: 59.5 mL/min (by C-G formula based on SCr of 0.99 mg/dL). Liver Function Tests: Recent Labs  Lab 04/25/17 0438 04/27/17 0346 04/28/17 0929 04/29/17 0555 04/30/17 0341  AST 56* 53* 63* 48* 38  ALT 94* 101* 136* 118* 116*  ALKPHOS 69 62 60 54 60  BILITOT 0.7 0.7 0.9 0.9 0.8  PROT 6.5 6.7 6.8 5.8* 6.1*  ALBUMIN 3.6 3.5 3.5 3.1* 3.2*   No results for input(s): LIPASE, AMYLASE in the last 168 hours. No results for input(s): AMMONIA in the last 168 hours. Coagulation Profile: No results for input(s): INR, PROTIME in the last 168 hours. Cardiac Enzymes: No results for input(s): CKTOTAL, CKMB, CKMBINDEX, TROPONINI in the last 168 hours. BNP (last 3 results) No results for input(s): PROBNP in the last 8760 hours. HbA1C: No results for input(s): HGBA1C in the last 72 hours. CBG: No results for input(s): GLUCAP in the last 168 hours. Lipid Profile: No results for input(s): CHOL, HDL, LDLCALC, TRIG, CHOLHDL, LDLDIRECT in the last 72 hours. Thyroid Function Tests: No results for input(s): TSH, T4TOTAL, FREET4, T3FREE, THYROIDAB in the last 72 hours. Anemia Panel: No results for input(s): VITAMINB12, FOLATE, FERRITIN, TIBC, IRON, RETICCTPCT in the last 72 hours. Sepsis Labs: Recent Labs  Lab 04/24/17 1115 04/25/17 0442 04/29/17 0930 04/29/17 1324  LATICACIDVEN 3.53* 1.7 2.1* 2.8*    Recent Results (from the past 240 hour(s))  Blood culture (routine x 2)     Status: None   Collection Time: 04/24/17 12:26 PM  Result Value Ref Range Status   Specimen Description BLOOD RIGHT ANTECUBITAL  Final   Special Requests   Final    BOTTLES DRAWN AEROBIC AND ANAEROBIC Blood Culture adequate volume   Culture NO GROWTH 5 DAYS  Final   Report Status 04/29/2017 FINAL  Final  Blood culture (routine x 2)     Status: None   Collection Time: 04/24/17 12:35 PM  Result Value Ref Range Status   Specimen Description BLOOD RIGHT  HAND  Final   Special Requests   Final    BOTTLES DRAWN AEROBIC AND ANAEROBIC Blood Culture adequate volume   Culture NO GROWTH 5 DAYS  Final   Report Status 04/29/2017 FINAL  Final  Respiratory Panel by PCR     Status: Abnormal   Collection Time: 04/24/17  3:04 PM  Result Value Ref Range Status   Adenovirus NOT DETECTED NOT DETECTED Final   Coronavirus 229E NOT DETECTED NOT DETECTED Final   Coronavirus HKU1 NOT DETECTED NOT DETECTED Final   Coronavirus NL63 NOT DETECTED NOT DETECTED Final   Coronavirus OC43 NOT DETECTED NOT DETECTED Final   Metapneumovirus NOT DETECTED NOT DETECTED Final   Rhinovirus / Enterovirus NOT DETECTED NOT DETECTED Final   Influenza  A NOT DETECTED NOT DETECTED Final   Influenza B NOT DETECTED NOT DETECTED Final   Parainfluenza Virus 1 NOT DETECTED NOT DETECTED Final   Parainfluenza Virus 2 NOT DETECTED NOT DETECTED Final   Parainfluenza Virus 3 NOT DETECTED NOT DETECTED Final   Parainfluenza Virus 4 NOT DETECTED NOT DETECTED Final   Respiratory Syncytial Virus DETECTED (A) NOT DETECTED Final    Comment: CRITICAL RESULT CALLED TO, READ BACK BY AND VERIFIED WITH: Deeann Saint [id number] 04/25/2017 T. TYSOR    Bordetella pertussis NOT DETECTED NOT DETECTED Final   Chlamydophila pneumoniae NOT DETECTED NOT DETECTED Final   Mycoplasma pneumoniae NOT DETECTED NOT DETECTED Final         Radiology Studies: Dg Chest 2 View  Result Date: 04/29/2017 CLINICAL DATA:  Short of breath EXAM: CHEST  2 VIEW COMPARISON:  04/27/2017 FINDINGS: The heart size and mediastinal contours are within normal limits. Both lungs are clear. The visualized skeletal structures are unremarkable. IMPRESSION: No active cardiopulmonary disease. Electronically Signed   By: Franchot Gallo M.D.   On: 04/29/2017 08:05   Ct Angio Chest Pe W Or Wo Contrast  Result Date: 04/29/2017 CLINICAL DATA:  Acute presentation with wheezing and shortness of breath. EXAM: CT ANGIOGRAPHY CHEST WITH CONTRAST  TECHNIQUE: Multidetector CT imaging of the chest was performed using the standard protocol during bolus administration of intravenous contrast. Multiplanar CT image reconstructions and MIPs were obtained to evaluate the vascular anatomy. CONTRAST:  53mL ISOVUE-370 IOPAMIDOL (ISOVUE-370) INJECTION 76% COMPARISON:  Radiography same day, 04/27/2017 and 04/24/2017. FINDINGS: Cardiovascular: Pulmonary arterial opacification is good. There are no pulmonary emboli. Mild aortic atherosclerosis. No aneurysm or dissection. Coronary artery calcification. Heart size is normal. Chamber size is normal. No pericardial fluid. Mediastinum/Nodes: No adenopathy or mass. Lungs/Pleura: Mild dependent atelectasis. No consolidation or lobar collapse. No focal pulmonary lesion. Upper Abdomen: Negative Musculoskeletal: Ordinary thoracic degenerative changes. Review of the MIP images confirms the above findings. IMPRESSION: No pulmonary emboli. Mild aortic atherosclerosis. Some coronary artery calcification. Normal heart size. Mild dependent pulmonary atelectasis. Electronically Signed   By: Nelson Chimes M.D.   On: 04/29/2017 15:17        Scheduled Meds: . [START ON 05/01/2017] azithromycin  250 mg Oral Daily  . budesonide (PULMICORT) nebulizer solution  0.25 mg Nebulization BID  . cholecalciferol  1,000 Units Oral Daily  . dextromethorphan-guaiFENesin  1 tablet Oral BID  . enoxaparin (LOVENOX) injection  40 mg Subcutaneous Q24H  . folic acid  1 mg Oral Daily  . ipratropium  0.5 mg Nebulization BID  . levalbuterol  0.63 mg Nebulization BID  . LORazepam  0-4 mg Intravenous Q12H  . losartan  50 mg Oral Daily  . methylPREDNISolone (SOLU-MEDROL) injection  60 mg Intravenous Q12H  . multivitamin with minerals  1 tablet Oral Daily  . sertraline  200 mg Oral Daily  . thiamine  100 mg Oral Daily   Continuous Infusions: . sodium chloride 100 mL/hr at 04/30/17 0910     LOS: 6 days    Gentry Seeber Tanna Furry, MD [facility] Pager [phone]  If 7PM-7AM, please contact night-coverage [email or web address] Password [email or web address] 04/30/2017, 3:00 PM

## 2017-05-01 LAB — BASIC METABOLIC PANEL
Anion gap: 8 (ref 5–15)
BUN: 20 mg/dL (ref 6–20)
CO2: 23 mmol/L (ref 22–32)
Calcium: 8.2 mg/dL — ABNORMAL LOW (ref 8.9–10.3)
Chloride: 105 mmol/L (ref 101–111)
Creatinine, Ser: 0.96 mg/dL (ref 0.44–1.00)
GFR calc Af Amer: >60 mL/min (ref >60)
GFR, EST NON AFRICAN AMERICAN: 59 mL/min — AB (ref >60)
Glucose, Bld: 187 mg/dL — ABNORMAL HIGH (ref 65–99)
POTASSIUM: 4.3 mmol/L (ref 3.5–5.1)
SODIUM: 136 mmol/L (ref 135–145)

## 2017-05-01 LAB — LACTIC ACID, PLASMA: LACTIC ACID, VENOUS: 1.7 mmol/L (ref 0.5–1.9)

## 2017-05-01 MED ORDER — ALUM & MAG HYDROXIDE-SIMETH 200-200-20 MG/5ML PO SUSP
30.0000 mL | ORAL | Status: DC | PRN
  Administered 2017-05-01: 30 mL via ORAL
  Filled 2017-05-01: qty 30

## 2017-05-01 NOTE — Progress Notes (Signed)
SATURATION QUALIFICATIONS: (This note is used to comply with regulatory documentation for home oxygen)  Patient Saturations on Room Air at Rest = 94%  Patient Saturations on Room Air while Ambulating = 87%  Patient Saturations on 1 Liters of oxygen while Ambulating = 94%  Please briefly explain why patient needs home oxygen:As pt gets tired walking a significant distance( 400+ feet)  She dropped to 87% and needed supplemental oxygen to keep going at adequate sat levels 05/01/2017  Donnella Sham, PT [phone]  (pager)

## 2017-05-01 NOTE — Progress Notes (Addendum)
PROGRESS NOTE    Michelle [name]  PFX:[id number] DOB: August 11, 1945 DOA: 04/24/2017 PCP: Fanny Bien, MD   Brief Narrative: 72 year old female with history of obstructive sleep apnea, hypertension, morbid obesity, presented with shortness of breath for 2 days.  Chest x-ray with likely acute bronchitis.  Respiratory virus positive.  Assessment & Plan:  #Acute respiratory failure with hypoxia due to acute RSV bronchitis:  -Still remaining hypoxic coughing and shortness of breath.  Patient with very slow improvement.  Completed azithromycin.  Solu-Medrol.  Plan for PT OT evaluation and ambulatory oxygen assessment.  Try to wean down oxygen to room air.  Continue bronchodilators and incentive spirometer.  Continue cough medication.  Overall feeling better but not at baseline.  CT scan negative for PE.  Echo with pulmonary hypertension and EF of 55-60%. -Added codeine for the cough management. -Repeat chest x-ray in the morning.  #Lactic acidosis: Labs improved.  Blood culture negative.  Discontinue IV fluid.  #Obstructive sleep apnea continue CPAP at night.  #Essential hypertension: Continue home medication.  Monitor blood pressure.  #Morbid obesity.  #Mild transaminitis likely due to hepatic steatosis: Ultrasound of abdomen showed hepatic steatosis.  Recommend outpatient follow-up.  #Thrombocytopenia: Improved  #Alcohol abuse and concern for withdrawal: Continue CIWA protocol.  Continue vitamins and supportive care.  No sign of withdrawal day  DVT prophylaxis: Lovenox subcutaneous Code Status: Full code  family Communication: No family at bedside Disposition Plan: PT OT evaluation and case manager referral.  Likely discharge home in 1-2 days.    Consultants:   None  Procedures: None Antimicrobials: Azithromycin  Subjective: Seen and examined at bedside.  Reported weakness, coughing.  Shortness of breath is gradually improving.  Slow improvement.  Denies chest pain,  nausea vomiting. Objective: Vitals:   04/30/17 2114 04/30/17 2230 04/30/17 2330 05/01/17 0526  BP:    (!) 143/77  Pulse:    90  Resp:    18  Temp:    (!) 97.5 F (36.4 C)  TempSrc:    Axillary  SpO2: 97% 94% 98% 98%  Weight:    103.1 kg (227 lb 3.2 oz)  Height:        Intake/Output Summary (Last 24 hours) at 05/01/2017 1111 Last data filed at 05/01/2017 1000 Gross per 24 hour  Intake 2820 ml  Output -  Net 2820 ml   Filed Weights   04/29/17 0500 04/30/17 0407 05/01/17 0526  Weight: 105.4 kg (232 lb 6.4 oz) 105 kg (231 lb 6.4 oz) 103.1 kg (227 lb 3.2 oz)    Examination:  General exam: Not in distress Respiratory system: Bilateral diffuse expiratory wheeze and basal rhonchi.  Teral diffuse rhonchi with expiratory wheeze, respiratory effort normal.   Cardiovascular system: Regular rate rhythm S1-S2 normal.  No pedal edema. Gastrointestinal system: Abdomen soft, nontender.  Bowel sounds positive. Central nervous system: Alert and oriented. No focal neurological deficits. Extremities: Symmetric 5 x 5 power. Skin: No rashes, lesions or ulcers Psychiatry: Judgement and insight appear normal. Mood & affect appropriate.     Data Reviewed: I have personally reviewed following labs and imaging studies  CBC: Recent Labs  Lab 04/26/17 0325 04/27/17 0346 04/28/17 0929 04/29/17 0555 04/30/17 0341  WBC 7.3 8.1 8.7 8.1 6.9  NEUTROABS  --  5.1 6.6 5.9 5.1  HGB 13.2 12.8 14.1 12.8 13.4  HCT 39.5 38.9 41.3 38.7 40.5  MCV 100.5* 100.3* 99.8 99.5 99.8  PLT 147* 147* 159 185 329   Basic Metabolic Panel: Recent Labs  Lab 04/27/17 0346 04/28/17 0929 04/29/17 0555 04/30/17 0341 05/01/17 0727  NA 134* 134* 135 137 136  K 3.9 4.3 4.0 4.4 4.3  CL 102 104 104 107 105  CO2 23 19* 13* 22 23  GLUCOSE 157* 218* 189* 195* 187*  BUN 32* 30* 32* 26* 20  CREATININE 1.08* 1.05* 1.12* 0.99 0.96  CALCIUM 8.6* 8.5* 8.4* 8.3* 8.2*  MG 2.0 2.0 2.0 2.1  --   PHOS  --  4.2 5.3* 4.4  --     GFR: Estimated Creatinine Clearance: 60.8 mL/min (by C-G formula based on SCr of 0.96 mg/dL). Liver Function Tests: Recent Labs  Lab 04/25/17 0438 04/27/17 0346 04/28/17 0929 04/29/17 0555 04/30/17 0341  AST 56* 53* 63* 48* 38  ALT 94* 101* 136* 118* 116*  ALKPHOS 69 62 60 54 60  BILITOT 0.7 0.7 0.9 0.9 0.8  PROT 6.5 6.7 6.8 5.8* 6.1*  ALBUMIN 3.6 3.5 3.5 3.1* 3.2*   No results for input(s): LIPASE, AMYLASE in the last 168 hours. No results for input(s): AMMONIA in the last 168 hours. Coagulation Profile: No results for input(s): INR, PROTIME in the last 168 hours. Cardiac Enzymes: No results for input(s): CKTOTAL, CKMB, CKMBINDEX, TROPONINI in the last 168 hours. BNP (last 3 results) No results for input(s): PROBNP in the last 8760 hours. HbA1C: No results for input(s): HGBA1C in the last 72 hours. CBG: No results for input(s): GLUCAP in the last 168 hours. Lipid Profile: No results for input(s): CHOL, HDL, LDLCALC, TRIG, CHOLHDL, LDLDIRECT in the last 72 hours. Thyroid Function Tests: No results for input(s): TSH, T4TOTAL, FREET4, T3FREE, THYROIDAB in the last 72 hours. Anemia Panel: No results for input(s): VITAMINB12, FOLATE, FERRITIN, TIBC, IRON, RETICCTPCT in the last 72 hours. Sepsis Labs: Recent Labs  Lab 04/25/17 0442 04/29/17 0930 04/29/17 1324 05/01/17 0727  LATICACIDVEN 1.7 2.1* 2.8* 1.7    Recent Results (from the past 240 hour(s))  Blood culture (routine x 2)     Status: None   Collection Time: 04/24/17 12:26 PM  Result Value Ref Range Status   Specimen Description BLOOD RIGHT ANTECUBITAL  Final   Special Requests   Final    BOTTLES DRAWN AEROBIC AND ANAEROBIC Blood Culture adequate volume   Culture NO GROWTH 5 DAYS  Final   Report Status 04/29/2017 FINAL  Final  Blood culture (routine x 2)     Status: None   Collection Time: 04/24/17 12:35 PM  Result Value Ref Range Status   Specimen Description BLOOD RIGHT HAND  Final   Special Requests    Final    BOTTLES DRAWN AEROBIC AND ANAEROBIC Blood Culture adequate volume   Culture NO GROWTH 5 DAYS  Final   Report Status 04/29/2017 FINAL  Final  Respiratory Panel by PCR     Status: Abnormal   Collection Time: 04/24/17  3:04 PM  Result Value Ref Range Status   Adenovirus NOT DETECTED NOT DETECTED Final   Coronavirus 229E NOT DETECTED NOT DETECTED Final   Coronavirus HKU1 NOT DETECTED NOT DETECTED Final   Coronavirus NL63 NOT DETECTED NOT DETECTED Final   Coronavirus OC43 NOT DETECTED NOT DETECTED Final   Metapneumovirus NOT DETECTED NOT DETECTED Final   Rhinovirus / Enterovirus NOT DETECTED NOT DETECTED Final   Influenza A NOT DETECTED NOT DETECTED Final   Influenza B NOT DETECTED NOT DETECTED Final   Parainfluenza Virus 1 NOT DETECTED NOT DETECTED Final   Parainfluenza Virus 2 NOT DETECTED NOT DETECTED Final  Parainfluenza Virus 3 NOT DETECTED NOT DETECTED Final   Parainfluenza Virus 4 NOT DETECTED NOT DETECTED Final   Respiratory Syncytial Virus DETECTED (A) NOT DETECTED Final    Comment: CRITICAL RESULT CALLED TO, READ BACK BY AND VERIFIED WITH: Deeann Saint [id number] 04/25/2017 T. TYSOR    Bordetella pertussis NOT DETECTED NOT DETECTED Final   Chlamydophila pneumoniae NOT DETECTED NOT DETECTED Final   Mycoplasma pneumoniae NOT DETECTED NOT DETECTED Final         Radiology Studies: Ct Angio Chest Pe W Or Wo Contrast  Result Date: 04/29/2017 CLINICAL DATA:  Acute presentation with wheezing and shortness of breath. EXAM: CT ANGIOGRAPHY CHEST WITH CONTRAST TECHNIQUE: Multidetector CT imaging of the chest was performed using the standard protocol during bolus administration of intravenous contrast. Multiplanar CT image reconstructions and MIPs were obtained to evaluate the vascular anatomy. CONTRAST:  83mL ISOVUE-370 IOPAMIDOL (ISOVUE-370) INJECTION 76% COMPARISON:  Radiography same day, 04/27/2017 and 04/24/2017. FINDINGS: Cardiovascular: Pulmonary arterial opacification is good.  There are no pulmonary emboli. Mild aortic atherosclerosis. No aneurysm or dissection. Coronary artery calcification. Heart size is normal. Chamber size is normal. No pericardial fluid. Mediastinum/Nodes: No adenopathy or mass. Lungs/Pleura: Mild dependent atelectasis. No consolidation or lobar collapse. No focal pulmonary lesion. Upper Abdomen: Negative Musculoskeletal: Ordinary thoracic degenerative changes. Review of the MIP images confirms the above findings. IMPRESSION: No pulmonary emboli. Mild aortic atherosclerosis. Some coronary artery calcification. Normal heart size. Mild dependent pulmonary atelectasis. Electronically Signed   By: Nelson Chimes M.D.   On: 04/29/2017 15:17        Scheduled Meds: . budesonide (PULMICORT) nebulizer solution  0.25 mg Nebulization BID  . cholecalciferol  1,000 Units Oral Daily  . dextromethorphan-guaiFENesin  1 tablet Oral BID  . enoxaparin (LOVENOX) injection  40 mg Subcutaneous Q24H  . folic acid  1 mg Oral Daily  . ipratropium  0.5 mg Nebulization BID  . levalbuterol  0.63 mg Nebulization BID  . losartan  50 mg Oral Daily  . methylPREDNISolone (SOLU-MEDROL) injection  60 mg Intravenous Q12H  . multivitamin with minerals  1 tablet Oral Daily  . sertraline  200 mg Oral Daily  . thiamine  100 mg Oral Daily   Continuous Infusions: . sodium chloride 100 mL/hr at 05/01/17 0909     LOS: 7 days    Emersyn Wyss Tanna Furry, MD [facility] Pager [phone]  If 7PM-7AM, please contact night-coverage [email or web address] Password [email or web address] 05/01/2017, 11:11 AM

## 2017-05-01 NOTE — Evaluation (Signed)
Occupational Therapy Evaluation and Discharge Patient Details Name: Michelle [name] MRN: [id number] DOB: 1946/01/17 Today's Date: 05/01/2017    History of Present Illness pt is a 72 y/o female with pmh significant for HTN, OSA on CPAP, DDD, admitted with c/o coughing, wheezing and SOB over several days.  Work up found acute respiratory failure with hypoxia due to acute RSV bronchitis.   Clinical Impression   Pt reports she was independent with ADL and mobility PTA. Pt currently mod I for ADL and independent for functional mobility. Educated pt on pursed lip breathing, energy conservation strategies, and home safety. Pt planning to d/c home to her daughters house for a few days then transition back to her house where she lives by herself. No further acute OT needs identified; signing off at this time. Please re-consult if needs change. Thank you for this referral.    Follow Up Recommendations  No OT follow up;Supervision - Intermittent    Equipment Recommendations  Tub/shower seat    Recommendations for Other Services       Precautions / Restrictions Precautions Precautions: None Restrictions Weight Bearing Restrictions: No      Mobility Bed Mobility Overal bed mobility: Independent                Transfers Overall transfer level: Independent Equipment used: None                  Balance Overall balance assessment: No apparent balance deficits (not formally assessed)   Sitting balance-Leahy Scale: Good     Standing balance support: No upper extremity supported;Single extremity supported Standing balance-Leahy Scale: Good                             ADL either performed or assessed with clinical judgement   ADL Overall ADL's : Modified independent                                       General ADL Comments: Educated pt on pursed lip breathing, energy conservation strategies, and use of shower chair for safety with bathing.      Vision         Perception     Praxis      Pertinent Vitals/Pain Pain Assessment: No/denies pain     Hand Dominance     Extremity/Trunk Assessment Upper Extremity Assessment Upper Extremity Assessment: Defer to OT evaluation   Lower Extremity Assessment Lower Extremity Assessment: Defer to PT evaluation   Cervical / Trunk Assessment Cervical / Trunk Assessment: Normal   Communication Communication Communication: No difficulties   Cognition Arousal/Alertness: Awake/alert Behavior During Therapy: WFL for tasks assessed/performed Overall Cognitive Status: Within Functional Limits for tasks assessed                                     General Comments       Exercises     Shoulder Instructions      Home Living Family/patient expects to be discharged to:: Private residence Living Arrangements: Alone Available Help at Discharge: Family;Available 24 hours/day(planning to stay at daughters house for a few days) Type of Home: House Home Access: Stairs to enter [employer] of Steps: several Entrance Stairs-Rails: Right;Left Home Layout: One level     Bathroom Shower/Tub: Walk-in shower  Bathroom Toilet: Standard     Home Equipment: None          Prior Functioning/Environment Level of Independence: Independent                 OT Problem List:        OT Treatment/Interventions:      OT Goals(Current goals can be found in the care plan section) Acute Rehab OT Goals Patient Stated Goal: be able to be more active again OT Goal Formulation: All assessment and education complete, DC therapy  OT Frequency:     Barriers to D/C:            Co-evaluation              AM-PAC PT "6 Clicks" Daily Activity     Outcome Measure Help from another person eating meals?: None Help from another person taking care of personal grooming?: None Help from another person toileting, which includes using toliet, bedpan, or  urinal?: None Help from another person bathing (including washing, rinsing, drying)?: None Help from another person to put on and taking off regular upper body clothing?: None Help from another person to put on and taking off regular lower body clothing?: None 6 Click Score: 24   End of Session    Activity Tolerance: Patient tolerated treatment well Patient left: in bed;with call bell/phone within reach  OT Visit Diagnosis: Other abnormalities of gait and mobility (R26.89)                Time: 5597-4163 OT Time Calculation (min): 14 min Charges:  OT General Charges $OT Visit: 1 Visit OT Evaluation $OT Eval Low Complexity: 1 Low G-Codes:     Michelle [name] A. Ulice Brilliant, M.S., OTR/L Pager: [employer] 05/01/2017, 4:02 PM

## 2017-05-01 NOTE — Evaluation (Signed)
Physical Therapy Evaluation Patient Details Name: Michelle [name] MRN: [id number] DOB: [birth year] Today's Date: 05/01/2017   History of Present Illness  pt is a 72 y/o female with pmh significant for HTN, OSA on CPAP, DDD, admitted with c/o coughing, wheezing and SOB over several days.  Work up found acute respiratory failure with hypoxia due to acute RSV bronchitis.  Clinical Impression  Pt is close to baseline functioning and should be safe at home with PRN assist of daughter. There are no further acute PT needs.  Will sign off at this time.     Follow Up Recommendations No PT follow up    Equipment Recommendations  None recommended by PT    Recommendations for Other Services       Precautions / Restrictions Precautions Precautions: None      Mobility  Bed Mobility Overal bed mobility: Independent                Transfers Overall transfer level: Independent                  Ambulation/Gait Ambulation/Gait assistance: Independent Ambulation Distance (Feet): 500 Feet Assistive device: None Gait Pattern/deviations: WFL(Within Functional Limits)   Gait velocity interpretation: at or above normal speed for age/gender General Gait Details: steady, able to negotiate obstacles and scan her environment.  Still gets dyspneic and 'normal" gait speeds.  SpO2 stayed in the low 90's on RA at slower cadence, but picking up speed and not stopping for rest dropped sats on RA to 87% with quick recovery go 94% on 1 Liter.  Stairs            Wheelchair Mobility    Modified Rankin (Stroke Patients Only)       Balance Overall balance assessment: Needs assistance   Sitting balance-Leahy Scale: Good     Standing balance support: No upper extremity supported;Single extremity supported Standing balance-Leahy Scale: Good                               Pertinent Vitals/Pain Pain Assessment: No/denies pain    Home Living Family/patient expects to  be discharged to:: Private residence Living Arrangements: Alone Available Help at Discharge: Family;Available 24 hours/day(going to dtr's home for a few days) Type of Home: House Home Access: Stairs to enter Entrance Stairs-Rails: Psychiatric nurse of Steps: several Home Layout: One level        Prior Function Level of Independence: Independent               Hand Dominance        Extremity/Trunk Assessment   Upper Extremity Assessment Upper Extremity Assessment: Overall WFL for tasks assessed    Lower Extremity Assessment Lower Extremity Assessment: Overall WFL for tasks assessed       Communication   Communication: No difficulties  Cognition Arousal/Alertness: Awake/alert Behavior During Therapy: WFL for tasks assessed/performed Overall Cognitive Status: Within Functional Limits for tasks assessed                                        General Comments      Exercises     Assessment/Plan    PT Assessment Patent does not need any further PT services  PT Problem List         PT Treatment Interventions      PT  Goals (Current goals can be found in the Care Plan section)  Acute Rehab PT Goals PT Goal Formulation: All assessment and education complete, DC therapy    Frequency     Barriers to discharge        Co-evaluation               AM-PAC PT "6 Clicks" Daily Activity  Outcome Measure Difficulty turning over in bed (including adjusting bedclothes, sheets and blankets)?: None Difficulty moving from lying on back to sitting on the side of the bed? : None Difficulty sitting down on and standing up from a chair with arms (e.g., wheelchair, bedside commode, etc,.)?: None Help needed moving to and from a bed to chair (including a wheelchair)?: None Help needed walking in hospital room?: None Help needed climbing 3-5 steps with a railing? : None 6 Click Score: 24    End of Session   Activity Tolerance:  Patient tolerated treatment well Patient left: in bed;with call bell/phone within reach Nurse Communication: Mobility status PT Visit Diagnosis: Unsteadiness on feet (R26.81);Other symptoms and signs involving the nervous system (R29.898)    Time: 4492-0100 PT Time Calculation (min) (ACUTE ONLY): 33 min   Charges:   PT Evaluation $PT Eval Low Complexity: 1 Low PT Treatments $Gait Training: 8-22 mins   PT G Codes:        05-09-17  Donnella Sham, PT [phone]  (pager)  Tessie Fass Valon Glasscock 05/09/2017, 1:30 PM

## 2017-05-02 ENCOUNTER — Inpatient Hospital Stay (HOSPITAL_COMMUNITY): Payer: Medicare Other

## 2017-05-02 MED ORDER — ADULT MULTIVITAMIN W/MINERALS CH: 1.0000 | ORAL_TABLET | Freq: Every day | ORAL | 0 refills | Status: DC

## 2017-05-02 MED ORDER — ACETAMINOPHEN 325 MG PO TABS: 650.0000 mg | ORAL_TABLET | Freq: Four times a day (QID) | ORAL | Status: DC | PRN

## 2017-05-02 MED ORDER — PREDNISONE 10 MG PO TABS: ORAL_TABLET | ORAL | 0 refills | Status: DC

## 2017-05-02 MED ORDER — DM-GUAIFENESIN ER 30-600 MG PO TB12: 1.0000 | ORAL_TABLET | Freq: Two times a day (BID) | ORAL | 0 refills | Status: AC

## 2017-05-02 NOTE — Discharge Summary (Signed)
Physician Discharge Summary  Michelle [name] JJO:[id number] DOB: April 25, 1945 DOA: 04/24/2017  PCP: Fanny Bien, MD  Admit date: 04/24/2017 Discharge date: 05/02/2017  Admitted From:home Disposition:home  Recommendations for Outpatient Follow-up:  1. Follow up with PCP in 1-2 weeks 2. Please obtain BMP/CBC in one week  Home Health:no Equipment/Devices:oxygen  Discharge Condition:stable CODE STATUS:full code Diet recommendation: Heart healthy diet  Brief/Interim Summary: 72 year old female with history of obstructive sleep apnea, hypertension, morbid obesity, presented with shortness of breath for 2 days.  Chest x-ray with likely acute bronchitis.  Respiratory virus positive.  #Acute respiratory failure with hypoxia due to acute RSV bronchitis:  -Treated with IV Solu-Medrol, bronchodilators with clinical improvement.  Still requiring some oxygen especially with ambulation.  Plan to discharge with 2 L of oxygen and follow-up with PCP.  I expect patient does not need long-term oxygen therapy.  Discharging with oral prednisone with tapering dose.  Denies shortness of breath.  Cough is improving.  Mucinex ordered. -  CT scan negative for PE.  Echo with pulmonary hypertension and EF of 55-60%. -Added codeine for the cough management. -Repeat chest x-ray normal.  #Lactic acidosis: Labs improved.  Blood culture negative.   #Obstructive sleep apnea continue CPAP at night.  #Essential hypertension: Continue home medication.  Monitor blood pressure.  #Morbid obesity.  Education provided.  #Mild transaminitis likely due to hepatic steatosis: Ultrasound of abdomen showed hepatic steatosis.  Recommend outpatient follow-up.  #Thrombocytopenia: Improved  #Alcohol abuse and concern for withdrawal: Continue CIWA protocol.  Continue vitamins and supportive care.  With no sign of withdrawal.  Patient was educated to quit alcohol intake.  She verbalized understanding.   Discharge  Diagnoses:  Principal Problem:   Acute hypoxemic respiratory failure (HCC) Active Problems:   Obstructive sleep apnea   Essential hypertension   Morbid obesity (HCC)   Acute bronchitis   COPD exacerbation (HCC)   Abnormal LFTs   Thrombocytopenia (HCC)   ETOH abuse    Discharge Instructions  Discharge Instructions    Call MD for:  difficulty breathing, headache or visual disturbances   Complete by:  As directed    Call MD for:  extreme fatigue   Complete by:  As directed    Call MD for:  hives   Complete by:  As directed    Call MD for:  persistant dizziness or light-headedness   Complete by:  As directed    Call MD for:  persistant nausea and vomiting   Complete by:  As directed    Call MD for:  severe uncontrolled pain   Complete by:  As directed    Call MD for:  temperature >100.4   Complete by:  As directed    Diet - low sodium heart healthy   Complete by:  As directed    Increase activity slowly   Complete by:  As directed      Allergies as of 05/02/2017   No Known Allergies     Medication List    TAKE these medications   acetaminophen 325 MG tablet Commonly known as:  TYLENOL Take 2 tablets (650 mg total) by mouth every 6 (six) hours as needed for mild pain (or Fever >/= 101).   albuterol 108 (90 Base) MCG/ACT inhaler Commonly known as:  PROVENTIL HFA;VENTOLIN HFA Inhale 2 puffs into the lungs every 4 (four) hours as needed for wheezing or shortness of breath.   benzonatate 100 MG capsule Commonly known as:  TESSALON Take 1-2 capsules (100-200 mg  total) by mouth 3 (three) times daily as needed for cough.   dextromethorphan-guaiFENesin 30-600 MG 12hr tablet Commonly known as:  MUCINEX DM Take 1 tablet by mouth 2 (two) times daily for 7 days.   fluticasone 50 MCG/ACT nasal spray Commonly known as:  FLONASE Place 2 sprays into both nostrils daily as needed for allergies or rhinitis.   ibuprofen 200 MG tablet Commonly known as:  ADVIL,MOTRIN Take 600  mg by mouth every 6 (six) hours as needed for moderate pain.   losartan 50 MG tablet Commonly known as:  COZAAR Take 50 mg by mouth daily. What changed:  Another medication with the same name was removed. Continue taking this medication, and follow the directions you see here.   multivitamin with minerals Tabs tablet Take 1 tablet by mouth daily. Start taking on:  05/03/2017   predniSONE 10 MG tablet Commonly known as:  DELTASONE Take 40 mg for 2 days, 30 mg for 2 days, 20 mg for 2 days, 10 mg for 2 days and then stop.   sertraline 100 MG tablet Commonly known as:  ZOLOFT Take 200 mg by mouth daily.   VITAMIN D PO Take 1 tablet by mouth daily.            Durable Medical Equipment  (From admission, onward)        Start     Ordered   05/02/17 1201  DME Oxygen  Once    Question Answer Comment  Liters per Minute 2   Oxygen conserving device Yes   Oxygen delivery system Gas      05/02/17 1200     Follow-up Information    Fanny Bien, MD. Schedule an appointment as soon as possible for a visit in 1 week(s).   Specialty:  Family Medicine Contact information: [address] [place] [address] [phone]        Nelva Bush, MD .   Specialty:  Cardiology Contact information: [place] [address] [phone]          No Known Allergies  Consultations: None  Procedures/Studies: None  Subjective: Seen and examined at bedside.  Reported doing well.  Having some cough.  No shortness of breath.  Denied nausea vomiting chest pain.  Discharge Exam: Vitals:   05/02/17 0912 05/02/17 0915  BP:    Pulse:    Resp:    Temp:    SpO2: 93% 95%   Vitals:   05/02/17 0500 05/02/17 0907 05/02/17 0912 05/02/17 0915  BP: 135/75     Pulse: 63     Resp: 18     Temp: 97.6 F (36.4 C)     TempSrc: Axillary     SpO2: 96% 96% 93% 95%  Weight: 107 kg (235 lb 14.3 oz)     Height:        General: Pt is alert,  awake, not in acute distress Cardiovascular: RRR, S1/S2 +, no rubs, no gallops Respiratory: Minimal wheeze with significant improvement, no rhonchi. Abdominal: Soft, NT, ND, bowel sounds + Extremities: no edema, no cyanosis    The results of significant diagnostics from this hospitalization (including imaging, microbiology, ancillary and laboratory) are listed below for reference.     Microbiology: Recent Results (from the past 240 hour(s))  Blood culture (routine x 2)     Status: None   Collection Time: 04/24/17 12:26 PM  Result Value Ref Range Status   Specimen Description BLOOD RIGHT ANTECUBITAL  Final   Special Requests   Final    BOTTLES DRAWN AEROBIC AND ANAEROBIC Blood Culture adequate volume   Culture NO GROWTH 5 DAYS  Final   Report Status 04/29/2017 FINAL  Final  Blood culture (routine x 2)     Status: None   Collection Time: 04/24/17 12:35 PM  Result Value Ref Range Status   Specimen Description BLOOD RIGHT HAND  Final   Special Requests   Final    BOTTLES DRAWN AEROBIC AND ANAEROBIC Blood Culture adequate volume   Culture NO GROWTH 5 DAYS  Final   Report Status 04/29/2017 FINAL  Final  Respiratory Panel by PCR     Status: Abnormal   Collection Time: 04/24/17  3:04 PM  Result Value Ref Range Status   Adenovirus NOT DETECTED NOT DETECTED Final   Coronavirus 229E NOT DETECTED NOT DETECTED Final   Coronavirus HKU1 NOT DETECTED NOT DETECTED Final   Coronavirus NL63 NOT DETECTED NOT DETECTED Final   Coronavirus OC43 NOT DETECTED NOT DETECTED Final   Metapneumovirus NOT DETECTED NOT DETECTED Final   Rhinovirus / Enterovirus NOT DETECTED NOT DETECTED Final   Influenza A NOT DETECTED NOT DETECTED Final   Influenza B NOT DETECTED NOT DETECTED Final   Parainfluenza Virus 1 NOT DETECTED NOT DETECTED Final   Parainfluenza Virus 2 NOT DETECTED NOT DETECTED Final   Parainfluenza Virus 3 NOT DETECTED NOT DETECTED Final   Parainfluenza Virus 4 NOT DETECTED NOT DETECTED Final    Respiratory Syncytial Virus DETECTED (A) NOT DETECTED Final    Comment: CRITICAL RESULT CALLED TO, READ BACK BY AND VERIFIED WITH: Deeann Saint [id number] 04/25/2017 T. TYSOR    Bordetella pertussis NOT DETECTED NOT DETECTED Final   Chlamydophila pneumoniae NOT DETECTED NOT DETECTED Final   Mycoplasma pneumoniae NOT DETECTED NOT DETECTED Final     Labs: BNP (last 3 results) No results for input(s): BNP in the last 8760 hours. Basic Metabolic Panel: Recent Labs  Lab 04/27/17 0346 04/28/17 0929 04/29/17 0555 04/30/17 0341 05/01/17 0727  NA 134* 134* 135 137 136  K 3.9 4.3 4.0 4.4 4.3  CL 102 104 104 107 105  CO2 23 19* 13* 22 23  GLUCOSE 157* 218* 189* 195* 187*  BUN 32* 30* 32* 26* 20  CREATININE 1.08* 1.05* 1.12* 0.99 0.96  CALCIUM 8.6* 8.5* 8.4* 8.3* 8.2*  MG 2.0 2.0 2.0 2.1  --   PHOS  --  4.2 5.3* 4.4  --    Liver Function Tests: Recent Labs  Lab 04/27/17 0346 04/28/17 0929 04/29/17 0555 04/30/17 0341  AST 53* 63* 48* 38  ALT 101* 136* 118* 116*  ALKPHOS 62 60 54 60  BILITOT 0.7 0.9 0.9 0.8  PROT 6.7 6.8 5.8* 6.1*  ALBUMIN 3.5 3.5 3.1* 3.2*   No results for input(s): LIPASE, AMYLASE in the last 168 hours. No results for input(s): AMMONIA in the last 168 hours. CBC: Recent Labs  Lab 04/26/17 0325 04/27/17 0346 04/28/17 0929 04/29/17 0555 04/30/17 0341  WBC 7.3 8.1 8.7 8.1 6.9  NEUTROABS  --  5.1 6.6 5.9 5.1  HGB 13.2 12.8 14.1 12.8 13.4  HCT 39.5 38.9 41.3 38.7 40.5  MCV 100.5* 100.3* 99.8 99.5 99.8  PLT 147* 147* 159 185 180   Cardiac Enzymes: No results for input(s): CKTOTAL, CKMB, CKMBINDEX, TROPONINI in the last 168 hours. BNP: Invalid input(s): POCBNP CBG: No results for input(s): GLUCAP in the last 168 hours. D-Dimer No results for input(s): DDIMER in the last 72  hours. Hgb A1c No results for input(s): HGBA1C in the last 72 hours. Lipid Profile No results for input(s): CHOL, HDL, LDLCALC, TRIG, CHOLHDL, LDLDIRECT in the last 72  hours. Thyroid function studies No results for input(s): TSH, T4TOTAL, T3FREE, THYROIDAB in the last 72 hours.  Invalid input(s): FREET3 Anemia work up No results for input(s): VITAMINB12, FOLATE, FERRITIN, TIBC, IRON, RETICCTPCT in the last 72 hours. Urinalysis    Component Value Date/Time   COLORURINE YELLOW 10/06/2015 1612   APPEARANCEUR CLOUDY (A) 10/06/2015 1612   LABSPEC 1.013 10/06/2015 1612   PHURINE 5.5 10/06/2015 1612   GLUCOSEU NEGATIVE 10/06/2015 1612   HGBUR NEGATIVE 10/06/2015 1612   BILIRUBINUR NEGATIVE 10/06/2015 1612   KETONESUR NEGATIVE 10/06/2015 1612   PROTEINUR NEGATIVE 10/06/2015 1612   UROBILINOGEN 0.2 07/24/2011 0948   NITRITE NEGATIVE 10/06/2015 1612   LEUKOCYTESUR NEGATIVE 10/06/2015 1612   Sepsis Labs Invalid input(s): PROCALCITONIN,  WBC,  LACTICIDVEN Microbiology Recent Results (from the past 240 hour(s))  Blood culture (routine x 2)     Status: None   Collection Time: 04/24/17 12:26 PM  Result Value Ref Range Status   Specimen Description BLOOD RIGHT ANTECUBITAL  Final   Special Requests   Final    BOTTLES DRAWN AEROBIC AND ANAEROBIC Blood Culture adequate volume   Culture NO GROWTH 5 DAYS  Final   Report Status 04/29/2017 FINAL  Final  Blood culture (routine x 2)     Status: None   Collection Time: 04/24/17 12:35 PM  Result Value Ref Range Status   Specimen Description BLOOD RIGHT HAND  Final   Special Requests   Final    BOTTLES DRAWN AEROBIC AND ANAEROBIC Blood Culture adequate volume   Culture NO GROWTH 5 DAYS  Final   Report Status 04/29/2017 FINAL  Final  Respiratory Panel by PCR     Status: Abnormal   Collection Time: 04/24/17  3:04 PM  Result Value Ref Range Status   Adenovirus NOT DETECTED NOT DETECTED Final   Coronavirus 229E NOT DETECTED NOT DETECTED Final   Coronavirus HKU1 NOT DETECTED NOT DETECTED Final   Coronavirus NL63 NOT DETECTED NOT DETECTED Final   Coronavirus OC43 NOT DETECTED NOT DETECTED Final   Metapneumovirus  NOT DETECTED NOT DETECTED Final   Rhinovirus / Enterovirus NOT DETECTED NOT DETECTED Final   Influenza A NOT DETECTED NOT DETECTED Final   Influenza B NOT DETECTED NOT DETECTED Final   Parainfluenza Virus 1 NOT DETECTED NOT DETECTED Final   Parainfluenza Virus 2 NOT DETECTED NOT DETECTED Final   Parainfluenza Virus 3 NOT DETECTED NOT DETECTED Final   Parainfluenza Virus 4 NOT DETECTED NOT DETECTED Final   Respiratory Syncytial Virus DETECTED (A) NOT DETECTED Final    Comment: CRITICAL RESULT CALLED TO, READ BACK BY AND VERIFIED WITH: Deeann Saint [phone] 04/25/2017 T. TYSOR    Bordetella pertussis NOT DETECTED NOT DETECTED Final   Chlamydophila pneumoniae NOT DETECTED NOT DETECTED Final   Mycoplasma pneumoniae NOT DETECTED NOT DETECTED Final     Time coordinating discharge: 31 minutes  SIGNED:   Rosita Fire, MD  [facility] 05/02/2017, 12:00 PM  If 7PM-7AM, please contact night-coverage [email or web address] Password [email or web address]

## 2017-05-02 NOTE — Care Management Note (Signed)
Case Management Note  Patient Details  Name: Michelle [name] MRN: [id number] Date of Birth: 10/16/1945  Subjective/Objective: Pt presented for Acute Hypoxic Respiratory Failure. Initiated on IV Solumedrol. PTA from home with DME CPAP from [facility].                    Action/Plan: Pt in need for home 02. Pt wants referral sent to [facility] for DME 02. Pt will be staying with her daughter for additional support before admission to her home. CM did make a referral to [facility]. DME 02 will be delivered to room prior to d/c. No further needs from CM at this time.   Expected Discharge Date:  05/02/17               Expected Discharge Plan:  Home/Self Care  In-House Referral:  NA  Discharge planning Services  CM Consult  Post Acute Care Choice:  Durable Medical Equipment Choice offered to:  Patient  DME Arranged:  Oxygen DME Agency:  [place]:  NA [place] Agency:  NA  Status of Service:  Completed, signed off  If discussed at [place] of Stay Meetings, dates discussed:    Additional Comments:  Bethena Roys, RN 05/02/2017, 12:18 PM

## 2017-05-02 NOTE — Progress Notes (Signed)
SATURATION QUALIFICATIONS: (This note is used to comply with regulatory documentation for home oxygen)  Patient Saturations on Room Air at Rest = 100 %  Patient Saturations on Room Air while Ambulating = 88 %  Patient Saturations on 2 Liters of oxygen while Ambulating =94 %  Please briefly explain why patient needs home oxygen:

## 2017-05-05 ENCOUNTER — Telehealth: Payer: Self-pay | Admitting: *Deleted

## 2017-05-05 NOTE — Telephone Encounter (Signed)
-----   Message from Sueanne Margarita, MD sent at 04/26/2017  8:00 PM EST ----- Patient is not compliant with her device.  Please find out if she is having problems with her CPAP and encourage her to use at least 5 hours nightly.

## 2017-05-05 NOTE — Telephone Encounter (Addendum)
Patient states she has been in the hospital for 8 days with respiratory distress (RSV) but they provided her a cpap in the hospital. Patient has been encouraged to use her CPAP at least 5 hours nightly.

## 2017-05-07 DIAGNOSIS — J989 Respiratory disorder, unspecified: Secondary | ICD-10-CM | POA: Diagnosis not present

## 2017-05-07 DIAGNOSIS — Z6841 Body Mass Index (BMI) 40.0 and over, adult: Secondary | ICD-10-CM | POA: Diagnosis not present

## 2017-05-14 ENCOUNTER — Ambulatory Visit: Payer: Medicare Other | Admitting: Emergency Medicine

## 2017-05-14 ENCOUNTER — Encounter: Payer: Self-pay | Admitting: Emergency Medicine

## 2017-05-14 VITALS — BP 108/64 | HR 78 | Ht 60.0 in | Wt 227.6 lb

## 2017-05-14 DIAGNOSIS — R0600 Dyspnea, unspecified: Secondary | ICD-10-CM | POA: Diagnosis not present

## 2017-05-14 DIAGNOSIS — R05 Cough: Secondary | ICD-10-CM | POA: Diagnosis not present

## 2017-05-14 DIAGNOSIS — R059 Cough, unspecified: Secondary | ICD-10-CM | POA: Insufficient documentation

## 2017-05-14 NOTE — Assessment & Plan Note (Signed)
Workup as above.  Probably some degree of deconditioning.

## 2017-05-14 NOTE — Assessment & Plan Note (Signed)
With associated rhinitis.  Suspect that she will benefit from nasal steroid at least in the short-term.  We will start this daily until our next visit.

## 2017-05-14 NOTE — Assessment & Plan Note (Signed)
Recent hospitalization in the setting of RSV.  She was found to be hypoxemic, unclear whether this was due to chronic hypoventilation with a superimposed RSV.  Also consider obstructive lung disease.  She needs pulmonary function testing to sort this out.  We will arrange.  We will perform walking oximetry today to see whether she still desaturates.

## 2017-05-14 NOTE — Assessment & Plan Note (Signed)
To new CPAP and oxygen bled in.  She is tolerating well and feels that she is benefiting.

## 2017-05-14 NOTE — Progress Notes (Signed)
Subjective:    Patient ID: Michelle [name], female    DOB: 03/09/1946, 72 y.o.   MRN: [id number]  HPI 72 year old former smoker (5 pack years) with hypertension, obesity, sleep apnea (on CPAP), irritable bowel syndrome, alcohol use.  She was admitted early January with dyspnea and hypoxemia, bad cough that was not productive.  Found to have RSV, treated for bronchitis and pneumonitis with tapered prednisone, cough suppression. ? Whether this was an AE COPD.   Part of her evaluation included CT chest 04/29/17 that I reviewed.  This showed some dependent atelectasis but no other abnormality.  She was started on supplemental oxygen, currently using liters per minute. She noted some exertional limitation as long as a year ago. She still has residual cough, congestion. Clear mucous. Has used flonase prn, not lately. She has tried albuterol > no real change. She has tried to increase her exercise, still dyspneic.    Review of Systems  Constitutional: Negative for fever and unexpected weight change.  HENT: Positive for congestion and rhinorrhea. Negative for dental problem, ear pain, nosebleeds, postnasal drip, sinus pressure, sneezing, sore throat and trouble swallowing.   Eyes: Negative for redness and itching.  Respiratory: Positive for cough, chest tightness and shortness of breath.   Cardiovascular: Negative for palpitations and leg swelling.  Gastrointestinal: Negative for nausea and vomiting.  Genitourinary: Negative for dysuria.  Musculoskeletal: Negative for joint swelling.  Skin: Negative for rash.  Allergic/Immunologic: Negative.  Negative for environmental allergies, food allergies and immunocompromised state.  Neurological: Positive for headaches.  Hematological: Does not bruise/bleed easily.  Psychiatric/Behavioral: Negative for dysphoric mood. The patient is not nervous/anxious.    Past Medical History:  Diagnosis Date  . Arthritis    "back" (04/24/2017)  . 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 Chondromalacia of knee    right knee  . Chronic bronchitis ([place])   . Chronic lower back pain   . DDD (degenerative disc disease) 07-24-11   Osteoarthritis-s/p LTKA 2'11, now right planned  . Depression 07-24-11   tx. depression only  . Hx of adenomatous polyp of colon 03/21/2014  . Hyperlipidemia   . Hypertension   . IBS (irritable bowel syndrome)   . OSA on CPAP 12/07/2016  . Pneumonia 1990s X 1     Family History  Problem Relation Age of Onset  . Heart disease Mother        Rheumatic heart disease  . Pulmonary embolism Mother   . Heart disease Father        valvular heart disease  . Stroke Father   . Hypertension Sister   . Colon cancer Neg Hx   . Esophageal cancer Neg Hx   . Rectal cancer Neg Hx   . Stomach cancer Neg Hx   . Pancreatic cancer Neg Hx      Social History   Socioeconomic History  . Marital status: Divorced    Spouse name: Not on file  . Number of children: Not on file  . Years of education: Not on file  . Highest education level: Not on file  Social Needs  . Financial resource strain: Not on file  . Food insecurity - worry: Not on file  . Food insecurity - inability: Not on file  . Transportation needs - medical: Not on file  . Transportation needs - non-medical: Not on file  Occupational History  . Not on file  Tobacco Use  .  Smoking status: Former Smoker    Packs/day: 0.12    Years: 36.00    Pack years: 4.32    Types: Cigarettes    Last attempt to quit: 06/19/2014    Years since quitting: 2.9  . Smokeless tobacco: Never Used  Substance and Sexual Activity  . Alcohol use: Yes    Alcohol/week: 12.6 oz    Types: 21 Glasses of wine per week    Comment: 04/24/2017 "3, glasses of white wine qd"  . Drug use: No  . Sexual activity: No  Other Topics Concern  . Not on file  Social History Narrative  . Not on file     No Known Allergies   Outpatient Medications Prior to Visit  Medication Sig  Dispense Refill  . acetaminophen (TYLENOL) 325 MG tablet Take 2 tablets (650 mg total) by mouth every 6 (six) hours as needed for mild pain (or Fever >/= 101).    Marland Kitchen albuterol (PROVENTIL HFA;VENTOLIN HFA) 108 (90 Base) MCG/ACT inhaler Inhale 2 puffs into the lungs every 4 (four) hours as needed for wheezing or shortness of breath.    . Cholecalciferol (VITAMIN D PO) Take 1 tablet by mouth daily.    . fluticasone (FLONASE) 50 MCG/ACT nasal spray Place 2 sprays into both nostrils daily as needed for allergies or rhinitis.    Marland Kitchen ibuprofen (ADVIL,MOTRIN) 200 MG tablet Take 600 mg by mouth every 6 (six) hours as needed for moderate pain.    Marland Kitchen losartan (COZAAR) 50 MG tablet Take 50 mg by mouth daily.    . Multiple Vitamin (MULTIVITAMIN WITH MINERALS) TABS tablet Take 1 tablet by mouth daily. 30 tablet 0  . sertraline (ZOLOFT) 100 MG tablet Take 200 mg by mouth daily.   0  . predniSONE (DELTASONE) 10 MG tablet Take 40 mg for 2 days, 30 mg for 2 days, 20 mg for 2 days, 10 mg for 2 days and then stop. 20 tablet 0  . benzonatate (TESSALON) 100 MG capsule Take 1-2 capsules (100-200 mg total) by mouth 3 (three) times daily as needed for cough. (Patient not taking: Reported on 05/14/2017) 40 capsule 0   No facility-administered medications prior to visit.        Objective:   Physical Exam  Vitals:   05/14/17 1627  BP: 108/64  Pulse: 78  SpO2: 97%  Weight: 227 lb 9.6 oz (103.2 kg)  Height: 5' (1.524 m)   Gen: Pleasant, overwt woman, in no distress,  normal affect  ENT: No lesions,  mouth clear,  oropharynx clear, no postnasal drip  Neck: No JVD, no stridor  Lungs: No use of accessory muscles,  clear without rales or rhonchi  Cardiovascular: RRR, heart sounds normal, no murmur or gallops, no peripheral edema  Musculoskeletal: No deformities, no cyanosis or clubbing  Neuro: alert, non focal  Skin: Warm,bruises on both arms     Assessment & Plan:  Acute hypoxemic respiratory failure  (HCC) Recent hospitalization in the setting of RSV.  She was found to be hypoxemic, unclear whether this was due to chronic hypoventilation with a superimposed RSV.  Also consider obstructive lung disease.  She needs pulmonary function testing to sort this out.  We will arrange.  We will perform walking oximetry today to see whether she still desaturates.  Obstructive sleep apnea To new CPAP and oxygen bled in.  She is tolerating well and feels that she is benefiting.  Dyspnea on exertion Workup as above.  Probably some degree of deconditioning.  Cough With associated rhinitis.  Suspect that she will benefit from nasal steroid at least in the short-term.  We will start this daily until our next visit.  Baltazar Apo, MD, PhD 05/14/2017, 5:03 PM [facility] [phone] or if no answer [phone]

## 2017-05-14 NOTE — Patient Instructions (Signed)
We will perform pulmonary function testing  We will continue your CPAP + oxygen at night as you are using them.  Start taking flonase 2 sprays each nostril once a day until next visit Agree with slowly increasing your exercise regimen.  Walking oximetry on room air today.  Take albuterol (ProAir) 2 puffs up to every 4 hours if needed for shortness of breath. Follow with Dr Lamonte Sakai next available with full PFT on the same day

## 2017-05-15 DIAGNOSIS — R2231 Localized swelling, mass and lump, right upper limb: Secondary | ICD-10-CM | POA: Diagnosis not present

## 2017-05-15 DIAGNOSIS — M67441 Ganglion, right hand: Secondary | ICD-10-CM | POA: Diagnosis not present

## 2017-05-16 ENCOUNTER — Telehealth: Payer: Self-pay | Admitting: *Deleted

## 2017-05-16 NOTE — Telephone Encounter (Signed)
OV appt. Sched for 05/19/16 then will have the overnight pulse ox on RA and CPAP.

## 2017-05-19 ENCOUNTER — Encounter (INDEPENDENT_AMBULATORY_CARE_PROVIDER_SITE_OTHER): Payer: Self-pay

## 2017-05-19 ENCOUNTER — Encounter: Payer: Self-pay | Admitting: Cardiology

## 2017-05-19 ENCOUNTER — Ambulatory Visit: Payer: Medicare Other | Admitting: Cardiology

## 2017-05-19 VITALS — BP 120/70 | HR 90 | Ht 60.25 in | Wt 230.0 lb

## 2017-05-19 DIAGNOSIS — I10 Essential (primary) hypertension: Secondary | ICD-10-CM

## 2017-05-19 DIAGNOSIS — G4733 Obstructive sleep apnea (adult) (pediatric): Secondary | ICD-10-CM | POA: Diagnosis not present

## 2017-05-19 NOTE — Addendum Note (Signed)
Addended by: Teressa Senter on: 05/19/2017 02:03 PM   Modules accepted: Orders

## 2017-05-19 NOTE — Patient Instructions (Signed)
Medication Instructions:  Your physician recommends that you continue on your current medications as directed. Please refer to the Current Medication list given to you today.  If you need a refill on your cardiac medications, please contact your pharmacy first.  Labwork: None ordered   Testing/Procedures: None ordered   Follow-Up: Your physician wants you to follow-up in: 1 year with Dr. Radford Pax. You will receive a reminder letter in the mail two months in advance. If you don't receive a letter, please call our office to schedule the follow-up appointment.  Any Other Special Instructions Will Be Listed Below (If Applicable).  Ordered sent over to [facility] to check pulse ox on CPAP with 2L oxygen.    If you need a refill on your cardiac medications before your next appointment, please call your pharmacy.

## 2017-05-19 NOTE — Progress Notes (Signed)
Cardiology Office Note:    Date:  05/19/2017   ID:  Michelle [name], DOB May 03, 1945, MRN [id number]  PCP:  Michelle Bien, MD  Cardiologist:  Michelle Bush, MD    Referring MD: Michelle Bien, MD   Chief Complaint  Patient presents with  . Sleep Apnea  . Hypertension    History of Present Illness:    Michelle [name] is a 72 y.o. female with a hx of HTN was referred by Dr. Saunders [name] for evaluation of snoring and witnessed apnea during sleep as well as excessive daytime sleepiness.  Her ESS was only 6.  She underwent PSG showing moderate OSA with an AHI of 27/hr with oxygen desaturations as low as 82% and loud snoring.  She underwent CPAP titration to 14cm H2O and is now here for followup.  She was recently in the hospital with RSV and so she was not able to use her home CPAP device.  She is now back on her CPAP along with 2L O2 at night for nocturnal hypoxemia.   She is doing well with her CPAP device and thinks that she has gotten used to it.  She tolerates the full face mask and feels the pressure is adequate.  Since going on CPAP she feels rested in the am and has no significant daytime sleepiness.  She denies any significant mouth or nasal dryness or nasal congestion.  She does not know if she snores with her CPAP on.     Past Medical History:  Diagnosis Date  . Arthritis    "back" (04/24/2017)  . 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 Chondromalacia of knee    right knee  . Chronic bronchitis ([place])   . Chronic lower back pain   . DDD (degenerative disc disease) 07-24-11   Osteoarthritis-s/p LTKA 2'11, now right planned  . Depression 07-24-11   tx. depression only  . Hx of adenomatous polyp of colon 03/21/2014  . Hyperlipidemia   . Hypertension   . IBS (irritable bowel syndrome)   . OSA on CPAP 12/07/2016  . Pneumonia 1990s X 1    Past Surgical History:  Procedure Laterality Date  . APPENDECTOMY  1967  . BOTOX INJECTION     "face"    . CATARACT EXTRACTION W/ INTRAOCULAR LENS IMPLANT Bilateral 2000s  . [place]; 1975  . COLONOSCOPY W/ BIOPSIES AND POLYPECTOMY  "a few"  . DILATION AND CURETTAGE OF UTERUS    . HYSTEROSCOPY W/D&C  08/29/2006   and resection of endometrial polyp/notes 09/10/2010  . JOINT REPLACEMENT    . KNEE ARTHROSCOPY Bilateral   . NASAL SEPTUM SURGERY  1972  . OOPHORECTOMY Right 1994  . TOTAL KNEE ARTHROPLASTY  08/05/2011   Procedure: TOTAL KNEE ARTHROPLASTY;  Surgeon: Gearlean Alf, MD;  Location: [facility];  Service: Orthopedics;  Laterality: Right;  . TOTAL KNEE ARTHROPLASTY Left 05/2009  . TUBAL LIGATION  1975    Current Medications: Current Meds  Medication Sig  . acetaminophen (TYLENOL) 325 MG tablet Take 2 tablets (650 mg total) by mouth every 6 (six) hours as needed for mild pain (or Fever >/= 101).  Marland Kitchen albuterol (PROVENTIL HFA;VENTOLIN HFA) 108 (90 Base) MCG/ACT inhaler Inhale 2 puffs into the lungs every 4 (four) hours as needed for wheezing or shortness of breath.  . benzonatate (TESSALON) 100 MG capsule Take 1-2 capsules (100-200 mg total) by mouth 3 (three) times daily as  needed for cough.  . Cholecalciferol (VITAMIN D PO) Take 1 tablet by mouth daily.  Marland Kitchen ibuprofen (ADVIL,MOTRIN) 200 MG tablet Take 600 mg by mouth every 6 (six) hours as needed for moderate pain.  Marland Kitchen ipratropium (ATROVENT) 0.06 % nasal spray Place 2 sprays into both nostrils daily.  Marland Kitchen losartan (COZAAR) 50 MG tablet Take 50 mg by mouth daily.  . Multiple Vitamin (MULTIVITAMIN WITH MINERALS) TABS tablet Take 1 tablet by mouth daily.  . sertraline (ZOLOFT) 100 MG tablet Take 200 mg by mouth daily.      Allergies:   Patient has no known allergies.   Social History   Socioeconomic History  . Marital status: Divorced    Spouse name: None  . Number of children: None  . Years of education: None  . Highest education level: None  Social Needs  . Financial resource strain: None  . Food insecurity - worry: None   . Food insecurity - inability: None  . Transportation needs - medical: None  . Transportation needs - non-medical: None  Occupational History  . None  Tobacco Use  . Smoking status: Former Smoker    Packs/day: 0.12    Years: 36.00    Pack years: 4.32    Types: Cigarettes    Last attempt to quit: 06/19/2014    Years since quitting: 2.9  . Smokeless tobacco: Never Used  Substance and Sexual Activity  . Alcohol use: Yes    Alcohol/week: 12.6 oz    Types: 21 Glasses of wine per week    Comment: 04/24/2017 "3, glasses of white wine qd"  . Drug use: No  . Sexual activity: No  Other Topics Concern  . None  Social History Narrative  . None     Family History: The patient's family history includes Heart disease in her father and mother; Hypertension in her sister; Pulmonary embolism in her mother; Stroke in her father. There is no history of Colon cancer, Esophageal cancer, Rectal cancer, Stomach cancer, or Pancreatic cancer.  ROS:   Please see the history of present illness.    ROS  All other systems reviewed and negative.   EKGs/Labs/Other Studies Reviewed:    The following studies were reviewed today: CPAP donwload  EKG:  EKG is not ordered today.    Recent Labs: 07/31/2016: TSH 1.210 04/30/2017: ALT 116; Hemoglobin 13.4; Magnesium 2.1; Platelets 180 05/01/2017: BUN 20; Creatinine, Ser 0.96; Potassium 4.3; Sodium 136   Recent Lipid Panel No results found for: CHOL, TRIG, HDL, CHOLHDL, VLDL, LDLCALC, LDLDIRECT  Physical Exam:    VS:  BP 120/70   Pulse 90   Ht 5' 0.25" (1.53 m)   Wt 230 lb (104.3 kg)   BMI 44.55 kg/m     Wt Readings from Last 3 Encounters:  05/19/17 230 lb (104.3 kg)  05/14/17 227 lb 9.6 oz (103.2 kg)  05/02/17 235 lb 14.3 oz (107 kg)     GEN:  Well nourished, well developed in no acute distress HEENT: Normal NECK: No JVD; No carotid bruits LYMPHATICS: No lymphadenopathy CARDIAC: RRR, no murmurs, rubs, gallops RESPIRATORY:  Clear to  auscultation without rales, wheezing or rhonchi  ABDOMEN: Soft, non-tender, non-distended MUSCULOSKELETAL:  No edema; No deformity  SKIN: Warm and dry NEUROLOGIC:  Alert and oriented x 3 PSYCHIATRIC:  Normal affect   ASSESSMENT:    1. Obstructive sleep apnea   2. Essential hypertension   3. Morbid obesity ([place])    PLAN:    In order of problems  listed above:  1.  OSA - the patient is tolerating PAP therapy well without any problems. The PAP download was reviewed today and showed an AHI of 2.4/hr on 14 cm H2O with 50% compliance in using more than 4 hours nightly.  The patient has been using and benefiting from PAP use and will continue to benefit from therapy. Her compliance dropped since she was hospitalized for RSV and did not use her device.  She used the device in the hospital and therefore her compliance showed low on her machine.  She loves her CPAP and uses it every night all night. I will repeat a pulse ox on CPAP and 2L to make sure she has no residual hypoxemia.  2.  HTN - her BP is well controlled on exam today. She will continue on Losartan 50mg  daily.    3.  Morbid obesity - I have encouraged her to get into a routine exercise program and cut back on carbs and portions.    Medication Adjustments/Labs and Tests Ordered: Current medicines are reviewed at length with the patient today.  Concerns regarding medicines are outlined above.  No orders of the defined types were placed in this encounter.  No orders of the defined types were placed in this encounter.   Signed, Fransico Him, MD  05/19/2017 1:42 PM    [place]

## 2017-05-20 DIAGNOSIS — H26492 Other secondary cataract, left eye: Secondary | ICD-10-CM | POA: Diagnosis not present

## 2017-05-20 DIAGNOSIS — D3131 Benign neoplasm of right choroid: Secondary | ICD-10-CM | POA: Diagnosis not present

## 2017-05-20 DIAGNOSIS — Z961 Presence of intraocular lens: Secondary | ICD-10-CM | POA: Diagnosis not present

## 2017-05-22 DIAGNOSIS — G4733 Obstructive sleep apnea (adult) (pediatric): Secondary | ICD-10-CM | POA: Diagnosis not present

## 2017-05-22 NOTE — Telephone Encounter (Signed)
Pulse ox check on CPAP at 2L O2 order sent to [facility] via community message

## 2017-05-22 NOTE — Telephone Encounter (Signed)
-----   Message from [place], [place] sent at 05/19/2017  2:03 PM EST ----- Regarding: dme order DME order placed   Thanks Rena

## 2017-05-29 ENCOUNTER — Encounter: Payer: Self-pay | Admitting: Cardiology

## 2017-05-29 DIAGNOSIS — R0902 Hypoxemia: Secondary | ICD-10-CM | POA: Diagnosis not present

## 2017-05-29 DIAGNOSIS — J449 Chronic obstructive pulmonary disease, unspecified: Secondary | ICD-10-CM | POA: Diagnosis not present

## 2017-06-03 DIAGNOSIS — G4733 Obstructive sleep apnea (adult) (pediatric): Secondary | ICD-10-CM | POA: Diagnosis not present

## 2017-06-03 DIAGNOSIS — R0902 Hypoxemia: Secondary | ICD-10-CM | POA: Diagnosis not present

## 2017-06-03 DIAGNOSIS — J441 Chronic obstructive pulmonary disease with (acute) exacerbation: Secondary | ICD-10-CM | POA: Diagnosis not present

## 2017-06-04 ENCOUNTER — Other Ambulatory Visit: Payer: Self-pay

## 2017-06-04 DIAGNOSIS — D481 Neoplasm of uncertain behavior of connective and other soft tissue: Secondary | ICD-10-CM | POA: Diagnosis not present

## 2017-06-04 DIAGNOSIS — R2231 Localized swelling, mass and lump, right upper limb: Secondary | ICD-10-CM | POA: Diagnosis not present

## 2017-06-04 DIAGNOSIS — M71341 Other bursal cyst, right hand: Secondary | ICD-10-CM | POA: Diagnosis not present

## 2017-06-04 DIAGNOSIS — M67441 Ganglion, right hand: Secondary | ICD-10-CM | POA: Diagnosis not present

## 2017-06-04 DIAGNOSIS — M7989 Other specified soft tissue disorders: Secondary | ICD-10-CM | POA: Diagnosis not present

## 2017-06-09 DIAGNOSIS — Z139 Encounter for screening, unspecified: Secondary | ICD-10-CM | POA: Diagnosis not present

## 2017-06-09 DIAGNOSIS — I10 Essential (primary) hypertension: Secondary | ICD-10-CM | POA: Diagnosis not present

## 2017-06-09 DIAGNOSIS — E559 Vitamin D deficiency, unspecified: Secondary | ICD-10-CM | POA: Diagnosis not present

## 2017-06-11 DIAGNOSIS — E782 Mixed hyperlipidemia: Secondary | ICD-10-CM | POA: Diagnosis not present

## 2017-06-11 DIAGNOSIS — I10 Essential (primary) hypertension: Secondary | ICD-10-CM | POA: Diagnosis not present

## 2017-06-11 DIAGNOSIS — E559 Vitamin D deficiency, unspecified: Secondary | ICD-10-CM | POA: Diagnosis not present

## 2017-06-11 DIAGNOSIS — F411 Generalized anxiety disorder: Secondary | ICD-10-CM | POA: Diagnosis not present

## 2017-06-12 ENCOUNTER — Telehealth: Payer: Self-pay | Admitting: *Deleted

## 2017-06-12 DIAGNOSIS — G4733 Obstructive sleep apnea (adult) (pediatric): Secondary | ICD-10-CM

## 2017-06-12 NOTE — Telephone Encounter (Signed)
-----   Message from Teressa Senter, RN sent at 06/05/2017  9:34 AM EST ----- To Gae Bon

## 2017-06-12 NOTE — Telephone Encounter (Signed)
Patient understands she continues to have hypoxemia despite 2L [place] Patient understands that Dr Radford Pax has increased her 02 to 3L [place] at night with PAP therapy and repeat pulse ox. Patient agrees with treatment.  Order sent to [facility]

## 2017-06-12 NOTE — Telephone Encounter (Signed)
Patient is going out of town march 2-9 and will not be able to take the 02 with her on the plane but will take the CPAP and she will have [facility] do the 02 increase when she returns.

## 2017-06-16 ENCOUNTER — Ambulatory Visit (INDEPENDENT_AMBULATORY_CARE_PROVIDER_SITE_OTHER): Payer: Medicare Other | Admitting: Emergency Medicine

## 2017-06-16 ENCOUNTER — Ambulatory Visit: Payer: Medicare Other | Admitting: Emergency Medicine

## 2017-06-16 ENCOUNTER — Encounter: Payer: Self-pay | Admitting: Emergency Medicine

## 2017-06-16 DIAGNOSIS — R0600 Dyspnea, unspecified: Secondary | ICD-10-CM

## 2017-06-16 DIAGNOSIS — R0609 Other forms of dyspnea: Secondary | ICD-10-CM

## 2017-06-16 DIAGNOSIS — R05 Cough: Secondary | ICD-10-CM | POA: Diagnosis not present

## 2017-06-16 DIAGNOSIS — R059 Cough, unspecified: Secondary | ICD-10-CM

## 2017-06-16 DIAGNOSIS — G4733 Obstructive sleep apnea (adult) (pediatric): Secondary | ICD-10-CM | POA: Diagnosis not present

## 2017-06-16 DIAGNOSIS — J441 Chronic obstructive pulmonary disease with (acute) exacerbation: Secondary | ICD-10-CM | POA: Diagnosis not present

## 2017-06-16 DIAGNOSIS — R0902 Hypoxemia: Secondary | ICD-10-CM | POA: Diagnosis not present

## 2017-06-16 LAB — PULMONARY FUNCTION TEST
DL/VA % pred: 96 %
DL/VA: 4.27 ml/min/mmHg/L
DLCO cor % pred: 68 %
DLCO cor: 13.83 ml/min/mmHg
DLCO unc % pred: 68 %
DLCO unc: 13.83 ml/min/mmHg
FEF 25-75 Post: 2.14 L/sec
FEF 25-75 Pre: 1.97 L/sec
FEF2575-%Change-Post: 8 %
FEF2575-%Pred-Post: 123 %
FEF2575-%Pred-Pre: 114 %
FEV1-%Change-Post: 0 %
FEV1-%Pred-Post: 92 %
FEV1-%Pred-Pre: 91 %
FEV1-Post: 1.84 L
FEV1-Pre: 1.82 L
FEV1FVC-%Change-Post: 4 %
FEV1FVC-%Pred-Pre: 107 %
FEV6-%Change-Post: -3 %
FEV6-%Pred-Post: 85 %
FEV6-%Pred-Pre: 88 %
FEV6-Post: 2.13 L
FEV6-Pre: 2.22 L
FEV6FVC-%Pred-Post: 105 %
FEV6FVC-%Pred-Pre: 105 %
FVC-%Change-Post: -3 %
FVC-%Pred-Post: 80 %
FVC-%Pred-Pre: 84 %
FVC-Post: 2.13 L
FVC-Pre: 2.22 L
Post FEV1/FVC ratio: 86 %
Post FEV6/FVC ratio: 100 %
Pre FEV1/FVC ratio: 82 %
Pre FEV6/FVC Ratio: 100 %
RV % pred: 83 %
RV: 1.7 L
TLC % pred: 81 %
TLC: 3.76 L

## 2017-06-16 NOTE — Patient Instructions (Signed)
Please continue your CPAP as you have been using it.  Work to obtain a new mask, titrate your oxygen needs. Follow with Dr Lamonte Sakai if needed for any changes in your breathing

## 2017-06-16 NOTE — Assessment & Plan Note (Signed)
Significantly improved.  No evidence of COPD on her pulmonary function testing.  There is some mild restriction.  She is about to start an exercise regimen.  Encouraged her to do this.

## 2017-06-16 NOTE — Assessment & Plan Note (Signed)
She is working on getting a new mask.  She is also undergone a recent nocturnal oximetry that showed continues desaturations even on her CPAP.  Her oxygen is being uptitrated.

## 2017-06-16 NOTE — Assessment & Plan Note (Signed)
Improved.  She has been using a nasal steroid, is going to do a trial off of this to see if she still needs treatment.

## 2017-06-16 NOTE — Progress Notes (Signed)
Subjective:    Patient ID: Michelle [name], female    DOB: 11-05-1945, 72 y.o.   MRN: [id number]  Shortness of Breath  Associated symptoms include headaches and rhinorrhea. Pertinent negatives include no ear pain, fever, leg swelling, rash, sore throat or vomiting.   72 year old former smoker (5 pack years) with hypertension, obesity, sleep apnea (on CPAP), irritable bowel syndrome, alcohol use.  She was admitted early January with dyspnea and hypoxemia, bad cough that was not productive.  Found to have RSV, treated for bronchitis and pneumonitis with tapered prednisone, cough suppression. ? Whether this was an AE COPD.   Part of her evaluation included CT chest 04/29/17 that I reviewed.  This showed some dependent atelectasis but no other abnormality.  She was started on supplemental oxygen, currently using liters per minute. She noted some exertional limitation as long as a year ago. She still has residual cough, congestion. Clear mucous. Has used flonase prn, not lately. She has tried albuterol > no real change. She has tried to increase her exercise, still dyspneic.   ROV 06/16/17 --follow-up visit for evaluation of dyspnea and also some cough.  She had RSV and admission as outlined above.  We performed PFT today that I have reviewed > reassuring, normal airflows and volumes, decreased diffusion that corrects for Va. She feels well - back to baseline, is planning to restart exercise. Still has some slight residual cough. She has taken nasacort with some improvement. She is ready to stop it.    Review of Systems  Constitutional: Negative for fever and unexpected weight change.  HENT: Positive for congestion and rhinorrhea. Negative for dental problem, ear pain, nosebleeds, postnasal drip, sinus pressure, sneezing, sore throat and trouble swallowing.   Eyes: Negative for redness and itching.  Respiratory: Positive for cough, chest tightness and shortness of breath.   Cardiovascular: Negative for  palpitations and leg swelling.  Gastrointestinal: Negative for nausea and vomiting.  Genitourinary: Negative for dysuria.  Musculoskeletal: Negative for joint swelling.  Skin: Negative for rash.  Allergic/Immunologic: Negative.  Negative for environmental allergies, food allergies and immunocompromised state.  Neurological: Positive for headaches.  Hematological: Does not bruise/bleed easily.  Psychiatric/Behavioral: Negative for dysphoric mood. The patient is not nervous/anxious.    Past Medical History:  Diagnosis Date  . Arthritis    "back" (04/24/2017)  . 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 Chondromalacia of knee    right knee  . Chronic bronchitis ([place])   . Chronic lower back pain   . DDD (degenerative disc disease) 07-24-11   Osteoarthritis-s/p LTKA 2'11, now right planned  . Depression 07-24-11   tx. depression only  . Hx of adenomatous polyp of colon 03/21/2014  . Hyperlipidemia   . Hypertension   . IBS (irritable bowel syndrome)   . OSA on CPAP 12/07/2016  . Pneumonia 1990s X 1     Family History  Problem Relation Age of Onset  . Heart disease Mother        Rheumatic heart disease  . Pulmonary embolism Mother   . Heart disease Father        valvular heart disease  . Stroke Father   . Hypertension Sister   . Colon cancer Neg Hx   . Esophageal cancer Neg Hx   . Rectal cancer Neg Hx   . Stomach cancer Neg Hx   . Pancreatic cancer Neg Hx      Social History  Socioeconomic History  . Marital status: Divorced    Spouse name: Not on file  . Number of children: Not on file  . Years of education: Not on file  . Highest education level: Not on file  Social Needs  . Financial resource strain: Not on file  . Food insecurity - worry: Not on file  . Food insecurity - inability: Not on file  . Transportation needs - medical: Not on file  . Transportation needs - non-medical: Not on file  Occupational History  . Not on file    Tobacco Use  . Smoking status: Former Smoker    Packs/day: 0.12    Years: 36.00    Pack years: 4.32    Types: Cigarettes    Last attempt to quit: 06/19/2014    Years since quitting: 2.9  . Smokeless tobacco: Never Used  Substance and Sexual Activity  . Alcohol use: Yes    Alcohol/week: 12.6 oz    Types: 21 Glasses of wine per week    Comment: 04/24/2017 "3, glasses of white wine qd"  . Drug use: No  . Sexual activity: No  Other Topics Concern  . Not on file  Social History Narrative  . Not on file     No Known Allergies   Outpatient Medications Prior to Visit  Medication Sig Dispense Refill  . Ascorbic Acid (VITAMIN C) 1000 MG tablet Take 2,000 mg by mouth daily.    . Cholecalciferol (VITAMIN D PO) Take 1 tablet by mouth daily.    Marland Kitchen losartan (COZAAR) 50 MG tablet Take 50 mg by mouth daily.    . Multiple Vitamin (MULTIVITAMIN WITH MINERALS) TABS tablet Take 1 tablet by mouth daily. 30 tablet 0  . pyridOXINE (VITAMIN B-6) 100 MG tablet Take 100 mg by mouth daily.    . sertraline (ZOLOFT) 100 MG tablet Take 200 mg by mouth daily.   0  . acetaminophen (TYLENOL) 325 MG tablet Take 2 tablets (650 mg total) by mouth every 6 (six) hours as needed for mild pain (or Fever >/= 101).    Marland Kitchen albuterol (PROVENTIL HFA;VENTOLIN HFA) 108 (90 Base) MCG/ACT inhaler Inhale 2 puffs into the lungs every 4 (four) hours as needed for wheezing or shortness of breath.    . benzonatate (TESSALON) 100 MG capsule Take 1-2 capsules (100-200 mg total) by mouth 3 (three) times daily as needed for cough. 40 capsule 0  . ibuprofen (ADVIL,MOTRIN) 200 MG tablet Take 600 mg by mouth every 6 (six) hours as needed for moderate pain.    Marland Kitchen ipratropium (ATROVENT) 0.06 % nasal spray Place 2 sprays into both nostrils daily.     No facility-administered medications prior to visit.        Objective:   Physical Exam  Vitals:   06/16/17 1549 06/16/17 1554  BP:  122/72  Pulse:  (!) 101  SpO2:  94%  Weight: 227 lb (103  kg)   Height: 5\' 1"  (1.549 m)    Gen: Pleasant, overwt woman, in no distress,  normal affect  ENT: No lesions,  mouth clear,  oropharynx clear, no postnasal drip  Neck: No JVD, no stridor  Lungs: No use of accessory muscles,  clear without rales or rhonchi  Cardiovascular: RRR, heart sounds normal, no murmur or gallops, no peripheral edema  Musculoskeletal: No deformities, no cyanosis or clubbing  Neuro: alert, non focal  Skin: Warm,bruises on both arms     Assessment & Plan:  Cough Improved.  She has been  using a nasal steroid, is going to do a trial off of this to see if she still needs treatment.  Dyspnea on exertion Significantly improved.  No evidence of COPD on her pulmonary function testing.  There is some mild restriction.  She is about to start an exercise regimen.  Encouraged her to do this.  Obstructive sleep apnea She is working on getting a new mask.  She is also undergone a recent nocturnal oximetry that showed continues desaturations even on her CPAP.  Her oxygen is being uptitrated.  Baltazar Apo, MD, PhD 06/16/2017, 4:24 PM [facility] [phone] or if no answer [phone]

## 2017-06-16 NOTE — Progress Notes (Signed)
PFT completed today 06/16/17  

## 2017-06-17 DIAGNOSIS — R2231 Localized swelling, mass and lump, right upper limb: Secondary | ICD-10-CM | POA: Diagnosis not present

## 2017-06-17 DIAGNOSIS — Z4789 Encounter for other orthopedic aftercare: Secondary | ICD-10-CM | POA: Diagnosis not present

## 2017-06-27 ENCOUNTER — Telehealth: Payer: Self-pay | Admitting: *Deleted

## 2017-06-27 NOTE — Telephone Encounter (Signed)
Patient is going out of town march 2-9 and will not be able to take the 02 with her on the plane but will take the CPAP and she will have [facility] do the 02 increase when she returns.  Reached out to the patient today to follow up on patients 02 increase and her repeat pulse ox but there was no answer and no room on voicemail to leave a message.

## 2017-07-01 DIAGNOSIS — J441 Chronic obstructive pulmonary disease with (acute) exacerbation: Secondary | ICD-10-CM | POA: Diagnosis not present

## 2017-07-01 DIAGNOSIS — G4733 Obstructive sleep apnea (adult) (pediatric): Secondary | ICD-10-CM | POA: Diagnosis not present

## 2017-07-01 DIAGNOSIS — J449 Chronic obstructive pulmonary disease, unspecified: Secondary | ICD-10-CM | POA: Diagnosis not present

## 2017-07-01 DIAGNOSIS — R0902 Hypoxemia: Secondary | ICD-10-CM | POA: Diagnosis not present

## 2017-07-09 DIAGNOSIS — I10 Essential (primary) hypertension: Secondary | ICD-10-CM | POA: Diagnosis not present

## 2017-07-09 DIAGNOSIS — E782 Mixed hyperlipidemia: Secondary | ICD-10-CM | POA: Diagnosis not present

## 2017-07-09 DIAGNOSIS — R739 Hyperglycemia, unspecified: Secondary | ICD-10-CM | POA: Diagnosis not present

## 2017-07-15 NOTE — Telephone Encounter (Signed)
Called patient to check the status of her order to increase O2 to 3L Michelle [name] at night with PAP therapy and repeat pulse ox but patient states she was at the doctors office and would call me back. Awaiting a call back.

## 2017-07-21 DIAGNOSIS — M79641 Pain in right hand: Secondary | ICD-10-CM | POA: Diagnosis not present

## 2017-07-28 DIAGNOSIS — M79641 Pain in right hand: Secondary | ICD-10-CM | POA: Diagnosis not present

## 2017-08-01 DIAGNOSIS — G4733 Obstructive sleep apnea (adult) (pediatric): Secondary | ICD-10-CM | POA: Diagnosis not present

## 2017-08-01 DIAGNOSIS — R0902 Hypoxemia: Secondary | ICD-10-CM | POA: Diagnosis not present

## 2017-08-01 DIAGNOSIS — J441 Chronic obstructive pulmonary disease with (acute) exacerbation: Secondary | ICD-10-CM | POA: Diagnosis not present

## 2017-08-05 ENCOUNTER — Telehealth: Payer: Self-pay | Admitting: Cardiology

## 2017-08-05 NOTE — Telephone Encounter (Signed)
New Message   Patient indicates that she no longer needs the oxygen. Therefore [facility] is stating that it would need to be signed off. Please call to discuss.

## 2017-08-06 DIAGNOSIS — M79641 Pain in right hand: Secondary | ICD-10-CM | POA: Diagnosis not present

## 2017-08-08 NOTE — Telephone Encounter (Signed)
I cannot stop O2 unless we get an overnight pulse ox on PAP therapy and off O2 to document that she no longer desaturates during sleep

## 2017-08-08 NOTE — Telephone Encounter (Signed)
Reached out to the patient and was informed by the patient that she now walks 2 1/2 miles a day and feels she no longer needs the oxygen. Patient would like for Dr Radford Pax to sign off that she no longer needs the oxygen.

## 2017-09-05 NOTE — Telephone Encounter (Signed)
Called patient to follow up and give Dr turners recommendations and patient stated she signed an AMA form to stop O2 therapy. DME came and picked up O2 last week. Patient is aware and agreeable to staying off O2 and states she is doing well.

## 2017-09-17 DIAGNOSIS — Z0000 Encounter for general adult medical examination without abnormal findings: Secondary | ICD-10-CM | POA: Diagnosis not present

## 2017-09-17 DIAGNOSIS — Z23 Encounter for immunization: Secondary | ICD-10-CM | POA: Diagnosis not present

## 2017-09-17 DIAGNOSIS — Z6841 Body Mass Index (BMI) 40.0 and over, adult: Secondary | ICD-10-CM | POA: Diagnosis not present

## 2017-09-17 DIAGNOSIS — I10 Essential (primary) hypertension: Secondary | ICD-10-CM | POA: Diagnosis not present

## 2017-10-15 DIAGNOSIS — Z23 Encounter for immunization: Secondary | ICD-10-CM | POA: Diagnosis not present

## 2017-10-29 DIAGNOSIS — N958 Other specified menopausal and perimenopausal disorders: Secondary | ICD-10-CM | POA: Diagnosis not present

## 2017-10-29 DIAGNOSIS — M8588 Other specified disorders of bone density and structure, other site: Secondary | ICD-10-CM | POA: Diagnosis not present

## 2017-12-18 DIAGNOSIS — M25372 Other instability, left ankle: Secondary | ICD-10-CM | POA: Diagnosis not present

## 2017-12-18 DIAGNOSIS — L720 Epidermal cyst: Secondary | ICD-10-CM | POA: Diagnosis not present

## 2017-12-18 DIAGNOSIS — M25572 Pain in left ankle and joints of left foot: Secondary | ICD-10-CM | POA: Diagnosis not present

## 2017-12-25 DIAGNOSIS — R0902 Hypoxemia: Secondary | ICD-10-CM | POA: Diagnosis not present

## 2017-12-25 DIAGNOSIS — J441 Chronic obstructive pulmonary disease with (acute) exacerbation: Secondary | ICD-10-CM | POA: Diagnosis not present

## 2017-12-25 DIAGNOSIS — G4733 Obstructive sleep apnea (adult) (pediatric): Secondary | ICD-10-CM | POA: Diagnosis not present

## 2018-01-01 DIAGNOSIS — S93402D Sprain of unspecified ligament of left ankle, subsequent encounter: Secondary | ICD-10-CM | POA: Diagnosis not present

## 2018-01-01 DIAGNOSIS — M25562 Pain in left knee: Secondary | ICD-10-CM | POA: Diagnosis not present

## 2018-01-28 DIAGNOSIS — I10 Essential (primary) hypertension: Secondary | ICD-10-CM | POA: Diagnosis not present

## 2018-01-28 DIAGNOSIS — F418 Other specified anxiety disorders: Secondary | ICD-10-CM | POA: Diagnosis not present

## 2018-01-28 DIAGNOSIS — Z23 Encounter for immunization: Secondary | ICD-10-CM | POA: Diagnosis not present

## 2018-02-05 ENCOUNTER — Encounter (INDEPENDENT_AMBULATORY_CARE_PROVIDER_SITE_OTHER): Payer: Medicare Other

## 2018-02-18 ENCOUNTER — Encounter (INDEPENDENT_AMBULATORY_CARE_PROVIDER_SITE_OTHER): Payer: Self-pay | Admitting: Family Medicine

## 2018-02-18 ENCOUNTER — Ambulatory Visit (INDEPENDENT_AMBULATORY_CARE_PROVIDER_SITE_OTHER): Payer: Medicare Other | Admitting: Family Medicine

## 2018-02-18 VITALS — BP 123/81 | HR 68 | Temp 97.4°F | Ht 61.0 in | Wt 225.0 lb

## 2018-02-18 DIAGNOSIS — R5383 Other fatigue: Secondary | ICD-10-CM | POA: Diagnosis not present

## 2018-02-18 DIAGNOSIS — E559 Vitamin D deficiency, unspecified: Secondary | ICD-10-CM

## 2018-02-18 DIAGNOSIS — Z6841 Body Mass Index (BMI) 40.0 and over, adult: Secondary | ICD-10-CM

## 2018-02-18 DIAGNOSIS — Z1331 Encounter for screening for depression: Secondary | ICD-10-CM | POA: Diagnosis not present

## 2018-02-18 DIAGNOSIS — R0602 Shortness of breath: Secondary | ICD-10-CM

## 2018-02-18 DIAGNOSIS — I10 Essential (primary) hypertension: Secondary | ICD-10-CM | POA: Diagnosis not present

## 2018-02-18 DIAGNOSIS — Z0289 Encounter for other administrative examinations: Secondary | ICD-10-CM

## 2018-02-18 NOTE — Progress Notes (Signed)
Office: [phone]  /  Fax: [phone]   Dear Dr. Orland Mustard,   Thank you for referring Michelle [name] to our clinic. The following note includes my evaluation and treatment recommendations.  HPI:   Chief Complaint: OBESITY    DORCAS [name] has been referred by London Pepper, MD for consultation regarding her obesity and obesity related comorbidities.    Michelle [name] (MR# [id number]) is a 72 y.o. female who presents on 02/18/2018 for obesity evaluation and treatment. Current BMI is Body mass index is 42.51 kg/m.Michelle [name] has been struggling with her weight for many years and has been unsuccessful in either losing weight, maintaining weight loss, or reaching her healthy weight goal.     Amita does social eating, she belongs to a [employer] and eats out at lunch mostly.     Caitlinn attended our information session and states she is currently in the action stage of change and ready to dedicate time achieving and maintaining a healthier weight. Michelle [name] is interested in becoming our patient and working on intensive lifestyle modifications including (but not limited to) diet, exercise and weight loss.    Michelle [name] states she thinks her family will eat healthier with  her her desired weight loss is 75 lbs she started gaining weight after her divorce in 2001 her heaviest weight ever was 230 lbs she is a picky eater and doesn't like to eat healthier foods  she has significant food cravings issues  she snacks frequently in the evenings she skips meals frequently she is frequently drinking liquids with calories she frequently makes poor food choices she frequently eats larger portions than normal  she has binge eating behaviors she struggles with emotional eating    Fatigue Maelani feels her energy is lower than it should be. This has worsened with weight gain and has not worsened recently. Aleysia admits to daytime somnolence and  admits to waking up still tired. Patient has a history  of obstructive sleep apnea with the use of CPAP. Patent has a history of symptoms of daytime fatigue. Patient generally gets 10 hours of sleep per night, and states they generally have generally restful sleep. Snoring is present but not with CPAP. Apneic episodes are present. Epworth Sleepiness Score is 2.  Dyspnea on exertion Michelle [name] notes increasing shortness of breath with exercising and seems to be worsening over time with weight gain. She notes getting out of breath sooner with activity than she used to. This has not gotten worse recently. EKG-poor R wave progression and normal sinus rhythm. Lynnett denies orthopnea.  Hypertension [name] is a 72 y.o. female with hypertension. Michelle [name]'s blood pressure is controlled today. She denies chest pain, chest pressure, or headaches. She is working weight loss to help control her blood pressure with the goal of decreasing her risk of heart attack and stroke.   Vitamin D Deficiency Michelle [name] has a diagnosis of vitamin D deficiency. She is not on OTC Vit D and denies nausea, vomiting or muscle weakness.  Depression Screen Michelle [name]'s Food and Mood (modified PHQ-9) score was  Depression screen PHQ 2/9 02/18/2018  Decreased Interest 2  Down, Depressed, Hopeless 2  PHQ - 2 Score 4  Altered sleeping 2  Tired, decreased energy 2  Change in appetite 2  Feeling bad or failure about yourself  2  Trouble concentrating 2  Moving slowly or fidgety/restless 1  Suicidal thoughts 0  PHQ-9 Score 15  Difficult doing work/chores Somewhat difficult    ALLERGIES: No Known Allergies  MEDICATIONS: Current Outpatient Medications on File Prior to Visit  Medication Sig Dispense Refill  . losartan (COZAAR) 50 MG tablet Take 50 mg by mouth daily.    . sertraline (ZOLOFT) 100 MG tablet Take 200 mg by mouth daily.   0   No current facility-administered medications on file prior to visit.     PAST MEDICAL HISTORY: Past Medical History:  Diagnosis Date    . Anxiety   . Arthritis    "back" (04/24/2017)  . 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 Chondromalacia of knee    right knee  . Chronic bronchitis ([place])   . Chronic lower back pain   . DDD (degenerative disc disease) 07-24-11   Osteoarthritis-s/p LTKA 2'11, now right planned  . Depression 07-24-11   tx. depression only  . Fatty liver   . Hx of adenomatous polyp of colon 03/21/2014  . Hyperlipidemia   . Hypertension   . IBS (irritable bowel syndrome)   . OSA on CPAP 12/07/2016  . Pneumonia 1990s X 1  . SOB (shortness of breath)   . Vitamin D deficiency     PAST SURGICAL HISTORY: Past Surgical History:  Procedure Laterality Date  . APPENDECTOMY  1967  . BOTOX INJECTION     "face"  . CATARACT EXTRACTION W/ INTRAOCULAR LENS IMPLANT Bilateral 2000s  . [place]; 1975  . COLONOSCOPY W/ BIOPSIES AND POLYPECTOMY  "a few"  . DILATION AND CURETTAGE OF UTERUS    . HYSTEROSCOPY W/D&C  08/29/2006   and resection of endometrial polyp/notes 09/10/2010  . JOINT REPLACEMENT    . KNEE ARTHROSCOPY Bilateral   . NASAL SEPTUM SURGERY  1972  . OOPHORECTOMY Right 1994  . TOTAL KNEE ARTHROPLASTY  08/05/2011   Procedure: TOTAL KNEE ARTHROPLASTY;  Surgeon: Gearlean Alf, MD;  Location: [facility];  Service: Orthopedics;  Laterality: Right;  . TOTAL KNEE ARTHROPLASTY Left 05/2009  . TUBAL LIGATION  1975    SOCIAL HISTORY: Social History   Tobacco Use  . Smoking status: Former Smoker    Packs/day: 0.12    Years: 36.00    Pack years: 4.32    Types: Cigarettes    Last attempt to quit: 06/19/2014    Years since quitting: 3.6  . Smokeless tobacco: Never Used  Substance Use Topics  . Alcohol use: Yes    Alcohol/week: 21.0 standard drinks    Types: 21 Glasses of wine per week    Comment: 04/24/2017 "3, glasses of white wine qd"  . Drug use: No    FAMILY HISTORY: Family History  Problem Relation Age of Onset  . Heart disease Mother         Rheumatic heart disease  . Pulmonary embolism Mother   . Heart disease Father        valvular heart disease  . Stroke Father   . Hypertension Sister   . Colon cancer Neg Hx   . Esophageal cancer Neg Hx   . Rectal cancer Neg Hx   . Stomach cancer Neg Hx   . Pancreatic cancer Neg Hx     ROS: Review of Systems  Constitutional: Positive for malaise/fatigue. Negative for weight loss.  Eyes:       + Wear glasses or contacts + Floaters  Respiratory: Positive for shortness of breath (with exertion).   Cardiovascular: Negative for chest pain and orthopnea.       Negative chest pressure  Gastrointestinal:  Negative for nausea and vomiting.  Genitourinary: Positive for frequency.  Musculoskeletal: Positive for back pain.       Negative muscle weakness + Muscle or joint pain  Skin: Positive for itching.  Neurological: Negative for headaches.  Endo/Heme/Allergies: Bruises/bleeds easily.  Psychiatric/Behavioral: Positive for depression. Negative for suicidal ideas.    PHYSICAL EXAM: Blood pressure 123/81, pulse 68, temperature (!) 97.4 F (36.3 C), temperature source Oral, height 5\' 1"  (1.549 m), weight 225 lb (102.1 kg), SpO2 95 %. Body mass index is 42.51 kg/m. Physical Exam  Constitutional: She is oriented to person, place, and time. She appears well-developed and well-nourished.  HENT:  Head: Normocephalic and atraumatic.  Nose: Nose normal.  Eyes: EOM are normal. No scleral icterus.  Neck: Normal range of motion. Neck supple. No thyromegaly present.  Cardiovascular: Normal rate and regular rhythm.  Pulmonary/Chest: Effort normal. No respiratory distress.  Abdominal: Soft. There is no tenderness.  + Obesity  Musculoskeletal:  Range of Motion normal in all 4 extremities Trace edema noted in bilateral lower extremities  Neurological: She is alert and oriented to person, place, and time. Coordination normal.  Skin: Skin is warm and dry.  Psychiatric: She has a normal mood and  affect. Her behavior is normal.  Vitals reviewed.   RECENT LABS AND TESTS: BMET    Component Value Date/Time   NA 136 05/01/2017 0727   NA 140 12/06/2016 1219   K 4.3 05/01/2017 0727   CL 105 05/01/2017 0727   CO2 23 05/01/2017 0727   GLUCOSE 187 (H) 05/01/2017 0727   BUN 20 05/01/2017 0727   BUN 32 (H) 12/06/2016 1219   CREATININE 0.96 05/01/2017 0727   CALCIUM 8.2 (L) 05/01/2017 0727   GFRNONAA 59 (L) 05/01/2017 0727   GFRAA >60 05/01/2017 0727   No results found for: HGBA1C No results found for: INSULIN CBC    Component Value Date/Time   WBC 6.9 04/30/2017 0341   RBC 4.06 04/30/2017 0341   HGB 13.4 04/30/2017 0341   HCT 40.5 04/30/2017 0341   PLT 180 04/30/2017 0341   MCV 99.8 04/30/2017 0341   MCV 98.5 (A) 10/04/2016 1817   MCH 33.0 04/30/2017 0341   MCHC 33.1 04/30/2017 0341   RDW 12.3 04/30/2017 0341   LYMPHSABS 1.2 04/30/2017 0341   MONOABS 0.6 04/30/2017 0341   EOSABS 0.0 04/30/2017 0341   BASOSABS 0.0 04/30/2017 0341   Iron/TIBC/Ferritin/ %Sat No results found for: IRON, TIBC, FERRITIN, IRONPCTSAT Lipid Panel  No results found for: CHOL, TRIG, HDL, CHOLHDL, VLDL, LDLCALC, LDLDIRECT Hepatic Function Panel     Component Value Date/Time   PROT 6.1 (L) 04/30/2017 0341   ALBUMIN 3.2 (L) 04/30/2017 0341   AST 38 04/30/2017 0341   ALT 116 (H) 04/30/2017 0341   ALKPHOS 60 04/30/2017 0341   BILITOT 0.8 04/30/2017 0341      Component Value Date/Time   TSH 1.210 07/31/2016 1226    ECG  shows NSR with a rate of 72 BPM INDIRECT CALORIMETER done today shows a VO2 of 247 and a REE of 1720.  Her calculated basal metabolic rate is 9622 thus her basal metabolic rate is better than expected.    ASSESSMENT AND PLAN: Other fatigue - Plan: EKG 12-Lead, Comprehensive metabolic panel, CBC With Differential, Hemoglobin A1c, Insulin, random, Lipid Panel With LDL/HDL Ratio, T3, T4, free, TSH  Shortness of breath on exertion  Essential hypertension  Vitamin D  deficiency - Plan: VITAMIN D 25 Hydroxy (Vit-D Deficiency, Fractures)  Depression  screening  Class 3 severe obesity with serious comorbidity and body mass index (BMI) of 40.0 to 44.9 in adult, unspecified obesity type [facility])  PLAN:  Fatigue Kassaundra was informed that her fatigue may be related to obesity, depression or many other causes. Labs will be ordered, and in the meanwhile Temesha has agreed to work on diet, exercise and weight loss to help with fatigue. Proper sleep hygiene was discussed including the need for 7-8 hours of quality sleep each night. A sleep study was not ordered based on symptoms and Epworth score.  Dyspnea on exertion Jersee's shortness of breath appears to be obesity related and exercise induced. She has agreed to work on weight loss and gradually increase exercise to treat her exercise induced shortness of breath. If Azarria follows our instructions and loses weight without improvement of her shortness of breath, we will plan to refer to pulmonology. We will monitor this condition regularly. Antonisha agrees to this plan.  Hypertension We discussed sodium restriction, working on healthy weight loss, and a regular exercise program as the means to achieve improved blood pressure control. Roneshia agreed with this plan and agreed to follow up as directed. We will continue to monitor her blood pressure as well as her progress with the above lifestyle modifications. Iola agrees to continue her medications as prescribed and will watch for signs of hypotension as she continues her lifestyle modifications. We will check labs today and Shenea agrees to follow up with our clinic in 2 weeks.  Vitamin D Deficiency Amanie was informed that low vitamin D levels contributes to fatigue and are associated with obesity, breast, and colon cancer. She will follow up for routine testing of vitamin D, at least 2-3 times per year. She was informed of the risk of over-replacement of vitamin D  and agrees to not increase her dose unless she discusses this with [place] first. We will check labs today and Iniya agrees to follow up with our clinic in 2 weeks.  Depression Screen Keeanna had a strongly positive depression screening. Depression is commonly associated with obesity and often results in emotional eating behaviors. We will monitor this closely and work on CBT to help improve the non-hunger eating patterns. Referral to Psychology may be required if no improvement is seen as she continues in our clinic.  Obesity Titilayo is currently in the action stage of change and her goal is to continue with weight loss efforts. I recommend Toni begin the structured treatment plan as follows:  She has agreed to follow the Category 2 plan + 100 calories + 3 eggs and yogurt if needed Ceri has been instructed to eventually work up to a goal of 150 minutes of combined cardio and strengthening exercise per week for weight loss and overall health benefits. We discussed the following Behavioral Modification Strategies today: increasing lean protein intake, increasing vegetables, work on meal planning and easy cooking plans, and planning for success   She was informed of the importance of frequent follow up visits to maximize her success with intensive lifestyle modifications for her multiple health conditions. She was informed we would discuss her lab results at her next visit unless there is a critical issue that needs to be addressed sooner. Walaa agreed to keep her next visit at the agreed upon time to discuss these results.    OBESITY BEHAVIORAL INTERVENTION VISIT  Today's visit was # 1   Starting weight: 225 lbs Starting date: 02/18/18 Today's weight : 225 lbs  Today's date: 02/18/2018  Total lbs lost to date: 0 At least 15 minutes were spent on discussing the following behavioral intervention visit.   ASK: We discussed the diagnosis of obesity with Clabe Seal today and Earlie Server  agreed to give [place] permission to discuss obesity behavioral modification therapy today.  ASSESS: Jady has the diagnosis of obesity and her BMI today is 42.54 Lamae is in the action stage of change   ADVISE: Jontavia was educated on the multiple health risks of obesity as well as the benefit of weight loss to improve her health. She was advised of the need for long term treatment and the importance of lifestyle modifications to improve her current health and to decrease her risk of future health problems.  AGREE: Multiple dietary modification options and treatment options were discussed and  Javia agreed to follow the recommendations documented in the above note.  ARRANGE: Maille was educated on the importance of frequent visits to treat obesity as outlined per CMS and USPSTF guidelines and agreed to schedule her next follow up appointment today.  I, Trixie Dredge, am acting as transcriptionist for Ilene Qua, MD  I have reviewed the above documentation for accuracy and completeness, and I agree with the above. - Ilene Qua, MD

## 2018-02-19 LAB — CBC WITH DIFFERENTIAL
BASOS ABS: 0.1 10*3/uL (ref 0.0–0.2)
Basos: 1 %
EOS (ABSOLUTE): 0.2 10*3/uL (ref 0.0–0.4)
Eos: 3 %
Hematocrit: 41.1 % (ref 34.0–46.6)
Hemoglobin: 14.1 g/dL (ref 11.1–15.9)
IMMATURE GRANS (ABS): 0 10*3/uL (ref 0.0–0.1)
Immature Granulocytes: 1 %
LYMPHS: 27 %
Lymphocytes Absolute: 1.7 10*3/uL (ref 0.7–3.1)
MCH: 33.9 pg — AB (ref 26.6–33.0)
MCHC: 34.3 g/dL (ref 31.5–35.7)
MCV: 99 fL — ABNORMAL HIGH (ref 79–97)
MONOS ABS: 0.8 10*3/uL (ref 0.1–0.9)
Monocytes: 12 %
NEUTROS ABS: 3.5 10*3/uL (ref 1.4–7.0)
NEUTROS PCT: 56 %
RBC: 4.16 x10E6/uL (ref 3.77–5.28)
RDW: 12.1 % — AB (ref 12.3–15.4)
WBC: 6.2 10*3/uL (ref 3.4–10.8)

## 2018-02-19 LAB — HEMOGLOBIN A1C
Est. average glucose Bld gHb Est-mCnc: 108 mg/dL
HEMOGLOBIN A1C: 5.4 % (ref 4.8–5.6)

## 2018-02-19 LAB — LIPID PANEL WITH LDL/HDL RATIO
CHOLESTEROL TOTAL: 254 mg/dL — AB (ref 100–199)
HDL: 101 mg/dL (ref >39)
LDL CALC: 122 mg/dL — AB (ref 0–99)
LDl/HDL Ratio: 1.2 ratio (ref 0.0–3.2)
Triglycerides: 153 mg/dL — ABNORMAL HIGH (ref 0–149)
VLDL CHOLESTEROL CAL: 31 mg/dL (ref 5–40)

## 2018-02-19 LAB — COMPREHENSIVE METABOLIC PANEL
A/G RATIO: 1.7 (ref 1.2–2.2)
ALBUMIN: 4.4 g/dL (ref 3.5–4.8)
ALT: 45 IU/L — ABNORMAL HIGH (ref 0–32)
AST: 26 IU/L (ref 0–40)
Alkaline Phosphatase: 80 IU/L (ref 39–117)
BUN / CREAT RATIO: 32 — AB (ref 12–28)
BUN: 30 mg/dL — ABNORMAL HIGH (ref 8–27)
Bilirubin Total: 0.5 mg/dL (ref 0.0–1.2)
CO2: 22 mmol/L (ref 20–29)
CREATININE: 0.94 mg/dL (ref 0.57–1.00)
Calcium: 9.2 mg/dL (ref 8.7–10.3)
Chloride: 102 mmol/L (ref 96–106)
GFR calc Af Amer: 71 mL/min/{1.73_m2} (ref >59)
GFR calc non Af Amer: 62 mL/min/{1.73_m2} (ref >59)
GLOBULIN, TOTAL: 2.6 g/dL (ref 1.5–4.5)
Glucose: 102 mg/dL — ABNORMAL HIGH (ref 65–99)
POTASSIUM: 4.7 mmol/L (ref 3.5–5.2)
Sodium: 139 mmol/L (ref 134–144)
Total Protein: 7 g/dL (ref 6.0–8.5)

## 2018-02-19 LAB — VITAMIN D 25 HYDROXY (VIT D DEFICIENCY, FRACTURES): Vit D, 25-Hydroxy: 26.2 ng/mL — ABNORMAL LOW (ref 30.0–100.0)

## 2018-02-19 LAB — INSULIN, RANDOM: INSULIN: 10.5 u[IU]/mL (ref 2.6–24.9)

## 2018-02-19 LAB — T3: T3 TOTAL: 98 ng/dL (ref 71–180)

## 2018-02-19 LAB — T4, FREE: FREE T4: 1.34 ng/dL (ref 0.82–1.77)

## 2018-02-19 LAB — TSH: TSH: 1.58 u[IU]/mL (ref 0.450–4.500)

## 2018-02-20 DIAGNOSIS — Z8719 Personal history of other diseases of the digestive system: Secondary | ICD-10-CM

## 2018-02-20 DIAGNOSIS — Z23 Encounter for immunization: Secondary | ICD-10-CM | POA: Diagnosis not present

## 2018-02-20 HISTORY — DX: Personal history of other diseases of the digestive system: Z87.19

## 2018-02-23 ENCOUNTER — Emergency Department (HOSPITAL_COMMUNITY): Payer: Medicare Other

## 2018-02-23 ENCOUNTER — Encounter (HOSPITAL_COMMUNITY): Payer: Self-pay | Admitting: Emergency Medicine

## 2018-02-23 ENCOUNTER — Emergency Department (HOSPITAL_COMMUNITY)
Admission: EM | Admit: 2018-02-23 | Discharge: 2018-02-23 | Disposition: A | Discharge status: Home / Self Care | Payer: Medicare Other | Attending: Emergency Medicine | Admitting: Emergency Medicine

## 2018-02-23 DIAGNOSIS — I10 Essential (primary) hypertension: Secondary | ICD-10-CM | POA: Diagnosis not present

## 2018-02-23 DIAGNOSIS — Z87891 Personal history of nicotine dependence: Secondary | ICD-10-CM | POA: Diagnosis not present

## 2018-02-23 DIAGNOSIS — R1032 Left lower quadrant pain: Secondary | ICD-10-CM | POA: Insufficient documentation

## 2018-02-23 DIAGNOSIS — R1030 Lower abdominal pain, unspecified: Secondary | ICD-10-CM

## 2018-02-23 DIAGNOSIS — Z79899 Other long term (current) drug therapy: Secondary | ICD-10-CM | POA: Insufficient documentation

## 2018-02-23 DIAGNOSIS — K5730 Diverticulosis of large intestine without perforation or abscess without bleeding: Secondary | ICD-10-CM | POA: Diagnosis not present

## 2018-02-23 LAB — URINALYSIS, ROUTINE W REFLEX MICROSCOPIC
Bilirubin Urine: NEGATIVE
GLUCOSE, UA: NEGATIVE mg/dL
Hgb urine dipstick: NEGATIVE
KETONES UR: 5 mg/dL — AB
LEUKOCYTES UA: NEGATIVE
Nitrite: NEGATIVE
PH: 5 (ref 5.0–8.0)
PROTEIN: NEGATIVE mg/dL
Specific Gravity, Urine: 1.024 (ref 1.005–1.030)

## 2018-02-23 LAB — CBC
HEMATOCRIT: 40.4 % (ref 36.0–46.0)
Hemoglobin: 13.5 g/dL (ref 12.0–15.0)
MCH: 33.6 pg (ref 26.0–34.0)
MCHC: 33.4 g/dL (ref 30.0–36.0)
MCV: 100.5 fL — AB (ref 80.0–100.0)
NRBC: 0 % (ref 0.0–0.2)
Platelets: 199 10*3/uL (ref 150–400)
RBC: 4.02 MIL/uL (ref 3.87–5.11)
RDW: 11.7 % (ref 11.5–15.5)
WBC: 6.9 10*3/uL (ref 4.0–10.5)

## 2018-02-23 LAB — COMPREHENSIVE METABOLIC PANEL
ALT: 54 U/L — ABNORMAL HIGH (ref 0–44)
AST: 36 U/L (ref 15–41)
Albumin: 4.4 g/dL (ref 3.5–5.0)
Alkaline Phosphatase: 70 U/L (ref 38–126)
Anion gap: 11 (ref 5–15)
BUN: 39 mg/dL — ABNORMAL HIGH (ref 8–23)
CHLORIDE: 105 mmol/L (ref 98–111)
CO2: 23 mmol/L (ref 22–32)
Calcium: 9.1 mg/dL (ref 8.9–10.3)
Creatinine, Ser: 1.31 mg/dL — ABNORMAL HIGH (ref 0.44–1.00)
GFR, EST AFRICAN AMERICAN: 47 mL/min — AB (ref >60)
GFR, EST NON AFRICAN AMERICAN: 40 mL/min — AB (ref >60)
Glucose, Bld: 104 mg/dL — ABNORMAL HIGH (ref 70–99)
POTASSIUM: 4.3 mmol/L (ref 3.5–5.1)
SODIUM: 139 mmol/L (ref 135–145)
Total Bilirubin: 0.9 mg/dL (ref 0.3–1.2)
Total Protein: 7.7 g/dL (ref 6.5–8.1)

## 2018-02-23 LAB — LIPASE, BLOOD: LIPASE: 49 U/L (ref 11–51)

## 2018-02-23 MED ORDER — CIPROFLOXACIN HCL 500 MG PO TABS: 500.0000 mg | ORAL_TABLET | Freq: Two times a day (BID) | ORAL | 0 refills | Status: DC

## 2018-02-23 MED ORDER — IOPAMIDOL (ISOVUE-300) INJECTION 61%
100.0000 mL | Freq: Once | INTRAVENOUS | Status: AC | PRN
  Administered 2018-02-23: 80 mL via INTRAVENOUS

## 2018-02-23 MED ORDER — SODIUM CHLORIDE 0.9 % IJ SOLN
INTRAMUSCULAR | Status: AC
  Filled 2018-02-23: qty 50

## 2018-02-23 MED ORDER — METRONIDAZOLE 500 MG PO TABS: 500.0000 mg | ORAL_TABLET | Freq: Two times a day (BID) | ORAL | 0 refills | Status: DC

## 2018-02-23 MED ORDER — ONDANSETRON 4 MG PO TBDP: ORAL_TABLET | ORAL | 0 refills | Status: DC

## 2018-02-23 MED ORDER — TRAMADOL HCL 50 MG PO TABS: 50.0000 mg | ORAL_TABLET | Freq: Four times a day (QID) | ORAL | 0 refills | Status: DC | PRN

## 2018-02-23 MED ORDER — ONDANSETRON 4 MG PO TBDP
4.0000 mg | ORAL_TABLET | Freq: Once | ORAL | Status: AC
  Administered 2018-02-23: 4 mg via ORAL
  Filled 2018-02-23: qty 1

## 2018-02-23 MED ORDER — IOPAMIDOL (ISOVUE-300) INJECTION 61%
INTRAVENOUS | Status: AC
  Filled 2018-02-23: qty 100

## 2018-02-23 MED ORDER — CIPROFLOXACIN HCL 500 MG PO TABS
500.0000 mg | ORAL_TABLET | Freq: Once | ORAL | Status: AC
  Administered 2018-02-23: 500 mg via ORAL
  Filled 2018-02-23: qty 1

## 2018-02-23 MED ORDER — METRONIDAZOLE 500 MG PO TABS
500.0000 mg | ORAL_TABLET | Freq: Once | ORAL | Status: AC
  Administered 2018-02-23: 500 mg via ORAL
  Filled 2018-02-23: qty 1

## 2018-02-23 NOTE — ED Provider Notes (Signed)
[facility] DEPT Provider Note   CSN: [id number] Arrival date & time: 02/23/18  1344     History   Chief Complaint Chief Complaint  Patient presents with  . Abdominal Pain    HPI Michelle [name] is a 72 y.o. female.  Patient complains of lower abdominal pain and some nausea.  Patient has a history of diverticulitis  The history is provided by the patient. No language interpreter was used.  Abdominal Pain   This is a new problem. The current episode started more than 2 days ago. The problem occurs constantly. The problem has not changed since onset.The pain is associated with an unknown factor. The pain is located in the LLQ. The quality of the pain is aching. The pain is at a severity of 5/10. The pain is moderate. Pertinent negatives include anorexia, diarrhea, frequency, hematuria and headaches. Nothing relieves the symptoms.    Past Medical History:  Diagnosis Date  . Anxiety   . Arthritis    "back" (04/24/2017)  . 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 Chondromalacia of knee    right knee  . Chronic bronchitis ([place])   . Chronic lower back pain   . DDD (degenerative disc disease) 07-24-11   Osteoarthritis-s/p LTKA 2'11, now right planned  . Depression 07-24-11   tx. depression only  . Fatty liver   . Hx of adenomatous polyp of colon 03/21/2014  . Hyperlipidemia   . Hypertension   . IBS (irritable bowel syndrome)   . OSA on CPAP 12/07/2016  . Pneumonia 1990s X 1  . SOB (shortness of breath)   . Vitamin D deficiency     Patient Active Problem List   Diagnosis Date Noted  . Cough 05/14/2017  . ETOH abuse 04/27/2017  . Abnormal LFTs 04/26/2017  . Thrombocytopenia ([place]) 04/26/2017  . Acute hypoxemic respiratory failure ([place]) 04/24/2017  . Acute bronchitis 04/24/2017  . Dyspnea on exertion 03/18/2017  . Morbid obesity ([place]) 03/18/2017  . Obstructive sleep apnea 12/07/2016  . Essential hypertension  12/07/2016  . Hx of adenomatous polyp of colon 03/21/2014  . Postop Hyponatremia 08/07/2011  . OA (osteoarthritis) of knee 08/05/2011  . Pain in limb 05/15/2011    Past Surgical History:  Procedure Laterality Date  . APPENDECTOMY  1967  . BOTOX INJECTION     "face"  . CATARACT EXTRACTION W/ INTRAOCULAR LENS IMPLANT Bilateral 2000s  . [place]; 1975  . COLONOSCOPY W/ BIOPSIES AND POLYPECTOMY  "a few"  . DILATION AND CURETTAGE OF UTERUS    . HYSTEROSCOPY W/D&C  08/29/2006   and resection of endometrial polyp/notes 09/10/2010  . JOINT REPLACEMENT    . KNEE ARTHROSCOPY Bilateral   . NASAL SEPTUM SURGERY  1972  . OOPHORECTOMY Right 1994  . TOTAL KNEE ARTHROPLASTY  08/05/2011   Procedure: TOTAL KNEE ARTHROPLASTY;  Surgeon: Gearlean Alf, MD;  Location: [facility];  Service: Orthopedics;  Laterality: Right;  . TOTAL KNEE ARTHROPLASTY Left 05/2009  . TUBAL LIGATION  1975     OB History    Gravida  2   Para  2   Term      Preterm      AB      Living  2     SAB      TAB      Ectopic      Multiple      Live Births  Home Medications    Prior to Admission medications   Medication Sig Start Date End Date Taking? Authorizing Provider  losartan (COZAAR) 100 MG tablet Take 100 mg by mouth daily.  11/27/17  Yes [provider]  sertraline (ZOLOFT) 100 MG tablet Take 200 mg by mouth daily.  03/07/17  Yes [provider]  ciprofloxacin (CIPRO) 500 MG tablet Take 1 tablet (500 mg total) by mouth 2 (two) times daily. 02/23/18   Milton Ferguson, MD  metroNIDAZOLE (FLAGYL) 500 MG tablet Take 1 tablet (500 mg total) by mouth 2 (two) times daily. One po bid x 7 days 02/23/18   Milton Ferguson, MD  ondansetron (ZOFRAN ODT) 4 MG disintegrating tablet 4mg  ODT q4 hours prn nausea/vomit 02/23/18   Milton Ferguson, MD    Family History Family History  Problem Relation Age of Onset  . Heart disease Mother        Rheumatic heart disease  .  Pulmonary embolism Mother   . Heart disease Father        valvular heart disease  . Stroke Father   . Hypertension Sister   . Colon cancer Neg Hx   . Esophageal cancer Neg Hx   . Rectal cancer Neg Hx   . Stomach cancer Neg Hx   . Pancreatic cancer Neg Hx     Social History Social History   Tobacco Use  . Smoking status: Former Smoker    Packs/day: 0.12    Years: 36.00    Pack years: 4.32    Types: Cigarettes    Last attempt to quit: 06/19/2014    Years since quitting: 3.6  . Smokeless tobacco: Never Used  Substance Use Topics  . Alcohol use: Yes    Alcohol/week: 21.0 standard drinks    Types: 21 Glasses of wine per week    Comment: 04/24/2017 "3, glasses of white wine qd"  . Drug use: No     Allergies   Patient has no known allergies.   Review of Systems Review of Systems  Constitutional: Negative for appetite change and fatigue.  HENT: Negative for congestion, ear discharge and sinus pressure.   Eyes: Negative for discharge.  Respiratory: Negative for cough.   Cardiovascular: Negative for chest pain.  Gastrointestinal: Positive for abdominal pain. Negative for anorexia and diarrhea.  Genitourinary: Negative for frequency and hematuria.  Musculoskeletal: Negative for back pain.  Skin: Negative for rash.  Neurological: Negative for seizures and headaches.  Psychiatric/Behavioral: Negative for hallucinations.     Physical Exam Updated Vital Signs BP (!) 149/76 (BP Location: Left Arm)   Pulse 80   Temp 98.4 F (36.9 C) (Oral)   Resp 18   Ht 5\' 1"  (1.549 m)   Wt 102.7 kg   SpO2 95%   BMI 42.77 kg/m   Physical Exam  Constitutional: She is oriented to person, place, and time. She appears well-developed.  HENT:  Head: Normocephalic.  Eyes: Conjunctivae and EOM are normal. No scleral icterus.  Neck: Neck supple. No thyromegaly present.  Cardiovascular: Normal rate and regular rhythm. Exam reveals no gallop and no friction rub.  No murmur  heard. Pulmonary/Chest: No stridor. She has no wheezes. She has no rales. She exhibits no tenderness.  Abdominal: She exhibits no distension. There is tenderness. There is no rebound.  Musculoskeletal: Normal range of motion. She exhibits no edema.  Lymphadenopathy:    She has no cervical adenopathy.  Neurological: She is oriented to person, place, and time. She exhibits normal muscle tone.  Coordination normal.  Skin: No rash noted. No erythema.  Psychiatric: She has a normal mood and affect. Her behavior is normal.     ED Treatments / Results  Labs (all labs ordered are listed, but only abnormal results are displayed) Labs Reviewed  COMPREHENSIVE METABOLIC PANEL - Abnormal; Notable for the following components:      Result Value   Glucose, Bld 104 (*)    BUN 39 (*)    Creatinine, Ser 1.31 (*)    ALT 54 (*)    GFR calc non Af Amer 40 (*)    GFR calc Af Amer 47 (*)    All other components within normal limits  CBC - Abnormal; Notable for the following components:   MCV 100.5 (*)    All other components within normal limits  URINALYSIS, ROUTINE W REFLEX MICROSCOPIC - Abnormal; Notable for the following components:   APPearance HAZY (*)    Ketones, ur 5 (*)    All other components within normal limits  LIPASE, BLOOD    EKG None  Radiology Ct Abdomen Pelvis W Contrast  Result Date: 02/23/2018 CLINICAL DATA:  Diverticulitis with diarrhea. EXAM: CT ABDOMEN AND PELVIS WITH CONTRAST TECHNIQUE: Multidetector CT imaging of the abdomen and pelvis was performed using the standard protocol following bolus administration of intravenous contrast. CONTRAST:  61mL ISOVUE-300 IOPAMIDOL (ISOVUE-300) INJECTION 61% COMPARISON:  03/05/2016 FINDINGS: Lower chest: Bronchiectasis and subpleural reticulation noted posterior right lower lobe. Hepatobiliary: Stable 2.7 cm low-density lesion lateral segment left liver, compatible with cyst. The liver shows diffusely decreased attenuation suggesting  steatosis. There is no evidence for gallstones, gallbladder wall thickening, or pericholecystic fluid. No intrahepatic or extrahepatic biliary dilation. Pancreas: No focal mass lesion. No dilatation of the main duct. No intraparenchymal cyst. No peripancreatic edema. Spleen: No splenomegaly. No focal mass lesion. Adrenals/Urinary Tract: No adrenal nodule or mass. Kidneys unremarkable. No evidence for hydroureter. Bladder is decompressed. Stomach/Bowel: Stomach is nondistended. No gastric wall thickening. No evidence of outlet obstruction. Duodenum is normally positioned as is the ligament of Treitz. No small bowel wall thickening. No small bowel dilatation. The terminal ileum is normal. The appendix is not visualized, but there is no edema or inflammation in the region of the cecum. Diverticular changes are seen in the left colon in there is some mild pericolonic edema/inflammation adjacent to the mid sigmoid segment. While the appearance of the para colonic edema is suggestive of epiploic appendagitis, the pattern is similar to the study from 2 years ago and diverticulitis remains a possibility. No evidence for extraluminal gas or para colonic abscess. Vascular/Lymphatic: No abdominal aortic aneurysm. There is no gastrohepatic or hepatoduodenal ligament lymphadenopathy. No intraperitoneal or retroperitoneal lymphadenopathy. No pelvic sidewall lymphadenopathy. Reproductive: The uterus has normal CT imaging appearance. There is no adnexal mass. Other: No intraperitoneal free fluid. Musculoskeletal: No worrisome lytic or sclerotic osseous abnormality. IMPRESSION: 1. Mild pericolonic edema/inflammation in the mid sigmoid colon with a background of left colonic diverticulosis. The appearance of the edema suggests epiploic appendagitis although sigmoid diverticulitis is also a possibility. No extraluminal gas or para colonic abscess. 2. Hepatic steatosis. 3.  Aortic Atherosclerois (ICD10-170.0) Electronically Signed    By: Misty Stanley M.D.   On: 02/23/2018 19:38    Procedures Procedures (including critical care time)  Medications Ordered in ED Medications  iopamidol (ISOVUE-300) 61 % injection (has no administration in time range)  sodium chloride 0.9 % injection (has no administration in time range)  ondansetron (ZOFRAN-ODT) disintegrating tablet 4 mg (has  no administration in time range)  ciprofloxacin (CIPRO) tablet 500 mg (has no administration in time range)  metroNIDAZOLE (FLAGYL) tablet 500 mg (has no administration in time range)  iopamidol (ISOVUE-300) 61 % injection 100 mL (80 mLs Intravenous Contrast Given 02/23/18 1908)     Initial Impression / Assessment and Plan / ED Course  I have reviewed the triage vital signs and the nursing notes.  Pertinent labs & imaging results that were available during my care of the patient were reviewed by me and considered in my medical decision making (see chart for details).    Patient CT shows possible diverticulitis she will be treated with antibiotics and follow-up with her PCP  Final Clinical Impressions(s) / ED Diagnoses   Final diagnoses:  Lower abdominal pain    ED Discharge Orders         Ordered    ciprofloxacin (CIPRO) 500 MG tablet  2 times daily     02/23/18 1958    metroNIDAZOLE (FLAGYL) 500 MG tablet  2 times daily     02/23/18 1958    ondansetron (ZOFRAN ODT) 4 MG disintegrating tablet     02/23/18 1958           Milton Ferguson, MD 02/23/18 2002

## 2018-02-23 NOTE — ED Notes (Signed)
Patient transported to CT 

## 2018-02-23 NOTE — ED Triage Notes (Signed)
Pt c/o diverticulitis flare up since last Tuesday with diarrhea and constipation.

## 2018-02-23 NOTE — Discharge Instructions (Addendum)
Follow-up with your doctor this week for recheck. 

## 2018-03-02 DIAGNOSIS — R7989 Other specified abnormal findings of blood chemistry: Secondary | ICD-10-CM | POA: Diagnosis not present

## 2018-03-02 DIAGNOSIS — N289 Disorder of kidney and ureter, unspecified: Secondary | ICD-10-CM | POA: Diagnosis not present

## 2018-03-02 DIAGNOSIS — K5792 Diverticulitis of intestine, part unspecified, without perforation or abscess without bleeding: Secondary | ICD-10-CM | POA: Diagnosis not present

## 2018-03-04 DIAGNOSIS — Z01419 Encounter for gynecological examination (general) (routine) without abnormal findings: Secondary | ICD-10-CM | POA: Diagnosis not present

## 2018-03-04 DIAGNOSIS — Z1231 Encounter for screening mammogram for malignant neoplasm of breast: Secondary | ICD-10-CM | POA: Diagnosis not present

## 2018-03-04 DIAGNOSIS — Z6841 Body Mass Index (BMI) 40.0 and over, adult: Secondary | ICD-10-CM | POA: Diagnosis not present

## 2018-03-05 ENCOUNTER — Ambulatory Visit (INDEPENDENT_AMBULATORY_CARE_PROVIDER_SITE_OTHER): Payer: Medicare Other | Admitting: Family Medicine

## 2018-03-05 VITALS — BP 106/71 | HR 67 | Temp 97.5°F | Ht 61.0 in | Wt 222.0 lb

## 2018-03-05 DIAGNOSIS — E559 Vitamin D deficiency, unspecified: Secondary | ICD-10-CM | POA: Diagnosis not present

## 2018-03-05 DIAGNOSIS — E8881 Metabolic syndrome: Secondary | ICD-10-CM | POA: Diagnosis not present

## 2018-03-05 DIAGNOSIS — E7849 Other hyperlipidemia: Secondary | ICD-10-CM

## 2018-03-05 DIAGNOSIS — Z6841 Body Mass Index (BMI) 40.0 and over, adult: Secondary | ICD-10-CM | POA: Diagnosis not present

## 2018-03-05 MED ORDER — VITAMIN D (ERGOCALCIFEROL) 1.25 MG (50000 UNIT) PO CAPS: 50000.0000 [IU] | ORAL_CAPSULE | ORAL | 0 refills | Status: DC

## 2018-03-11 NOTE — Progress Notes (Signed)
Office: [phone]  /  Fax: [phone]   HPI:   Chief Complaint: OBESITY Michelle [name] is here to discuss her progress with her obesity treatment plan. She is on the  follow the Category 2 plan +100 calories and 3 eggs and yogurt if needed and is following her eating plan approximately 40 % of the time. She states she is exercising 0 minutes 0 times per week. Michelle [name] is currently struggling with lack of appetite and laziness as to why she did not eat all food on the plan. She also had a diverticulitis flare and had to go to the ED.  She later states that she did not understand the food plan and therefore did not follow it.  She cites lots of social eating pitfalls and lack of ability to creative with plan.  She voices she also has a consistent alcohol consumption that she did not understand how to incorporate into her snacks. Her weight is 222 lb (100.7 kg) today and has had a weight loss of 3 pounds over a period of 2 weeks since her last visit. She has lost 3 lbs since starting treatment with [place].  Hyperlipidemia Michelle [name] has hyperlipidemia. She has a LDL of 122 and HDL of 101 and has been trying to improve her cholesterol levels with intensive lifestyle modification including a low saturated fat diet, exercise and weight loss. She denies any chest pain, claudication or myalgias.  Vitamin D deficiency Michelle [name] has a diagnosis of vitamin D deficiency. She is not currently taking vit D and denies nausea, vomiting or muscle weakness.  Ref. Range 02/18/2018 12:17  Vitamin D, 25-Hydroxy Latest Ref Range: 30.0 - 100.0 ng/mL 26.2 (L)   Insulin Resistance Michelle [name] has a diagnosis of insulin resistance based on her elevated fasting insulin level of 10.5 and HgA1c of 5.4.. Although Michelle [name]'s blood glucose readings are still under good control, insulin resistance puts her at greater risk of metabolic syndrome and diabetes. She is not taking metformin currently and continues to work on diet and exercise to  decrease risk of diabetes.   ALLERGIES: No Known Allergies  MEDICATIONS: Current Outpatient Medications on File Prior to Visit  Medication Sig Dispense Refill  . ciprofloxacin (CIPRO) 500 MG tablet Take 1 tablet (500 mg total) by mouth 2 (two) times daily. 14 tablet 0  . losartan (COZAAR) 100 MG tablet Take 100 mg by mouth daily.   2  . metroNIDAZOLE (FLAGYL) 500 MG tablet Take 1 tablet (500 mg total) by mouth 2 (two) times daily. One po bid x 7 days 14 tablet 0  . ondansetron (ZOFRAN ODT) 4 MG disintegrating tablet 4mg  ODT q4 hours prn nausea/vomit 12 tablet 0  . sertraline (ZOLOFT) 100 MG tablet Take 200 mg by mouth daily.   0  . traMADol (ULTRAM) 50 MG tablet Take 1 tablet (50 mg total) by mouth every 6 (six) hours as needed. 15 tablet 0   No current facility-administered medications on file prior to visit.     PAST MEDICAL HISTORY: Past Medical History:  Diagnosis Date  . Anxiety   . Arthritis    "back" (04/24/2017)  . 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 Chondromalacia of knee    right knee  . Chronic bronchitis ([place])   . Chronic lower back pain   . DDD (degenerative disc disease) 07-24-11   Osteoarthritis-s/p LTKA 2'11, now right planned  . Depression 07-24-11   tx. depression only  .  Fatty liver   . Hx of adenomatous polyp of colon 03/21/2014  . Hyperlipidemia   . Hypertension   . IBS (irritable bowel syndrome)   . OSA on CPAP 12/07/2016  . Pneumonia 1990s X 1  . SOB (shortness of breath)   . Vitamin D deficiency     PAST SURGICAL HISTORY: Past Surgical History:  Procedure Laterality Date  . APPENDECTOMY  1967  . BOTOX INJECTION     "face"  . CATARACT EXTRACTION W/ INTRAOCULAR LENS IMPLANT Bilateral 2000s  . [place]; 1975  . COLONOSCOPY W/ BIOPSIES AND POLYPECTOMY  "a few"  . DILATION AND CURETTAGE OF UTERUS    . HYSTEROSCOPY W/D&C  08/29/2006   and resection of endometrial polyp/notes 09/10/2010  . JOINT  REPLACEMENT    . KNEE ARTHROSCOPY Bilateral   . NASAL SEPTUM SURGERY  1972  . OOPHORECTOMY Right 1994  . TOTAL KNEE ARTHROPLASTY  08/05/2011   Procedure: TOTAL KNEE ARTHROPLASTY;  Surgeon: Gearlean Alf, MD;  Location: [facility];  Service: Orthopedics;  Laterality: Right;  . TOTAL KNEE ARTHROPLASTY Left 05/2009  . TUBAL LIGATION  1975    SOCIAL HISTORY: Social History   Tobacco Use  . Smoking status: Former Smoker    Packs/day: 0.12    Years: 36.00    Pack years: 4.32    Types: Cigarettes    Last attempt to quit: 06/19/2014    Years since quitting: 3.7  . Smokeless tobacco: Never Used  Substance Use Topics  . Alcohol use: Yes    Alcohol/week: 21.0 standard drinks    Types: 21 Glasses of wine per week    Comment: 04/24/2017 "3, glasses of white wine qd"  . Drug use: No    FAMILY HISTORY: Family History  Problem Relation Age of Onset  . Heart disease Mother        Rheumatic heart disease  . Pulmonary embolism Mother   . Heart disease Father        valvular heart disease  . Stroke Father   . Hypertension Sister   . Colon cancer Neg Hx   . Esophageal cancer Neg Hx   . Rectal cancer Neg Hx   . Stomach cancer Neg Hx   . Pancreatic cancer Neg Hx     ROS: Review of Systems  Constitutional: Positive for weight loss.  Cardiovascular: Negative for chest pain and claudication.  Gastrointestinal: Negative for nausea and vomiting.  Musculoskeletal: Negative for myalgias.       Negative for muscle weakness    PHYSICAL EXAM: Blood pressure 106/71, pulse 67, temperature (!) 97.5 F (36.4 C), temperature source Oral, height 5\' 1"  (1.549 m), weight 222 lb (100.7 kg), SpO2 96 %. Body mass index is 41.95 kg/m. Physical Exam  Constitutional: She is oriented to person, place, and time. She appears well-developed and well-nourished.  HENT:  Head: Normocephalic.  Neck: Normal range of motion.  Cardiovascular: Normal rate.  Pulmonary/Chest: Effort normal.  Musculoskeletal: Normal  range of motion.  Neurological: She is alert and oriented to person, place, and time.  Skin: Skin is warm and dry.  Psychiatric: She has a normal mood and affect. Her behavior is normal.  Vitals reviewed.   RECENT LABS AND TESTS: BMET    Component Value Date/Time   NA 139 02/23/2018 1424   NA 139 02/18/2018 1217   K 4.3 02/23/2018 1424   CL 105 02/23/2018 1424   CO2 23 02/23/2018 1424   GLUCOSE 104 (H) 02/23/2018 1424  BUN 39 (H) 02/23/2018 1424   BUN 30 (H) 02/18/2018 1217   CREATININE 1.31 (H) 02/23/2018 1424   CALCIUM 9.1 02/23/2018 1424   GFRNONAA 40 (L) 02/23/2018 1424   GFRAA 47 (L) 02/23/2018 1424   Lab Results  Component Value Date   HGBA1C 5.4 02/18/2018   Lab Results  Component Value Date   INSULIN 10.5 02/18/2018   CBC    Component Value Date/Time   WBC 6.9 02/23/2018 1424   RBC 4.02 02/23/2018 1424   HGB 13.5 02/23/2018 1424   HGB 14.1 02/18/2018 1217   HCT 40.4 02/23/2018 1424   HCT 41.1 02/18/2018 1217   PLT 199 02/23/2018 1424   MCV 100.5 (H) 02/23/2018 1424   MCV 99 (H) 02/18/2018 1217   MCH 33.6 02/23/2018 1424   MCHC 33.4 02/23/2018 1424   RDW 11.7 02/23/2018 1424   RDW 12.1 (L) 02/18/2018 1217   LYMPHSABS 1.7 02/18/2018 1217   MONOABS 0.6 04/30/2017 0341   EOSABS 0.2 02/18/2018 1217   BASOSABS 0.1 02/18/2018 1217   Iron/TIBC/Ferritin/ %Sat No results found for: IRON, TIBC, FERRITIN, IRONPCTSAT Lipid Panel     Component Value Date/Time   CHOL 254 (H) 02/18/2018 1217   TRIG 153 (H) 02/18/2018 1217   HDL 101 02/18/2018 1217   LDLCALC 122 (H) 02/18/2018 1217   Hepatic Function Panel     Component Value Date/Time   PROT 7.7 02/23/2018 1424   PROT 7.0 02/18/2018 1217   ALBUMIN 4.4 02/23/2018 1424   ALBUMIN 4.4 02/18/2018 1217   AST 36 02/23/2018 1424   ALT 54 (H) 02/23/2018 1424   ALKPHOS 70 02/23/2018 1424   BILITOT 0.9 02/23/2018 1424   BILITOT 0.5 02/18/2018 1217      Component Value Date/Time   TSH 1.580 02/18/2018 1217     TSH 1.210 07/31/2016 1226    Ref. Range 02/18/2018 12:17  Vitamin D, 25-Hydroxy Latest Ref Range: 30.0 - 100.0 ng/mL 26.2 (L)    ASSESSMENT AND PLAN: Other hyperlipidemia  Vitamin D deficiency - Plan: Vitamin D, Ergocalciferol, (DRISDOL) 1.25 MG (50000 UT) CAPS capsule  Insulin resistance  Class 3 severe obesity with serious comorbidity and body mass index (BMI) of 40.0 to 44.9 in adult, unspecified obesity type (HCC)  PLAN: Hyperlipidemia Brae was informed of the American Heart Association Guidelines emphasizing intensive lifestyle modifications as the first line treatment for hyperlipidemia. We discussed many lifestyle modifications today in depth, and Jolyn will continue to work on decreasing saturated fats such as fatty red meat, butter and many fried foods. She will also increase vegetables and lean protein in her diet and continue to work on exercise and weight loss efforts. We will repeat labs in 3 months.   Vitamin D Deficiency Michelle [name] was informed that low vitamin D levels contributes to fatigue and are associated with obesity, breast, and colon cancer. She agrees to start to take prescription Vit D @50 ,000 IU every week #4 with no refills and will follow up for routine testing of vitamin D, at least 2-3 times per year. She was informed of the risk of over-replacement of vitamin D and agrees to not increase her dose unless she discusses this with [place] first. Agrees to follow up with our clinic as directed.   Insulin Resistance Michelle [name] will continue to work on weight loss, exercise, and decreasing simple carbohydrates in her diet to help decrease the risk of diabetes. We dicussed metformin including benefits and risks. She was informed that eating too many simple carbohydrates  or too many calories at one sitting increases the likelihood of GI side effects. Raymond declined metformin for now and prescription was not written today. Lidiya agreed to follow up with [place] as directed to  monitor her progress. We will repeat HgA1c and Insulin in 3 months.   Obesity Shaianne is currently in the action stage of change. As such, her goal is to continue with weight loss efforts She has agreed to follow the Category 2 plan Lakindra has been instructed to work up to a goal of 150 minutes of combined cardio and strengthening exercise per week for weight loss and overall health benefits. We discussed the following Behavioral Modification Strategies today: increasing lean protein intake, no skipping meals, better snacking choices, planning for success, and decrease eating out.    Lavene has agreed to follow up with our clinic in 2 weeks. She was informed of the importance of frequent follow up visits to maximize her success with intensive lifestyle modifications for her multiple health conditions.   OBESITY BEHAVIORAL INTERVENTION VISIT  Today's visit was # 2   Starting weight: 225 lb Starting date: 02/18/18 Today's weight : Weight: 222 lb (100.7 kg)  Today's date:03/05/18 Total lbs lost to date: 3 lb At least 15 minutes were spent on discussing the following behavioral intervention visit.   ASK: We discussed the diagnosis of obesity with Clabe Seal today and Earlie Server agreed to give [place] permission to discuss obesity behavioral modification therapy today.  ASSESS: Laquanta has the diagnosis of obesity and her BMI today is 41.97 Chloe is in the action stage of change   ADVISE: Mabelle was educated on the multiple health risks of obesity as well as the benefit of weight loss to improve her health. She was advised of the need for long term treatment and the importance of lifestyle modifications to improve her current health and to decrease her risk of future health problems.  AGREE: Multiple dietary modification options and treatment options were discussed and  Karlita agreed to follow the recommendations documented in the above note.  ARRANGE: Susana was educated on  the importance of frequent visits to treat obesity as outlined per CMS and USPSTF guidelines and agreed to schedule her next follow up appointment today.  I, Renee Ramus, am acting as transcriptionist for Ilene Qua, MD   I have reviewed the above documentation for accuracy and completeness, and I agree with the above. - Ilene Qua, MD

## 2018-03-12 DIAGNOSIS — R7989 Other specified abnormal findings of blood chemistry: Secondary | ICD-10-CM | POA: Diagnosis not present

## 2018-03-17 ENCOUNTER — Encounter: Payer: Self-pay | Admitting: Nurse Practitioner

## 2018-03-17 ENCOUNTER — Ambulatory Visit: Payer: Medicare Other | Admitting: Nurse Practitioner

## 2018-03-17 VITALS — BP 114/74 | HR 80 | Ht 61.0 in | Wt 224.0 lb

## 2018-03-17 DIAGNOSIS — E7889 Other lipoprotein metabolism disorders: Secondary | ICD-10-CM

## 2018-03-17 DIAGNOSIS — K5792 Diverticulitis of intestine, part unspecified, without perforation or abscess without bleeding: Secondary | ICD-10-CM

## 2018-03-17 DIAGNOSIS — E8889 Other specified metabolic disorders: Secondary | ICD-10-CM

## 2018-03-17 NOTE — Patient Instructions (Signed)
If you are age 70 or older, your body mass index should be between 23-30. Your Body mass index is 42.32 kg/m. If this is out of the aforementioned range listed, please consider follow up with your Primary Care Provider.  If you are age 3 or younger, your body mass index should be between 19-25. Your Body mass index is 42.32 kg/m. If this is out of the aformentioned range listed, please consider follow up with your Primary Care Provider.   Call if you have any recurrent abdominal pain.  Thank you for choosing me and [facility].   Tye Savoy, NP

## 2018-03-17 NOTE — Progress Notes (Signed)
ASSESSMENT  / PLAN:   33. 72 yo female with probably recurrent diverticulitis. Recent LLQ abdominal pain reminiscent of the diverticulitis she was found to have in June 2017 and again November 2017.  CTAP on 02/23/18 suggests epiploic appendagitis vrs diverticulitis. Pain totally resolve with antibiotics.  -This was probably her 3rd episode of diverticulitis though her last episode was two years ago. She will call for any recurrent abdominal pain as this could avoid another ED visit.   2. AKi. Patient says PCP has recheck labs and renal function back to normal.   3. Mld constipation, associated with diverticulitis.  Resolved.   4. Hx of adenomatous colon polyps. She is due for 5 year surveillance colonoscopy in November 2020.   5. Steatosis. ALT mildly elevated at recent ED visit but also in January of this year. Briefly discussed fatty liver disease. Weight loss would be beneficial.      HPI:      Chief Complaint:   Recent diverticulitis.   Patient is a 72 yo female who had a screening colonoscopy November 2015 by Dr. Carlean Purl.  The exam was complete the bowel prep was excellent.  Findings included sigmoid diverticulosis and 2 diminutive tubular adenomas without HGD   Patient is referred by PCP, Dr. London Pepper. She was seen in ED 02/23/2018 with several days of LLQ pain, constipation and nausea after consuming rye bread. She subsequently remembered that her first bout of diverticulitis was after eating seeds / nuts and pain felt like diveriticulitis that she had in 2017.  She was in AKI. She was afebrile. CTAP with contrast suggested epiploic appendagitis vrs diverticulitis.  Patient was prescribed Cipro and metronidazole for 7 days, given Ultram for pain. Her pain has completely resolved.   Data Reviewed:  ALT 54 Cr 1.31 WBC 6.9 Hgb 13.5 MCV 100.5 Platelets 199 CTAP with contrast - steatosis; stable 2.7 cm low-density lesion in the left liver compatible with a  cyst.  No evidence for gallbladder disease.  In the left colon there was some mild pericolonic edema/inflammation adjacent to the mid sigmoid segment suggestive of epiploic appendagitis but diverticulitis was also possibility.     Past Medical History:  Diagnosis Date  . Anxiety   . Arthritis    "back" (04/24/2017)  . 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 Chondromalacia of knee    right knee  . Chronic bronchitis ([place])   . Chronic lower back pain   . DDD (degenerative disc disease) 07-24-11   Osteoarthritis-s/p LTKA 2'11, now right planned  . Depression 07-24-11   tx. depression only  . Fatty liver   . History of diverticulitis 02/2018  . Hx of adenomatous polyp of colon 03/21/2014  . Hyperlipidemia   . Hypertension   . IBS (irritable bowel syndrome)   . OSA on CPAP 12/07/2016  . Pneumonia 1990s X 1  . SOB (shortness of breath)   . Vitamin D deficiency      Past Surgical History:  Procedure Laterality Date  . APPENDECTOMY  1967  . BOTOX INJECTION     "face"  . CATARACT EXTRACTION W/ INTRAOCULAR LENS IMPLANT Bilateral 2000s  . [place]; 1975  . COLONOSCOPY W/ BIOPSIES AND POLYPECTOMY  "a few"  . DILATION AND CURETTAGE OF UTERUS    . HYSTEROSCOPY W/D&C  08/29/2006   and resection  of endometrial polyp/notes 09/10/2010  . JOINT REPLACEMENT    . KNEE ARTHROSCOPY Bilateral   . NASAL SEPTUM SURGERY  1972  . OOPHORECTOMY Right 1994  . TOTAL KNEE ARTHROPLASTY  08/05/2011   Procedure: TOTAL KNEE ARTHROPLASTY;  Surgeon: Gearlean Alf, MD;  Location: [facility];  Service: Orthopedics;  Laterality: Right;  . TOTAL KNEE ARTHROPLASTY Left 05/2009  . TUBAL LIGATION  1975   Family History  Problem Relation Age of Onset  . Heart disease Mother        Rheumatic heart disease  . Pulmonary embolism Mother   . Heart disease Father        valvular heart disease  . Stroke Father   . Hypertension Sister   . Colon cancer Neg Hx   .  Esophageal cancer Neg Hx   . Rectal cancer Neg Hx   . Stomach cancer Neg Hx   . Pancreatic cancer Neg Hx    Social History   Tobacco Use  . Smoking status: Former Smoker    Packs/day: 0.12    Years: 36.00    Pack years: 4.32    Types: Cigarettes    Last attempt to quit: 06/19/2014    Years since quitting: 3.7  . Smokeless tobacco: Never Used  Substance Use Topics  . Alcohol use: Yes    Alcohol/week: 21.0 standard drinks    Types: 21 Glasses of wine per week    Comment: 04/24/2017 "3, glasses of white wine qd"  . Drug use: No   Current Outpatient Medications  Medication Sig Dispense Refill  . losartan (COZAAR) 100 MG tablet Take 100 mg by mouth daily.   2  . sertraline (ZOLOFT) 100 MG tablet Take 200 mg by mouth daily.   0  . Vitamin D, Ergocalciferol, (DRISDOL) 1.25 MG (50000 UT) CAPS capsule Take 1 capsule (50,000 Units total) by mouth every 7 (seven) days. 4 capsule 0   No current facility-administered medications for this visit.    No Known Allergies   Review of Systems: All systems reviewed and negative except where noted in HPI.   Creatinine clearance cannot be calculated (Patient's most recent lab result is older than the maximum 21 days allowed.)   Physical Exam:    Wt Readings from Last 3 Encounters:  03/17/18 224 lb (101.6 kg)  03/05/18 222 lb (100.7 kg)  02/23/18 226 lb 6 oz (102.7 kg)    Pulse 80   Ht 5\' 1"  (1.549 m)   Wt 224 lb (101.6 kg)   SpO2 97%   BMI 42.32 kg/m  Constitutional:  Pleasant female in no acute distress. Psychiatric: Normal mood and affect. Behavior is normal. EENT: Pupils normal.  Conjunctivae are normal. No scleral icterus. Neck supple.  Cardiovascular: Normal rate, regular rhythm. No edema Pulmonary/chest: Effort normal and breath sounds normal. No wheezing, rales or rhonchi. Abdominal: Soft, nondistended, nontender. Bowel sounds active throughout. There are no masses palpable. No hepatomegaly. Neurological: Alert and oriented  to person place and time. Skin: Skin is warm and dry. No rashes noted.  Tye Savoy, NP  03/17/2018, 3:22 PM  Cc: London Pepper, MD

## 2018-03-22 ENCOUNTER — Encounter: Payer: Self-pay | Admitting: Nurse Practitioner

## 2018-03-25 ENCOUNTER — Ambulatory Visit (INDEPENDENT_AMBULATORY_CARE_PROVIDER_SITE_OTHER): Payer: Medicare Other | Admitting: Family Medicine

## 2018-03-25 VITALS — BP 112/70 | HR 72 | Temp 97.7°F | Ht 61.0 in | Wt 221.0 lb

## 2018-03-25 DIAGNOSIS — Z9189 Other specified personal risk factors, not elsewhere classified: Secondary | ICD-10-CM

## 2018-03-25 DIAGNOSIS — E559 Vitamin D deficiency, unspecified: Secondary | ICD-10-CM | POA: Diagnosis not present

## 2018-03-25 DIAGNOSIS — Z6841 Body Mass Index (BMI) 40.0 and over, adult: Secondary | ICD-10-CM

## 2018-03-25 DIAGNOSIS — I10 Essential (primary) hypertension: Secondary | ICD-10-CM

## 2018-03-31 NOTE — Progress Notes (Signed)
Office: [phone]  /  Fax: [phone]   HPI:   Chief Complaint: OBESITY Michelle [name] is here to discuss her progress with her obesity treatment plan. She is on the Category 2 plan and is following her eating plan approximately 60 % of the time. She states she is exercising 0 minutes 0 times per week. Michelle [name] has been trying to follow the plan more strictly. She hasn't been doing snacks and not getting all protein in. She does have many holiday gatherings in the next two weeks.  Her weight is 221 lb (100.2 kg) today and has had a weight loss of 1 pound over a period of 3 weeks since her last visit. She has lost 4 lbs since starting treatment with [place].  Hypertension Michelle [name] is a 72 y.o. female with hypertension. Michelle [name]'s blood pressure is controlled today. She denies chest pain, chest pressure, or headaches. She is working weight loss to help control her blood pressure with the goal of decreasing her risk of heart attack and stroke.   Vitamin D Deficiency Michelle [name] has a diagnosis of vitamin D deficiency. She is currently taking prescription Vit D. She notes fatigue and denies nausea, vomiting or muscle weakness.  At risk for osteopenia and osteoporosis Michelle [name] is at higher risk of osteopenia and osteoporosis due to vitamin D deficiency.   ALLERGIES: No Known Allergies  MEDICATIONS: Current Outpatient Medications on File Prior to Visit  Medication Sig Dispense Refill  . losartan (COZAAR) 100 MG tablet Take 100 mg by mouth daily.   2  . sertraline (ZOLOFT) 100 MG tablet Take 200 mg by mouth daily.   0  . Vitamin D, Ergocalciferol, (DRISDOL) 1.25 MG (50000 UT) CAPS capsule Take 1 capsule (50,000 Units total) by mouth every 7 (seven) days. 4 capsule 0   No current facility-administered medications on file prior to visit.     PAST MEDICAL HISTORY: Past Medical History:  Diagnosis Date  . Anxiety   . Arthritis    "back" (04/24/2017)  . Blurry vision, left eye    "YAG on the  left; to be repaired" (04/24/2017)  . Chondromalacia of knee    right knee  . Chronic bronchitis ([place])   . Chronic lower back pain   . DDD (degenerative disc disease) 07-24-11   Osteoarthritis-s/p LTKA 2'11, now right planned  . Depression 07-24-11   tx. depression only  . Fatty liver   . History of diverticulitis 02/2018  . Hx of adenomatous polyp of colon 03/21/2014  . Hyperlipidemia   . Hypertension   . IBS (irritable bowel syndrome)   . OSA on CPAP 12/07/2016  . Pneumonia 1990s X 1  . SOB (shortness of breath)   . Vitamin D deficiency     PAST SURGICAL HISTORY: Past Surgical History:  Procedure Laterality Date  . APPENDECTOMY  1967  . BOTOX INJECTION     "face"  . CATARACT EXTRACTION W/ INTRAOCULAR LENS IMPLANT Bilateral 2000s  . [place]; 1975  . COLONOSCOPY W/ BIOPSIES AND POLYPECTOMY  "a few"  . DILATION AND CURETTAGE OF UTERUS    . HYSTEROSCOPY W/D&C  08/29/2006   and resection of endometrial polyp/notes 09/10/2010  . JOINT REPLACEMENT    . KNEE ARTHROSCOPY Bilateral   . NASAL SEPTUM SURGERY  1972  . OOPHORECTOMY Right 1994  . TOTAL KNEE ARTHROPLASTY  08/05/2011   Procedure: TOTAL KNEE ARTHROPLASTY;  Surgeon: Gearlean Alf, MD;  Location: [facility];  Service: Orthopedics;  Laterality: Right;  .  TOTAL KNEE ARTHROPLASTY Left 05/2009  . TUBAL LIGATION  1975    SOCIAL HISTORY: Social History   Tobacco Use  . Smoking status: Former Smoker    Packs/day: 0.12    Years: 36.00    Pack years: 4.32    Types: Cigarettes    Last attempt to quit: 06/19/2014    Years since quitting: 3.7  . Smokeless tobacco: Never Used  Substance Use Topics  . Alcohol use: Yes    Alcohol/week: 21.0 standard drinks    Types: 21 Glasses of wine per week    Comment: 04/24/2017 "3, glasses of white wine qd"  . Drug use: No    FAMILY HISTORY: Family History  Problem Relation Age of Onset  . Heart disease Mother        Rheumatic heart disease  . Pulmonary embolism Mother   .  Heart disease Father        valvular heart disease  . Stroke Father   . Hypertension Sister   . Colon cancer Neg Hx   . Esophageal cancer Neg Hx   . Rectal cancer Neg Hx   . Stomach cancer Neg Hx   . Pancreatic cancer Neg Hx     ROS: Review of Systems  Constitutional: Positive for malaise/fatigue and weight loss.  Cardiovascular: Negative for chest pain.       Negative chest pressure  Gastrointestinal: Negative for nausea and vomiting.  Musculoskeletal:       Negative muscle weakness  Neurological: Negative for headaches.    PHYSICAL EXAM: Blood pressure 112/70, pulse 72, temperature 97.7 F (36.5 C), temperature source Oral, height 5\' 1"  (1.549 m), weight 221 lb (100.2 kg), SpO2 93 %. Body mass index is 41.76 kg/m. Physical Exam  Constitutional: She is oriented to person, place, and time. She appears well-developed and well-nourished.  Cardiovascular: Normal rate.  Pulmonary/Chest: Effort normal.  Musculoskeletal: Normal range of motion.  Neurological: She is oriented to person, place, and time.  Skin: Skin is warm and dry.  Psychiatric: She has a normal mood and affect. Her behavior is normal.  Vitals reviewed.   RECENT LABS AND TESTS: BMET    Component Value Date/Time   NA 139 02/23/2018 1424   NA 139 02/18/2018 1217   K 4.3 02/23/2018 1424   CL 105 02/23/2018 1424   CO2 23 02/23/2018 1424   GLUCOSE 104 (H) 02/23/2018 1424   BUN 39 (H) 02/23/2018 1424   BUN 30 (H) 02/18/2018 1217   CREATININE 1.31 (H) 02/23/2018 1424   CALCIUM 9.1 02/23/2018 1424   GFRNONAA 40 (L) 02/23/2018 1424   GFRAA 47 (L) 02/23/2018 1424   Lab Results  Component Value Date   HGBA1C 5.4 02/18/2018   Lab Results  Component Value Date   INSULIN 10.5 02/18/2018   CBC    Component Value Date/Time   WBC 6.9 02/23/2018 1424   RBC 4.02 02/23/2018 1424   HGB 13.5 02/23/2018 1424   HGB 14.1 02/18/2018 1217   HCT 40.4 02/23/2018 1424   HCT 41.1 02/18/2018 1217   PLT 199 02/23/2018  1424   MCV 100.5 (H) 02/23/2018 1424   MCV 99 (H) 02/18/2018 1217   MCH 33.6 02/23/2018 1424   MCHC 33.4 02/23/2018 1424   RDW 11.7 02/23/2018 1424   RDW 12.1 (L) 02/18/2018 1217   LYMPHSABS 1.7 02/18/2018 1217   MONOABS 0.6 04/30/2017 0341   EOSABS 0.2 02/18/2018 1217   BASOSABS 0.1 02/18/2018 1217   Iron/TIBC/Ferritin/ %Sat No results found  for: IRON, TIBC, FERRITIN, IRONPCTSAT Lipid Panel     Component Value Date/Time   CHOL 254 (H) 02/18/2018 1217   TRIG 153 (H) 02/18/2018 1217   HDL 101 02/18/2018 1217   LDLCALC 122 (H) 02/18/2018 1217   Hepatic Function Panel     Component Value Date/Time   PROT 7.7 02/23/2018 1424   PROT 7.0 02/18/2018 1217   ALBUMIN 4.4 02/23/2018 1424   ALBUMIN 4.4 02/18/2018 1217   AST 36 02/23/2018 1424   ALT 54 (H) 02/23/2018 1424   ALKPHOS 70 02/23/2018 1424   BILITOT 0.9 02/23/2018 1424   BILITOT 0.5 02/18/2018 1217      Component Value Date/Time   TSH 1.580 02/18/2018 1217   TSH 1.210 07/31/2016 1226  Results for ELLOWYN, RIEVES (MRN [id number]) as of 03/31/2018 08:46  Ref. Range 02/18/2018 12:17  Vitamin D, 25-Hydroxy Latest Ref Range: 30.0 - 100.0 ng/mL 26.2 (L)    ASSESSMENT AND PLAN: Essential hypertension  Vitamin D deficiency  At risk for osteoporosis  Class 3 severe obesity with serious comorbidity and body mass index (BMI) of 40.0 to 44.9 in adult, unspecified obesity type ([place])  PLAN:  Hypertension We discussed sodium restriction, working on healthy weight loss, and a regular exercise program as the means to achieve improved blood pressure control. Michelle [name] agreed with this plan and agreed to follow up as directed. We will continue to monitor her blood pressure as well as her progress with the above lifestyle modifications. Michelle [name] agrees to continue taking Losartan and she will watch for signs of hypotension as she continues her lifestyle modifications. Michelle [name] agrees to follow up with our clinic in 2 weeks.  Vitamin  D Deficiency Michelle [name] was informed that low vitamin D levels contributes to fatigue and are associated with obesity, breast, and colon cancer. Michelle [name] agrees to continue taking prescription Vit D @50 ,000 IU every week #4 and we will refill for 1 month. She will follow up for routine testing of vitamin D, at least 2-3 times per year. She was informed of the risk of over-replacement of vitamin D and agrees to not increase her dose unless she discusses this with [place] first. Michelle [name] agrees to follow up with our clinic in 2 weeks.  At risk for osteopenia and osteoporosis Michelle [name] was given extended (15 minutes) osteoporosis prevention counseling today. Zakariah is at risk for osteopenia and osteoporsis due to her vitamin D deficiency. She was encouraged to take her vitamin D and follow her higher calcium diet and increase strengthening exercise to help strengthen her bones and decrease her risk of osteopenia and osteoporosis.  Obesity Michelle [name] is currently in the action stage of change. As such, her goal is to continue with weight loss efforts She has agreed to follow the Category 2 plan Michelle [name] has been instructed to work up to a goal of 150 minutes of combined cardio and strengthening exercise per week for weight loss and overall health benefits. We discussed the following Behavioral Modification Strategies today: increasing lean protein intake, increasing vegetables, work on meal planning and easy cooking plans, dealing with family or coworker sabotage and holiday eating strategies    Michelle [name] has agreed to follow up with our clinic in 2 weeks. She was informed of the importance of frequent follow up visits to maximize her success with intensive lifestyle modifications for her multiple health conditions.   OBESITY BEHAVIORAL INTERVENTION VISIT  Today's visit was # 3   Starting weight: 225 lbs Starting date: 02/18/18 Today's weight : 221  lbs Today's date: 03/25/2018 Total lbs lost to date:  4    ASK: We discussed the diagnosis of obesity with Michelle [name] today and Michelle [name] agreed to give [place] permission to discuss obesity behavioral modification therapy today.  ASSESS: Michelle [name] has the diagnosis of obesity and her BMI today is 41.78 Michelle [name] is in the action stage of change   ADVISE: Michelle [name] was educated on the multiple health risks of obesity as well as the benefit of weight loss to improve her health. She was advised of the need for long term treatment and the importance of lifestyle modifications to improve her current health and to decrease her risk of future health problems.  AGREE: Multiple dietary modification options and treatment options were discussed and  Anabelen agreed to follow the recommendations documented in the above note.  ARRANGE: Jocilynn was educated on the importance of frequent visits to treat obesity as outlined per CMS and USPSTF guidelines and agreed to schedule her next follow up appointment today.  I, Trixie Dredge, am acting as transcriptionist for Ilene Qua, MD  I have reviewed the above documentation for accuracy and completeness, and I agree with the above. - Ilene Qua, MD

## 2018-04-07 ENCOUNTER — Other Ambulatory Visit (INDEPENDENT_AMBULATORY_CARE_PROVIDER_SITE_OTHER): Payer: Self-pay | Admitting: Family Medicine

## 2018-04-07 DIAGNOSIS — R05 Cough: Secondary | ICD-10-CM | POA: Diagnosis not present

## 2018-04-07 DIAGNOSIS — E559 Vitamin D deficiency, unspecified: Secondary | ICD-10-CM

## 2018-04-09 ENCOUNTER — Other Ambulatory Visit (INDEPENDENT_AMBULATORY_CARE_PROVIDER_SITE_OTHER): Payer: Self-pay

## 2018-04-09 ENCOUNTER — Telehealth (INDEPENDENT_AMBULATORY_CARE_PROVIDER_SITE_OTHER): Payer: Self-pay | Admitting: Family Medicine

## 2018-04-09 ENCOUNTER — Ambulatory Visit (INDEPENDENT_AMBULATORY_CARE_PROVIDER_SITE_OTHER): Payer: Medicare Other | Admitting: Family Medicine

## 2018-04-09 DIAGNOSIS — E559 Vitamin D deficiency, unspecified: Secondary | ICD-10-CM

## 2018-04-09 MED ORDER — VITAMIN D (ERGOCALCIFEROL) 1.25 MG (50000 UNIT) PO CAPS: 50000.0000 [IU] | ORAL_CAPSULE | ORAL | 0 refills | Status: DC

## 2018-04-09 NOTE — Telephone Encounter (Signed)
Error

## 2018-04-09 NOTE — Telephone Encounter (Signed)
Prescription sent in to the pharmacy. Anitra Doxtater, CMA

## 2018-04-09 NOTE — Telephone Encounter (Signed)
Patient stated at her last appt on 12/4 a refill of her Vit D was to be sent to [facility].  Pharmacy states they did not receive.  Please advise. Thank you

## 2018-04-12 DIAGNOSIS — J069 Acute upper respiratory infection, unspecified: Secondary | ICD-10-CM | POA: Diagnosis not present

## 2018-04-27 ENCOUNTER — Ambulatory Visit (INDEPENDENT_AMBULATORY_CARE_PROVIDER_SITE_OTHER): Payer: Medicare Other | Admitting: Family Medicine

## 2018-04-27 ENCOUNTER — Encounter (INDEPENDENT_AMBULATORY_CARE_PROVIDER_SITE_OTHER): Payer: Self-pay | Admitting: Family Medicine

## 2018-04-27 VITALS — BP 105/68 | HR 91 | Temp 97.8°F | Ht 61.0 in | Wt 220.0 lb

## 2018-04-27 DIAGNOSIS — Z6841 Body Mass Index (BMI) 40.0 and over, adult: Secondary | ICD-10-CM | POA: Diagnosis not present

## 2018-04-27 DIAGNOSIS — E559 Vitamin D deficiency, unspecified: Secondary | ICD-10-CM | POA: Diagnosis not present

## 2018-04-27 DIAGNOSIS — I10 Essential (primary) hypertension: Secondary | ICD-10-CM

## 2018-04-28 IMAGING — DX DG CHEST 2V
2 series · 2 of 2 positions shown · non-contrast
Comparison: 05/02/2014

CLINICAL DATA: Shortness of breath.  Cough and congestion.

EXAM:
CHEST  2 VIEW

[chest pa]
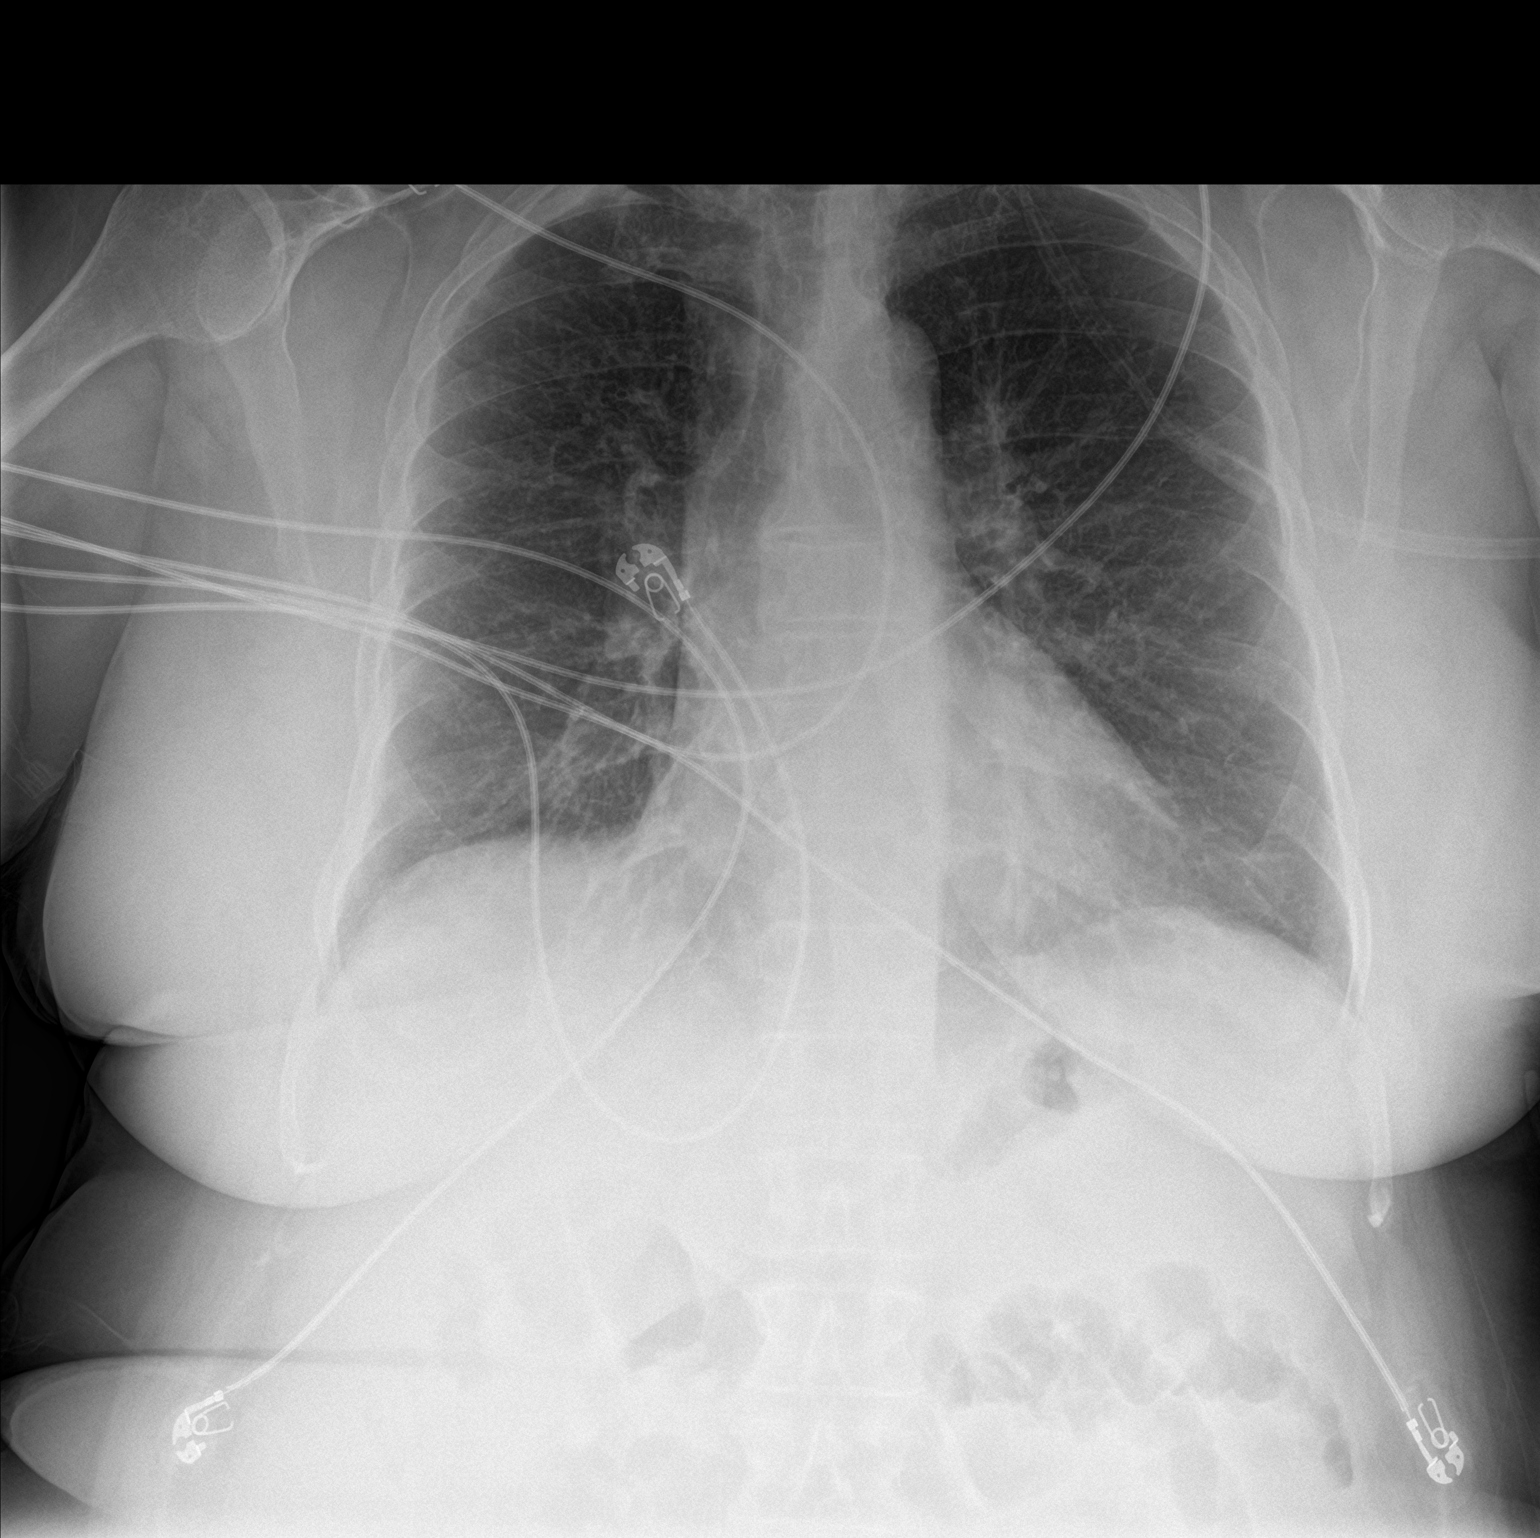

[chest lat]
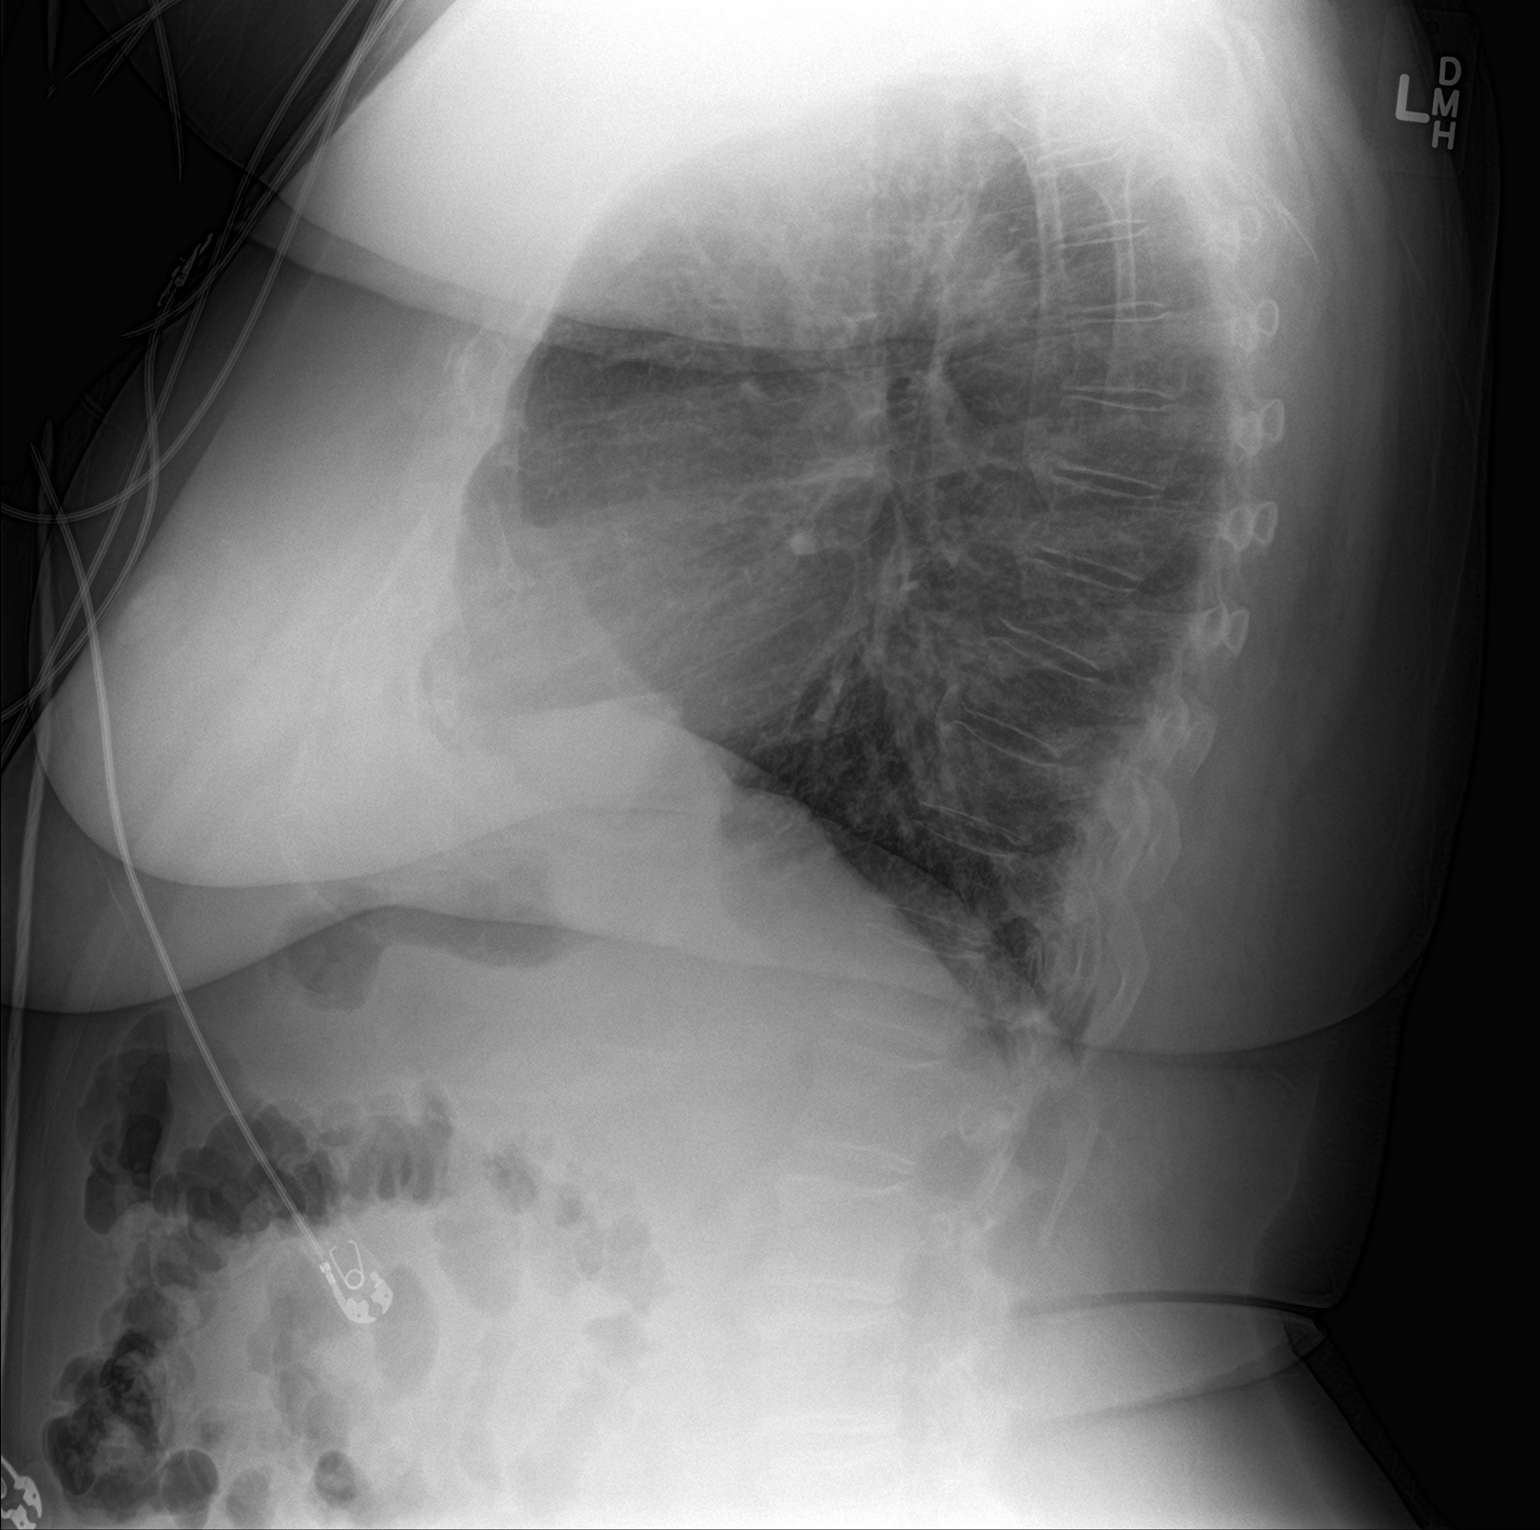

[2 of 2 positions shown; findings below may reference images not displayed]

FINDINGS: Mediastinum hilar structures normal. Volumes with mild basilar
atelectasis. No pleural effusion or pneumothorax. Heart size normal.
No acute bony abnormality.
IMPRESSION: Low lung volumes mild basilar atelectasis. No acute pulmonary
infiltrate.

## 2018-04-28 NOTE — Progress Notes (Signed)
Office: [phone]  /  Fax: [phone]   HPI:   Chief Complaint: OBESITY Michelle [name] is here to discuss her progress with her obesity treatment plan. She is on the Category 2 plan and is following her eating plan approximately 50 % of the time. She states she is exercising 0 minutes 0 times per week. Michelle [name] has been ill at the beginning of December, so she had to reschedule. She had a quiet holiday season. She practiced mindfulness in terms of choices and portion control.  Her weight is 220 lb (99.8 kg) today and has had a weight loss of 1 pound over a period of 4 to 5 weeks since her last visit. She has lost 5 lbs since starting treatment with [place].  Hypertension Michelle [name] is a 73 y.o. female with hypertension. Michelle [name]'s blood pressure is controlled. She denies chest pain, chest pressure, or headaches. She is working weight loss to help control her blood pressure with the goal of decreasing her risk of heart attack and stroke.   Vitamin D Deficiency Michelle [name] has a diagnosis of vitamin D deficiency. She is currently taking prescription Vit D. She notes fatigue and denies nausea, vomiting or muscle weakness.  ASSESSMENT AND PLAN:  Essential hypertension  Vitamin D deficiency  Class 3 severe obesity with serious comorbidity and body mass index (BMI) of 40.0 to 44.9 in adult, unspecified obesity type ([place])  PLAN:  Hypertension We discussed sodium restriction, working on healthy weight loss, and a regular exercise program as the means to achieve improved blood pressure control. Michelle [name] agreed with this plan and agreed to follow up as directed. We will continue to monitor her blood pressure as well as her progress with the above lifestyle modifications. Michelle [name] agrees to continue taking losartan and will watch for signs of hypotension as she continues her lifestyle modifications. Michelle [name] agrees to follow up with our clinic in 2 weeks.  Vitamin D Deficiency Michelle [name] was informed that  low vitamin D levels contributes to fatigue and are associated with obesity, breast, and colon cancer. Michelle [name] agrees to continue taking prescription Vit D @50 ,000 IU every week and will follow up for routine testing of vitamin D, at least 2-3 times per year. She was informed of the risk of over-replacement of vitamin D and agrees to not increase her dose unless she discusses this with [place] first. Michelle [name] agrees to follow up with our clinic in 2 weeks.  I spent > than 50% of the 15 minute visit on counseling as documented in the note.  Obesity Michelle [name] is currently in the action stage of change. As such, her goal is to continue with weight loss efforts She has agreed to follow the Category 2 plan Michelle [name] has been instructed to work up to a goal of 150 minutes of combined cardio and strengthening exercise per week for weight loss and overall health benefits. We discussed the following Behavioral Modification Strategies today: increasing lean protein intake, increasing vegetables, work on meal planning and easy cooking plans, and planning for success   Michelle [name] has agreed to follow up with our clinic in 2 weeks. She was informed of the importance of frequent follow up visits to maximize her success with intensive lifestyle modifications for her multiple health conditions.  ALLERGIES: No Known Allergies  MEDICATIONS: Current Outpatient Medications on File Prior to Visit  Medication Sig Dispense Refill  . losartan (COZAAR) 100 MG tablet Take 100 mg by mouth daily.   2  . sertraline (ZOLOFT) 100 MG tablet  Take 200 mg by mouth daily.   0  . Vitamin D, Ergocalciferol, (DRISDOL) 1.25 MG (50000 UT) CAPS capsule Take 1 capsule (50,000 Units total) by mouth every 7 (seven) days. 4 capsule 0   No current facility-administered medications on file prior to visit.     PAST MEDICAL HISTORY: Past Medical History:  Diagnosis Date  . Anxiety   . Arthritis    "back" (04/24/2017)  . 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 Chondromalacia of knee    right knee  . Chronic bronchitis ([place])   . Chronic lower back pain   . DDD (degenerative disc disease) 07-24-11   Osteoarthritis-s/p LTKA 2'11, now right planned  . Depression 07-24-11   tx. depression only  . Fatty liver   . History of diverticulitis 02/2018  . Hx of adenomatous polyp of colon 03/21/2014  . Hyperlipidemia   . Hypertension   . IBS (irritable bowel syndrome)   . OSA on CPAP 12/07/2016  . Pneumonia 1990s X 1  . SOB (shortness of breath)   . Vitamin D deficiency     PAST SURGICAL HISTORY: Past Surgical History:  Procedure Laterality Date  . APPENDECTOMY  1967  . BOTOX INJECTION     "face"  . CATARACT EXTRACTION W/ INTRAOCULAR LENS IMPLANT Bilateral 2000s  . [place]; 1975  . COLONOSCOPY W/ BIOPSIES AND POLYPECTOMY  "a few"  . DILATION AND CURETTAGE OF UTERUS    . HYSTEROSCOPY W/D&C  08/29/2006   and resection of endometrial polyp/notes 09/10/2010  . JOINT REPLACEMENT    . KNEE ARTHROSCOPY Bilateral   . NASAL SEPTUM SURGERY  1972  . OOPHORECTOMY Right 1994  . TOTAL KNEE ARTHROPLASTY  08/05/2011   Procedure: TOTAL KNEE ARTHROPLASTY;  Surgeon: Gearlean Alf, MD;  Location: [facility];  Service: Orthopedics;  Laterality: Right;  . TOTAL KNEE ARTHROPLASTY Left 05/2009  . TUBAL LIGATION  1975    SOCIAL HISTORY: Social History   Tobacco Use  . Smoking status: Former Smoker    Packs/day: 0.12    Years: 36.00    Pack years: 4.32    Types: Cigarettes    Last attempt to quit: 06/19/2014    Years since quitting: 3.8  . Smokeless tobacco: Never Used  Substance Use Topics  . Alcohol use: Yes    Alcohol/week: 21.0 standard drinks    Types: 21 Glasses of wine per week    Comment: 04/24/2017 "3, glasses of white wine qd"  . Drug use: No    FAMILY HISTORY: Family History  Problem Relation Age of Onset  . Heart disease Mother        Rheumatic heart disease  . Pulmonary embolism  Mother   . Heart disease Father        valvular heart disease  . Stroke Father   . Hypertension Sister   . Colon cancer Neg Hx   . Esophageal cancer Neg Hx   . Rectal cancer Neg Hx   . Stomach cancer Neg Hx   . Pancreatic cancer Neg Hx     ROS: Review of Systems  Constitutional: Positive for malaise/fatigue and weight loss.  Cardiovascular: Negative for chest pain.       Negative chest pressure  Gastrointestinal: Negative for nausea and vomiting.  Musculoskeletal:       Negative muscle weakness  Neurological: Negative for headaches.    PHYSICAL EXAM: Blood pressure 105/68, pulse 91, temperature  97.8 F (36.6 C), temperature source Oral, height 5\' 1"  (1.549 m), weight 220 lb (99.8 kg), SpO2 94 %. Body mass index is 41.57 kg/m. Physical Exam Vitals signs reviewed.  Constitutional:      Appearance: Normal appearance. She is obese.  Cardiovascular:     Rate and Rhythm: Normal rate.     Pulses: Normal pulses.  Pulmonary:     Effort: Pulmonary effort is normal.  Musculoskeletal: Normal range of motion.  Skin:    General: Skin is warm and dry.  Neurological:     Mental Status: She is alert and oriented to person, place, and time.  Psychiatric:        Mood and Affect: Mood normal.        Behavior: Behavior normal.     RECENT LABS AND TESTS: BMET    Component Value Date/Time   NA 139 02/23/2018 1424   NA 139 02/18/2018 1217   K 4.3 02/23/2018 1424   CL 105 02/23/2018 1424   CO2 23 02/23/2018 1424   GLUCOSE 104 (H) 02/23/2018 1424   BUN 39 (H) 02/23/2018 1424   BUN 30 (H) 02/18/2018 1217   CREATININE 1.31 (H) 02/23/2018 1424   CALCIUM 9.1 02/23/2018 1424   GFRNONAA 40 (L) 02/23/2018 1424   GFRAA 47 (L) 02/23/2018 1424   Lab Results  Component Value Date   HGBA1C 5.4 02/18/2018   Lab Results  Component Value Date   INSULIN 10.5 02/18/2018   CBC    Component Value Date/Time   WBC 6.9 02/23/2018 1424   RBC 4.02 02/23/2018 1424   HGB 13.5 02/23/2018  1424   HGB 14.1 02/18/2018 1217   HCT 40.4 02/23/2018 1424   HCT 41.1 02/18/2018 1217   PLT 199 02/23/2018 1424   MCV 100.5 (H) 02/23/2018 1424   MCV 99 (H) 02/18/2018 1217   MCH 33.6 02/23/2018 1424   MCHC 33.4 02/23/2018 1424   RDW 11.7 02/23/2018 1424   RDW 12.1 (L) 02/18/2018 1217   LYMPHSABS 1.7 02/18/2018 1217   MONOABS 0.6 04/30/2017 0341   EOSABS 0.2 02/18/2018 1217   BASOSABS 0.1 02/18/2018 1217   Iron/TIBC/Ferritin/ %Sat No results found for: IRON, TIBC, FERRITIN, IRONPCTSAT Lipid Panel     Component Value Date/Time   CHOL 254 (H) 02/18/2018 1217   TRIG 153 (H) 02/18/2018 1217   HDL 101 02/18/2018 1217   LDLCALC 122 (H) 02/18/2018 1217   Hepatic Function Panel     Component Value Date/Time   PROT 7.7 02/23/2018 1424   PROT 7.0 02/18/2018 1217   ALBUMIN 4.4 02/23/2018 1424   ALBUMIN 4.4 02/18/2018 1217   AST 36 02/23/2018 1424   ALT 54 (H) 02/23/2018 1424   ALKPHOS 70 02/23/2018 1424   BILITOT 0.9 02/23/2018 1424   BILITOT 0.5 02/18/2018 1217      Component Value Date/Time   TSH 1.580 02/18/2018 1217   TSH 1.210 07/31/2016 1226      OBESITY BEHAVIORAL INTERVENTION VISIT  Today's visit was # 4   Starting weight: 225 lbs Starting date: 02/18/18 Today's weight : 220 lbs Today's date: 04/27/2018 Total lbs lost to date: 5    ASK: We discussed the diagnosis of obesity with Michelle [name] today and Michelle [name] agreed to give [place] permission to discuss obesity behavioral modification therapy today.  ASSESS: Taylah has the diagnosis of obesity and her BMI today is 41.59 Glenice is in the action stage of change   ADVISE: Michelle [name] was educated on the multiple health  risks of obesity as well as the benefit of weight loss to improve her health. She was advised of the need for long term treatment and the importance of lifestyle modifications to improve her current health and to decrease her risk of future health problems.  AGREE: Multiple dietary  modification options and treatment options were discussed and  Michelle [name] agreed to follow the recommendations documented in the above note.  ARRANGE: Michelle [name] was educated on the importance of frequent visits to treat obesity as outlined per CMS and USPSTF guidelines and agreed to schedule her next follow up appointment today.  I, Trixie Dredge, am acting as transcriptionist for Ilene Qua, MD  I have reviewed the above documentation for accuracy and completeness, and I agree with the above. - Ilene Qua, MD

## 2018-04-30 DIAGNOSIS — H5213 Myopia, bilateral: Secondary | ICD-10-CM | POA: Diagnosis not present

## 2018-05-13 ENCOUNTER — Encounter (INDEPENDENT_AMBULATORY_CARE_PROVIDER_SITE_OTHER): Payer: Self-pay

## 2018-05-13 ENCOUNTER — Ambulatory Visit (INDEPENDENT_AMBULATORY_CARE_PROVIDER_SITE_OTHER): Payer: Medicare Other | Admitting: Family Medicine

## 2018-07-02 DIAGNOSIS — D485 Neoplasm of uncertain behavior of skin: Secondary | ICD-10-CM | POA: Diagnosis not present

## 2018-07-02 DIAGNOSIS — Z85828 Personal history of other malignant neoplasm of skin: Secondary | ICD-10-CM | POA: Diagnosis not present

## 2018-07-02 DIAGNOSIS — L905 Scar conditions and fibrosis of skin: Secondary | ICD-10-CM | POA: Diagnosis not present

## 2018-07-03 DIAGNOSIS — L820 Inflamed seborrheic keratosis: Secondary | ICD-10-CM | POA: Diagnosis not present

## 2018-08-09 ENCOUNTER — Emergency Department (HOSPITAL_COMMUNITY)
Admission: EM | Admit: 2018-08-09 | Discharge: 2018-08-09 | Disposition: A | Discharge status: Home / Self Care | Payer: Medicare Other | Attending: Emergency Medicine | Admitting: Emergency Medicine

## 2018-08-09 ENCOUNTER — Encounter (HOSPITAL_COMMUNITY): Payer: Self-pay | Admitting: Emergency Medicine

## 2018-08-09 ENCOUNTER — Emergency Department (HOSPITAL_COMMUNITY): Payer: Medicare Other

## 2018-08-09 ENCOUNTER — Other Ambulatory Visit: Payer: Self-pay

## 2018-08-09 DIAGNOSIS — Z20828 Contact with and (suspected) exposure to other viral communicable diseases: Secondary | ICD-10-CM | POA: Insufficient documentation

## 2018-08-09 DIAGNOSIS — R0789 Other chest pain: Secondary | ICD-10-CM | POA: Diagnosis not present

## 2018-08-09 DIAGNOSIS — I10 Essential (primary) hypertension: Secondary | ICD-10-CM | POA: Diagnosis not present

## 2018-08-09 DIAGNOSIS — R079 Chest pain, unspecified: Secondary | ICD-10-CM | POA: Diagnosis not present

## 2018-08-09 DIAGNOSIS — Z79899 Other long term (current) drug therapy: Secondary | ICD-10-CM | POA: Diagnosis not present

## 2018-08-09 DIAGNOSIS — Z87891 Personal history of nicotine dependence: Secondary | ICD-10-CM | POA: Insufficient documentation

## 2018-08-09 DIAGNOSIS — R0602 Shortness of breath: Secondary | ICD-10-CM | POA: Diagnosis not present

## 2018-08-09 DIAGNOSIS — Z96653 Presence of artificial knee joint, bilateral: Secondary | ICD-10-CM | POA: Diagnosis not present

## 2018-08-09 LAB — CBC WITH DIFFERENTIAL/PLATELET
Abs Immature Granulocytes: 0.02 10*3/uL (ref 0.00–0.07)
Basophils Absolute: 0.1 10*3/uL (ref 0.0–0.1)
Basophils Relative: 1 %
Eosinophils Absolute: 0.2 10*3/uL (ref 0.0–0.5)
Eosinophils Relative: 3 %
HCT: 41.8 % (ref 36.0–46.0)
Hemoglobin: 13.8 g/dL (ref 12.0–15.0)
Immature Granulocytes: 0 %
Lymphocytes Relative: 32 %
Lymphs Abs: 2.4 10*3/uL (ref 0.7–4.0)
MCH: 33.1 pg (ref 26.0–34.0)
MCHC: 33 g/dL (ref 30.0–36.0)
MCV: 100.2 fL — ABNORMAL HIGH (ref 80.0–100.0)
Monocytes Absolute: 1 10*3/uL (ref 0.1–1.0)
Monocytes Relative: 14 %
Neutro Abs: 3.7 10*3/uL (ref 1.7–7.7)
Neutrophils Relative %: 50 %
Platelets: 184 10*3/uL (ref 150–400)
RBC: 4.17 MIL/uL (ref 3.87–5.11)
RDW: 11.9 % (ref 11.5–15.5)
WBC: 7.3 10*3/uL (ref 4.0–10.5)
nRBC: 0 % (ref 0.0–0.2)

## 2018-08-09 LAB — COMPREHENSIVE METABOLIC PANEL
ALT: 61 U/L — ABNORMAL HIGH (ref 0–44)
AST: 36 U/L (ref 15–41)
Albumin: 3.9 g/dL (ref 3.5–5.0)
Alkaline Phosphatase: 63 U/L (ref 38–126)
Anion gap: 9 (ref 5–15)
BUN: 24 mg/dL — ABNORMAL HIGH (ref 8–23)
CO2: 20 mmol/L — ABNORMAL LOW (ref 22–32)
Calcium: 8.8 mg/dL — ABNORMAL LOW (ref 8.9–10.3)
Chloride: 104 mmol/L (ref 98–111)
Creatinine, Ser: 1 mg/dL (ref 0.44–1.00)
GFR calc Af Amer: >60 mL/min (ref >60)
GFR calc non Af Amer: 57 mL/min — ABNORMAL LOW (ref >60)
Glucose, Bld: 100 mg/dL — ABNORMAL HIGH (ref 70–99)
Potassium: 3.8 mmol/L (ref 3.5–5.1)
Sodium: 133 mmol/L — ABNORMAL LOW (ref 135–145)
Total Bilirubin: 0.9 mg/dL (ref 0.3–1.2)
Total Protein: 7 g/dL (ref 6.5–8.1)

## 2018-08-09 LAB — D-DIMER, QUANTITATIVE: D-Dimer, Quant: <0.27 ug/mL-FEU (ref 0.00–0.50)

## 2018-08-09 LAB — SARS CORONAVIRUS 2 BY RT PCR (HOSPITAL ORDER, PERFORMED IN ~~LOC~~ HOSPITAL LAB): SARS Coronavirus 2: NEGATIVE

## 2018-08-09 LAB — TROPONIN I: Troponin I: <0.03 ng/mL (ref <0.03)

## 2018-08-09 LAB — BRAIN NATRIURETIC PEPTIDE: B Natriuretic Peptide: 28.7 pg/mL (ref 0.0–100.0)

## 2018-08-09 NOTE — ED Provider Notes (Signed)
[facility] EMERGENCY DEPARTMENT Provider Note   CSN: [id number] Arrival date & time: 08/09/18  1745    History   Chief Complaint Chief Complaint  Patient presents with  . Shortness of Breath  . Chest Pain    HPI Michelle [name] is a 73 y.o. female.     HPI  The patient is a 73 year old female, she has a history of bronchitis as well as a history of chronic lower back pain, degenerative disc disease, diverticulitis and hypertension.  She presents to the hospital with 1 day of chest discomfort which is mid to upper chest, worse with palpation, worse with breathing out, not worse with breathing and.  This chest pain is been only today, she does feel short of breath, she has not had fevers but may be occasional coughing.  No swelling in the leg, no nausea vomiting diarrhea, no travel, no other risk factors for pulmonary embolism, no other known contacts with infections including coronavirus.  No medications given prior to arrival.  Denies any prior known history of cardiac disease  Past Medical History:  Diagnosis Date  . Anxiety   . Arthritis    "back" (04/24/2017)  . 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 Chondromalacia of knee    right knee  . Chronic bronchitis ([place])   . Chronic lower back pain   . DDD (degenerative disc disease) 07-24-11   Osteoarthritis-s/p LTKA 2'11, now right planned  . Depression 07-24-11   tx. depression only  . Fatty liver   . History of diverticulitis 02/2018  . Hx of adenomatous polyp of colon 03/21/2014  . Hyperlipidemia   . Hypertension   . IBS (irritable bowel syndrome)   . OSA on CPAP 12/07/2016  . Pneumonia 1990s X 1  . SOB (shortness of breath)   . Vitamin D deficiency     Patient Active Problem List   Diagnosis Date Noted  . Cough 05/14/2017  . ETOH abuse 04/27/2017  . Abnormal LFTs 04/26/2017  . Thrombocytopenia ([place]) 04/26/2017  . Acute hypoxemic respiratory failure ([place])  04/24/2017  . Acute bronchitis 04/24/2017  . Dyspnea on exertion 03/18/2017  . Morbid obesity ([place]) 03/18/2017  . Obstructive sleep apnea 12/07/2016  . Essential hypertension 12/07/2016  . Hx of adenomatous polyp of colon 03/21/2014  . Postop Hyponatremia 08/07/2011  . OA (osteoarthritis) of knee 08/05/2011  . Pain in limb 05/15/2011    Past Surgical History:  Procedure Laterality Date  . APPENDECTOMY  1967  . BOTOX INJECTION     "face"  . CATARACT EXTRACTION W/ INTRAOCULAR LENS IMPLANT Bilateral 2000s  . [place]; 1975  . COLONOSCOPY W/ BIOPSIES AND POLYPECTOMY  "a few"  . DILATION AND CURETTAGE OF UTERUS    . HYSTEROSCOPY W/D&C  08/29/2006   and resection of endometrial polyp/notes 09/10/2010  . JOINT REPLACEMENT    . KNEE ARTHROSCOPY Bilateral   . NASAL SEPTUM SURGERY  1972  . OOPHORECTOMY Right 1994  . TOTAL KNEE ARTHROPLASTY  08/05/2011   Procedure: TOTAL KNEE ARTHROPLASTY;  Surgeon: Gearlean Alf, MD;  Location: [facility];  Service: Orthopedics;  Laterality: Right;  . TOTAL KNEE ARTHROPLASTY Left 05/2009  . TUBAL LIGATION  1975     OB History    Gravida  2   Para  2   Term      Preterm      AB  Living  2     SAB      TAB      Ectopic      Multiple      Live Births               Home Medications    Prior to Admission medications   Medication Sig Start Date End Date Taking? Authorizing Provider  Ascorbic Acid (VITAMIN C) 100 MG tablet Take 100 mg by mouth daily.   Yes [provider]  CALCIUM CITRATE PO Take 1 tablet by mouth daily.   Yes [provider]  cholecalciferol (VITAMIN D3) 25 MCG (1000 UT) tablet Take 1,000 Units by mouth daily.   Yes [provider]  losartan (COZAAR) 25 MG tablet Take 25 mg by mouth daily.  11/27/17  Yes [provider]  sertraline (ZOLOFT) 100 MG tablet Take 100 mg by mouth 2 (two) times a day.  03/07/17  Yes [provider]  vitamin B-12  (CYANOCOBALAMIN) 100 MCG tablet Take 100 mcg by mouth daily.   Yes [provider]    Family History Family History  Problem Relation Age of Onset  . Heart disease Mother        Rheumatic heart disease  . Pulmonary embolism Mother   . Heart disease Father        valvular heart disease  . Stroke Father   . Hypertension Sister   . Colon cancer Neg Hx   . Esophageal cancer Neg Hx   . Rectal cancer Neg Hx   . Stomach cancer Neg Hx   . Pancreatic cancer Neg Hx     Social History Social History   Tobacco Use  . Smoking status: Former Smoker    Packs/day: 0.12    Years: 36.00    Pack years: 4.32    Types: Cigarettes    Last attempt to quit: 06/19/2014    Years since quitting: 4.1  . Smokeless tobacco: Never Used  Substance Use Topics  . Alcohol use: Yes    Alcohol/week: 21.0 standard drinks    Types: 21 Glasses of wine per week    Comment: 04/24/2017 "3, glasses of white wine qd"  . Drug use: No     Allergies   Patient has no known allergies.   Review of Systems Review of Systems  All other systems reviewed and are negative.    Physical Exam Updated Vital Signs BP 123/71   Pulse 85   Temp 98.4 F (36.9 C) (Oral)   Resp (!) 22   SpO2 95%   Physical Exam Vitals signs and nursing note reviewed.  Constitutional:      General: She is not in acute distress.    Appearance: She is well-developed.     Interventions: She is not intubated. HENT:     Head: Normocephalic and atraumatic.     Mouth/Throat:     Pharynx: No oropharyngeal exudate.  Eyes:     General: No scleral icterus.       Right eye: No discharge.        Left eye: No discharge.     Conjunctiva/sclera: Conjunctivae normal.     Pupils: Pupils are equal, round, and reactive to light.  Neck:     Musculoskeletal: Normal range of motion and neck supple.     Thyroid: No thyromegaly.     Vascular: No JVD.  Cardiovascular:     Rate and Rhythm: Normal rate and regular rhythm.     Heart  sounds:  Normal heart sounds. No murmur. No friction rub. No gallop.   Pulmonary:     Effort: Tachypnea present. No accessory muscle usage or respiratory distress. She is not intubated.     Breath sounds: Normal breath sounds. No stridor. No wheezing or rales.  Chest:     Chest wall: Tenderness ( Tenderness across the upper chest and midsternum) present.  Abdominal:     General: Bowel sounds are normal. There is no distension.     Palpations: Abdomen is soft. There is no mass.     Tenderness: There is no abdominal tenderness.  Musculoskeletal: Normal range of motion.        General: No tenderness.  Lymphadenopathy:     Cervical: No cervical adenopathy.  Skin:    General: Skin is warm and dry.     Findings: No erythema or rash.  Neurological:     Mental Status: She is alert.     Coordination: Coordination normal.  Psychiatric:        Behavior: Behavior normal.      ED Treatments / Results  Labs (all labs ordered are listed, but only abnormal results are displayed) Labs Reviewed  CBC WITH DIFFERENTIAL/PLATELET - Abnormal; Notable for the following components:      Result Value   MCV 100.2 (*)    All other components within normal limits  COMPREHENSIVE METABOLIC PANEL - Abnormal; Notable for the following components:   Sodium 133 (*)    CO2 20 (*)    Glucose, Bld 100 (*)    BUN 24 (*)    Calcium 8.8 (*)    ALT 61 (*)    GFR calc non Af Amer 57 (*)    All other components within normal limits  SARS CORONAVIRUS 2 (HOSPITAL ORDER, [facility])  D-DIMER, QUANTITATIVE (NOT AT [facility])  TROPONIN I  BRAIN NATRIURETIC PEPTIDE    EKG EKG Interpretation  Date/Time:  Sunday August 09 2018 17:56:32 EDT Ventricular Rate:  90 PR Interval:    QRS Duration: 86 QT Interval:  358 QTC Calculation: 438 R Axis:   -26 Text Interpretation:  Sinus rhythm Low voltage, precordial leads Abnormal R-wave progression, early transition LVH by voltage since last tracing no  significant change Confirmed by Noemi Chapel [phone]) on 08/09/2018 6:11:47 PM   Radiology Dg Chest Port 1 View  Result Date: 08/09/2018 CLINICAL DATA:  Acute chest pain and shortness of breath today. EXAM: PORTABLE CHEST 1 VIEW COMPARISON:  05/02/2017 and prior radiographs FINDINGS: Mild cardiomegaly again noted. Mild peribronchial thickening is unchanged. There is no evidence of focal airspace disease, pulmonary edema, suspicious pulmonary nodule/mass, pleural effusion, or pneumothorax. No acute bony abnormalities are identified. IMPRESSION: Mild cardiomegaly without evidence of acute cardiopulmonary disease. Electronically Signed   By: Margarette Canada M.D.   On: 08/09/2018 18:45    Procedures Procedures (including critical care time)  Medications Ordered in ED Medications - No data to display   Initial Impression / Assessment and Plan / ED Course  I have reviewed the triage vital signs and the nursing notes.  Pertinent labs & imaging results that were available during my care of the patient were reviewed by me and considered in my medical decision making (see chart for details).  Clinical Course as of Aug 09 2246  Sun Aug 09, 2018  2025 I have personally viewed and interpreted the portable chest x-ray, I do not see any significant signs of pulmonary disease, there is mild cardiomegaly,  no significant mediastinal or subdiaphragmatic or soft tissue injury.   [BM]  2026 D-dimer is normal, troponin is normal, CBC and metabolic panel are unremarkable, coronavirus test pending.   [BM]    Clinical Course User Index [BM] Noemi Chapel, MD       The patient does appear to be slightly tachypneic, she does have reproducible tenderness.  Her increasing shortness of breath and chest discomfort raises suspicion for possible pulmonary embolism, likely musculoskeletal chest pain, unlikely to be cardiac in nature, rule out coronavirus as well.  Patient agreeable  I have personally interpreted the  EKG, there is no findings of acute ischemia or arrhythmia.  There is slight signs of LVH  I have personally looked at the patient's chest x-ray and interpreted all of her results including her normal CBC, metabolic panel, troponin, BNP, d-dimer and her coronavirus test all which were reassuring.  She is still having mild chest pain similar to what she has had the whole day long.  With no acute findings the patient is definitely stable for discharge at this time and is aware of the indications for return.  Final Clinical Impressions(s) / ED Diagnoses   Final diagnoses:  Chest wall pain      Noemi Chapel, MD 08/09/18 2249

## 2018-08-09 NOTE — Discharge Instructions (Signed)
Your testing today does not show any specific abnormalities in fact your heart attack, blood clot and coronavirus test were all normal.  You should return to see your doctor within 2 to 3 days for a recheck if still having symptoms but please come back to the emergency department for severe or worsening pain, difficulty breathing or any other severe or worsening symptoms.

## 2018-08-09 NOTE — ED Triage Notes (Signed)
Pt here for evaluation of worsening shortness of breath over the last several days, worse when lying down. She also reports a sore throat last week. Pt denies fevers, abdominal pain or diarrhea. Pt endorses centralized chest pain that is reproducible on palpation.

## 2018-08-27 ENCOUNTER — Telehealth: Payer: Self-pay | Admitting: Cardiology

## 2018-08-27 DIAGNOSIS — G4733 Obstructive sleep apnea (adult) (pediatric): Secondary | ICD-10-CM

## 2018-08-27 NOTE — Telephone Encounter (Signed)
New Message   Bell with [facility] is calling in reference to an order for CPAP supplies

## 2018-08-28 NOTE — Telephone Encounter (Deleted)
Order placed to [facility] for supplies

## 2018-08-28 NOTE — Telephone Encounter (Signed)
[facility] need Medical Necessity signed and sent back. Dr TT is not in the office until 09/07/18 to sign.

## 2018-09-07 DIAGNOSIS — J441 Chronic obstructive pulmonary disease with (acute) exacerbation: Secondary | ICD-10-CM | POA: Diagnosis not present

## 2018-09-07 DIAGNOSIS — G4733 Obstructive sleep apnea (adult) (pediatric): Secondary | ICD-10-CM | POA: Diagnosis not present

## 2018-11-09 DIAGNOSIS — Z0000 Encounter for general adult medical examination without abnormal findings: Secondary | ICD-10-CM | POA: Diagnosis not present

## 2018-11-09 DIAGNOSIS — E559 Vitamin D deficiency, unspecified: Secondary | ICD-10-CM | POA: Diagnosis not present

## 2018-11-09 DIAGNOSIS — E785 Hyperlipidemia, unspecified: Secondary | ICD-10-CM | POA: Diagnosis not present

## 2018-11-09 DIAGNOSIS — R1010 Upper abdominal pain, unspecified: Secondary | ICD-10-CM | POA: Diagnosis not present

## 2018-11-09 DIAGNOSIS — R7309 Other abnormal glucose: Secondary | ICD-10-CM | POA: Diagnosis not present

## 2018-11-09 DIAGNOSIS — Z8249 Family history of ischemic heart disease and other diseases of the circulatory system: Secondary | ICD-10-CM | POA: Diagnosis not present

## 2018-11-12 DIAGNOSIS — I10 Essential (primary) hypertension: Secondary | ICD-10-CM | POA: Diagnosis not present

## 2018-11-12 DIAGNOSIS — Z0000 Encounter for general adult medical examination without abnormal findings: Secondary | ICD-10-CM | POA: Diagnosis not present

## 2018-11-12 DIAGNOSIS — R1010 Upper abdominal pain, unspecified: Secondary | ICD-10-CM | POA: Diagnosis not present

## 2018-11-12 DIAGNOSIS — E785 Hyperlipidemia, unspecified: Secondary | ICD-10-CM | POA: Diagnosis not present

## 2018-12-02 ENCOUNTER — Telehealth: Payer: Self-pay

## 2018-12-02 NOTE — Telephone Encounter (Signed)
Needs follow up sleep appointment. No answer when called.

## 2019-01-05 DIAGNOSIS — R040 Epistaxis: Secondary | ICD-10-CM | POA: Diagnosis not present

## 2019-01-05 DIAGNOSIS — I10 Essential (primary) hypertension: Secondary | ICD-10-CM | POA: Diagnosis not present

## 2019-01-27 ENCOUNTER — Other Ambulatory Visit: Payer: Self-pay | Admitting: Obstetrics & Gynecology

## 2019-01-27 DIAGNOSIS — N6459 Other signs and symptoms in breast: Secondary | ICD-10-CM | POA: Diagnosis not present

## 2019-01-27 DIAGNOSIS — N6452 Nipple discharge: Secondary | ICD-10-CM | POA: Diagnosis not present

## 2019-01-28 ENCOUNTER — Other Ambulatory Visit: Payer: Self-pay

## 2019-01-28 ENCOUNTER — Ambulatory Visit
Admission: RE | Admit: 2019-01-28 | Discharge: 2019-01-28 | Disposition: A | Discharge status: Home / Self Care | Payer: Medicare Other | Source: Ambulatory Visit | Attending: Obstetrics & Gynecology | Admitting: Obstetrics & Gynecology

## 2019-01-28 ENCOUNTER — Other Ambulatory Visit: Payer: Self-pay | Admitting: Obstetrics & Gynecology

## 2019-01-28 DIAGNOSIS — N6452 Nipple discharge: Secondary | ICD-10-CM

## 2019-01-28 DIAGNOSIS — N6489 Other specified disorders of breast: Secondary | ICD-10-CM | POA: Diagnosis not present

## 2019-01-28 DIAGNOSIS — R928 Other abnormal and inconclusive findings on diagnostic imaging of breast: Secondary | ICD-10-CM | POA: Diagnosis not present

## 2019-02-01 ENCOUNTER — Ambulatory Visit
Admission: RE | Admit: 2019-02-01 | Discharge: 2019-02-01 | Disposition: A | Discharge status: Home / Self Care | Payer: Medicare Other | Source: Ambulatory Visit | Attending: Obstetrics & Gynecology | Admitting: Obstetrics & Gynecology

## 2019-02-01 ENCOUNTER — Other Ambulatory Visit: Payer: Self-pay

## 2019-02-01 DIAGNOSIS — N6452 Nipple discharge: Secondary | ICD-10-CM

## 2019-02-08 ENCOUNTER — Other Ambulatory Visit: Payer: Self-pay | Admitting: Obstetrics & Gynecology

## 2019-02-08 ENCOUNTER — Other Ambulatory Visit: Payer: Self-pay | Admitting: Internal Medicine

## 2019-02-08 DIAGNOSIS — N6452 Nipple discharge: Secondary | ICD-10-CM

## 2019-02-11 ENCOUNTER — Ambulatory Visit
Admission: RE | Admit: 2019-02-11 | Discharge: 2019-02-11 | Disposition: A | Discharge status: Home / Self Care | Payer: Medicare Other | Source: Ambulatory Visit | Attending: Obstetrics & Gynecology | Admitting: Obstetrics & Gynecology

## 2019-02-11 ENCOUNTER — Other Ambulatory Visit: Payer: Self-pay

## 2019-02-11 DIAGNOSIS — N6452 Nipple discharge: Secondary | ICD-10-CM | POA: Diagnosis not present

## 2019-02-11 MED ORDER — GADOBUTROL 1 MMOL/ML IV SOLN
10.0000 mL | Freq: Once | INTRAVENOUS | Status: AC | PRN
  Administered 2019-02-11: 10 mL via INTRAVENOUS

## 2019-02-15 DIAGNOSIS — Z0000 Encounter for general adult medical examination without abnormal findings: Secondary | ICD-10-CM | POA: Diagnosis not present

## 2019-02-15 DIAGNOSIS — Z23 Encounter for immunization: Secondary | ICD-10-CM | POA: Diagnosis not present

## 2019-02-15 DIAGNOSIS — E785 Hyperlipidemia, unspecified: Secondary | ICD-10-CM | POA: Diagnosis not present

## 2019-02-21 ENCOUNTER — Encounter: Payer: Self-pay | Admitting: Internal Medicine

## 2019-02-24 ENCOUNTER — Other Ambulatory Visit: Payer: Medicare Other

## 2019-03-09 DIAGNOSIS — Z6841 Body Mass Index (BMI) 40.0 and over, adult: Secondary | ICD-10-CM | POA: Diagnosis not present

## 2019-03-09 DIAGNOSIS — Z124 Encounter for screening for malignant neoplasm of cervix: Secondary | ICD-10-CM | POA: Diagnosis not present

## 2019-03-09 DIAGNOSIS — Z01419 Encounter for gynecological examination (general) (routine) without abnormal findings: Secondary | ICD-10-CM | POA: Diagnosis not present

## 2019-04-20 DIAGNOSIS — J441 Chronic obstructive pulmonary disease with (acute) exacerbation: Secondary | ICD-10-CM | POA: Diagnosis not present

## 2019-04-20 DIAGNOSIS — G4733 Obstructive sleep apnea (adult) (pediatric): Secondary | ICD-10-CM | POA: Diagnosis not present

## 2019-04-27 DIAGNOSIS — N6452 Nipple discharge: Secondary | ICD-10-CM | POA: Diagnosis not present

## 2019-05-03 ENCOUNTER — Ambulatory Visit: Payer: Medicare Other | Attending: Internal Medicine

## 2019-05-03 DIAGNOSIS — Z20822 Contact with and (suspected) exposure to covid-19: Secondary | ICD-10-CM

## 2019-05-04 LAB — NOVEL CORONAVIRUS, NAA: SARS-CoV-2, NAA: NOT DETECTED

## 2019-05-19 DIAGNOSIS — Z03818 Encounter for observation for suspected exposure to other biological agents ruled out: Secondary | ICD-10-CM | POA: Diagnosis not present

## 2019-05-19 DIAGNOSIS — Z20828 Contact with and (suspected) exposure to other viral communicable diseases: Secondary | ICD-10-CM | POA: Diagnosis not present

## 2019-06-17 ENCOUNTER — Ambulatory Visit: Payer: Medicare Other | Attending: Internal Medicine

## 2019-06-17 DIAGNOSIS — Z23 Encounter for immunization: Secondary | ICD-10-CM

## 2019-06-17 NOTE — Progress Notes (Signed)
   Covid-19 Vaccination Clinic  Name:  Michelle [name]    MRN: [id number] DOB: [birth year]  06/17/2019  [name] was observed post Covid-19 immunization for 15 minutes without incidence. She was provided with Vaccine Information Sheet and instruction to access the V-Safe system.   [name] was instructed to call 911 with any severe reactions post vaccine: Marland Kitchen Difficulty breathing  . Swelling of your face and throat  . A fast heartbeat  . A bad rash all over your body  . Dizziness and weakness    Immunizations Administered    Name Date Dose VIS Date Route   Pfizer COVID-19 Vaccine 06/17/2019  1:54 PM 0.3 mL 04/02/2019 Intramuscular   Manufacturer: [place]   Lot: [id number]   [place]: [id number]

## 2019-06-28 DIAGNOSIS — H5213 Myopia, bilateral: Secondary | ICD-10-CM | POA: Diagnosis not present

## 2019-07-13 ENCOUNTER — Ambulatory Visit: Payer: Medicare Other | Attending: Internal Medicine

## 2019-07-13 DIAGNOSIS — Z23 Encounter for immunization: Secondary | ICD-10-CM

## 2019-07-13 NOTE — Progress Notes (Signed)
   Covid-19 Vaccination Clinic  Name:  Michelle [name]    MRN: [id number] DOB: [birth year]  07/13/2019  [name] was observed post Covid-19 immunization for 15 minutes without incident. She was provided with Vaccine Information Sheet and instruction to access the V-Safe system.   [name] was instructed to call 911 with any severe reactions post vaccine: Marland Kitchen Difficulty breathing  . Swelling of face and throat  . A fast heartbeat  . A bad rash all over body  . Dizziness and weakness   Immunizations Administered    Name Date Dose VIS Date Route   Pfizer COVID-19 Vaccine 07/13/2019  2:12 PM 0.3 mL 04/02/2019 Intramuscular   Manufacturer: [place]   Lot: [id number]   [place]: [id number]

## 2019-07-15 ENCOUNTER — Encounter: Payer: Self-pay | Admitting: General Practice

## 2019-08-17 DIAGNOSIS — I10 Essential (primary) hypertension: Secondary | ICD-10-CM | POA: Diagnosis not present

## 2019-08-17 DIAGNOSIS — E559 Vitamin D deficiency, unspecified: Secondary | ICD-10-CM | POA: Diagnosis not present

## 2019-08-17 DIAGNOSIS — R5383 Other fatigue: Secondary | ICD-10-CM | POA: Diagnosis not present

## 2019-08-17 DIAGNOSIS — R7309 Other abnormal glucose: Secondary | ICD-10-CM | POA: Diagnosis not present

## 2019-08-24 DIAGNOSIS — R350 Frequency of micturition: Secondary | ICD-10-CM | POA: Diagnosis not present

## 2019-08-24 DIAGNOSIS — R3914 Feeling of incomplete bladder emptying: Secondary | ICD-10-CM | POA: Diagnosis not present

## 2019-08-24 DIAGNOSIS — R351 Nocturia: Secondary | ICD-10-CM | POA: Diagnosis not present

## 2019-08-24 DIAGNOSIS — N3941 Urge incontinence: Secondary | ICD-10-CM | POA: Diagnosis not present

## 2019-09-08 DIAGNOSIS — B961 Klebsiella pneumoniae [K. pneumoniae] as the cause of diseases classified elsewhere: Secondary | ICD-10-CM | POA: Diagnosis not present

## 2019-09-08 DIAGNOSIS — N3941 Urge incontinence: Secondary | ICD-10-CM | POA: Diagnosis not present

## 2019-09-08 DIAGNOSIS — R350 Frequency of micturition: Secondary | ICD-10-CM | POA: Diagnosis not present

## 2019-09-08 DIAGNOSIS — N390 Urinary tract infection, site not specified: Secondary | ICD-10-CM | POA: Diagnosis not present

## 2019-09-09 DIAGNOSIS — N390 Urinary tract infection, site not specified: Secondary | ICD-10-CM | POA: Diagnosis not present

## 2019-10-21 DIAGNOSIS — D1801 Hemangioma of skin and subcutaneous tissue: Secondary | ICD-10-CM | POA: Diagnosis not present

## 2019-10-21 DIAGNOSIS — D485 Neoplasm of uncertain behavior of skin: Secondary | ICD-10-CM | POA: Diagnosis not present

## 2019-10-21 DIAGNOSIS — D225 Melanocytic nevi of trunk: Secondary | ICD-10-CM | POA: Diagnosis not present

## 2019-10-21 DIAGNOSIS — L821 Other seborrheic keratosis: Secondary | ICD-10-CM | POA: Diagnosis not present

## 2019-11-18 DIAGNOSIS — L810 Postinflammatory hyperpigmentation: Secondary | ICD-10-CM | POA: Diagnosis not present

## 2019-12-20 DIAGNOSIS — Z0000 Encounter for general adult medical examination without abnormal findings: Secondary | ICD-10-CM | POA: Diagnosis not present

## 2019-12-20 DIAGNOSIS — Z1159 Encounter for screening for other viral diseases: Secondary | ICD-10-CM | POA: Diagnosis not present

## 2019-12-20 DIAGNOSIS — R739 Hyperglycemia, unspecified: Secondary | ICD-10-CM | POA: Diagnosis not present

## 2019-12-20 DIAGNOSIS — I10 Essential (primary) hypertension: Secondary | ICD-10-CM | POA: Diagnosis not present

## 2019-12-20 DIAGNOSIS — Z23 Encounter for immunization: Secondary | ICD-10-CM | POA: Diagnosis not present

## 2019-12-20 DIAGNOSIS — I776 Arteritis, unspecified: Secondary | ICD-10-CM | POA: Diagnosis not present

## 2019-12-20 DIAGNOSIS — E559 Vitamin D deficiency, unspecified: Secondary | ICD-10-CM | POA: Diagnosis not present

## 2020-01-24 ENCOUNTER — Other Ambulatory Visit: Payer: Self-pay

## 2020-01-24 DIAGNOSIS — D485 Neoplasm of uncertain behavior of skin: Secondary | ICD-10-CM | POA: Diagnosis not present

## 2020-01-24 DIAGNOSIS — L308 Other specified dermatitis: Secondary | ICD-10-CM | POA: Diagnosis not present

## 2020-01-24 DIAGNOSIS — R21 Rash and other nonspecific skin eruption: Secondary | ICD-10-CM | POA: Diagnosis not present

## 2020-02-28 DIAGNOSIS — M545 Low back pain, unspecified: Secondary | ICD-10-CM | POA: Diagnosis not present

## 2020-02-28 DIAGNOSIS — M4696 Unspecified inflammatory spondylopathy, lumbar region: Secondary | ICD-10-CM | POA: Diagnosis not present

## 2020-02-28 DIAGNOSIS — Z6841 Body Mass Index (BMI) 40.0 and over, adult: Secondary | ICD-10-CM | POA: Diagnosis not present

## 2020-02-28 DIAGNOSIS — M4316 Spondylolisthesis, lumbar region: Secondary | ICD-10-CM | POA: Diagnosis not present

## 2020-03-15 DIAGNOSIS — L820 Inflamed seborrheic keratosis: Secondary | ICD-10-CM | POA: Diagnosis not present

## 2020-03-15 DIAGNOSIS — L920 Granuloma annulare: Secondary | ICD-10-CM | POA: Diagnosis not present

## 2020-03-15 DIAGNOSIS — L821 Other seborrheic keratosis: Secondary | ICD-10-CM | POA: Diagnosis not present

## 2020-03-15 DIAGNOSIS — L578 Other skin changes due to chronic exposure to nonionizing radiation: Secondary | ICD-10-CM | POA: Diagnosis not present

## 2020-03-21 DIAGNOSIS — D7589 Other specified diseases of blood and blood-forming organs: Secondary | ICD-10-CM | POA: Diagnosis not present

## 2020-03-21 DIAGNOSIS — R7301 Impaired fasting glucose: Secondary | ICD-10-CM | POA: Diagnosis not present

## 2020-04-10 DIAGNOSIS — D7589 Other specified diseases of blood and blood-forming organs: Secondary | ICD-10-CM | POA: Diagnosis not present

## 2020-04-10 DIAGNOSIS — L920 Granuloma annulare: Secondary | ICD-10-CM | POA: Diagnosis not present

## 2020-04-10 DIAGNOSIS — M545 Low back pain, unspecified: Secondary | ICD-10-CM | POA: Diagnosis not present

## 2020-04-13 ENCOUNTER — Telehealth: Payer: Self-pay | Admitting: Hematology and Oncology

## 2020-04-13 NOTE — Telephone Encounter (Signed)
Received a new hem referral from Dr. Shelia Media for macrocytosis. Pt returned my call and has been scheduled to see Dr. Chryl Heck on 1/12 at 11am. Pt aware to arrive 30 minutes early.

## 2020-05-01 ENCOUNTER — Other Ambulatory Visit: Payer: Self-pay

## 2020-05-01 ENCOUNTER — Ambulatory Visit
Admission: RE | Admit: 2020-05-01 | Discharge: 2020-05-01 | Disposition: A | Discharge status: Home / Self Care | Payer: Medicare Other | Source: Ambulatory Visit | Attending: Family Medicine | Admitting: Family Medicine

## 2020-05-01 ENCOUNTER — Telehealth: Payer: Self-pay

## 2020-05-01 ENCOUNTER — Other Ambulatory Visit: Payer: Self-pay | Admitting: Family Medicine

## 2020-05-01 DIAGNOSIS — R051 Acute cough: Secondary | ICD-10-CM

## 2020-05-01 DIAGNOSIS — R059 Cough, unspecified: Secondary | ICD-10-CM | POA: Diagnosis not present

## 2020-05-01 NOTE — Telephone Encounter (Signed)
Patient called in to report positive for Covid and needs to re-schedule appt. Scheduling message sent.

## 2020-05-02 ENCOUNTER — Telehealth: Payer: Self-pay | Admitting: Hematology and Oncology

## 2020-05-02 NOTE — Telephone Encounter (Signed)
Pt has been cld and rescheduled to see Dr. Chryl Heck on 2/1 at 1pm due to test positive for covid.

## 2020-05-03 ENCOUNTER — Inpatient Hospital Stay: Payer: Medicare Other

## 2020-05-03 ENCOUNTER — Inpatient Hospital Stay: Payer: Medicare Other | Admitting: Hematology and Oncology

## 2020-05-03 DIAGNOSIS — Z01419 Encounter for gynecological examination (general) (routine) without abnormal findings: Secondary | ICD-10-CM | POA: Diagnosis not present

## 2020-05-03 DIAGNOSIS — Z6841 Body Mass Index (BMI) 40.0 and over, adult: Secondary | ICD-10-CM | POA: Diagnosis not present

## 2020-05-03 DIAGNOSIS — Z124 Encounter for screening for malignant neoplasm of cervix: Secondary | ICD-10-CM | POA: Diagnosis not present

## 2020-05-03 DIAGNOSIS — Z1231 Encounter for screening mammogram for malignant neoplasm of breast: Secondary | ICD-10-CM | POA: Diagnosis not present

## 2020-05-04 DIAGNOSIS — U071 COVID-19: Secondary | ICD-10-CM | POA: Diagnosis not present

## 2020-05-04 DIAGNOSIS — R059 Cough, unspecified: Secondary | ICD-10-CM | POA: Diagnosis not present

## 2020-05-23 ENCOUNTER — Encounter: Payer: Self-pay | Admitting: Hematology and Oncology

## 2020-05-23 ENCOUNTER — Other Ambulatory Visit: Payer: Self-pay

## 2020-05-23 ENCOUNTER — Inpatient Hospital Stay: Payer: Medicare Other | Attending: Hematology and Oncology | Admitting: Hematology and Oncology

## 2020-05-23 ENCOUNTER — Other Ambulatory Visit: Payer: Self-pay | Admitting: Hematology and Oncology

## 2020-05-23 ENCOUNTER — Inpatient Hospital Stay: Payer: Medicare Other

## 2020-05-23 VITALS — BP 147/94 | HR 87 | Temp 98.5°F | Resp 18 | Ht 61.0 in | Wt 230.1 lb

## 2020-05-23 DIAGNOSIS — Z90721 Acquired absence of ovaries, unilateral: Secondary | ICD-10-CM

## 2020-05-23 DIAGNOSIS — E559 Vitamin D deficiency, unspecified: Secondary | ICD-10-CM | POA: Insufficient documentation

## 2020-05-23 DIAGNOSIS — E785 Hyperlipidemia, unspecified: Secondary | ICD-10-CM | POA: Diagnosis not present

## 2020-05-23 DIAGNOSIS — I10 Essential (primary) hypertension: Secondary | ICD-10-CM | POA: Diagnosis not present

## 2020-05-23 DIAGNOSIS — F419 Anxiety disorder, unspecified: Secondary | ICD-10-CM | POA: Diagnosis not present

## 2020-05-23 DIAGNOSIS — D7589 Other specified diseases of blood and blood-forming organs: Secondary | ICD-10-CM

## 2020-05-23 DIAGNOSIS — F32A Depression, unspecified: Secondary | ICD-10-CM

## 2020-05-23 DIAGNOSIS — Z87891 Personal history of nicotine dependence: Secondary | ICD-10-CM | POA: Insufficient documentation

## 2020-05-23 DIAGNOSIS — Z79899 Other long term (current) drug therapy: Secondary | ICD-10-CM | POA: Insufficient documentation

## 2020-05-23 DIAGNOSIS — K589 Irritable bowel syndrome without diarrhea: Secondary | ICD-10-CM | POA: Diagnosis not present

## 2020-05-23 DIAGNOSIS — G4733 Obstructive sleep apnea (adult) (pediatric): Secondary | ICD-10-CM

## 2020-05-23 DIAGNOSIS — Z8249 Family history of ischemic heart disease and other diseases of the circulatory system: Secondary | ICD-10-CM | POA: Diagnosis not present

## 2020-05-23 LAB — CBC WITH DIFFERENTIAL/PLATELET
Abs Immature Granulocytes: 0.01 10*3/uL (ref 0.00–0.07)
Basophils Absolute: 0.1 10*3/uL (ref 0.0–0.1)
Basophils Relative: 1 %
Eosinophils Absolute: 0.1 10*3/uL (ref 0.0–0.5)
Eosinophils Relative: 3 %
HCT: 41.8 % (ref 36.0–46.0)
Hemoglobin: 14.6 g/dL (ref 12.0–15.0)
Immature Granulocytes: 0 %
Lymphocytes Relative: 35 %
Lymphs Abs: 1.8 10*3/uL (ref 0.7–4.0)
MCH: 34.4 pg — ABNORMAL HIGH (ref 26.0–34.0)
MCHC: 34.9 g/dL (ref 30.0–36.0)
MCV: 98.6 fL (ref 80.0–100.0)
Monocytes Absolute: 0.7 10*3/uL (ref 0.1–1.0)
Monocytes Relative: 14 %
Neutro Abs: 2.5 10*3/uL (ref 1.7–7.7)
Neutrophils Relative %: 47 %
Platelets: 164 10*3/uL (ref 150–400)
RBC: 4.24 MIL/uL (ref 3.87–5.11)
RDW: 11.9 % (ref 11.5–15.5)
WBC: 5.3 10*3/uL (ref 4.0–10.5)
nRBC: 0 % (ref 0.0–0.2)

## 2020-05-23 LAB — CMP (CANCER CENTER ONLY)
ALT: 46 U/L — ABNORMAL HIGH (ref 0–44)
AST: 30 U/L (ref 15–41)
Albumin: 4 g/dL (ref 3.5–5.0)
Alkaline Phosphatase: 84 U/L (ref 38–126)
Anion gap: 9 (ref 5–15)
BUN: 23 mg/dL (ref 8–23)
CO2: 27 mmol/L (ref 22–32)
Calcium: 9.3 mg/dL (ref 8.9–10.3)
Chloride: 104 mmol/L (ref 98–111)
Creatinine: 1.03 mg/dL — ABNORMAL HIGH (ref 0.44–1.00)
GFR, Estimated: 58 mL/min — ABNORMAL LOW (ref >60)
Glucose, Bld: 100 mg/dL — ABNORMAL HIGH (ref 70–99)
Potassium: 4.8 mmol/L (ref 3.5–5.1)
Sodium: 140 mmol/L (ref 135–145)
Total Bilirubin: 0.7 mg/dL (ref 0.3–1.2)
Total Protein: 7.6 g/dL (ref 6.5–8.1)

## 2020-05-23 LAB — IRON AND TIBC
Iron: 168 ug/dL — ABNORMAL HIGH (ref 41–142)
Saturation Ratios: 50 % (ref 21–57)
TIBC: 334 ug/dL (ref 236–444)
UIBC: 166 ug/dL (ref 120–384)

## 2020-05-23 LAB — LACTATE DEHYDROGENASE: LDH: 238 U/L — ABNORMAL HIGH (ref 98–192)

## 2020-05-23 LAB — RETICULOCYTES
Immature Retic Fract: 10.6 % (ref 2.3–15.9)
RBC.: 4.24 MIL/uL (ref 3.87–5.11)
Retic Count, Absolute: 114.9 10*3/uL (ref 19.0–186.0)
Retic Ct Pct: 2.7 % (ref 0.4–3.1)

## 2020-05-23 LAB — VITAMIN B12: Vitamin B-12: 488 pg/mL (ref 180–914)

## 2020-05-23 LAB — FERRITIN: Ferritin: 1103 ng/mL — ABNORMAL HIGH (ref 11–307)

## 2020-05-23 NOTE — Progress Notes (Signed)
Michelle [name] CONSULT NOTE  Patient Care Team: Michelle [name], Michelle [name], Michelle [name], Michelle [name], Michelle [name] were placed in this encounter.  This is a very pleasant 75 year old female patient with past medical history significant for hypertension, dyslipidemia chronic lower back pain referred to hematology for evaluation of macrocytosis.  Michelle [name] arrived to the appointment today by herself.  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 Physical examination unremarkable except for obesity, no palpable lymphadenopathy or hepatosplenomegaly. I have reviewed labs for the past several years, longstanding macrocytosis without any evidence of cytopenias.  We have reviewed causes of macrocytosis including but not limited to medications, hypothyroidism, nutritional deficiencies, alcohol and bone marrow disorders.  Given her longstanding history of wine intake and no other evidence of cytopenias, I believe this is likely from her alcohol intake.  We have recommended some repeat labs including CBC, smear review, iron panel, B12, folic acid, LDH and reticulocyte count today. If the above-mentioned labs are normal, recommend return to clinic in 4 months. Advised her to follow-up with her PCP for management of the skin rash and hypertension. Thank you for consulting [place] in the care of this patient.  Please do not hesitate to contact us with any additional questions or concerns.  HISTORY OF PRESENTING ILLNESS:  Michelle [name] 75 y.o. female is here because of macrocytosis.  This is a very pleasant 75 year old female patient with past medical history  significant for hypertension, dyslipidemia, chronic lower back pain referred to hematology for evaluation of macrocytosis.  Patient did not know the reason for the consult.  She brought up a questionable biopsy of her skin and was wondering if we were going to help her with the skin rash that was recently diagnosed.  She complains of this progressive skin rash for the past year, correlates it to the time when she had her second dose of 5 vaccine and [name] seen a dermatologist who suggested a biopsy.  The skin rash does not hurt her and is not associated with pruritus.  She denies any B symptoms, change in breathing except for some underlying cough since she contacted Covid about a month ago, no change in urinary habits.  No hematochezia or melena.  No new neurological complaints.  She [name] been taking B12 supplementation for a long time.  She drinks about 2 glasses of wine every other day and a bit more on the weekends and this [name] been a longstanding habit.  The only new medication that she can think of is losartan which she started about a year ago.  No other over-the-counter or herbal supplements.  Rest of the pertinent 10 point ROS as mentioned below and negative.  REVIEW OF SYSTEMS:   Constitutional: Denies fevers, chills or abnormal night sweats Eyes: Denies blurriness of vision, double vision or watery eyes Ears, nose, mouth, throat, and face: Denies mucositis or sore throat Respiratory: Denies cough, dyspnea or wheezes Cardiovascular: Denies palpitation, chest discomfort or lower extremity swelling Gastrointestinal:  Denies nausea, heartburn or change in bowel habits Skin: Denies abnormal skin rashes Lymphatics: Denies new lymphadenopathy or easy bruising Neurological:Denies numbness, tingling or new weaknesses Behavioral/Psych: Mood is stable, no new  changes  All other systems were reviewed with the patient and are negative.  MEDICAL HISTORY:  Past Medical History:  Diagnosis Date   Acute  hypoxemic respiratory failure ([place]) 04/24/2017   Anxiety    Arthritis    "back" (04/24/2017)   Blurry vision, left eye    "YAG on the left; to be repaired" (04/24/2017)   Chondromalacia of knee    right knee   Chronic bronchitis ([facility])    Chronic lower back pain    DDD (degenerative disc disease) 07-24-11   Osteoarthritis-s/p LTKA 2'11, now right planned   Depression 07-24-11   tx. depression only   Fatty liver    History of diverticulitis 02/2018   Hx of adenomatous polyp of colon 03/21/2014   Hyperlipidemia    Hypertension    IBS (irritable bowel syndrome)    OSA on CPAP 12/07/2016   Pneumonia 1990s X 1   SOB (shortness of breath)    Vitamin D deficiency     SURGICAL HISTORY: Past Surgical History:  Procedure Laterality Date   APPENDECTOMY  1967   BOTOX INJECTION     "face"   CATARACT EXTRACTION W/ INTRAOCULAR LENS IMPLANT Bilateral 2000s   CESAREAN SECTION  1972; 1975   COLONOSCOPY W/ BIOPSIES AND POLYPECTOMY  "a few"   DILATION AND CURETTAGE OF UTERUS     HYSTEROSCOPY WITH D & C  08/29/2006   and resection of endometrial polyp/notes 09/10/2010   JOINT REPLACEMENT     KNEE ARTHROSCOPY Bilateral    NASAL SEPTUM SURGERY  1972   OOPHORECTOMY Right 1994   TOTAL KNEE ARTHROPLASTY  08/05/2011   Procedure: TOTAL KNEE ARTHROPLASTY;  Surgeon: Gearlean Alf, Michelle;  Location: [facility];  Service: Orthopedics;  Laterality: Right;   TOTAL KNEE ARTHROPLASTY Left 05/2009   TUBAL LIGATION  1975    SOCIAL HISTORY: Social History   Socioeconomic History   Marital status: Divorced    Spouse name: Not on file   Number of children: 2   Years of education: Not on file   Highest education level: Not on file  Occupational History   Occupation: Retired Secretary/administrator  Tobacco Use   Smoking status: Former Smoker    Packs/day: 0.12    Years: 36.00    Pack years: 4.32    [name]: Cigarettes    Quit date: 06/19/2014    Years since quitting: 5.9   Smokeless  tobacco: Never Used  Vaping Use   Vaping Use: Never used  Substance and Sexual Activity   Alcohol use: Yes    Alcohol/week: 21.0 standard drinks    [name]: 21 Glasses of wine per week    Comment: 04/24/2017 "3, glasses of white wine qd"   Drug use: No   Sexual activity: Never  Other Topics Concern   Not on file  Social History Narrative   Not on file   Social Determinants of Health   Financial Resource Strain: Not on file  Food Insecurity: Not on file  Transportation Needs: Not on file  Physical Activity: Not on file  Stress: Not on file  Social Connections: Not on file  Intimate Partner Violence: Not on file    FAMILY HISTORY: Family History  Problem Relation Age of Onset   Heart disease Mother        Rheumatic heart disease   Pulmonary embolism Mother    Heart disease Father        valvular heart disease   Stroke Father    Hypertension  Sister    Colon cancer Neg Hx    Esophageal cancer Neg Hx    Rectal cancer Neg Hx    Stomach cancer Neg Hx    Pancreatic cancer Neg Hx     ALLERGIES:  [name] No Known Allergies.  MEDICATIONS:  Current Outpatient Medications  Medication Sig Dispense Refill   Ascorbic Acid (VITAMIN C) 100 MG tablet Take 100 mg by mouth daily.     CALCIUM CITRATE PO Take 1 tablet by mouth daily.     cholecalciferol (VITAMIN D3) 25 MCG (1000 UT) tablet Take 1,000 Units by mouth daily.     losartan (COZAAR) 25 MG tablet Take 25 mg by mouth daily.   2   sertraline (ZOLOFT) 100 MG tablet Take 100 mg by mouth 2 (two) times a day.   0   vitamin B-12 (CYANOCOBALAMIN) 100 MCG tablet Take 100 mcg by mouth daily.     No current facility-administered medications for this visit.     PHYSICAL EXAMINATION:  ECOG PERFORMANCE STATUS: 0 - Asymptomatic  There were no vitals filed for this visit. There were no vitals filed for this visit.  GENERAL:alert, no distress and comfortable SKIN: skin color, texture, turgor are normal, no rashes or  significant lesions EYES: normal, conjunctiva are pink and non-injected, sclera clear OROPHARYNX:no exudate, no erythema and lips, buccal mucosa, and tongue normal  NECK: supple, thyroid normal size, non-tender, without nodularity LYMPH:  no palpable lymphadenopathy in the cervical, axillary or inguinal LUNGS: clear to auscultation and percussion with normal breathing effort HEART: regular rate & rhythm and no murmurs and no lower extremity edema ABDOMEN:abdomen soft, non-tender and normal bowel sounds.  No splenomegaly Musculoskeletal:no cyanosis of digits and no clubbing  PSYCH: alert & oriented x 3 with fluent speech NEURO: no focal motor/sensory deficits Skin: Large macular eruptions noted scattered on the torso.  LABORATORY DATA:  I have reviewed the data as listed Lab Results  Component Value Date   WBC 7.3 08/09/2018   HGB 13.8 08/09/2018   HCT 41.8 08/09/2018   MCV 100.2 (H) 08/09/2018   PLT 184 08/09/2018     Chemistry      Component Value Date/Time   NA 133 (L) 08/09/2018 1835   NA 139 02/18/2018 1217   K 3.8 08/09/2018 1835   CL 104 08/09/2018 1835   CO2 20 (L) 08/09/2018 1835   BUN 24 (H) 08/09/2018 1835   BUN 30 (H) 02/18/2018 1217   CREATININE 1.00 08/09/2018 1835      Component Value Date/Time   CALCIUM 8.8 (L) 08/09/2018 1835   ALKPHOS 63 08/09/2018 1835   AST 36 08/09/2018 1835   ALT 61 (H) 08/09/2018 1835   BILITOT 0.9 08/09/2018 1835   BILITOT 0.5 02/18/2018 1217       RADIOGRAPHIC STUDIES: I have personally reviewed the radiological images as listed and agreed with the findings in the report. DG Chest 2 View  Result Date: 05/01/2020 CLINICAL DATA:  75 year old female with cough. EXAM: CHEST - 2 VIEW COMPARISON:  Chest radiograph dated 08/09/2018. FINDINGS: There is diffuse interstitial coarsening. Bilateral mid to lower lung field as well as right subpleural faint interstitial densities may be chronic or represent atypical infection. Clinical  correlation is recommended. No focal consolidation, pleural effusion or pneumothorax. The cardiac silhouette is within limits. No acute osseous pathology. IMPRESSION: Chronic interstitial coarsening versus atypical infection. No focal consolidation. Electronically Signed   By: Elgie Collard M.D.   On: 05/01/2020 16:25  All questions were answered. The patient knows to call the clinic with any problems, questions or concerns. I spent 45 minutes in the care of this patient including H and P, review of records, counseling and coordination of care.     Benay Pike, Michelle 05/23/2020 1:10 PM

## 2020-05-23 NOTE — Progress Notes (Signed)
Hemochromatosis panel ordered.

## 2020-05-24 ENCOUNTER — Telehealth: Payer: Self-pay

## 2020-05-24 LAB — FOLATE RBC
Folate, Hemolysate: >620 ng/mL
Folate, RBC: >1442 ng/mL (ref >498)
Hematocrit: 43 % (ref 34.0–46.6)

## 2020-05-24 NOTE — Telephone Encounter (Signed)
-----   Message from Benay Pike, MD sent at 05/23/2020  4:05 PM EST ----- Myriam Jacobson  If you can convey  Size of RBC has improved, so this is not of much concern Like we discussed, her liver enzymes are elevated, she needs to cut down on her alcohol intake. Then, I ordered another test to see if she has iron overload disorder, this was not suspected hence not drawn, if she can get this done, not urgent, in the next 1-2 weeks is ok.  Thanks,

## 2020-05-24 NOTE — Telephone Encounter (Signed)
The Patient has been made aware of results and verbalized understanding. She is aware that she will need to come in next week for further lab work. A scheduling message has been sent to arrange a time for the patient to come in for labs next week.

## 2020-05-26 ENCOUNTER — Telehealth: Payer: Self-pay | Admitting: Hematology and Oncology

## 2020-05-26 LAB — HEMOCHROMATOSIS DNA-PCR(C282Y,H63D)

## 2020-05-26 NOTE — Telephone Encounter (Signed)
Scheduled appointment per 2/4 inbasket msg from Dr. Chryl Heck. Spoke to patient who is aware of appointment date and time.

## 2020-05-29 ENCOUNTER — Other Ambulatory Visit: Payer: Self-pay

## 2020-05-29 ENCOUNTER — Inpatient Hospital Stay: Payer: Medicare Other

## 2020-05-29 DIAGNOSIS — D7589 Other specified diseases of blood and blood-forming organs: Secondary | ICD-10-CM | POA: Diagnosis not present

## 2020-05-29 LAB — CBC WITH DIFFERENTIAL/PLATELET
Abs Immature Granulocytes: 0.01 10*3/uL (ref 0.00–0.07)
Basophils Absolute: 0.1 10*3/uL (ref 0.0–0.1)
Basophils Relative: 1 %
Eosinophils Absolute: 0.1 10*3/uL (ref 0.0–0.5)
Eosinophils Relative: 3 %
HCT: 40.8 % (ref 36.0–46.0)
Hemoglobin: 14.1 g/dL (ref 12.0–15.0)
Immature Granulocytes: 0 %
Lymphocytes Relative: 35 %
Lymphs Abs: 1.8 10*3/uL (ref 0.7–4.0)
MCH: 34 pg (ref 26.0–34.0)
MCHC: 34.6 g/dL (ref 30.0–36.0)
MCV: 98.3 fL (ref 80.0–100.0)
Monocytes Absolute: 0.7 10*3/uL (ref 0.1–1.0)
Monocytes Relative: 12 %
Neutro Abs: 2.6 10*3/uL (ref 1.7–7.7)
Neutrophils Relative %: 49 %
Platelets: 162 10*3/uL (ref 150–400)
RBC: 4.15 MIL/uL (ref 3.87–5.11)
RDW: 11.8 % (ref 11.5–15.5)
WBC: 5.3 10*3/uL (ref 4.0–10.5)
nRBC: 0 % (ref 0.0–0.2)

## 2020-05-30 ENCOUNTER — Ambulatory Visit
Admission: RE | Admit: 2020-05-30 | Discharge: 2020-05-30 | Disposition: A | Discharge status: Home / Self Care | Payer: Medicare Other | Source: Ambulatory Visit | Attending: Family Medicine | Admitting: Family Medicine

## 2020-05-30 ENCOUNTER — Other Ambulatory Visit: Payer: Self-pay | Admitting: Family Medicine

## 2020-05-30 DIAGNOSIS — R0602 Shortness of breath: Secondary | ICD-10-CM | POA: Diagnosis not present

## 2020-05-30 DIAGNOSIS — R053 Chronic cough: Secondary | ICD-10-CM

## 2020-05-30 DIAGNOSIS — R059 Cough, unspecified: Secondary | ICD-10-CM | POA: Diagnosis not present

## 2020-06-15 DIAGNOSIS — L930 Discoid lupus erythematosus: Secondary | ICD-10-CM | POA: Diagnosis not present

## 2020-06-15 DIAGNOSIS — L920 Granuloma annulare: Secondary | ICD-10-CM | POA: Diagnosis not present

## 2020-06-15 DIAGNOSIS — D225 Melanocytic nevi of trunk: Secondary | ICD-10-CM | POA: Diagnosis not present

## 2020-06-15 DIAGNOSIS — R21 Rash and other nonspecific skin eruption: Secondary | ICD-10-CM | POA: Diagnosis not present

## 2020-06-19 ENCOUNTER — Other Ambulatory Visit: Payer: Self-pay

## 2020-06-19 ENCOUNTER — Encounter: Payer: Self-pay | Admitting: Hematology and Oncology

## 2020-06-19 ENCOUNTER — Telehealth: Payer: Self-pay | Admitting: Hematology and Oncology

## 2020-06-19 ENCOUNTER — Inpatient Hospital Stay: Payer: Medicare Other | Admitting: Hematology and Oncology

## 2020-06-19 VITALS — BP 155/83 | HR 79 | Temp 97.8°F | Resp 19 | Ht 61.0 in | Wt 233.2 lb

## 2020-06-19 DIAGNOSIS — E785 Hyperlipidemia, unspecified: Secondary | ICD-10-CM | POA: Diagnosis not present

## 2020-06-19 DIAGNOSIS — F419 Anxiety disorder, unspecified: Secondary | ICD-10-CM | POA: Diagnosis not present

## 2020-06-19 DIAGNOSIS — G4733 Obstructive sleep apnea (adult) (pediatric): Secondary | ICD-10-CM | POA: Diagnosis not present

## 2020-06-19 DIAGNOSIS — D7589 Other specified diseases of blood and blood-forming organs: Secondary | ICD-10-CM | POA: Diagnosis not present

## 2020-06-19 DIAGNOSIS — Z87891 Personal history of nicotine dependence: Secondary | ICD-10-CM | POA: Diagnosis not present

## 2020-06-19 DIAGNOSIS — I10 Essential (primary) hypertension: Secondary | ICD-10-CM | POA: Diagnosis not present

## 2020-06-19 DIAGNOSIS — F32A Depression, unspecified: Secondary | ICD-10-CM | POA: Diagnosis not present

## 2020-06-19 DIAGNOSIS — E559 Vitamin D deficiency, unspecified: Secondary | ICD-10-CM | POA: Diagnosis not present

## 2020-06-19 NOTE — Telephone Encounter (Signed)
Scheduled appointments per 2/28 los. Spoke to patient who is aware of appointments dates and times. Gave patient calendar print out. Scheduled appointment in May for 2nd week per patient request.

## 2020-06-19 NOTE — Progress Notes (Signed)
[place] NOTE  Patient Care Team: London Pepper, MD as PCP - General (Family Medicine) End, Harrell Gave, MD as PCP - Cardiology (Cardiology) Gaynelle Arabian, MD (Orthopedic Surgery)  CHIEF COMPLAINTS/PURPOSE OF CONSULTATION:   Macrocytosis  ASSESSMENT & PLAN:  No problem-specific Assessment & Plan notes found for this encounter.  No orders of the defined types were placed in this encounter.  This is a very pleasant 75 year old female patient with past medical history significant for hypertension, dyslipidemia chronic lower back pain referred to hematology for evaluation of macrocytosis.    1. Macrocytosis without anemia Likely from alcohol, this appears to be within normal limits now. Encouraged to cut down to 1 glass a day. No evidence of B12/folate deficiency or hemolysis.  2. 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 She is agreeable to this. I have recommended donation, but she says she didn't have a good experience with [facility], so she doesn't want to donate. Will schedule for monthly phlebotomy. Discussed that she should stop taking iron supplementation, use of cast iron pans and eating less red meat and cutting down alcohol  3. Age appropriate cancer screening.  4. Morbid obesity, previously encouraged portion control.  She was encouraged to contact us for sooner appointment if pathology from skin biopsy indicative of hematological reason.  HISTORY OF PRESENTING ILLNESS:   Michelle [name] 75 y.o. female is here because of macrocytosis.  This is a very pleasant 75 year old female patient with past medical history significant for hypertension, dyslipidemia, chronic lower back pain referred to hematology for evaluation of macrocytosis.  Patient did not know the reason for the consult.  She brought up  a questionable biopsy of her skin and was wondering if we were going to help her with the skin rash that was recently diagnosed.  She complains of this progressive skin rash for the past year, correlates it to the time when she had her second dose of Pfizer vaccine and has seen a dermatologist who suggested a biopsy.  The skin rash does not hurt her and is not associated with pruritus.  She denies any B symptoms, change in breathing except for some underlying cough since she contacted Covid about a month ago, no change in urinary habits.  No hematochezia or melena.  No new neurological complaints.  She has been taking B12 supplementation for a long time.  She drinks about 2 glasses of wine every other day and a bit more on the weekends and this has been a longstanding habit. No other over-the-counter or herbal supplements.    Interim History She had a skin biopsy since last visit, awaiting pathology results Skin rash appears to be expanding according to her. No other complaints. She has cut down to 1 glass of wine a day.  Rest of the pertinent 10 point ROS as mentioned below and negative.  REVIEW OF SYSTEMS:   Constitutional: Denies fevers, chills or abnormal night sweats Eyes: Denies blurriness of vision, double vision or watery eyes Ears, nose, mouth, throat, and face: Denies mucositis or sore throat Respiratory: Denies cough, dyspnea or wheezes Cardiovascular: Denies palpitation, chest discomfort or lower extremity swelling Gastrointestinal:  Denies nausea, heartburn or change in bowel habits Skin: Denies abnormal skin rashes Lymphatics: Denies new lymphadenopathy or easy bruising Neurological:Denies numbness, tingling or new weaknesses Behavioral/Psych: Mood is stable, no new changes  All other systems were  reviewed with the patient and are negative.  MEDICAL HISTORY:  Past Medical History:  Diagnosis Date  . Acute hypoxemic respiratory failure ([place]) 04/24/2017  . Anxiety   .  Arthritis    "back" (04/24/2017)  . 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 Chondromalacia of knee    right knee  . Chronic bronchitis ([place])   . Chronic lower back pain   . DDD (degenerative disc disease) 07-24-11   Osteoarthritis-s/p LTKA 2'11, now right planned  . Depression 07-24-11   tx. depression only  . Fatty liver   . History of diverticulitis 02/2018  . Hx of adenomatous polyp of colon 03/21/2014  . Hyperlipidemia   . Hypertension   . IBS (irritable bowel syndrome)   . OSA on CPAP 12/07/2016  . Pneumonia 1990s X 1  . SOB (shortness of breath)   . Vitamin D deficiency     SURGICAL HISTORY: Past Surgical History:  Procedure Laterality Date  . APPENDECTOMY  1967  . BOTOX INJECTION     "face"  . CATARACT EXTRACTION W/ INTRAOCULAR LENS IMPLANT Bilateral 2000s  . [place]; 1975  . COLONOSCOPY W/ BIOPSIES AND POLYPECTOMY  "a few"  . DILATION AND CURETTAGE OF UTERUS    . HYSTEROSCOPY WITH D & C  08/29/2006   and resection of endometrial polyp/notes 09/10/2010  . JOINT REPLACEMENT    . KNEE ARTHROSCOPY Bilateral   . NASAL SEPTUM SURGERY  1972  . OOPHORECTOMY Right 1994  . TOTAL KNEE ARTHROPLASTY  08/05/2011   Procedure: TOTAL KNEE ARTHROPLASTY;  Surgeon: Gearlean Alf, MD;  Location: [facility];  Service: Orthopedics;  Laterality: Right;  . TOTAL KNEE ARTHROPLASTY Left 05/2009  . TUBAL LIGATION  1975    SOCIAL HISTORY: Social History   Socioeconomic History  . Marital status: Divorced    Spouse name: Not on file  . Number of children: 2  . Years of education: Not on file  . Highest education level: Not on file  Occupational History  . Occupation: Retired Secretary/administrator  Tobacco Use  . Smoking status: Former Smoker    Packs/day: 0.12    Years: 36.00    Pack years: 4.32    Types: Cigarettes    Quit date: 06/19/2014    Years since quitting: 6.0  . Smokeless tobacco: Never Used  Vaping Use  . Vaping Use: Never used   Substance and Sexual Activity  . Alcohol use: Yes    Alcohol/week: 21.0 standard drinks    Types: 21 Glasses of wine per week    Comment: 04/24/2017 "3, glasses of white wine qd"  . Drug use: No  . Sexual activity: Never  Other Topics Concern  . Not on file  Social History Narrative  . Not on file   Social Determinants of Health   Financial Resource Strain: Not on file  Food Insecurity: Not on file  Transportation Needs: Not on file  Physical Activity: Not on file  Stress: Not on file  Social Connections: Not on file  Intimate Partner Violence: Not on file    FAMILY HISTORY: Family History  Problem Relation Age of Onset  . Heart disease Mother        Rheumatic heart disease  . Pulmonary embolism Mother   . Heart disease Father        valvular heart disease  . Stroke Father   . Hypertension Sister   . Colon cancer  Neg Hx   . Esophageal cancer Neg Hx   . Rectal cancer Neg Hx   . Stomach cancer Neg Hx   . Pancreatic cancer Neg Hx     ALLERGIES:  has No Known Allergies.  MEDICATIONS:  Current Outpatient Medications  Medication Sig Dispense Refill  . cholecalciferol (VITAMIN D3) 25 MCG (1000 UT) tablet Take 1,000 Units by mouth daily.    Marland Kitchen losartan (COZAAR) 50 MG tablet Take 50 mg by mouth daily.    Marland Kitchen pyridOXINE (VITAMIN B-6) 100 MG tablet Take 100 mg by mouth daily.    . sertraline (ZOLOFT) 100 MG tablet Take 100 mg by mouth 2 (two) times a day.   0  . vitamin B-12 (CYANOCOBALAMIN) 100 MCG tablet Take 100 mcg by mouth daily.    . Zinc 25 MG TABS Take 1 tablet by mouth daily.     No current facility-administered medications for this visit.     PHYSICAL EXAMINATION:  ECOG PERFORMANCE STATUS: 0 - Asymptomatic  Vitals:   06/19/20 1002  BP: (!) 155/83  Pulse: 79  Resp: 19  Temp: 97.8 F (36.6 C)  SpO2: 97%   Filed Weights   06/19/20 1002  Weight: 233 lb 3.2 oz (105.8 kg)    GENERAL:alert, no distress and comfortable SKIN: skin color, texture, turgor  are normal, no rashes or significant lesions EYES: normal, conjunctiva are pink and non-injected, sclera clear OROPHARYNX:no exudate, no erythema and lips, buccal mucosa, and tongue normal  NECK: supple, thyroid normal size, non-tender, without nodularity LYMPH:  no palpable lymphadenopathy in the cervical, axillary or inguinal LUNGS: clear to auscultation and percussion with normal breathing effort HEART: regular rate & rhythm and no murmurs and no lower extremity edema ABDOMEN:abdomen soft, non-tender and normal bowel sounds.  No splenomegaly Musculoskeletal:no cyanosis of digits and no clubbing  PSYCH: alert & oriented x 3 with fluent speech NEURO: no focal motor/sensory deficits Skin: Large macular eruptions noted scattered on the torso.  LABORATORY DATA:  I have reviewed the data as listed Lab Results  Component Value Date   WBC 5.3 05/29/2020   HGB 14.1 05/29/2020   HCT 40.8 05/29/2020   MCV 98.3 05/29/2020   PLT 162 05/29/2020     Chemistry      Component Value Date/Time   NA 140 05/23/2020 1413   NA 139 02/18/2018 1217   K 4.8 05/23/2020 1413   CL 104 05/23/2020 1413   CO2 27 05/23/2020 1413   BUN 23 05/23/2020 1413   BUN 30 (H) 02/18/2018 1217   CREATININE 1.03 (H) 05/23/2020 1413      Component Value Date/Time   CALCIUM 9.3 05/23/2020 1413   ALKPHOS 84 05/23/2020 1413   AST 30 05/23/2020 1413   ALT 46 (H) 05/23/2020 1413   BILITOT 0.7 05/23/2020 1413     Reviewed labs No nutritional deficiency. MCV on most recent labs normal No evidence of hemolysis. Hemochromatosis panel showed that she is a carrie for C282 Y Ferritin of 1100 Elevated LFT's  RADIOGRAPHIC STUDIES: I have personally reviewed the radiological images as listed and agreed with the findings in the report. DG Chest 2 View  Result Date: 05/31/2020 CLINICAL DATA:  Shortness of breath, cough. EXAM: CHEST - 2 VIEW COMPARISON:  May 01, 2020. FINDINGS: The heart size and mediastinal contours  are within normal limits. Both lungs are clear. No pneumothorax or pleural effusion is noted. The visualized skeletal structures are unremarkable. IMPRESSION: No active cardiopulmonary disease. Electronically Signed  By: Marijo Conception M.D.   On: 05/31/2020 08:30    All questions were answered. The patient knows to call the clinic with any problems, questions or concerns. I spent 30 minutes in the care of this patient including H and P, review of records, counseling and coordination of care.     Benay Pike, MD 06/19/2020 1:05 PM

## 2020-06-22 DIAGNOSIS — I10 Essential (primary) hypertension: Secondary | ICD-10-CM | POA: Diagnosis not present

## 2020-06-22 DIAGNOSIS — D7589 Other specified diseases of blood and blood-forming organs: Secondary | ICD-10-CM | POA: Diagnosis not present

## 2020-06-22 DIAGNOSIS — F3341 Major depressive disorder, recurrent, in partial remission: Secondary | ICD-10-CM | POA: Diagnosis not present

## 2020-06-22 DIAGNOSIS — N183 Chronic kidney disease, stage 3 unspecified: Secondary | ICD-10-CM | POA: Diagnosis not present

## 2020-06-26 ENCOUNTER — Inpatient Hospital Stay: Payer: Medicare Other | Attending: Hematology and Oncology

## 2020-06-26 ENCOUNTER — Inpatient Hospital Stay: Payer: Medicare Other

## 2020-06-26 ENCOUNTER — Other Ambulatory Visit: Payer: Self-pay

## 2020-06-26 DIAGNOSIS — D7589 Other specified diseases of blood and blood-forming organs: Secondary | ICD-10-CM | POA: Diagnosis not present

## 2020-06-26 LAB — CBC WITH DIFFERENTIAL/PLATELET
Abs Immature Granulocytes: 0.01 10*3/uL (ref 0.00–0.07)
Basophils Absolute: 0.1 10*3/uL (ref 0.0–0.1)
Basophils Relative: 1 %
Eosinophils Absolute: 0.1 10*3/uL (ref 0.0–0.5)
Eosinophils Relative: 2 %
HCT: 41.5 % (ref 36.0–46.0)
Hemoglobin: 14.6 g/dL (ref 12.0–15.0)
Immature Granulocytes: 0 %
Lymphocytes Relative: 36 %
Lymphs Abs: 1.5 10*3/uL (ref 0.7–4.0)
MCH: 34 pg (ref 26.0–34.0)
MCHC: 35.2 g/dL (ref 30.0–36.0)
MCV: 96.5 fL (ref 80.0–100.0)
Monocytes Absolute: 0.5 10*3/uL (ref 0.1–1.0)
Monocytes Relative: 13 %
Neutro Abs: 2 10*3/uL (ref 1.7–7.7)
Neutrophils Relative %: 48 %
Platelets: 149 10*3/uL — ABNORMAL LOW (ref 150–400)
RBC: 4.3 MIL/uL (ref 3.87–5.11)
RDW: 12.3 % (ref 11.5–15.5)
WBC: 4.2 10*3/uL (ref 4.0–10.5)
nRBC: 0 % (ref 0.0–0.2)

## 2020-06-26 NOTE — Patient Instructions (Signed)

## 2020-06-26 NOTE — Progress Notes (Signed)
Michelle [name] presents today for phlebotomy per MD orders.  Phlebotomy procedure started at 1433 using a 20g IV needle and ended at 1506.  511 grams removed.  Patient observed for 30 minutes after procedure without any incident.  IV needle removed intact.  Food and beverage provided.  Patient tolerated procedure well.

## 2020-06-26 NOTE — Progress Notes (Signed)
Upon departure, vital signs WDL, patient ambulatory and without complaints. Patient denied dizziness or nausea when leaving infusion clinic.

## 2020-07-06 DIAGNOSIS — L932 Other local lupus erythematosus: Secondary | ICD-10-CM | POA: Diagnosis not present

## 2020-07-20 DIAGNOSIS — M25561 Pain in right knee: Secondary | ICD-10-CM | POA: Diagnosis not present

## 2020-07-20 DIAGNOSIS — Z96651 Presence of right artificial knee joint: Secondary | ICD-10-CM | POA: Diagnosis not present

## 2020-07-24 ENCOUNTER — Inpatient Hospital Stay: Payer: Medicare Other | Attending: Hematology and Oncology

## 2020-07-24 ENCOUNTER — Other Ambulatory Visit: Payer: Self-pay

## 2020-07-24 ENCOUNTER — Inpatient Hospital Stay: Payer: Medicare Other

## 2020-07-24 DIAGNOSIS — D7589 Other specified diseases of blood and blood-forming organs: Secondary | ICD-10-CM

## 2020-07-24 LAB — CBC WITH DIFFERENTIAL/PLATELET
Abs Immature Granulocytes: 0.01 10*3/uL (ref 0.00–0.07)
Basophils Absolute: 0.1 10*3/uL (ref 0.0–0.1)
Basophils Relative: 2 %
Eosinophils Absolute: 0.1 10*3/uL (ref 0.0–0.5)
Eosinophils Relative: 3 %
HCT: 40.4 % (ref 36.0–46.0)
Hemoglobin: 13.8 g/dL (ref 12.0–15.0)
Immature Granulocytes: 0 %
Lymphocytes Relative: 41 %
Lymphs Abs: 1.7 10*3/uL (ref 0.7–4.0)
MCH: 34.1 pg — ABNORMAL HIGH (ref 26.0–34.0)
MCHC: 34.2 g/dL (ref 30.0–36.0)
MCV: 99.8 fL (ref 80.0–100.0)
Monocytes Absolute: 0.5 10*3/uL (ref 0.1–1.0)
Monocytes Relative: 12 %
Neutro Abs: 1.7 10*3/uL (ref 1.7–7.7)
Neutrophils Relative %: 42 %
Platelets: 172 10*3/uL (ref 150–400)
RBC: 4.05 MIL/uL (ref 3.87–5.11)
RDW: 12.8 % (ref 11.5–15.5)
WBC: 4 10*3/uL (ref 4.0–10.5)
nRBC: 0 % (ref 0.0–0.2)

## 2020-07-24 NOTE — Progress Notes (Signed)
Second phlebotomy attempted in L AC with 20 G IV needle and secondary tubing. Phlebotomy started at 1355 and ended at 1419 with 98 grams removed.  IV site was flushed with NS twice during procedure, but was unable to get sufficient blood return during last flush.  Sandi Mealy, PA notified. Per Sandi Mealy, ok to stop phlebotomy for today as a total of 188 grams removed today. IV needle catheter removed intact.  Patient to follow-up at next scheduled appointment. Educated patient of importance of hydration day prior to phlebotomy as well as the morning of phlebotomy. Patient verbalized understanding. Patient monitored for 30 minutes post phlebotomy. VSS upon leaving infusion room.

## 2020-07-24 NOTE — Progress Notes (Signed)
Patient presented today for phlebotomy per MD order. A 20 gauge was placed in the Right AC and 90 mls was removed. Vein clotted and the catheter was removed fully intact.

## 2020-07-24 NOTE — Patient Instructions (Signed)

## 2020-08-24 DIAGNOSIS — M19011 Primary osteoarthritis, right shoulder: Secondary | ICD-10-CM | POA: Diagnosis not present

## 2020-08-30 ENCOUNTER — Inpatient Hospital Stay: Payer: Medicare Other | Attending: Hematology and Oncology

## 2020-08-30 ENCOUNTER — Encounter: Payer: Self-pay | Admitting: Hematology and Oncology

## 2020-08-30 ENCOUNTER — Other Ambulatory Visit: Payer: Self-pay

## 2020-08-30 ENCOUNTER — Inpatient Hospital Stay: Payer: Medicare Other

## 2020-08-30 ENCOUNTER — Inpatient Hospital Stay: Payer: Medicare Other | Admitting: Hematology and Oncology

## 2020-08-30 DIAGNOSIS — Z79899 Other long term (current) drug therapy: Secondary | ICD-10-CM | POA: Insufficient documentation

## 2020-08-30 DIAGNOSIS — I10 Essential (primary) hypertension: Secondary | ICD-10-CM | POA: Diagnosis not present

## 2020-08-30 DIAGNOSIS — E785 Hyperlipidemia, unspecified: Secondary | ICD-10-CM | POA: Insufficient documentation

## 2020-08-30 DIAGNOSIS — Z87891 Personal history of nicotine dependence: Secondary | ICD-10-CM | POA: Diagnosis not present

## 2020-08-30 DIAGNOSIS — Z90721 Acquired absence of ovaries, unilateral: Secondary | ICD-10-CM | POA: Diagnosis not present

## 2020-08-30 DIAGNOSIS — D7589 Other specified diseases of blood and blood-forming organs: Secondary | ICD-10-CM

## 2020-08-30 LAB — CBC WITH DIFFERENTIAL/PLATELET
Abs Immature Granulocytes: 0.02 10*3/uL (ref 0.00–0.07)
Basophils Absolute: 0 10*3/uL (ref 0.0–0.1)
Basophils Relative: 1 %
Eosinophils Absolute: 0.1 10*3/uL (ref 0.0–0.5)
Eosinophils Relative: 3 %
HCT: 41.7 % (ref 36.0–46.0)
Hemoglobin: 14.5 g/dL (ref 12.0–15.0)
Immature Granulocytes: 0 %
Lymphocytes Relative: 34 %
Lymphs Abs: 1.5 10*3/uL (ref 0.7–4.0)
MCH: 34.5 pg — ABNORMAL HIGH (ref 26.0–34.0)
MCHC: 34.8 g/dL (ref 30.0–36.0)
MCV: 99.3 fL (ref 80.0–100.0)
Monocytes Absolute: 0.5 10*3/uL (ref 0.1–1.0)
Monocytes Relative: 12 %
Neutro Abs: 2.3 10*3/uL (ref 1.7–7.7)
Neutrophils Relative %: 50 %
Platelets: 152 10*3/uL (ref 150–400)
RBC: 4.2 MIL/uL (ref 3.87–5.11)
RDW: 12 % (ref 11.5–15.5)
WBC: 4.5 10*3/uL (ref 4.0–10.5)
nRBC: 0 % (ref 0.0–0.2)

## 2020-08-30 NOTE — Progress Notes (Signed)
Patient here today for therapeutic phlebotomy per Drs orders. 18 Gauge IV was used in the left forearm. Start time was 1455 and ended at 1520 with 507g of blood removed. Snacks and a drink was given. VSS upon leaving

## 2020-08-30 NOTE — Progress Notes (Signed)
[place] NOTE  Patient Care Team: Jonathon Jordan, MD as PCP - General (Family Medicine) End, Harrell Gave, MD as PCP - Cardiology (Cardiology) Gaynelle Arabian, MD (Orthopedic Surgery)  CHIEF COMPLAINTS/PURPOSE OF CONSULTATION:  Follow up for hemochromatosis and phlebotomy needs.  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 She is undergoing monthly phlebotomy, last one was unsuccessful because of difficult access. She is very worried about not being able to get it on time. Ok to proceed with phlebotomy today. Will repeat CMP, CBC and ferritin when she comes for next FU. Encouraged avoiding or minimizing alcohol, avoidance of iron supplements, minimal red meat intake, tylenol and cooking in cast iron pans.  Morbid obesity ([place]) Weight gain since last visit. Again encouraged portion control, hydration, regular exercise. She will continue to work on this and monitor.  Macrocytosis without anemia This has improved since she cut down her alcohol intake. Will continue to monitor.  Orders Placed This Encounter  Procedures  . CMP ([place] only)    Standing Status:   Future    Standing Expiration Date:   08/30/2021  . Ferritin    Standing Status:   Future    Standing Expiration Date:   08/30/2021  . CBC with Differential/Platelet    Standing Status:   Standing    Number of Occurrences:   22    Standing Expiration Date:   08/30/2021      HISTORY OF PRESENTING ILLNESS:   Michelle [name] 75 y.o. female is here because of macrocytosis.  This is a very pleasant 75 year old female patient with past medical history significant for hypertension, dyslipidemia, chronic lower back pain referred to hematology for evaluation of macrocytosis and hemochromatosis.  Interim History She is here for a FU. She is very anxious about not being able to finish her phlebotomy  last time She is also worried about the skin rash, dermatology did biopsies of the skin and this showed morphea according to patient. She doesn't have itching or other issues with skin rash. She takes zoloft for anxiety but she feels like she needs something more. She denies any changes in breathing, bowel habits or urinary habits. She is upset that she hasnt lost much weight. Rest of the pertinent 10 point ROS as mentioned below and negative.  MEDICAL HISTORY:  Past Medical History:  Diagnosis Date  . Acute hypoxemic respiratory failure ([place]) 04/24/2017  . Anxiety   . Arthritis    "back" (04/24/2017)  . 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 Chondromalacia of knee    right knee  . Chronic bronchitis ([place])   . Chronic lower back pain   . DDD (degenerative disc disease) 07-24-11   Osteoarthritis-s/p LTKA 2'11, now right planned  . Depression 07-24-11   tx. depression only  . Fatty liver   . History of diverticulitis 02/2018  . Hx of adenomatous polyp of colon 03/21/2014  . Hyperlipidemia   . Hypertension   . IBS (irritable bowel syndrome)   . OSA on CPAP 12/07/2016  . Pneumonia 1990s X 1  . SOB (shortness of breath)   . Vitamin D deficiency     SURGICAL HISTORY: Past Surgical History:  Procedure Laterality Date  . APPENDECTOMY  1967  . BOTOX INJECTION     "face"  . CATARACT EXTRACTION W/ INTRAOCULAR LENS IMPLANT Bilateral 2000s  . CESAREAN  SECTION  1972; 1975  . COLONOSCOPY W/ BIOPSIES AND POLYPECTOMY  "a few"  . DILATION AND CURETTAGE OF UTERUS    . HYSTEROSCOPY WITH D & C  08/29/2006   and resection of endometrial polyp/notes 09/10/2010  . JOINT REPLACEMENT    . KNEE ARTHROSCOPY Bilateral   . NASAL SEPTUM SURGERY  1972  . OOPHORECTOMY Right 1994  . TOTAL KNEE ARTHROPLASTY  08/05/2011   Procedure: TOTAL KNEE ARTHROPLASTY;  Surgeon: Gearlean Alf, MD;  Location: [facility];  Service: Orthopedics;  Laterality: Right;  . TOTAL KNEE  ARTHROPLASTY Left 05/2009  . TUBAL LIGATION  1975    SOCIAL HISTORY: Social History   Socioeconomic History  . Marital status: Divorced    Spouse name: Not on file  . Number of children: 2  . Years of education: Not on file  . Highest education level: Not on file  Occupational History  . Occupation: Retired Secretary/administrator  Tobacco Use  . Smoking status: Former Smoker    Packs/day: 0.12    Years: 36.00    Pack years: 4.32    Types: Cigarettes    Quit date: 06/19/2014    Years since quitting: 6.2  . Smokeless tobacco: Never Used  Vaping Use  . Vaping Use: Never used  Substance and Sexual Activity  . Alcohol use: Yes    Alcohol/week: 21.0 standard drinks    Types: 21 Glasses of wine per week    Comment: 04/24/2017 "3, glasses of white wine qd"  . Drug use: No  . Sexual activity: Never  Other Topics Concern  . Not on file  Social History Narrative  . Not on file   Social Determinants of Health   Financial Resource Strain: Not on file  Food Insecurity: Not on file  Transportation Needs: Not on file  Physical Activity: Not on file  Stress: Not on file  Social Connections: Not on file  Intimate Partner Violence: Not on file    FAMILY HISTORY: Family History  Problem Relation Age of Onset  . Heart disease Mother        Rheumatic heart disease  . Pulmonary embolism Mother   . Heart disease Father        valvular heart disease  . Stroke Father   . Hypertension Sister   . Colon cancer Neg Hx   . Esophageal cancer Neg Hx   . Rectal cancer Neg Hx   . Stomach cancer Neg Hx   . Pancreatic cancer Neg Hx     ALLERGIES:  has No Known Allergies.  MEDICATIONS:  Current Outpatient Medications  Medication Sig Dispense Refill  . cholecalciferol (VITAMIN D3) 25 MCG (1000 UT) tablet Take 1,000 Units by mouth daily.    Marland Kitchen losartan (COZAAR) 50 MG tablet Take 50 mg by mouth daily.    Marland Kitchen pyridOXINE (VITAMIN B-6) 100 MG tablet Take 100 mg by mouth daily.    . sertraline (ZOLOFT)  100 MG tablet Take 100 mg by mouth 2 (two) times a day.   0  . vitamin B-12 (CYANOCOBALAMIN) 100 MCG tablet Take 100 mcg by mouth daily.    . Zinc 25 MG TABS Take 1 tablet by mouth daily.     No current facility-administered medications for this visit.   PHYSICAL EXAMINATION:  ECOG PERFORMANCE STATUS: 0 - Asymptomatic  Vitals:   08/30/20 1339  BP: (!) 147/93  Pulse: 69  Resp: 18  Temp: 97.9 F (36.6 C)  SpO2: 99%   Filed Weights  08/30/20 1339  Weight: 236 lb (107 kg)   Physical Exam Constitutional:      Appearance: She is normal weight.  HENT:     Head: Normocephalic and atraumatic.  Cardiovascular:     Rate and Rhythm: Normal rate and regular rhythm.     Pulses: Normal pulses.     Heart sounds: Normal heart sounds.  Pulmonary:     Effort: Pulmonary effort is normal.     Breath sounds: Normal breath sounds.  Abdominal:     General: Abdomen is flat.     Palpations: Abdomen is soft.  Musculoskeletal:        General: Normal range of motion.     Cervical back: Normal range of motion. No rigidity.  Lymphadenopathy:     Cervical: No cervical adenopathy.  Skin:    General: Skin is warm and dry.     Findings: Rash present.  Neurological:     General: No focal deficit present.     Mental Status: She is alert.      LABORATORY DATA:  I have reviewed the data as listed Lab Results  Component Value Date   WBC 4.5 08/30/2020   HGB 14.5 08/30/2020   HCT 41.7 08/30/2020   MCV 99.3 08/30/2020   PLT 152 08/30/2020     Chemistry      Component Value Date/Time   NA 140 05/23/2020 1413   NA 139 02/18/2018 1217   K 4.8 05/23/2020 1413   CL 104 05/23/2020 1413   CO2 27 05/23/2020 1413   BUN 23 05/23/2020 1413   BUN 30 (H) 02/18/2018 1217   CREATININE 1.03 (H) 05/23/2020 1413      Component Value Date/Time   CALCIUM 9.3 05/23/2020 1413   ALKPHOS 84 05/23/2020 1413   AST 30 05/23/2020 1413   ALT 46 (H) 05/23/2020 1413   BILITOT 0.7 05/23/2020 1413     CBC  reviewed. Ok to proceed with phlebotomy  RADIOGRAPHIC STUDIES: I have personally reviewed the radiological images as listed and agreed with the findings in the report. No results found.  All questions were answered. The patient knows to call the clinic with any problems, questions or concerns.      Benay Pike, MD 08/30/2020 3:30 PM

## 2020-08-30 NOTE — Assessment & Plan Note (Signed)
Weight gain since last visit. Again encouraged portion control, hydration, regular exercise. She will continue to work on this and monitor.

## 2020-08-30 NOTE — Assessment & Plan Note (Signed)
Heterozygous for C282 Y with ferritin of 1100 and some transaminitis at diagnosis. She is undergoing monthly phlebotomy, last one was unsuccessful because of difficult access. She is very worried about not being able to get it on time. Ok to proceed with phlebotomy today. Will repeat CMP, CBC and ferritin when she comes for next FU. Encouraged avoiding or minimizing alcohol, avoidance of iron supplements, minimal red meat intake, tylenol and cooking in cast iron pans.

## 2020-08-30 NOTE — Assessment & Plan Note (Signed)
This has improved since she cut down her alcohol intake. Will continue to monitor.

## 2020-09-14 DIAGNOSIS — M19011 Primary osteoarthritis, right shoulder: Secondary | ICD-10-CM | POA: Diagnosis not present

## 2020-09-20 ENCOUNTER — Ambulatory Visit: Payer: Medicare Other | Admitting: Hematology and Oncology

## 2020-10-02 ENCOUNTER — Other Ambulatory Visit: Payer: Medicare Other

## 2020-10-03 ENCOUNTER — Ambulatory Visit: Payer: Medicare Other | Admitting: Hematology and Oncology

## 2020-10-03 ENCOUNTER — Inpatient Hospital Stay: Payer: Medicare Other | Admitting: Hematology and Oncology

## 2020-10-03 ENCOUNTER — Other Ambulatory Visit: Payer: Self-pay

## 2020-10-03 ENCOUNTER — Other Ambulatory Visit: Payer: Medicare Other

## 2020-10-03 ENCOUNTER — Encounter: Payer: Self-pay | Admitting: Hematology and Oncology

## 2020-10-03 ENCOUNTER — Inpatient Hospital Stay: Payer: Medicare Other | Attending: Hematology and Oncology

## 2020-10-03 ENCOUNTER — Inpatient Hospital Stay: Payer: Medicare Other

## 2020-10-03 DIAGNOSIS — I10 Essential (primary) hypertension: Secondary | ICD-10-CM | POA: Insufficient documentation

## 2020-10-03 DIAGNOSIS — D7589 Other specified diseases of blood and blood-forming organs: Secondary | ICD-10-CM

## 2020-10-03 DIAGNOSIS — Z90721 Acquired absence of ovaries, unilateral: Secondary | ICD-10-CM | POA: Insufficient documentation

## 2020-10-03 DIAGNOSIS — E785 Hyperlipidemia, unspecified: Secondary | ICD-10-CM | POA: Diagnosis not present

## 2020-10-03 DIAGNOSIS — G4733 Obstructive sleep apnea (adult) (pediatric): Secondary | ICD-10-CM | POA: Insufficient documentation

## 2020-10-03 DIAGNOSIS — Z79899 Other long term (current) drug therapy: Secondary | ICD-10-CM | POA: Insufficient documentation

## 2020-10-03 LAB — CMP (CANCER CENTER ONLY)
ALT: 49 U/L — ABNORMAL HIGH (ref 0–44)
AST: 28 U/L (ref 15–41)
Albumin: 3.7 g/dL (ref 3.5–5.0)
Alkaline Phosphatase: 71 U/L (ref 38–126)
Anion gap: 9 (ref 5–15)
BUN: 21 mg/dL (ref 8–23)
CO2: 27 mmol/L (ref 22–32)
Calcium: 9.2 mg/dL (ref 8.9–10.3)
Chloride: 104 mmol/L (ref 98–111)
Creatinine: 0.98 mg/dL (ref 0.44–1.00)
GFR, Estimated: >60 mL/min (ref >60)
Glucose, Bld: 153 mg/dL — ABNORMAL HIGH (ref 70–99)
Potassium: 4.8 mmol/L (ref 3.5–5.1)
Sodium: 140 mmol/L (ref 135–145)
Total Bilirubin: 0.5 mg/dL (ref 0.3–1.2)
Total Protein: 7.1 g/dL (ref 6.5–8.1)

## 2020-10-03 LAB — CBC WITH DIFFERENTIAL/PLATELET
Abs Immature Granulocytes: 0 10*3/uL (ref 0.00–0.07)
Basophils Absolute: 0.1 10*3/uL (ref 0.0–0.1)
Basophils Relative: 1 %
Eosinophils Absolute: 0.2 10*3/uL (ref 0.0–0.5)
Eosinophils Relative: 4 %
HCT: 40.6 % (ref 36.0–46.0)
Hemoglobin: 14 g/dL (ref 12.0–15.0)
Immature Granulocytes: 0 %
Lymphocytes Relative: 33 %
Lymphs Abs: 1.6 10*3/uL (ref 0.7–4.0)
MCH: 34.7 pg — ABNORMAL HIGH (ref 26.0–34.0)
MCHC: 34.5 g/dL (ref 30.0–36.0)
MCV: 100.5 fL — ABNORMAL HIGH (ref 80.0–100.0)
Monocytes Absolute: 0.6 10*3/uL (ref 0.1–1.0)
Monocytes Relative: 13 %
Neutro Abs: 2.4 10*3/uL (ref 1.7–7.7)
Neutrophils Relative %: 49 %
Platelets: 156 10*3/uL (ref 150–400)
RBC: 4.04 MIL/uL (ref 3.87–5.11)
RDW: 12.1 % (ref 11.5–15.5)
WBC: 4.9 10*3/uL (ref 4.0–10.5)
nRBC: 0 % (ref 0.0–0.2)

## 2020-10-03 LAB — FERRITIN: Ferritin: 564 ng/mL — ABNORMAL HIGH (ref 11–307)

## 2020-10-03 NOTE — Assessment & Plan Note (Signed)
This is likely from ongoing wine consumption.  This is mild, she is already on B12 supplementation.  We will continue to monitor.

## 2020-10-03 NOTE — Progress Notes (Signed)
[place] NOTE  Patient Care Team: Jonathon Jordan, MD as PCP - General (Family Medicine) End, Harrell Gave, MD as PCP - Cardiology (Cardiology) Gaynelle Arabian, MD (Orthopedic Surgery)  CHIEF COMPLAINTS/PURPOSE OF CONSULTATION:  Follow up for hemochromatosis and phlebotomy needs.  ASSESSMENT & PLAN:   Hereditary hemochromatosis ([facility]le [name]) This is a very pleasant 75 year old with heterozygous C48 Y, baseline ferritin of 1100 and some transaminitis at diagnosis referred to hematology for further recommendations. She is undergoing monthly phlebotomy, tolerating it very well.  Labs from today are pending.  CBC shows ongoing macrocytosis, mild and CMP shows mild transaminitis which is chronic. We will change the frequency of phlebotomy based on the ferritin level from today. She will proceed with phlebotomy as planned today.  Macrocytosis without anemia This is likely from ongoing wine consumption.  This is mild, she is already on B12 supplementation.  We will continue to monitor.  No orders of the defined types were placed in this encounter.    Age-appropriate cancer screening advised HISTORY OF PRESENTING ILLNESS:   Michelle [name] 75 y.o. female is here because of macrocytosis.  This is a very pleasant 75 year old female patient with past medical history significant for hypertension, dyslipidemia, chronic lower back pain referred to hematology for evaluation of macrocytosis and hemochromatosis.  Interim History  She is here for a FU.  Since last visit, she had a phlebotomy and felt well after the phlebotomy.  No adverse effects from phlebotomy.  She is also scheduled for another session of phlebotomy today.  She was hoping if we can do the phlebotomy more often to bring down the ferritins.  She continues to deal with the macular rash on the trunk and the flexural surfaces and has a follow-up with rheumatologist.  She has previously seen a dermatologist and had a  biopsy. No change in bowel habits, urinary habits or new neurological issues.  Rest of the pertinent 10 point ROS reviewed and negative.  MEDICAL HISTORY:  Past Medical History:  Diagnosis Date   Acute hypoxemic respiratory failure ([place]) 04/24/2017   Anxiety    Arthritis    "back" (04/24/2017)   Blurry vision, left eye    "YAG on the left; to be repaired" (04/24/2017)   Chondromalacia of knee    right knee   Chronic bronchitis ([facility])    Chronic lower back pain    DDD (degenerative disc disease) 07-24-11   Osteoarthritis-s/p LTKA 2'11, now right planned   Depression 07-24-11   tx. depression only   Fatty liver    History of diverticulitis 02/2018   Hx of adenomatous polyp of colon 03/21/2014   Hyperlipidemia    Hypertension    IBS (irritable bowel syndrome)    OSA on CPAP 12/07/2016   Pneumonia 1990s X 1   SOB (shortness of breath)    Vitamin D deficiency     SURGICAL HISTORY: Past Surgical History:  Procedure Laterality Date   APPENDECTOMY  1967   BOTOX INJECTION     "face"   CATARACT EXTRACTION W/ INTRAOCULAR LENS IMPLANT Bilateral 2000s   CESAREAN SECTION  1972; 1975   COLONOSCOPY W/ BIOPSIES AND POLYPECTOMY  "a few"   DILATION AND CURETTAGE OF UTERUS     HYSTEROSCOPY WITH D & C  08/29/2006   and resection of endometrial polyp/notes 09/10/2010   JOINT REPLACEMENT     KNEE ARTHROSCOPY Bilateral    NASAL SEPTUM SURGERY  1972   OOPHORECTOMY Right 1994   TOTAL KNEE ARTHROPLASTY  08/05/2011   Procedure: TOTAL KNEE ARTHROPLASTY;  Surgeon: Gearlean Alf, MD;  Location: [facility];  Service: Orthopedics;  Laterality: Right;   TOTAL KNEE ARTHROPLASTY Left 05/2009   TUBAL LIGATION  1975    SOCIAL HISTORY: Social History   Socioeconomic History   Marital status: Divorced    Spouse name: Not on file   Number of children: 2   Years of education: Not on file   Highest education level: Not on file  Occupational History   Occupation: Retired Secretary/administrator  Tobacco Use   Smoking  status: Former    Packs/day: 0.12    Years: 36.00    Pack years: 4.32    Types: Cigarettes    Quit date: 06/19/2014    Years since quitting: 6.2   Smokeless tobacco: Never  Vaping Use   Vaping Use: Never used  Substance and Sexual Activity   Alcohol use: Yes    Alcohol/week: 21.0 standard drinks    Types: 21 Glasses of wine per week    Comment: 04/24/2017 "3, glasses of white wine qd"   Drug use: No   Sexual activity: Never  Other Topics Concern   Not on file  Social History Narrative   Not on file   Social Determinants of Health   Financial Resource Strain: Not on file  Food Insecurity: Not on file  Transportation Needs: Not on file  Physical Activity: Not on file  Stress: Not on file  Social Connections: Not on file  Intimate Partner Violence: Not on file    FAMILY HISTORY: Family History  Problem Relation Age of Onset   Heart disease Mother        Rheumatic heart disease   Pulmonary embolism Mother    Heart disease Father        valvular heart disease   Stroke Father    Hypertension Sister    Colon cancer Neg Hx    Esophageal cancer Neg Hx    Rectal cancer Neg Hx    Stomach cancer Neg Hx    Pancreatic cancer Neg Hx     ALLERGIES:  has No Known Allergies.  MEDICATIONS:  Current Outpatient Medications  Medication Sig Dispense Refill   cholecalciferol (VITAMIN D3) 25 MCG (1000 UT) tablet Take 1,000 Units by mouth daily.     losartan (COZAAR) 50 MG tablet Take 50 mg by mouth daily.     pyridOXINE (VITAMIN B-6) 100 MG tablet Take 100 mg by mouth daily.     sertraline (ZOLOFT) 100 MG tablet Take 100 mg by mouth 2 (two) times a day.   0   vitamin B-12 (CYANOCOBALAMIN) 100 MCG tablet Take 100 mcg by mouth daily.     Zinc 25 MG TABS Take 1 tablet by mouth daily.     No current facility-administered medications for this visit.   PHYSICAL EXAMINATION:  ECOG PERFORMANCE STATUS: 0 - Asymptomatic  Vitals:   10/03/20 1339  BP: (!) 148/81  Pulse: 73  Resp: 19   Temp: 97.6 F (36.4 C)  SpO2: 95%   Filed Weights   10/03/20 1339  Weight: 233 lb 11.2 oz (106 kg)   Physical Exam Constitutional:      Appearance: She is normal weight.  HENT:     Head: Normocephalic and atraumatic.  Cardiovascular:     Rate and Rhythm: Normal rate and regular rhythm.     Pulses: Normal pulses.     Heart sounds: Normal heart sounds.  Pulmonary:  Effort: Pulmonary effort is normal.     Breath sounds: Normal breath sounds.  Abdominal:     General: Abdomen is flat.     Palpations: Abdomen is soft.  Musculoskeletal:        General: Normal range of motion.     Cervical back: Normal range of motion. No rigidity.  Lymphadenopathy:     Cervical: No cervical adenopathy.  Skin:    General: Skin is warm and dry.     Findings: Rash (Rash is macular and predominantly seen on the back and the flexural surfaces of the extremities near the elbow and the wrist on the upper extremities.  No palpable rash.) present.  Neurological:     General: No focal deficit present.     Mental Status: She is alert.     LABORATORY DATA:  I have reviewed the data as listed Lab Results  Component Value Date   WBC 4.9 10/03/2020   HGB 14.0 10/03/2020   HCT 40.6 10/03/2020   MCV 100.5 (H) 10/03/2020   PLT 156 10/03/2020     Chemistry      Component Value Date/Time   NA 140 10/03/2020 1301   NA 139 02/18/2018 1217   K 4.8 10/03/2020 1301   CL 104 10/03/2020 1301   CO2 27 10/03/2020 1301   BUN 21 10/03/2020 1301   BUN 30 (H) 02/18/2018 1217   CREATININE 0.98 10/03/2020 1301      Component Value Date/Time   CALCIUM 9.2 10/03/2020 1301   ALKPHOS 71 10/03/2020 1301   AST 28 10/03/2020 1301   ALT 49 (H) 10/03/2020 1301   BILITOT 0.5 10/03/2020 1301      RADIOGRAPHIC STUDIES: I have personally reviewed the radiological images as listed and agreed with the findings in the report. No results found.  All questions were answered. The patient knows to call the clinic with  any problems, questions or concerns.  I spent 20 minutes in the care of this patient including history, review of records, counseling and coordination of care.  We have discussed about frequency of phlebotomy, adverse effects of iron overload, skin rash and labs.  Benay Pike, MD 10/03/2020 2:06 PM

## 2020-10-03 NOTE — Patient Instructions (Signed)
Therapeutic Phlebotomy Therapeutic phlebotomy is the planned removal of blood from a person's body for the purpose of treating a medical condition. The procedure is similar to donating blood. Usually, about a pint (470 mL, or 0.47 L) of blood is removed.The average adult has 9-12 pints (4.3-5.7 L) of blood in the body. Therapeutic phlebotomy may be used to treat the following medical conditions: Hemochromatosis. This is a condition in which the blood contains too much iron. Polycythemia vera. This is a condition in which the blood contains too many red blood cells. Porphyria cutanea tarda. This is a disease in which an important part of hemoglobin is not made properly. It results in the buildup of abnormal amounts of porphyrins in the body. Sickle cell disease. This is a condition in which the red blood cells form an abnormal crescent shape rather than a round shape. Tell a health care provider about: Any allergies you have. All medicines you are taking, including vitamins, herbs, eye drops, creams, and over-the-counter medicines. Any problems you or family members have had with anesthetic medicines. Any blood disorders you have. Any surgeries you have had. Any medical conditions you have. Whether you are pregnant or may be pregnant. What are the risks? Generally, this is a safe procedure. However, problems may occur, including: Nausea or light-headedness. Low blood pressure (hypotension). Soreness, bleeding, swelling, or bruising at the needle insertion site. Infection. What happens before the procedure? Follow instructions from your health care provider about eating or drinking restrictions. Ask your health care provider about: Changing or stopping your regular medicines. This is especially important if you are taking diabetes medicines or blood thinners (anticoagulants). Taking medicines such as aspirin and ibuprofen. These medicines can thin your blood. Do not take these medicines unless  your health care provider tells you to take them. Taking over-the-counter medicines, vitamins, herbs, and supplements. Wear clothing with sleeves that can be raised above the elbow. Plan to have someone take you home from the hospital or clinic. You may have a blood sample taken. Your blood pressure, pulse rate, and breathing rate will be measured. What happens during the procedure?  To lower your risk of infection: Your health care team will wash or sanitize their hands. Your skin will be cleaned with an antiseptic. You may be given a medicine to numb the area (local anesthetic). A tourniquet will be placed on your arm. A needle will be inserted into one of your veins. Tubing and a collection bag will be attached to that needle. Blood will flow through the needle and tubing into the collection bag. The collection bag will be placed lower than your arm to allow gravity to help the flow of blood into the bag. You may be asked to open and close your hand slowly and continually during the entire collection. After the specified amount of blood has been removed from your body, the collection bag and tubing will be clamped. The needle will be removed from your vein. Pressure will be held on the site of the needle insertion to stop the bleeding. A bandage (dressing) will be placed over the needle insertion site. The procedure may vary among health care providers and hospitals. What happens after the procedure? Your blood pressure, pulse rate, and breathing rate will be measured after the procedure. You will be encouraged to drink fluids. Your recovery will be assessed and monitored. You can return to your normal activities as told by your health care provider. Summary Therapeutic phlebotomy is the planned removal of   blood from a person's body for the purpose of treating a medical condition. Therapeutic phlebotomy may be used to treat hemochromatosis, polycythemia vera, porphyria cutanea tarda,  or sickle cell disease. In the procedure, a needle is inserted and about a pint (470 mL, or 0.47 L) of blood is removed. The average adult has 9-12 pints (4.3-5.7 L) of blood in the body. This is generally a safe procedure, but it can sometimes cause problems such as nausea, light-headedness, or low blood pressure (hypotension). This information is not intended to replace advice given to you by your health care provider. Make sure you discuss any questions you have with your healthcare provider. Document Revised: 04/24/2017 Document Reviewed: 04/24/2017 Elsevier Patient Education  2022 [employer]  

## 2020-10-03 NOTE — Assessment & Plan Note (Signed)
This is a very pleasant 75 year old with heterozygous C69 Y, baseline ferritin of 1100 and some transaminitis at diagnosis referred to hematology for further recommendations. She is undergoing monthly phlebotomy, tolerating it very well.  Labs from today are pending.  CBC shows ongoing macrocytosis, mild and CMP shows mild transaminitis which is chronic. We will change the frequency of phlebotomy based on the ferritin level from today. She will proceed with phlebotomy as planned today.

## 2020-10-05 DIAGNOSIS — L930 Discoid lupus erythematosus: Secondary | ICD-10-CM | POA: Diagnosis not present

## 2020-10-05 DIAGNOSIS — M159 Polyosteoarthritis, unspecified: Secondary | ICD-10-CM | POA: Diagnosis not present

## 2020-10-05 DIAGNOSIS — L719 Rosacea, unspecified: Secondary | ICD-10-CM | POA: Diagnosis not present

## 2020-10-11 ENCOUNTER — Telehealth: Payer: Self-pay | Admitting: Hematology and Oncology

## 2020-10-11 NOTE — Telephone Encounter (Signed)
Scheduled appts per 6/20 sch msg. Called pt, no answer. Left msg with appts dates and times.

## 2020-10-20 ENCOUNTER — Telehealth: Payer: Self-pay | Admitting: Hematology and Oncology

## 2020-10-20 NOTE — Telephone Encounter (Signed)
R/s appts per 7/1 sch msg. Pt aware.  

## 2020-10-27 ENCOUNTER — Other Ambulatory Visit: Payer: Medicare Other

## 2020-11-01 ENCOUNTER — Other Ambulatory Visit: Payer: Self-pay

## 2020-11-01 ENCOUNTER — Inpatient Hospital Stay: Payer: Medicare Other

## 2020-11-01 ENCOUNTER — Inpatient Hospital Stay: Payer: Medicare Other | Attending: Hematology and Oncology

## 2020-11-01 DIAGNOSIS — D7589 Other specified diseases of blood and blood-forming organs: Secondary | ICD-10-CM

## 2020-11-01 LAB — CBC WITH DIFFERENTIAL/PLATELET
Abs Immature Granulocytes: 0.01 10*3/uL (ref 0.00–0.07)
Basophils Absolute: 0.1 10*3/uL (ref 0.0–0.1)
Basophils Relative: 1 %
Eosinophils Absolute: 0.3 10*3/uL (ref 0.0–0.5)
Eosinophils Relative: 6 %
HCT: 40.6 % (ref 36.0–46.0)
Hemoglobin: 13.9 g/dL (ref 12.0–15.0)
Immature Granulocytes: 0 %
Lymphocytes Relative: 28 %
Lymphs Abs: 1.4 10*3/uL (ref 0.7–4.0)
MCH: 35.1 pg — ABNORMAL HIGH (ref 26.0–34.0)
MCHC: 34.2 g/dL (ref 30.0–36.0)
MCV: 102.5 fL — ABNORMAL HIGH (ref 80.0–100.0)
Monocytes Absolute: 0.6 10*3/uL (ref 0.1–1.0)
Monocytes Relative: 13 %
Neutro Abs: 2.5 10*3/uL (ref 1.7–7.7)
Neutrophils Relative %: 52 %
Platelets: 169 10*3/uL (ref 150–400)
RBC: 3.96 MIL/uL (ref 3.87–5.11)
RDW: 12.7 % (ref 11.5–15.5)
WBC: 4.8 10*3/uL (ref 4.0–10.5)
nRBC: 0 % (ref 0.0–0.2)

## 2020-11-01 NOTE — Progress Notes (Signed)
Michelle [name] presents today for phlebotomy per MD orders. Phlebotomy procedure started at 1500 and . 50 grams removed iv clotted off. W/ 18 G set. Dr. Chryl Heck requested to try a second time. This attempt was unsuccessful. IV needle removed intact. VSS stable at discharge.

## 2020-11-06 DIAGNOSIS — L719 Rosacea, unspecified: Secondary | ICD-10-CM | POA: Diagnosis not present

## 2020-11-06 DIAGNOSIS — L930 Discoid lupus erythematosus: Secondary | ICD-10-CM | POA: Diagnosis not present

## 2020-11-06 DIAGNOSIS — M159 Polyosteoarthritis, unspecified: Secondary | ICD-10-CM | POA: Diagnosis not present

## 2020-11-09 DIAGNOSIS — D225 Melanocytic nevi of trunk: Secondary | ICD-10-CM | POA: Diagnosis not present

## 2020-11-09 DIAGNOSIS — L298 Other pruritus: Secondary | ICD-10-CM | POA: Diagnosis not present

## 2020-11-09 DIAGNOSIS — L309 Dermatitis, unspecified: Secondary | ICD-10-CM | POA: Diagnosis not present

## 2020-11-13 ENCOUNTER — Other Ambulatory Visit: Payer: Medicare Other

## 2020-11-27 ENCOUNTER — Inpatient Hospital Stay: Payer: Medicare Other

## 2020-11-27 ENCOUNTER — Other Ambulatory Visit: Payer: Self-pay

## 2020-11-27 ENCOUNTER — Inpatient Hospital Stay: Payer: Medicare Other | Attending: Hematology and Oncology

## 2020-11-27 LAB — CBC WITH DIFFERENTIAL/PLATELET
Abs Immature Granulocytes: 0.01 10*3/uL (ref 0.00–0.07)
Basophils Absolute: 0.1 10*3/uL (ref 0.0–0.1)
Basophils Relative: 1 %
Eosinophils Absolute: 0.2 10*3/uL (ref 0.0–0.5)
Eosinophils Relative: 3 %
HCT: 40.9 % (ref 36.0–46.0)
Hemoglobin: 14.1 g/dL (ref 12.0–15.0)
Immature Granulocytes: 0 %
Lymphocytes Relative: 30 %
Lymphs Abs: 1.7 10*3/uL (ref 0.7–4.0)
MCH: 35.4 pg — ABNORMAL HIGH (ref 26.0–34.0)
MCHC: 34.5 g/dL (ref 30.0–36.0)
MCV: 102.8 fL — ABNORMAL HIGH (ref 80.0–100.0)
Monocytes Absolute: 0.8 10*3/uL (ref 0.1–1.0)
Monocytes Relative: 14 %
Neutro Abs: 2.9 10*3/uL (ref 1.7–7.7)
Neutrophils Relative %: 52 %
Platelets: 178 10*3/uL (ref 150–400)
RBC: 3.98 MIL/uL (ref 3.87–5.11)
RDW: 12.6 % (ref 11.5–15.5)
WBC: 5.6 10*3/uL (ref 4.0–10.5)
nRBC: 0 % (ref 0.0–0.2)

## 2020-11-27 NOTE — Progress Notes (Signed)
Patient here today for therapeutic phlebotomy per Drs orders. 20 Gauge IV was used in the right forearm. Pt was extremely anxious. 518g of blood removed. Snacks and a drink was given. VSS upon leaving.

## 2020-11-30 ENCOUNTER — Other Ambulatory Visit: Payer: Self-pay | Admitting: Hematology and Oncology

## 2020-11-30 NOTE — Progress Notes (Signed)
Port order placed per patient request.  Michelle [name]

## 2020-12-12 ENCOUNTER — Ambulatory Visit (HOSPITAL_COMMUNITY): Payer: Medicare Other

## 2020-12-12 ENCOUNTER — Other Ambulatory Visit (HOSPITAL_COMMUNITY): Payer: Medicare Other

## 2020-12-18 ENCOUNTER — Other Ambulatory Visit: Payer: Medicare Other

## 2020-12-26 ENCOUNTER — Other Ambulatory Visit: Payer: Medicare Other

## 2020-12-26 ENCOUNTER — Ambulatory Visit: Payer: Medicare Other | Admitting: Hematology and Oncology

## 2020-12-28 DIAGNOSIS — R21 Rash and other nonspecific skin eruption: Secondary | ICD-10-CM | POA: Diagnosis not present

## 2021-01-02 ENCOUNTER — Other Ambulatory Visit: Payer: Medicare Other

## 2021-01-02 ENCOUNTER — Ambulatory Visit: Payer: Medicare Other | Admitting: Hematology and Oncology

## 2021-01-04 DIAGNOSIS — Z20828 Contact with and (suspected) exposure to other viral communicable diseases: Secondary | ICD-10-CM | POA: Diagnosis not present

## 2021-01-04 DIAGNOSIS — R7301 Impaired fasting glucose: Secondary | ICD-10-CM | POA: Diagnosis not present

## 2021-01-04 DIAGNOSIS — E785 Hyperlipidemia, unspecified: Secondary | ICD-10-CM | POA: Diagnosis not present

## 2021-01-04 DIAGNOSIS — R0602 Shortness of breath: Secondary | ICD-10-CM | POA: Diagnosis not present

## 2021-01-04 DIAGNOSIS — Z79899 Other long term (current) drug therapy: Secondary | ICD-10-CM | POA: Diagnosis not present

## 2021-01-04 DIAGNOSIS — Z0000 Encounter for general adult medical examination without abnormal findings: Secondary | ICD-10-CM | POA: Diagnosis not present

## 2021-01-08 ENCOUNTER — Other Ambulatory Visit: Payer: Self-pay | Admitting: Hematology and Oncology

## 2021-01-08 DIAGNOSIS — H5213 Myopia, bilateral: Secondary | ICD-10-CM | POA: Diagnosis not present

## 2021-01-08 DIAGNOSIS — H35373 Puckering of macula, bilateral: Secondary | ICD-10-CM | POA: Diagnosis not present

## 2021-01-08 NOTE — Progress Notes (Signed)
[facility] NOTE  Patient Care Team: Jonathon Jordan, MD as PCP - General (Family Medicine) End, Harrell Gave, MD as PCP - Cardiology (Cardiology) Gaynelle Arabian, MD (Orthopedic Surgery)  CHIEF COMPLAINTS/PURPOSE OF CONSULTATION:  Follow up for hemochromatosis and phlebotomy needs.  ASSESSMENT & PLAN:   Hereditary hemochromatosis ([place]) This is a very pleasant 75 year old with heterozygous C66 Y, baseline ferritin of 1100 and some transaminitis at diagnosis referred to hematology for further recommendations. She has been undergoing monthly phlebotomy.  Most recently she was seen by Dr. Sonny Masters from ID who suggested she may have porphyria.  She cannot exactly remember the type of porphyria but she was suggested to proceed with phlebotomy and avoid sun exposure. We have discussed that repeated phlebotomy to reduce hepatic iron content is an effective and widely excepted treatment for PCT.  A common cause of iron accumulation and PCP is due to reduced production of hep C and by the liver along with her heterozygous C282Y gene.   Due to reduction in production of  hepcidin, there is failure to appropriately down regulate iron absorption. We changed the phlebotomy frequency to every 2 weeks.  We will proceed with port placement for easy access for phlebotomy. She will continue phlebotomy as scheduled and return to clinic in about 6 weeks.  She should continue follow-up with her ID and primary care doctor for further recommendations.  Orders Placed This Encounter  Procedures   IR IMAGING GUIDED PORT INSERTION    Standing Status:   Future    Standing Expiration Date:   01/09/2022    Order Specific Question:   Reason for Exam (SYMPTOM  OR DIAGNOSIS REQUIRED)    Answer:   Needs routine phlebotomies for hemochromatosis, her peripheral access is very challenging    Order Specific Question:   Preferred Imaging Location?    Answer:   [facility]     HISTORY OF  PRESENTING ILLNESS:   Michelle [name] 75 y.o. female is here because of macrocytosis.  This is a very pleasant 75 year old female patient with past medical history significant for hypertension, dyslipidemia, chronic lower back pain referred to hematology for evaluation of macrocytosis and hemochromatosis.  Interim History  She is here for a FU.  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 Her infectious disease doctor also recommended continuing phlebotomy to bring down the ferritin, minimize alcohol and avoiding sun exposure. She is hoping to change her phlebotomies to every 2 weeks but she is very worried about her axis since she is a very hard access.  She was hoping to get a port for further phlebotomies. Besides the skin rash, she denies any other complaints.  MEDICAL HISTORY:  Past Medical History:  Diagnosis Date   Acute hypoxemic respiratory failure ([place]) 04/24/2017   Anxiety    Arthritis    "back" (04/24/2017)   Blurry vision, left eye    "YAG on the left; to be repaired" (04/24/2017)   Chondromalacia of knee    right knee   Chronic bronchitis ([facility])    Chronic lower back pain    DDD (degenerative disc disease) 07-24-11   Osteoarthritis-s/p LTKA 2'11, now right planned   Depression 07-24-11   tx. depression only   Fatty liver    History of diverticulitis 02/2018   Hx of adenomatous polyp of colon 03/21/2014   Hyperlipidemia    Hypertension  IBS (irritable bowel syndrome)    OSA on CPAP 12/07/2016   Pneumonia 1990s X 1   SOB (shortness of breath)    Vitamin D deficiency     SURGICAL HISTORY: Past Surgical History:  Procedure Laterality Date   APPENDECTOMY  1967   BOTOX INJECTION     "face"   CATARACT EXTRACTION W/ INTRAOCULAR LENS IMPLANT Bilateral 2000s   CESAREAN SECTION  1972; 1975   COLONOSCOPY W/ BIOPSIES AND POLYPECTOMY  "a few"   DILATION AND  CURETTAGE OF UTERUS     HYSTEROSCOPY WITH D & C  08/29/2006   and resection of endometrial polyp/notes 09/10/2010   JOINT REPLACEMENT     KNEE ARTHROSCOPY Bilateral    NASAL SEPTUM SURGERY  1972   OOPHORECTOMY Right 1994   TOTAL KNEE ARTHROPLASTY  08/05/2011   Procedure: TOTAL KNEE ARTHROPLASTY;  Surgeon: Gearlean Alf, MD;  Location: [facility];  Service: Orthopedics;  Laterality: Right;   TOTAL KNEE ARTHROPLASTY Left 05/2009   TUBAL LIGATION  1975    SOCIAL HISTORY: Social History   Socioeconomic History   Marital status: Divorced    Spouse name: Not on file   Number of children: 2   Years of education: Not on file   Highest education level: Not on file  Occupational History   Occupation: Retired Secretary/administrator  Tobacco Use   Smoking status: Former    Packs/day: 0.12    Years: 36.00    Pack years: 4.32    Types: Cigarettes    Quit date: 06/19/2014    Years since quitting: 6.5   Smokeless tobacco: Never  Vaping Use   Vaping Use: Never used  Substance and Sexual Activity   Alcohol use: Yes    Alcohol/week: 21.0 standard drinks    Types: 21 Glasses of wine per week    Comment: 04/24/2017 "3, glasses of white wine qd"   Drug use: No   Sexual activity: Never  Other Topics Concern   Not on file  Social History Narrative   Not on file   Social Determinants of Health   Financial Resource Strain: Not on file  Food Insecurity: Not on file  Transportation Needs: Not on file  Physical Activity: Not on file  Stress: Not on file  Social Connections: Not on file  Intimate Partner Violence: Not on file    FAMILY HISTORY: Family History  Problem Relation Age of Onset   Heart disease Mother        Rheumatic heart disease   Pulmonary embolism Mother    Heart disease Father        valvular heart disease   Stroke Father    Hypertension Sister    Colon cancer Neg Hx    Esophageal cancer Neg Hx    Rectal cancer Neg Hx    Stomach cancer Neg Hx    Pancreatic cancer Neg Hx      ALLERGIES:  has No Known Allergies.  MEDICATIONS:  Current Outpatient Medications  Medication Sig Dispense Refill   cholecalciferol (VITAMIN D3) 25 MCG (1000 UT) tablet Take 1,000 Units by mouth daily.     losartan (COZAAR) 50 MG tablet Take 50 mg by mouth daily.     pyridOXINE (VITAMIN B-6) 100 MG tablet Take 100 mg by mouth daily.     sertraline (ZOLOFT) 100 MG tablet Take 100 mg by mouth 2 (two) times a day.   0   vitamin B-12 (CYANOCOBALAMIN) 100 MCG tablet Take 100 mcg by mouth  daily.     Zinc 25 MG TABS Take 1 tablet by mouth daily.     No current facility-administered medications for this visit.   PHYSICAL EXAMINATION:  ECOG PERFORMANCE STATUS: 0 - Asymptomatic  Vitals:   01/09/21 1340  BP: 136/79  Pulse: 92  Resp: 19  Temp: (!) 97.5 F (36.4 C)  SpO2: 97%   Filed Weights   01/09/21 1340  Weight: 238 lb (108 kg)   Physical Exam Skin:    Findings: Rash (Macular rash noted on the trunk) present.     LABORATORY DATA:  I have reviewed the data as listed Lab Results  Component Value Date   WBC 5.1 01/09/2021   HGB 14.9 01/09/2021   HCT 42.3 01/09/2021   MCV 99.5 01/09/2021   PLT 172 01/09/2021     Chemistry      Component Value Date/Time   NA 138 01/09/2021 1330   NA 139 02/18/2018 1217   K 4.2 01/09/2021 1330   CL 105 01/09/2021 1330   CO2 22 01/09/2021 1330   BUN 25 (H) 01/09/2021 1330   BUN 30 (H) 02/18/2018 1217   CREATININE 0.99 01/09/2021 1330   CREATININE 0.98 10/03/2020 1301      Component Value Date/Time   CALCIUM 9.3 01/09/2021 1330   ALKPHOS 75 01/09/2021 1330   AST 42 (H) 01/09/2021 1330   AST 28 10/03/2020 1301   ALT 54 (H) 01/09/2021 1330   ALT 49 (H) 10/03/2020 1301   BILITOT 0.7 01/09/2021 1330   BILITOT 0.5 10/03/2020 1301     CBC from today showed white count of 5.1, hemoglobin of 14.9, hematocrit of 42 and platelet count 172,000 CMP is normal Iron panel and ferritin are pending from today.  Last ferritin in June was  500  RADIOGRAPHIC STUDIES: I have personally reviewed the radiological images as listed and agreed with the findings in the report. No results found.  All questions were answered. The patient knows to call the clinic with any problems, questions or concerns.  I spent 30 minutes in the care of this patient including history, review of records, counseling and coordination of care. We have called Dr. Rolinda Roan office to obtain records regarding the recent diagnosis of porphyria.  Have discussed that we need to continue phlebotomy more aggressively to bring down the ferritin faster to help with phlebotomy, she was instructed to avoid sun exposure, minimize alcohol intake and other hepatotoxins.  I agree with port placement for further management since she is very hard access to proceed with regular phlebotomies.   Benay Pike, MD 01/09/2021 2:17 PM

## 2021-01-09 ENCOUNTER — Inpatient Hospital Stay: Payer: Medicare Other | Attending: Hematology and Oncology

## 2021-01-09 ENCOUNTER — Inpatient Hospital Stay: Payer: Medicare Other | Admitting: Hematology and Oncology

## 2021-01-09 ENCOUNTER — Other Ambulatory Visit: Payer: Self-pay

## 2021-01-09 ENCOUNTER — Inpatient Hospital Stay: Payer: Medicare Other

## 2021-01-09 ENCOUNTER — Encounter: Payer: Self-pay | Admitting: Hematology and Oncology

## 2021-01-09 DIAGNOSIS — E785 Hyperlipidemia, unspecified: Secondary | ICD-10-CM | POA: Diagnosis not present

## 2021-01-09 DIAGNOSIS — G4733 Obstructive sleep apnea (adult) (pediatric): Secondary | ICD-10-CM | POA: Diagnosis not present

## 2021-01-09 DIAGNOSIS — Z79899 Other long term (current) drug therapy: Secondary | ICD-10-CM | POA: Insufficient documentation

## 2021-01-09 DIAGNOSIS — R21 Rash and other nonspecific skin eruption: Secondary | ICD-10-CM | POA: Diagnosis not present

## 2021-01-09 DIAGNOSIS — Z87891 Personal history of nicotine dependence: Secondary | ICD-10-CM | POA: Diagnosis not present

## 2021-01-09 DIAGNOSIS — I10 Essential (primary) hypertension: Secondary | ICD-10-CM | POA: Diagnosis not present

## 2021-01-09 LAB — COMPREHENSIVE METABOLIC PANEL
ALT: 54 U/L — ABNORMAL HIGH (ref 0–44)
AST: 42 U/L — ABNORMAL HIGH (ref 15–41)
Albumin: 4 g/dL (ref 3.5–5.0)
Alkaline Phosphatase: 75 U/L (ref 38–126)
Anion gap: 11 (ref 5–15)
BUN: 25 mg/dL — ABNORMAL HIGH (ref 8–23)
CO2: 22 mmol/L (ref 22–32)
Calcium: 9.3 mg/dL (ref 8.9–10.3)
Chloride: 105 mmol/L (ref 98–111)
Creatinine, Ser: 0.99 mg/dL (ref 0.44–1.00)
GFR, Estimated: >60 mL/min (ref >60)
Glucose, Bld: 167 mg/dL — ABNORMAL HIGH (ref 70–99)
Potassium: 4.2 mmol/L (ref 3.5–5.1)
Sodium: 138 mmol/L (ref 135–145)
Total Bilirubin: 0.7 mg/dL (ref 0.3–1.2)
Total Protein: 7.4 g/dL (ref 6.5–8.1)

## 2021-01-09 LAB — FERRITIN: Ferritin: 343 ng/mL — ABNORMAL HIGH (ref 11–307)

## 2021-01-09 LAB — CBC WITH DIFFERENTIAL/PLATELET
Abs Immature Granulocytes: 0.02 10*3/uL (ref 0.00–0.07)
Basophils Absolute: 0.1 10*3/uL (ref 0.0–0.1)
Basophils Relative: 1 %
Eosinophils Absolute: 0.1 10*3/uL (ref 0.0–0.5)
Eosinophils Relative: 3 %
HCT: 42.3 % (ref 36.0–46.0)
Hemoglobin: 14.9 g/dL (ref 12.0–15.0)
Immature Granulocytes: 0 %
Lymphocytes Relative: 31 %
Lymphs Abs: 1.6 10*3/uL (ref 0.7–4.0)
MCH: 35.1 pg — ABNORMAL HIGH (ref 26.0–34.0)
MCHC: 35.2 g/dL (ref 30.0–36.0)
MCV: 99.5 fL (ref 80.0–100.0)
Monocytes Absolute: 0.6 10*3/uL (ref 0.1–1.0)
Monocytes Relative: 12 %
Neutro Abs: 2.7 10*3/uL (ref 1.7–7.7)
Neutrophils Relative %: 53 %
Platelets: 172 10*3/uL (ref 150–400)
RBC: 4.25 MIL/uL (ref 3.87–5.11)
RDW: 11.8 % (ref 11.5–15.5)
WBC: 5.1 10*3/uL (ref 4.0–10.5)
nRBC: 0 % (ref 0.0–0.2)

## 2021-01-09 LAB — IRON AND TIBC
Iron: 114 ug/dL (ref 41–142)
Saturation Ratios: 33 % (ref 21–57)
TIBC: 343 ug/dL (ref 236–444)
UIBC: 228 ug/dL (ref 120–384)

## 2021-01-09 NOTE — Patient Instructions (Signed)
Therapeutic Phlebotomy Therapeutic phlebotomy is the planned removal of blood from a person's body for the purpose of treating a medical condition. The procedure is similar to donating blood. Usually, about a pint (470 mL, or 0.47 L) of blood is removed.The average adult has 9-12 pints (4.3-5.7 L) of blood in the body. Therapeutic phlebotomy may be used to treat the following medical conditions: Hemochromatosis. This is a condition in which the blood contains too much iron. Polycythemia vera. This is a condition in which the blood contains too many red blood cells. Porphyria cutanea tarda. This is a disease in which an important part of hemoglobin is not made properly. It results in the buildup of abnormal amounts of porphyrins in the body. Sickle cell disease. This is a condition in which the red blood cells form an abnormal crescent shape rather than a round shape. Tell a health care provider about: Any allergies you have. All medicines you are taking, including vitamins, herbs, eye drops, creams, and over-the-counter medicines. Any problems you or family members have had with anesthetic medicines. Any blood disorders you have. Any surgeries you have had. Any medical conditions you have. Whether you are pregnant or may be pregnant. What are the risks? Generally, this is a safe procedure. However, problems may occur, including: Nausea or light-headedness. Low blood pressure (hypotension). Soreness, bleeding, swelling, or bruising at the needle insertion site. Infection. What happens before the procedure? Follow instructions from your health care provider about eating or drinking restrictions. Ask your health care provider about: Changing or stopping your regular medicines. This is especially important if you are taking diabetes medicines or blood thinners (anticoagulants). Taking medicines such as aspirin and ibuprofen. These medicines can thin your blood. Do not take these medicines unless  your health care provider tells you to take them. Taking over-the-counter medicines, vitamins, herbs, and supplements. Wear clothing with sleeves that can be raised above the elbow. Plan to have someone take you home from the hospital or clinic. You may have a blood sample taken. Your blood pressure, pulse rate, and breathing rate will be measured. What happens during the procedure?  To lower your risk of infection: Your health care team will wash or sanitize their hands. Your skin will be cleaned with an antiseptic. You may be given a medicine to numb the area (local anesthetic). A tourniquet will be placed on your arm. A needle will be inserted into one of your veins. Tubing and a collection bag will be attached to that needle. Blood will flow through the needle and tubing into the collection bag. The collection bag will be placed lower than your arm to allow gravity to help the flow of blood into the bag. You may be asked to open and close your hand slowly and continually during the entire collection. After the specified amount of blood has been removed from your body, the collection bag and tubing will be clamped. The needle will be removed from your vein. Pressure will be held on the site of the needle insertion to stop the bleeding. A bandage (dressing) will be placed over the needle insertion site. The procedure may vary among health care providers and hospitals. What happens after the procedure? Your blood pressure, pulse rate, and breathing rate will be measured after the procedure. You will be encouraged to drink fluids. Your recovery will be assessed and monitored. You can return to your normal activities as told by your health care provider. Summary Therapeutic phlebotomy is the planned removal of   blood from a person's body for the purpose of treating a medical condition. Therapeutic phlebotomy may be used to treat hemochromatosis, polycythemia vera, porphyria cutanea tarda,  or sickle cell disease. In the procedure, a needle is inserted and about a pint (470 mL, or 0.47 L) of blood is removed. The average adult has 9-12 pints (4.3-5.7 L) of blood in the body. This is generally a safe procedure, but it can sometimes cause problems such as nausea, light-headedness, or low blood pressure (hypotension). This information is not intended to replace advice given to you by your health care provider. Make sure you discuss any questions you have with your healthcare provider. Document Revised: 04/24/2017 Document Reviewed: 04/24/2017 Elsevier Patient Education  2022 [employer]  

## 2021-01-09 NOTE — Progress Notes (Signed)
Pt presented for a phlebotomy procedure. 20g IV catheter used in upper right forearm. Started at 1434 and ended at 24. 508g obtained and pt offered food and drink. VSS and pt discharged in stable condition after 30 min observation, ambulatory to lobby

## 2021-01-09 NOTE — Assessment & Plan Note (Signed)
This is a very pleasant 75 year old with heterozygous C68 Y, baseline ferritin of 1100 and some transaminitis at diagnosis referred to hematology for further recommendations. She has been undergoing monthly phlebotomy.  Most recently she was seen by Dr. Sonny Masters from ID who suggested she may have porphyria.  She cannot exactly remember the type of porphyria but she was suggested to proceed with phlebotomy and avoid sun exposure. We have discussed that repeated phlebotomy to reduce hepatic iron content is an effective and widely excepted treatment for PCT.  A common cause of iron accumulation and PCP is due to reduced production of hep C and by the liver along with her heterozygous C282Y gene.   Due to reduction in production of  hepcidin, there is failure to appropriately down regulate iron absorption. We changed the phlebotomy frequency to every 2 weeks.  We will proceed with port placement for easy access for phlebotomy. She will continue phlebotomy as scheduled and return to clinic in about 6 weeks.  She should continue follow-up with her ID and primary care doctor for further recommendations.

## 2021-01-17 ENCOUNTER — Other Ambulatory Visit: Payer: Self-pay | Admitting: Physician Assistant

## 2021-01-18 ENCOUNTER — Ambulatory Visit (HOSPITAL_COMMUNITY)
Admission: RE | Admit: 2021-01-18 | Discharge: 2021-01-18 | Disposition: A | Discharge status: Home / Self Care | Payer: Medicare Other | Source: Ambulatory Visit | Attending: Hematology and Oncology | Admitting: Hematology and Oncology

## 2021-01-18 ENCOUNTER — Other Ambulatory Visit: Payer: Self-pay

## 2021-01-18 ENCOUNTER — Encounter (HOSPITAL_COMMUNITY): Payer: Self-pay

## 2021-01-18 DIAGNOSIS — Z452 Encounter for adjustment and management of vascular access device: Secondary | ICD-10-CM | POA: Diagnosis not present

## 2021-01-18 DIAGNOSIS — Z79899 Other long term (current) drug therapy: Secondary | ICD-10-CM | POA: Insufficient documentation

## 2021-01-18 DIAGNOSIS — Z87891 Personal history of nicotine dependence: Secondary | ICD-10-CM | POA: Diagnosis not present

## 2021-01-18 HISTORY — PX: IR IMAGING GUIDED PORT INSERTION: IMG5740

## 2021-01-18 MED ORDER — LIDOCAINE-EPINEPHRINE (PF) 2 %-1:200000 IJ SOLN
INTRAMUSCULAR | Status: AC
  Filled 2021-01-18: qty 20

## 2021-01-18 MED ORDER — MIDAZOLAM HCL 2 MG/2ML IJ SOLN
INTRAMUSCULAR | Status: DC | PRN
  Administered 2021-01-18: .5 mg via INTRAVENOUS
  Administered 2021-01-18 (×2): 1 mg via INTRAVENOUS
  Administered 2021-01-18 (×2): .5 mg via INTRAVENOUS

## 2021-01-18 MED ORDER — HEPARIN SOD (PORK) LOCK FLUSH 100 UNIT/ML IV SOLN
INTRAVENOUS | Status: AC
  Filled 2021-01-18: qty 5

## 2021-01-18 MED ORDER — MIDAZOLAM HCL 2 MG/2ML IJ SOLN
INTRAMUSCULAR | Status: AC
  Filled 2021-01-18: qty 2

## 2021-01-18 MED ORDER — FENTANYL CITRATE (PF) 100 MCG/2ML IJ SOLN
INTRAMUSCULAR | Status: DC | PRN
  Administered 2021-01-18: 25 ug via INTRAVENOUS
  Administered 2021-01-18 (×2): 50 ug via INTRAVENOUS
  Administered 2021-01-18 (×2): 25 ug via INTRAVENOUS

## 2021-01-18 MED ORDER — SODIUM CHLORIDE 0.9 % IV SOLN: INTRAVENOUS | Status: DC

## 2021-01-18 MED ORDER — FENTANYL CITRATE (PF) 100 MCG/2ML IJ SOLN
INTRAMUSCULAR | Status: AC
  Filled 2021-01-18: qty 2

## 2021-01-18 NOTE — Procedures (Signed)
Pre Procedure Dx: Poor venous access Post Procedural Dx: Same  Successful placement of right IJ approach port-a-cath with tip at the superior caval atrial junction. The catheter is ready for immediate use.  Estimated Blood Loss: Minimal  Complications: None immediate.  Jay Elia Keenum, MD Pager #: [phone]   

## 2021-01-18 NOTE — H&P (Signed)
Chief Complaint: Patient was seen in consultation today for Hereditary hemochromatosis   at the request of [place]  Supervising Physician: Sandi Mariscal  Patient Status: [name]  History of Present Illness: Michelle [name] is a 75 y.o. female with hereditary hemochromatosis requiring phlebotomy every two weeks due to reduction in production of  hepcidin, and failure to appropriately down regulate iron absorption.  We have been asked to place a port for easy access for phlebotomy.  Past Medical History:  Diagnosis Date  . Acute hypoxemic respiratory failure ([place]) 04/24/2017  . Anxiety   . Arthritis    "back" (04/24/2017)  . 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 Chondromalacia of knee    right knee  . Chronic bronchitis ([place])   . Chronic lower back pain   . DDD (degenerative disc disease) 07-24-11   Osteoarthritis-s/p LTKA 2'11, now right planned  . Depression 07-24-11   tx. depression only  . Fatty liver   . History of diverticulitis 02/2018  . Hx of adenomatous polyp of colon 03/21/2014  . Hyperlipidemia   . Hypertension   . IBS (irritable bowel syndrome)   . OSA on CPAP 12/07/2016  . Pneumonia 1990s X 1  . SOB (shortness of breath)   . Vitamin D deficiency     Past Surgical History:  Procedure Laterality Date  . APPENDECTOMY  1967  . BOTOX INJECTION     "face"  . CATARACT EXTRACTION W/ INTRAOCULAR LENS IMPLANT Bilateral 2000s  . [place]; 1975  . COLONOSCOPY W/ BIOPSIES AND POLYPECTOMY  "a few"  . DILATION AND CURETTAGE OF UTERUS    . HYSTEROSCOPY WITH D & C  08/29/2006   and resection of endometrial polyp/notes 09/10/2010  . JOINT REPLACEMENT    . KNEE ARTHROSCOPY Bilateral   . NASAL SEPTUM SURGERY  1972  . OOPHORECTOMY Right 1994  . TOTAL KNEE ARTHROPLASTY  08/05/2011   Procedure: TOTAL KNEE ARTHROPLASTY;  Surgeon: Gearlean Alf, MD;  Location: [facility];  Service: Orthopedics;  Laterality: Right;  .  TOTAL KNEE ARTHROPLASTY Left 05/2009  . TUBAL LIGATION  1975    Allergies: Patient has no known allergies.  Medications: Prior to Admission medications   Medication Sig Start Date End Date Taking? Authorizing Provider  cholecalciferol (VITAMIN D3) 25 MCG (1000 UT) tablet Take 1,000 Units by mouth daily.    [provider]  losartan (COZAAR) 50 MG tablet Take 50 mg by mouth daily. 02/09/20   [provider]  pyridOXINE (VITAMIN B-6) 100 MG tablet Take 100 mg by mouth daily.    [provider]  sertraline (ZOLOFT) 100 MG tablet Take 100 mg by mouth 2 (two) times a day.  03/07/17   [provider]  vitamin B-12 (CYANOCOBALAMIN) 100 MCG tablet Take 100 mcg by mouth daily.    [provider]  Zinc 25 MG TABS Take 1 tablet by mouth daily.    [provider]     Family History  Problem Relation Age of Onset  . Heart disease Mother        Rheumatic heart disease  . Pulmonary embolism Mother   . Heart disease Father        valvular heart disease  . Stroke Father   . Hypertension Sister   . Colon cancer Neg Hx   . Esophageal cancer Neg Hx   . Rectal cancer Neg Hx   .  Stomach cancer Neg Hx   . Pancreatic cancer Neg Hx     Social History   Socioeconomic History  . Marital status: Divorced    Spouse name: Not on file  . Number of children: 2  . Years of education: Not on file  . Highest education level: Not on file  Occupational History  . Occupation: Retired Secretary/administrator  Tobacco Use  . Smoking status: Former    Packs/day: 0.12    Years: 36.00    Pack years: 4.32    Types: Cigarettes    Quit date: 06/19/2014    Years since quitting: 6.5  . Smokeless tobacco: Never  Vaping Use  . Vaping Use: Never used  Substance and Sexual Activity  . Alcohol use: Yes    Alcohol/week: 21.0 standard drinks    Types: 21 Glasses of wine per week    Comment: 04/24/2017 "3, glasses of white wine qd"  . Drug use: No  . Sexual activity: Never   Other Topics Concern  . Not on file  Social History Narrative  . Not on file   Social Determinants of Health   Financial Resource Strain: Not on file  Food Insecurity: Not on file  Transportation Needs: Not on file  Physical Activity: Not on file  Stress: Not on file  Social Connections: Not on file    Review of Systems: A 12 point ROS discussed and pertinent positives are indicated in the HPI above.  All other systems are negative.  Review of Systems  Constitutional:  Positive for fatigue.  Respiratory: Negative.    Cardiovascular: Negative.   Gastrointestinal: Negative.   Skin:  Positive for rash.  Hematological:  Bruises/bleeds easily.   Vital Signs: There were no vitals taken for this visit.  Physical Exam Constitutional:      General: She is not in acute distress.    Appearance: Normal appearance. She is obese. She is not ill-appearing.  HENT:     Head: Normocephalic and atraumatic.  Cardiovascular:     Rate and Rhythm: Normal rate and regular rhythm.     Pulses: Normal pulses.  Pulmonary:     Effort: Pulmonary effort is normal.  Abdominal:     General: Abdomen is flat.     Palpations: Abdomen is soft.  Skin:    General: Skin is warm and dry.     Findings: Bruising and rash present.  Neurological:     Mental Status: She is alert.  Psychiatric:        Mood and Affect: Mood normal.        Behavior: Behavior normal.    Imaging: No results found.  Labs:  CBC: Recent Labs    10/03/20 1301 11/01/20 1427 11/27/20 1533 01/09/21 1330  WBC 4.9 4.8 5.6 5.1  HGB 14.0 13.9 14.1 14.9  HCT 40.6 40.6 40.9 42.3  PLT 156 169 178 172    COAGS: No results for input(s): INR, APTT in the last 8760 hours.  BMP: Recent Labs    05/23/20 1413 10/03/20 1301 01/09/21 1330  NA 140 140 138  K 4.8 4.8 4.2  CL 104 104 105  CO2 27 27 22   GLUCOSE 100* 153* 167*  BUN 23 21 25*  CALCIUM 9.3 9.2 9.3  CREATININE 1.03* 0.98 0.99  GFRNONAA 58* >60 >60    LIVER  FUNCTION TESTS: Recent Labs    05/23/20 1413 10/03/20 1301 01/09/21 1330  BILITOT 0.7 0.5 0.7  AST 30 28 42*  ALT 46* 49*  54*  ALKPHOS 84 71 75  PROT 7.6 7.1 7.4  ALBUMIN 4.0 3.7 4.0     Assessment and Plan:  Hereditary hemochromatosis --needs phlebotomy q 2 weeks --will proceed with planned port-a-catheter placement.  Risks and benefits of image guided port-a-catheter placement was discussed with the patient including, but not limited to bleeding, infection, pneumothorax, or fibrin sheath development and need for additional procedures.  All of the patient's questions were answered, patient is agreeable to proceed. Consent signed and in chart.   Thank you for this interesting consult.  I greatly enjoyed meeting MARTITA BRUMM and look forward to participating in their care.  A copy of this report was sent to the requesting provider on this date.  Electronically Signed: Pasty Spillers, PA 01/18/2021, 12:16 PM   I spent a total of 15 Minutes  in face to face in clinical consultation, greater than 50% of which was counseling/coordinating care for hemochromatosis

## 2021-01-23 ENCOUNTER — Inpatient Hospital Stay: Payer: Medicare Other | Attending: Hematology and Oncology

## 2021-01-23 ENCOUNTER — Inpatient Hospital Stay: Payer: Medicare Other

## 2021-01-23 ENCOUNTER — Other Ambulatory Visit: Payer: Self-pay

## 2021-01-23 VITALS — BP 120/69 | HR 74 | Temp 97.7°F | Resp 16

## 2021-01-23 DIAGNOSIS — R0600 Dyspnea, unspecified: Secondary | ICD-10-CM | POA: Insufficient documentation

## 2021-01-23 DIAGNOSIS — Z95828 Presence of other vascular implants and grafts: Secondary | ICD-10-CM

## 2021-01-23 DIAGNOSIS — R079 Chest pain, unspecified: Secondary | ICD-10-CM | POA: Insufficient documentation

## 2021-01-23 LAB — CBC WITH DIFFERENTIAL/PLATELET
Abs Immature Granulocytes: 0.01 10*3/uL (ref 0.00–0.07)
Basophils Absolute: 0.1 10*3/uL (ref 0.0–0.1)
Basophils Relative: 2 %
Eosinophils Absolute: 0.2 10*3/uL (ref 0.0–0.5)
Eosinophils Relative: 3 %
HCT: 40.8 % (ref 36.0–46.0)
Hemoglobin: 14.1 g/dL (ref 12.0–15.0)
Immature Granulocytes: 0 %
Lymphocytes Relative: 37 %
Lymphs Abs: 2 10*3/uL (ref 0.7–4.0)
MCH: 34.7 pg — ABNORMAL HIGH (ref 26.0–34.0)
MCHC: 34.6 g/dL (ref 30.0–36.0)
MCV: 100.5 fL — ABNORMAL HIGH (ref 80.0–100.0)
Monocytes Absolute: 0.5 10*3/uL (ref 0.1–1.0)
Monocytes Relative: 10 %
Neutro Abs: 2.5 10*3/uL (ref 1.7–7.7)
Neutrophils Relative %: 48 %
Platelets: 190 10*3/uL (ref 150–400)
RBC: 4.06 MIL/uL (ref 3.87–5.11)
RDW: 11.6 % (ref 11.5–15.5)
WBC: 5.2 10*3/uL (ref 4.0–10.5)
nRBC: 0 % (ref 0.0–0.2)

## 2021-01-23 LAB — COMPREHENSIVE METABOLIC PANEL
ALT: 49 U/L — ABNORMAL HIGH (ref 0–44)
AST: 34 U/L (ref 15–41)
Albumin: 3.7 g/dL (ref 3.5–5.0)
Alkaline Phosphatase: 69 U/L (ref 38–126)
Anion gap: 12 (ref 5–15)
BUN: 20 mg/dL (ref 8–23)
CO2: 24 mmol/L (ref 22–32)
Calcium: 9.4 mg/dL (ref 8.9–10.3)
Chloride: 103 mmol/L (ref 98–111)
Creatinine, Ser: 1.11 mg/dL — ABNORMAL HIGH (ref 0.44–1.00)
GFR, Estimated: 52 mL/min — ABNORMAL LOW (ref >60)
Glucose, Bld: 190 mg/dL — ABNORMAL HIGH (ref 70–99)
Potassium: 4.1 mmol/L (ref 3.5–5.1)
Sodium: 139 mmol/L (ref 135–145)
Total Bilirubin: 0.6 mg/dL (ref 0.3–1.2)
Total Protein: 7.1 g/dL (ref 6.5–8.1)

## 2021-01-23 MED ORDER — HEPARIN SOD (PORK) LOCK FLUSH 100 UNIT/ML IV SOLN
500.0000 [IU] | Freq: Once | INTRAVENOUS | Status: AC
  Administered 2021-01-23: 500 [IU] via INTRAVENOUS

## 2021-01-23 MED ORDER — SODIUM CHLORIDE 0.9% FLUSH
10.0000 mL | Freq: Once | INTRAVENOUS | Status: AC
  Administered 2021-01-23: 10 mL via INTRAVENOUS

## 2021-01-23 NOTE — Progress Notes (Signed)
Clabe Seal presents today for phlebotomy per MD orders. Phlebotomy procedure started at 1551 and ended at 1709, using port access. Approximately 400 ml removed before the line clotted. Patient observed for 30 minutes after procedure without any incident. Patient tolerated procedure well. IV needle removed intact. Phlebotomy was done using 18ml syringes, patient began to complain of mild dizziness about halfway through, which then resolved after slowing procedure down. Vitals stable and patient in no distress upon leaving infusion center.

## 2021-01-23 NOTE — Patient Instructions (Signed)
Therapeutic Phlebotomy Therapeutic phlebotomy is the planned removal of blood from a person's body for the purpose of treating a medical condition. The procedure is similar to donating blood. Usually, about a pint (470 mL, or 0.47 L) of blood is removed.The average adult has 9-12 pints (4.3-5.7 L) of blood in the body. Therapeutic phlebotomy may be used to treat the following medical conditions: Hemochromatosis. This is a condition in which the blood contains too much iron. Polycythemia vera. This is a condition in which the blood contains too many red blood cells. Porphyria cutanea tarda. This is a disease in which an important part of hemoglobin is not made properly. It results in the buildup of abnormal amounts of porphyrins in the body. Sickle cell disease. This is a condition in which the red blood cells form an abnormal crescent shape rather than a round shape. Tell a health care provider about: Any allergies you have. All medicines you are taking, including vitamins, herbs, eye drops, creams, and over-the-counter medicines. Any problems you or family members have had with anesthetic medicines. Any blood disorders you have. Any surgeries you have had. Any medical conditions you have. Whether you are pregnant or may be pregnant. What are the risks? Generally, this is a safe procedure. However, problems may occur, including: Nausea or light-headedness. Low blood pressure (hypotension). Soreness, bleeding, swelling, or bruising at the needle insertion site. Infection. What happens before the procedure? Follow instructions from your health care provider about eating or drinking restrictions. Ask your health care provider about: Changing or stopping your regular medicines. This is especially important if you are taking diabetes medicines or blood thinners (anticoagulants). Taking medicines such as aspirin and ibuprofen. These medicines can thin your blood. Do not take these medicines unless  your health care provider tells you to take them. Taking over-the-counter medicines, vitamins, herbs, and supplements. Wear clothing with sleeves that can be raised above the elbow. Plan to have someone take you home from the hospital or clinic. You may have a blood sample taken. Your blood pressure, pulse rate, and breathing rate will be measured. What happens during the procedure?  To lower your risk of infection: Your health care team will wash or sanitize their hands. Your skin will be cleaned with an antiseptic. You may be given a medicine to numb the area (local anesthetic). A tourniquet will be placed on your arm. A needle will be inserted into one of your veins. Tubing and a collection bag will be attached to that needle. Blood will flow through the needle and tubing into the collection bag. The collection bag will be placed lower than your arm to allow gravity to help the flow of blood into the bag. You may be asked to open and close your hand slowly and continually during the entire collection. After the specified amount of blood has been removed from your body, the collection bag and tubing will be clamped. The needle will be removed from your vein. Pressure will be held on the site of the needle insertion to stop the bleeding. A bandage (dressing) will be placed over the needle insertion site. The procedure may vary among health care providers and hospitals. What happens after the procedure? Your blood pressure, pulse rate, and breathing rate will be measured after the procedure. You will be encouraged to drink fluids. Your recovery will be assessed and monitored. You can return to your normal activities as told by your health care provider. Summary Therapeutic phlebotomy is the planned removal of   blood from a person's body for the purpose of treating a medical condition. Therapeutic phlebotomy may be used to treat hemochromatosis, polycythemia vera, porphyria cutanea tarda,  or sickle cell disease. In the procedure, a needle is inserted and about a pint (470 mL, or 0.47 L) of blood is removed. The average adult has 9-12 pints (4.3-5.7 L) of blood in the body. This is generally a safe procedure, but it can sometimes cause problems such as nausea, light-headedness, or low blood pressure (hypotension). This information is not intended to replace advice given to you by your health care provider. Make sure you discuss any questions you have with your healthcare provider. Document Revised: 04/24/2017 Document Reviewed: 04/24/2017 Elsevier Patient Education  2022 [employer]  

## 2021-01-25 ENCOUNTER — Telehealth: Payer: Self-pay | Admitting: Nurse Practitioner

## 2021-01-25 NOTE — Telephone Encounter (Signed)
Scheduled per sch msg. Called and left msg with change of appt time change

## 2021-01-29 DIAGNOSIS — L720 Epidermal cyst: Secondary | ICD-10-CM | POA: Diagnosis not present

## 2021-01-29 DIAGNOSIS — L940 Localized scleroderma [morphea]: Secondary | ICD-10-CM | POA: Diagnosis not present

## 2021-02-06 ENCOUNTER — Other Ambulatory Visit: Payer: Medicare Other

## 2021-02-06 ENCOUNTER — Other Ambulatory Visit: Payer: Self-pay

## 2021-02-06 ENCOUNTER — Inpatient Hospital Stay: Payer: Medicare Other

## 2021-02-06 DIAGNOSIS — Z95828 Presence of other vascular implants and grafts: Secondary | ICD-10-CM

## 2021-02-06 DIAGNOSIS — D7589 Other specified diseases of blood and blood-forming organs: Secondary | ICD-10-CM

## 2021-02-06 LAB — CBC WITH DIFFERENTIAL/PLATELET
Abs Immature Granulocytes: 0.01 10*3/uL (ref 0.00–0.07)
Basophils Absolute: 0.1 10*3/uL (ref 0.0–0.1)
Basophils Relative: 1 %
Eosinophils Absolute: 0.2 10*3/uL (ref 0.0–0.5)
Eosinophils Relative: 4 %
HCT: 35.2 % — ABNORMAL LOW (ref 36.0–46.0)
Hemoglobin: 12.2 g/dL (ref 12.0–15.0)
Immature Granulocytes: 0 %
Lymphocytes Relative: 38 %
Lymphs Abs: 1.5 10*3/uL (ref 0.7–4.0)
MCH: 34.7 pg — ABNORMAL HIGH (ref 26.0–34.0)
MCHC: 34.7 g/dL (ref 30.0–36.0)
MCV: 100 fL (ref 80.0–100.0)
Monocytes Absolute: 0.5 10*3/uL (ref 0.1–1.0)
Monocytes Relative: 13 %
Neutro Abs: 1.7 10*3/uL (ref 1.7–7.7)
Neutrophils Relative %: 44 %
Platelets: 174 10*3/uL (ref 150–400)
RBC: 3.52 MIL/uL — ABNORMAL LOW (ref 3.87–5.11)
RDW: 11.9 % (ref 11.5–15.5)
WBC: 3.9 10*3/uL — ABNORMAL LOW (ref 4.0–10.5)
nRBC: 0 % (ref 0.0–0.2)

## 2021-02-06 LAB — COMPREHENSIVE METABOLIC PANEL
ALT: 46 U/L — ABNORMAL HIGH (ref 0–44)
AST: 33 U/L (ref 15–41)
Albumin: 3.4 g/dL — ABNORMAL LOW (ref 3.5–5.0)
Alkaline Phosphatase: 66 U/L (ref 38–126)
Anion gap: 11 (ref 5–15)
BUN: 17 mg/dL (ref 8–23)
CO2: 24 mmol/L (ref 22–32)
Calcium: 8.6 mg/dL — ABNORMAL LOW (ref 8.9–10.3)
Chloride: 107 mmol/L (ref 98–111)
Creatinine, Ser: 0.92 mg/dL (ref 0.44–1.00)
GFR, Estimated: >60 mL/min (ref >60)
Glucose, Bld: 104 mg/dL — ABNORMAL HIGH (ref 70–99)
Potassium: 4.4 mmol/L (ref 3.5–5.1)
Sodium: 142 mmol/L (ref 135–145)
Total Bilirubin: 0.6 mg/dL (ref 0.3–1.2)
Total Protein: 6.4 g/dL — ABNORMAL LOW (ref 6.5–8.1)

## 2021-02-06 MED ORDER — SODIUM CHLORIDE 0.9% FLUSH
10.0000 mL | Freq: Once | INTRAVENOUS | Status: AC
  Administered 2021-02-06: 10 mL via INTRAVENOUS

## 2021-02-06 NOTE — Progress Notes (Signed)
HCT 35.2. Order parameters in to only perform therapeutic phlebotomy for HCT >36. Verified with Dr Chryl Heck who states okay to hold phlebotomy today. Patient informed and verbalized understanding. No issues verbalized.

## 2021-02-18 ENCOUNTER — Other Ambulatory Visit: Payer: Self-pay | Admitting: Nurse Practitioner

## 2021-02-18 NOTE — Progress Notes (Addendum)
[place]   [phone] [phone]   Clinic Follow up Note   Patient Care Team: Jonathon Jordan, MD as PCP - General (Family Medicine) End, Harrell Gave, MD as PCP - Cardiology (Cardiology) Gaynelle Arabian, MD (Orthopedic Surgery) Date of Service: 02/19/21   CHIEF COMPLAINT: Follow up hereditary hemochromatosis   CURRENT THERAPY: therapeutic phlebotomy if HCT >36 and to maintain ferritin < 500, recently done q2 weeks starting 01/09/21  INTERVAL HISTORY: Michelle [name] returns for follow up and phlebotomy as scheduled. Last seen by Dr. Chryl Heck 9/20 and phlebotomies changed to q2 weeks. She underwent PAC placement on 10/29. HCT was 34 on 10/18 and phlebotomy was held.  She usually tolerates phlebotomies well.  Since last visit she has developed left chest pain and pressure that is mostly constant, worse with deep inspiration and certain activities such as exertion and stairs.  She has shortness of breath she first noticed on a trip to [place] in April which has progressively worsened.  She has activity intolerance, able to ambulate ambulate only short distance before needing a break.  She initially thought this was related to her weight.  Her CPAP always falls off at night.  She denies lightheadedness dizziness, cough.   MEDICAL HISTORY:  Past Medical History:  Diagnosis Date   Acute hypoxemic respiratory failure ([place]) 04/24/2017   Anxiety    Arthritis    "back" (04/24/2017)   Blurry vision, left eye    "YAG on the left; to be repaired" (04/24/2017)   Chondromalacia of knee    right knee   Chronic bronchitis ([facility])    Chronic lower back pain    DDD (degenerative disc disease) 07-24-11   Osteoarthritis-s/p LTKA 2'11, now right planned   Depression 07-24-11   tx. depression only   Fatty liver    History of diverticulitis 02/2018   Hx of adenomatous polyp of colon 03/21/2014   Hyperlipidemia    Hypertension    IBS (irritable bowel syndrome)    OSA on CPAP 12/07/2016    Pneumonia 1990s X 1   SOB (shortness of breath)    Vitamin D deficiency     SURGICAL HISTORY: Past Surgical History:  Procedure Laterality Date   APPENDECTOMY  1967   BOTOX INJECTION     "face"   CATARACT EXTRACTION W/ INTRAOCULAR LENS IMPLANT Bilateral 2000s   CESAREAN SECTION  1972; 1975   COLONOSCOPY W/ BIOPSIES AND POLYPECTOMY  "a few"   DILATION AND CURETTAGE OF UTERUS     HYSTEROSCOPY WITH D & C  08/29/2006   and resection of endometrial polyp/notes 09/10/2010   IR IMAGING GUIDED PORT INSERTION  01/18/2021   JOINT REPLACEMENT     KNEE ARTHROSCOPY Bilateral    NASAL SEPTUM SURGERY  1972   OOPHORECTOMY Right 1994   TOTAL KNEE ARTHROPLASTY  08/05/2011   Procedure: TOTAL KNEE ARTHROPLASTY;  Surgeon: Gearlean Alf, MD;  Location: [facility];  Service: Orthopedics;  Laterality: Right;   TOTAL KNEE ARTHROPLASTY Left 05/2009   TUBAL LIGATION  1975    I have reviewed the social history and family history with the patient and they are unchanged from previous note.  ALLERGIES:  has No Known Allergies.  MEDICATIONS:  Current Outpatient Medications  Medication Sig Dispense Refill   losartan (COZAAR) 50 MG tablet Take 50 mg by mouth daily.     sertraline (ZOLOFT) 100 MG tablet Take 100 mg by mouth 2 (two) times a day.   0   cholecalciferol (VITAMIN D3)  25 MCG (1000 UT) tablet Take 1,000 Units by mouth daily.     lidocaine-prilocaine (EMLA) cream Apply 1 application topically as needed. 30 g 0   vitamin B-12 (CYANOCOBALAMIN) 100 MCG tablet Take 100 mcg by mouth daily.     No current facility-administered medications for this visit.    PHYSICAL EXAMINATION:  Vitals:   02/19/21 1255  BP: (!) 144/78  Pulse: 86  Resp: 18  Temp: 98.1 F (36.7 C)  SpO2: 92%   O2 sat 94-96% on room air on ambulation, pulse did not exceed 100 bpm Filed Weights   02/19/21 1255  Weight: 235 lb 11.2 oz (106.9 kg)    GENERAL:alert, no distress and comfortable SKIN: No obvious rash to exposed  skin EYES: sclera clear NECK: Without mass or swelling LUNGS: clear to auscultation and percussion with normal breathing effort at rest, increased work of breathing on exertion HEART: regular rate & rhythm, no lower extremity edema ABDOMEN:abdomen soft, non-tender and normal bowel sounds Musculoskeletal: Focal tenderness at the left upper anterior chest NEURO: alert & oriented x 3 with fluent speech, no focal motor/sensory deficits PAC without erythema  LABORATORY DATA:  I have reviewed the data as listed CBC Latest Ref Rng & Units 02/19/2021 02/06/2021 01/23/2021  WBC 4.0 - 10.5 K/uL 5.0 3.9(L) 5.2  Hemoglobin 12.0 - 15.0 g/dL 13.5 12.2 14.1  Hematocrit 36.0 - 46.0 % 38.9 35.2(L) 40.8  Platelets 150 - 400 K/uL 188 174 190     CMP Latest Ref Rng & Units 02/19/2021 02/06/2021 01/23/2021  Glucose 70 - 99 mg/dL 121(H) 104(H) 190(H)  BUN 8 - 23 mg/dL 20 17 20   Creatinine 0.44 - 1.00 mg/dL 0.84 0.92 1.11(H)  Sodium 135 - 145 mmol/L 139 142 139  Potassium 3.5 - 5.1 mmol/L 4.4 4.4 4.1  Chloride 98 - 111 mmol/L 106 107 103  CO2 22 - 32 mmol/L 26 24 24   Calcium 8.9 - 10.3 mg/dL 8.8(L) 8.6(L) 9.4  Total Protein 6.5 - 8.1 g/dL 7.1 6.4(L) 7.1  Total Bilirubin 0.3 - 1.2 mg/dL 0.5 0.6 0.6  Alkaline Phos 38 - 126 U/L 72 66 69  AST 15 - 41 U/L 30 33 34  ALT 0 - 44 U/L 43 46(H) 49(H)      RADIOGRAPHIC STUDIES: I have personally reviewed the radiological images as listed and agreed with the findings in the report. No results found.   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  The etiology of her chest pain and dyspnea are unclear.  Exam shows tenderness at the anterior chest wall, suggesting a MSK component. She can take NSAID 2-3 x  daily for several days. EKG shows normal sinus rhythm.  No hypoxia.  We are referring her for echo to rule out cardiac abnormalities secondary to hemochromatosis. If echo is abnormal we will refer her to cardiology. She will see pulmonologist on 02/22/2021.  Labs reviewed HCT 38.9, ferritin has normalized to 191 on phleboromy q2 weeks recently.  She will proceed with phlebotomy today as scheduled.  I recommend phlebotomy in 3 and 6 weeks with follow-up with Dr. Chryl Heck in 6 weeks.   Patient seen with Dr. Burr Medico.   Orders Placed This Encounter  Procedures   EKG 12-Lead    Scheduling Instructions:  Chest pain, dyspnea   ECHOCARDIOGRAM COMPLETE    Standing Status:   Future    Standing Expiration Date:   02/19/2022    Order Specific Question:   Where should this test be performed    Answer:   [place]    Order Specific Question:   Perflutren DEFINITY (image enhancing agent) should be administered unless hypersensitivity or allergy exist    Answer:   Administer Perflutren    Order Specific Question:   Reason for exam-Echo    Answer:   Chest Pain  R07.9    Order Specific Question:   Other Comments    Answer:   hereditary hemochromatosis, chest pain   All questions were answered. The patient knows to call the clinic with any problems, questions or concerns. No barriers to learning were detected.     Alla Feeling, NP 02/20/21   Addendum  I have seen the patient, examined her. I agree with the assessment and and plan and have edited the notes.   Michelle [name] came in today for phlebotomy for his hemochromatosis.  She reports worsening dyspnea on exertion for the past few months but it has been on-going since April 2022. She also reports left-sided pleuritic chest pain since her port placement a month ago.  She is not hypoxic.  Her exam was unrevealing and EKG today was also unremarkable.  She is scheduled to see pulmonary later this week. Her chest pain is not highly suspicious for cardiac  etiology.  However due to her history of hemochromatosis we will get an echocardiogram for further evaluation.  Okay to proceed with phlebotomy today.  Patient knows to call or go to ED if her chest pain gets much worse.  We encouraged her to follow-up with her primary care physician.  Truitt Merle  02/19/2021

## 2021-02-19 ENCOUNTER — Other Ambulatory Visit: Payer: Self-pay

## 2021-02-19 ENCOUNTER — Inpatient Hospital Stay: Payer: Medicare Other | Admitting: Nurse Practitioner

## 2021-02-19 ENCOUNTER — Other Ambulatory Visit: Payer: Medicare Other

## 2021-02-19 ENCOUNTER — Inpatient Hospital Stay: Payer: Medicare Other

## 2021-02-19 DIAGNOSIS — R079 Chest pain, unspecified: Secondary | ICD-10-CM | POA: Diagnosis not present

## 2021-02-19 DIAGNOSIS — Z95828 Presence of other vascular implants and grafts: Secondary | ICD-10-CM

## 2021-02-19 DIAGNOSIS — R0600 Dyspnea, unspecified: Secondary | ICD-10-CM | POA: Diagnosis not present

## 2021-02-19 LAB — COMPREHENSIVE METABOLIC PANEL
ALT: 43 U/L (ref 0–44)
AST: 30 U/L (ref 15–41)
Albumin: 3.7 g/dL (ref 3.5–5.0)
Alkaline Phosphatase: 72 U/L (ref 38–126)
Anion gap: 7 (ref 5–15)
BUN: 20 mg/dL (ref 8–23)
CO2: 26 mmol/L (ref 22–32)
Calcium: 8.8 mg/dL — ABNORMAL LOW (ref 8.9–10.3)
Chloride: 106 mmol/L (ref 98–111)
Creatinine, Ser: 0.84 mg/dL (ref 0.44–1.00)
GFR, Estimated: >60 mL/min (ref >60)
Glucose, Bld: 121 mg/dL — ABNORMAL HIGH (ref 70–99)
Potassium: 4.4 mmol/L (ref 3.5–5.1)
Sodium: 139 mmol/L (ref 135–145)
Total Bilirubin: 0.5 mg/dL (ref 0.3–1.2)
Total Protein: 7.1 g/dL (ref 6.5–8.1)

## 2021-02-19 LAB — CBC (CANCER CENTER ONLY)
HCT: 38.9 % (ref 36.0–46.0)
Hemoglobin: 13.5 g/dL (ref 12.0–15.0)
MCH: 34.8 pg — ABNORMAL HIGH (ref 26.0–34.0)
MCHC: 34.7 g/dL (ref 30.0–36.0)
MCV: 100.3 fL — ABNORMAL HIGH (ref 80.0–100.0)
Platelet Count: 188 10*3/uL (ref 150–400)
RBC: 3.88 MIL/uL (ref 3.87–5.11)
RDW: 12 % (ref 11.5–15.5)
WBC Count: 5 10*3/uL (ref 4.0–10.5)
nRBC: 0 % (ref 0.0–0.2)

## 2021-02-19 LAB — FERRITIN: Ferritin: 191 ng/mL (ref 11–307)

## 2021-02-19 MED ORDER — SODIUM CHLORIDE 0.9% FLUSH
10.0000 mL | Freq: Once | INTRAVENOUS | Status: AC
  Administered 2021-02-19: 10 mL via INTRAVENOUS

## 2021-02-19 MED ORDER — LIDOCAINE-PRILOCAINE 2.5-2.5 % EX CREA: 1.0000 "application " | TOPICAL_CREAM | CUTANEOUS | 0 refills | Status: DC | PRN

## 2021-02-19 NOTE — Patient Instructions (Signed)

## 2021-02-19 NOTE — Progress Notes (Signed)
Clabe Seal presents today for phlebotomy per MD orders. Phlebotomy procedure started at 1424 and ended at 1522. 340 grams removed. Patient's port was accessed 2 times during the procedure. After the needle clotted on the 2nd stick, patient refused to be accessed again. Patient observed for 30 minutes after procedure without any incident. Patient tolerated procedure well.

## 2021-02-19 NOTE — Addendum Note (Signed)
Addended by: Bertram Millard A on: 02/19/2021 01:22 PM   Modules accepted: Orders

## 2021-02-20 ENCOUNTER — Encounter: Payer: Self-pay | Admitting: Hematology and Oncology

## 2021-02-20 ENCOUNTER — Encounter: Payer: Self-pay | Admitting: Nurse Practitioner

## 2021-02-20 ENCOUNTER — Telehealth: Payer: Self-pay | Admitting: Nurse Practitioner

## 2021-02-20 NOTE — Telephone Encounter (Signed)
Scheduled follow-up appointments per 10/31 los. Patient is aware. 

## 2021-02-21 ENCOUNTER — Encounter: Payer: Self-pay | Admitting: Internal Medicine

## 2021-02-21 ENCOUNTER — Telehealth: Payer: Self-pay | Admitting: *Deleted

## 2021-02-21 ENCOUNTER — Ambulatory Visit: Payer: Medicare Other | Admitting: Internal Medicine

## 2021-02-21 ENCOUNTER — Other Ambulatory Visit: Payer: Self-pay

## 2021-02-21 VITALS — BP 104/60 | HR 104 | Ht 60.0 in | Wt 236.6 lb

## 2021-02-21 DIAGNOSIS — R0602 Shortness of breath: Secondary | ICD-10-CM

## 2021-02-21 DIAGNOSIS — Z9989 Dependence on other enabling machines and devices: Secondary | ICD-10-CM

## 2021-02-21 DIAGNOSIS — G4733 Obstructive sleep apnea (adult) (pediatric): Secondary | ICD-10-CM | POA: Diagnosis not present

## 2021-02-21 NOTE — Telephone Encounter (Signed)
ATC Brad with [employer] to get our office associated with our account.  Left VM to associate [place] so we can get her download.

## 2021-02-21 NOTE — Addendum Note (Signed)
Addended by: Vanessa Barbara on: 02/21/2021 03:27 PM   Modules accepted: Orders

## 2021-02-21 NOTE — Progress Notes (Signed)
Michelle [name]    [id number]    03/13/1946  Primary Care Physician:Wolters, Ivin Booty, MD  Referring Physician: Jonathon Jordan, MD [facility] [address] [place],  [place] [address] Reason for Consultation: shortness of breath Date of Consultation: 02/21/2021  Chief complaint:   Chief Complaint  Patient presents with   Consult    Sob with exertion, fatigue since not wearing CPAP.  Mask keeps falling off.     HPI: Michelle [name] is a 75 y.o. woman who presents with shortness of breath.  She also has hereditary hemochromatosis and gets regular phlebotomy every 2 weeks. In April 2022 she was on vacation in [place] and noticed shortness of breath walking and going up stairs. Symptoms are worsening since then. She has difficulty taking a deep breath in and feels epigastric tenderness with inspiration. Occasional dry cough. She has chest tightness and wheezing.  She has been hospitalized once for her breathing before - had RSV bronchiolitis in Jan 2019  She has OSA and doesn't wear her CPAP because the mask isn't comfortably. She wears a full face mask. She reports poor quality of sleep.   Has been prescribed albuterol in the past but doesn't think it helped. This was back in 2019 when she had RSV.   She denies lower extremity edema. She is also getting an echocardiogram as part of her work up.  Denies fevers chills night sweats or weight loss.   She has also been diagnosed with morphea rash and is due to see dermatology at [facility] for this.   Social history:  Occupation: retired from being a Secretary/administrator Exposures: originally from [place]. No pets at home.  Smoking history: smoked on and of from 1968 to 2016 at her heaviest was less than 1/2 ppd. Had passive smoke exposure in parents and spouse.   Social History   Occupational History   Occupation: Retired Secretary/administrator  Tobacco Use   Smoking status: Former    Packs/day: 0.12    Years: 36.00    Pack years: 4.32     Types: Cigarettes    Quit date: 06/19/2014    Years since quitting: 6.6   Smokeless tobacco: Never  Vaping Use   Vaping Use: Never used  Substance and Sexual Activity   Alcohol use: Yes    Alcohol/week: 21.0 standard drinks    Types: 21 Glasses of wine per week    Comment: 04/24/2017 "3, glasses of white wine qd"   Drug use: No   Sexual activity: Never    Relevant family history:  Family History  Problem Relation Age of Onset   Heart disease Mother        Rheumatic heart disease   Pulmonary embolism Mother    Heart disease Father        valvular heart disease   Stroke Father    Hypertension Sister    Colon cancer Neg Hx    Esophageal cancer Neg Hx    Rectal cancer Neg Hx    Stomach cancer Neg Hx    Pancreatic cancer Neg Hx     Past Medical History:  Diagnosis Date   Acute hypoxemic respiratory failure ([place]) 04/24/2017   Anxiety    Arthritis    "back" (04/24/2017)   Blurry vision, left eye    "YAG on the left; to be repaired" (04/24/2017)   Chondromalacia of knee    right knee   Chronic bronchitis ([place])  Chronic lower back pain    DDD (degenerative disc disease) 07-24-11   Osteoarthritis-s/p LTKA 2'11, now right planned   Depression 07-24-11   tx. depression only   Fatty liver    History of diverticulitis 02/2018   Hx of adenomatous polyp of colon 03/21/2014   Hyperlipidemia    Hypertension    IBS (irritable bowel syndrome)    OSA on CPAP 12/07/2016   Pneumonia 1990s X 1   SOB (shortness of breath)    Vitamin D deficiency     Past Surgical History:  Procedure Laterality Date   APPENDECTOMY  1967   BOTOX INJECTION     "face"   CATARACT EXTRACTION W/ INTRAOCULAR LENS IMPLANT Bilateral 2000s   CESAREAN SECTION  1972; 1975   COLONOSCOPY W/ BIOPSIES AND POLYPECTOMY  "a few"   DILATION AND CURETTAGE OF UTERUS     HYSTEROSCOPY WITH D & C  08/29/2006   and resection of endometrial polyp/notes 09/10/2010   IR IMAGING GUIDED PORT INSERTION  01/18/2021   JOINT  REPLACEMENT     KNEE ARTHROSCOPY Bilateral    NASAL SEPTUM SURGERY  1972   OOPHORECTOMY Right 1994   TOTAL KNEE ARTHROPLASTY  08/05/2011   Procedure: TOTAL KNEE ARTHROPLASTY;  Surgeon: Gearlean Alf, MD;  Location: [facility];  Service: Orthopedics;  Laterality: Right;   TOTAL KNEE ARTHROPLASTY Left 05/2009   TUBAL LIGATION  1975     Physical Exam: Blood pressure 104/60, pulse (!) 104, height 5' (1.524 m), weight 236 lb 9.6 oz (107.3 kg), SpO2 92 %. Gen:      No acute distress, obese ENT:  no nasal polyps, mucus membranes moist, mallampati II Lungs:    port in place right side, No increased respiratory effort, symmetric chest wall excursion, clear to auscultation bilaterally, no wheezes or crackles CV:         Regular rate and rhythm; no murmurs, rubs, or gallops.  No pedal edema Abd:      + bowel sounds; soft, non-tender; no distension MSK: no acute synovitis of DIP or PIP joints, no mechanics hands.  Skin:     warm, dry, erythematous plaques noted on trunk Neuro: normal speech, no focal facial asymmetry Psych: alert and oriented x3, normal mood and affect   Data Reviewed/Medical Decision Making:  Independent interpretation of tests: Imaging:  Review of patient's chest xray Feb 2022 images revealed no acute cardiopulmonary process. The patient's images have been independently reviewed by me.    PFTs: I have personally reviewed the patient's PFTs and normal pulmonary function. PFT Results Latest Ref Rng & Units 06/16/2017  FVC-Pre L 2.22  FVC-Predicted Pre % 84  FVC-Post L 2.13  FVC-Predicted Post % 80  Pre FEV1/FVC % % 82  Post FEV1/FCV % % 86  FEV1-Pre L 1.82  FEV1-Predicted Pre % 91  FEV1-Post L 1.84  DLCO uncorrected ml/min/mmHg 13.83  DLCO UNC% % 68  DLCO corrected ml/min/mmHg 13.83  DLCO COR %Predicted % 68  DLVA Predicted % 96  TLC L 3.76  TLC % Predicted % 81  RV % Predicted % 83    Labs:  Lab Results  Component Value Date   WBC 5.0 02/19/2021   HGB 13.5  02/19/2021   HCT 38.9 02/19/2021   MCV 100.3 (H) 02/19/2021   PLT 188 02/19/2021   Lab Results  Component Value Date   NA 139 02/19/2021   K 4.4 02/19/2021   CL 106 02/19/2021   CO2 26 02/19/2021  Immunization status:  Immunization History  Administered Date(s) Administered   Influenza Split 01/20/2017   PFIZER Comirnaty(Gray Top)Covid-19 Tri-Sucrose Vaccine 07/03/2020   PFIZER(Purple Top)SARS-COV-2 Vaccination 06/17/2019, 07/13/2019   Zoster Recombinat (Shingrix) 01/28/2018     I reviewed prior external note(s) from oncology, PCP  I reviewed the result(s) of the labs and imaging as noted above.   I have ordered PFTs   Assessment:  Shortness of breath - multifactorial Considerations including deconditiong, obesity, smoking related lung disease, untreated OSA.  Plan/Recommendations: Will obtain a full set of PFTs. She had normal PFTs in 2019.  Will give her a flu shot today Will obtain CPAP download and send her for a mask of best fit for her sleep apnea.  Flu shot today  Return to Care: Return in about 2 weeks (around 03/07/2021).  Lenice Llamas, MD Pulmonary and [place]  CC: Jonathon Jordan, MD

## 2021-02-21 NOTE — Patient Instructions (Addendum)
Please schedule follow up scheduled with myself in 2 weeks.  If my schedule is not open yet, we will contact you with a reminder closer to that time.  Before your next visit I would like you to have: Full set of PFTs - takes approx 1 hour  I will reach out to [employer] to have them send you a mask of best fit. Please start wearing your CPAP again.

## 2021-02-26 ENCOUNTER — Ambulatory Visit (HOSPITAL_COMMUNITY)
Admission: RE | Admit: 2021-02-26 | Discharge: 2021-02-26 | Disposition: A | Discharge status: Home / Self Care | Payer: Medicare Other | Source: Ambulatory Visit | Attending: Nurse Practitioner | Admitting: Nurse Practitioner

## 2021-02-26 DIAGNOSIS — E785 Hyperlipidemia, unspecified: Secondary | ICD-10-CM | POA: Insufficient documentation

## 2021-02-26 DIAGNOSIS — R0602 Shortness of breath: Secondary | ICD-10-CM | POA: Insufficient documentation

## 2021-02-26 DIAGNOSIS — I340 Nonrheumatic mitral (valve) insufficiency: Secondary | ICD-10-CM | POA: Insufficient documentation

## 2021-02-26 DIAGNOSIS — R079 Chest pain, unspecified: Secondary | ICD-10-CM | POA: Diagnosis not present

## 2021-02-26 DIAGNOSIS — I10 Essential (primary) hypertension: Secondary | ICD-10-CM | POA: Diagnosis not present

## 2021-02-26 DIAGNOSIS — G4730 Sleep apnea, unspecified: Secondary | ICD-10-CM | POA: Diagnosis not present

## 2021-02-26 LAB — ECHOCARDIOGRAM COMPLETE
AR max vel: 1.72 cm2
AV Area VTI: 1.8 cm2
AV Area mean vel: 1.82 cm2
AV Mean grad: 5 mmHg
AV Peak grad: 9.2 mmHg
Ao pk vel: 1.52 m/s
Area-P 1/2: 3.74 cm2

## 2021-02-26 NOTE — Progress Notes (Signed)
  Echocardiogram 2D Echocardiogram has been performed.  Darlina Sicilian M 02/26/2021, 9:39 AM

## 2021-03-01 ENCOUNTER — Telehealth: Payer: Self-pay | Admitting: Internal Medicine

## 2021-03-01 NOTE — Telephone Encounter (Signed)
I have called the pt and she is aware that the order has been sent to [employer] for the cpap mask and they will contact her once this is processed.  Pt voiced her understanding

## 2021-03-12 ENCOUNTER — Inpatient Hospital Stay: Payer: Medicare Other | Attending: Hematology and Oncology

## 2021-03-12 ENCOUNTER — Other Ambulatory Visit: Payer: Self-pay

## 2021-03-12 ENCOUNTER — Inpatient Hospital Stay: Payer: Medicare Other

## 2021-03-12 DIAGNOSIS — Z95828 Presence of other vascular implants and grafts: Secondary | ICD-10-CM

## 2021-03-12 LAB — COMPREHENSIVE METABOLIC PANEL
ALT: 37 U/L (ref 0–44)
AST: 27 U/L (ref 15–41)
Albumin: 3.9 g/dL (ref 3.5–5.0)
Alkaline Phosphatase: 75 U/L (ref 38–126)
Anion gap: 10 (ref 5–15)
BUN: 21 mg/dL (ref 8–23)
CO2: 23 mmol/L (ref 22–32)
Calcium: 8.8 mg/dL — ABNORMAL LOW (ref 8.9–10.3)
Chloride: 107 mmol/L (ref 98–111)
Creatinine, Ser: 0.99 mg/dL (ref 0.44–1.00)
GFR, Estimated: >60 mL/min (ref >60)
Glucose, Bld: 131 mg/dL — ABNORMAL HIGH (ref 70–99)
Potassium: 4.1 mmol/L (ref 3.5–5.1)
Sodium: 140 mmol/L (ref 135–145)
Total Bilirubin: 0.5 mg/dL (ref 0.3–1.2)
Total Protein: 7.2 g/dL (ref 6.5–8.1)

## 2021-03-12 LAB — CBC (CANCER CENTER ONLY)
HCT: 40.6 % (ref 36.0–46.0)
Hemoglobin: 14.1 g/dL (ref 12.0–15.0)
MCH: 34.5 pg — ABNORMAL HIGH (ref 26.0–34.0)
MCHC: 34.7 g/dL (ref 30.0–36.0)
MCV: 99.3 fL (ref 80.0–100.0)
Platelet Count: 168 10*3/uL (ref 150–400)
RBC: 4.09 MIL/uL (ref 3.87–5.11)
RDW: 12.3 % (ref 11.5–15.5)
WBC Count: 4.7 10*3/uL (ref 4.0–10.5)
nRBC: 0 % (ref 0.0–0.2)

## 2021-03-12 MED ORDER — ALTEPLASE 2 MG IJ SOLR
2.0000 mg | Freq: Once | INTRAMUSCULAR | Status: AC | PRN
  Administered 2021-03-12: 2 mg
  Filled 2021-03-12: qty 2

## 2021-03-12 MED ORDER — HEPARIN SOD (PORK) LOCK FLUSH 100 UNIT/ML IV SOLN
500.0000 [IU] | Freq: Once | INTRAVENOUS | Status: AC
  Administered 2021-03-12: 500 [IU] via INTRAVENOUS

## 2021-03-12 MED ORDER — SODIUM CHLORIDE 0.9% FLUSH
10.0000 mL | INTRAVENOUS | Status: DC | PRN
  Administered 2021-03-12: 10 mL via INTRAVENOUS

## 2021-03-12 NOTE — Progress Notes (Signed)
Michelle [name] presents today for phlebotomy per MD orders. Phlebotomy procedure started at 1435 using pt PAC and ended at 1540. 500 grams removed. Patient observed for 30 minutes after procedure without any incident. Patient tolerated procedure well. PAC flushed and deaccessed.

## 2021-03-12 NOTE — Progress Notes (Signed)
Blood return from port but not enough for lab draw. Labs drawn peripherally. Cathflo administered by Corene Cornea, RN.

## 2021-03-27 ENCOUNTER — Encounter: Payer: Self-pay | Admitting: Internal Medicine

## 2021-03-27 ENCOUNTER — Ambulatory Visit: Payer: Medicare Other | Admitting: Internal Medicine

## 2021-03-27 ENCOUNTER — Ambulatory Visit (INDEPENDENT_AMBULATORY_CARE_PROVIDER_SITE_OTHER): Payer: Medicare Other | Admitting: Internal Medicine

## 2021-03-27 ENCOUNTER — Telehealth: Payer: Self-pay | Admitting: Hematology and Oncology

## 2021-03-27 ENCOUNTER — Other Ambulatory Visit: Payer: Self-pay

## 2021-03-27 VITALS — BP 130/72 | HR 86 | Temp 97.7°F | Ht 61.0 in | Wt 236.6 lb

## 2021-03-27 DIAGNOSIS — R0602 Shortness of breath: Secondary | ICD-10-CM

## 2021-03-27 DIAGNOSIS — G4733 Obstructive sleep apnea (adult) (pediatric): Secondary | ICD-10-CM | POA: Diagnosis not present

## 2021-03-27 DIAGNOSIS — J310 Chronic rhinitis: Secondary | ICD-10-CM

## 2021-03-27 DIAGNOSIS — Z9989 Dependence on other enabling machines and devices: Secondary | ICD-10-CM

## 2021-03-27 LAB — PULMONARY FUNCTION TEST
DL/VA % pred: 85 %
DL/VA: 3.6 ml/min/mmHg/L
DLCO cor % pred: 68 %
DLCO cor: 11.89 ml/min/mmHg
DLCO unc % pred: 69 %
DLCO unc: 12.14 ml/min/mmHg
FEF 25-75 Post: 1.99 L/sec
FEF 25-75 Pre: 1.77 L/sec
FEF2575-%Change-Post: 12 %
FEF2575-%Pred-Post: 125 %
FEF2575-%Pred-Pre: 111 %
FEV1-%Change-Post: 2 %
FEV1-%Pred-Post: 91 %
FEV1-%Pred-Pre: 90 %
FEV1-Post: 1.74 L
FEV1-Pre: 1.71 L
FEV1FVC-%Change-Post: 3 %
FEV1FVC-%Pred-Pre: 108 %
FEV6-%Change-Post: -2 %
FEV6-%Pred-Post: 84 %
FEV6-%Pred-Pre: 87 %
FEV6-Post: 2.04 L
FEV6-Pre: 2.09 L
FEV6FVC-%Change-Post: 0 %
FEV6FVC-%Pred-Post: 105 %
FEV6FVC-%Pred-Pre: 105 %
FVC-%Change-Post: -1 %
FVC-%Pred-Post: 81 %
FVC-%Pred-Pre: 82 %
FVC-Post: 2.06 L
FVC-Pre: 2.09 L
Post FEV1/FVC ratio: 85 %
Post FEV6/FVC ratio: 100 %
Pre FEV1/FVC ratio: 82 %
Pre FEV6/FVC Ratio: 100 %
RV % pred: 46 %
RV: 0.97 L
TLC % pred: 67 %
TLC: 3.11 L

## 2021-03-27 MED ORDER — FLUTICASONE PROPIONATE 50 MCG/ACT NA SUSP: 1.0000 | Freq: Every day | NASAL | 4 refills | Status: DC

## 2021-03-27 NOTE — Patient Instructions (Signed)
Please schedule follow up scheduled with myself in 3 months.  If my schedule is not open yet, we will contact you with a reminder closer to that time. Please call [phone] if you haven't heard from [place] a month before.    Flonase - 1 spray on each side of your nose twice a day for first week, then 1 spray on each side.   Instructions for use: If you also use a saline nasal spray or rinse, use that first. Position the head with the chin slightly tucked. Use the right hand to spray into the left nostril and the right hand to spray into the left nostril.   Point the bottle away from the septum of your nose (cartilage that divides the two sides of your nose).  Hold the nostril closed on the opposite side from where you will spray Spray once and gently sniff to pull the medicine into the higher parts of your nose.  Don't sniff too hard as the medicine will drain down the back of your throat instead. Repeat with a second spray on the same side if prescribed. Repeat on the other side of your nose.

## 2021-03-27 NOTE — Telephone Encounter (Signed)
Rescheduled appointment per provider. Left message. 

## 2021-03-27 NOTE — Progress Notes (Signed)
PFT done today. 

## 2021-03-27 NOTE — Progress Notes (Signed)
Michelle [name]    [id number]    1945/12/07  Primary Care Physician:Wolters, Ivin Booty, MD Date of Appointment: 03/27/2021 Established Patient Visit  Chief complaint:   Chief Complaint  Patient presents with   Follow-up    Pft done today. Dry cough     HPI: Michelle [name] is a 75 y.o. woman with OSA and cough. Additional history of hemochromatosis getting regular phlebotomy.   Interval Updates:  Here for follow up for PFTS which show mild restriction to ventilation likely secondary to body habitus.   New dry cough, occasionally with clear phlegm. Does have rhinorrhea and post nasal drainage. Cough been going on for about a month.   I have reviewed the patient's family social and past medical history and updated as appropriate.   Past Medical History:  Diagnosis Date   Acute hypoxemic respiratory failure ([place]) 04/24/2017   Anxiety    Arthritis    "back" (04/24/2017)   Blurry vision, left eye    "YAG on the left; to be repaired" (04/24/2017)   Chondromalacia of knee    right knee   Chronic bronchitis ([facility])    Chronic lower back pain    DDD (degenerative disc disease) 07-24-11   Osteoarthritis-s/p LTKA 2'11, now right planned   Depression 07-24-11   tx. depression only   Fatty liver    History of diverticulitis 02/2018   Hx of adenomatous polyp of colon 03/21/2014   Hyperlipidemia    Hypertension    IBS (irritable bowel syndrome)    OSA on CPAP 12/07/2016   Pneumonia 1990s X 1   SOB (shortness of breath)    Vitamin D deficiency     Past Surgical History:  Procedure Laterality Date   APPENDECTOMY  1967   BOTOX INJECTION     "face"   CATARACT EXTRACTION W/ INTRAOCULAR LENS IMPLANT Bilateral 2000s   CESAREAN SECTION  1972; 1975   COLONOSCOPY W/ BIOPSIES AND POLYPECTOMY  "a few"   DILATION AND CURETTAGE OF UTERUS     HYSTEROSCOPY WITH D & C  08/29/2006   and resection of endometrial polyp/notes 09/10/2010   IR IMAGING GUIDED PORT INSERTION  01/18/2021   JOINT  REPLACEMENT     KNEE ARTHROSCOPY Bilateral    NASAL SEPTUM SURGERY  1972   OOPHORECTOMY Right 1994   TOTAL KNEE ARTHROPLASTY  08/05/2011   Procedure: TOTAL KNEE ARTHROPLASTY;  Surgeon: Gearlean Alf, MD;  Location: [facility];  Service: Orthopedics;  Laterality: Right;   TOTAL KNEE ARTHROPLASTY Left 05/2009   TUBAL LIGATION  1975    Family History  Problem Relation Age of Onset   Heart disease Mother        Rheumatic heart disease   Pulmonary embolism Mother    Heart disease Father        valvular heart disease   Stroke Father    Hypertension Sister    Colon cancer Neg Hx    Esophageal cancer Neg Hx    Rectal cancer Neg Hx    Stomach cancer Neg Hx    Pancreatic cancer Neg Hx     Social History   Occupational History   Occupation: Retired Secretary/administrator  Tobacco Use   Smoking status: Former    Packs/day: 0.12    Years: 36.00    Pack years: 4.32    Types: Cigarettes    Quit date: 06/19/2014    Years since quitting: 6.7   Smokeless tobacco: Never  Vaping Use   Vaping Use: Never used  Substance and Sexual Activity   Alcohol use: Yes    Alcohol/week: 21.0 standard drinks    Types: 21 Glasses of wine per week    Comment: 04/24/2017 "3, glasses of white wine qd"   Drug use: No   Sexual activity: Never     Physical Exam: Blood pressure 130/72, pulse 86, temperature 97.7 F (36.5 C), height 5\' 1"  (1.549 m), weight 236 lb 9.6 oz (107.3 kg), SpO2 96 %.  Gen:      No acute distress ENT:  mild cobblestoning in oorpharynx, no nasal polyps, mucus membranes moist Lungs:    No increased respiratory effort, symmetric chest wall excursion, clear to auscultation bilaterally, no wheezes or crackles CV:         Regular rate and rhythm; no murmurs, rubs, or gallops.  No pedal edema   Data Reviewed: Imaging:   PFTs:  PFT Results Latest Ref Rng & Units 03/27/2021 06/16/2017  FVC-Pre L 2.09 2.22  FVC-Predicted Pre % 82 84  FVC-Post L 2.06 2.13  FVC-Predicted Post % 81 80  Pre  FEV1/FVC % % 82 82  Post FEV1/FCV % % 85 86  FEV1-Pre L 1.71 1.82  FEV1-Predicted Pre % 90 91  FEV1-Post L 1.74 1.84  DLCO uncorrected ml/min/mmHg 12.14 13.83  DLCO UNC% % 69 68  DLCO corrected ml/min/mmHg 11.89 13.83  DLCO COR %Predicted % 68 68  DLVA Predicted % 85 96  TLC L 3.11 3.76  TLC % Predicted % 67 81  RV % Predicted % 46 83   I have personally reviewed the patient's PFTs and mild restriction to ventilation likely secondary to body habitus.    Labs: Lab Results  Component Value Date   WBC 4.7 03/12/2021   HGB 14.1 03/12/2021   HCT 40.6 03/12/2021   MCV 99.3 03/12/2021   PLT 168 03/12/2021   Lab Results  Component Value Date   NA 140 03/12/2021   K 4.1 03/12/2021   CL 107 03/12/2021   CO2 23 03/12/2021    Immunization status: Immunization History  Administered Date(s) Administered   Influenza Split 01/20/2017   Influenza, Quadrivalent, Recombinant, Inj, Pf 01/24/2020   Influenza-Unspecified 12/23/2019   PFIZER Comirnaty(Gray Top)Covid-19 Tri-Sucrose Vaccine 07/03/2020   PFIZER(Purple Top)SARS-COV-2 Vaccination 06/17/2019, 07/13/2019   Zoster Recombinat (Shingrix) 01/28/2018   PSG reviewed - OSA with apneas suppressed at CPAP 14 cm H20  Assessment:  OSA on CPAP - not well controlled. Chronic cough likely secondary to rhinitis  Plan/Recommendations: Not registered in res med. Does not have SD card. Have sent best fit mask to [employer]. Hopefully they reach out to her soon. She had a resmed air touch machine and given a small full face mask. We will see if we can get her enrolled with [employer] into resmed database.   Start flonase for rhinitis and coughing.    Return to Care: Return in about 3 months (around 06/25/2021).   Lenice Llamas, MD Pulmonary and [place]

## 2021-04-03 ENCOUNTER — Inpatient Hospital Stay: Payer: Medicare Other

## 2021-04-03 ENCOUNTER — Inpatient Hospital Stay: Payer: Medicare Other | Admitting: Hematology and Oncology

## 2021-04-04 DIAGNOSIS — J441 Chronic obstructive pulmonary disease with (acute) exacerbation: Secondary | ICD-10-CM | POA: Diagnosis not present

## 2021-04-04 DIAGNOSIS — G4733 Obstructive sleep apnea (adult) (pediatric): Secondary | ICD-10-CM | POA: Diagnosis not present

## 2021-04-11 ENCOUNTER — Other Ambulatory Visit: Payer: Self-pay

## 2021-04-11 ENCOUNTER — Inpatient Hospital Stay: Payer: Medicare Other

## 2021-04-11 ENCOUNTER — Inpatient Hospital Stay: Payer: Medicare Other | Attending: Hematology and Oncology | Admitting: Hematology and Oncology

## 2021-04-11 ENCOUNTER — Encounter: Payer: Self-pay | Admitting: Hematology and Oncology

## 2021-04-11 DIAGNOSIS — Z95828 Presence of other vascular implants and grafts: Secondary | ICD-10-CM

## 2021-04-11 DIAGNOSIS — D7589 Other specified diseases of blood and blood-forming organs: Secondary | ICD-10-CM

## 2021-04-11 DIAGNOSIS — E785 Hyperlipidemia, unspecified: Secondary | ICD-10-CM | POA: Diagnosis not present

## 2021-04-11 DIAGNOSIS — Z87891 Personal history of nicotine dependence: Secondary | ICD-10-CM | POA: Insufficient documentation

## 2021-04-11 DIAGNOSIS — I10 Essential (primary) hypertension: Secondary | ICD-10-CM | POA: Insufficient documentation

## 2021-04-11 DIAGNOSIS — Z79899 Other long term (current) drug therapy: Secondary | ICD-10-CM | POA: Insufficient documentation

## 2021-04-11 LAB — COMPREHENSIVE METABOLIC PANEL
ALT: 39 U/L (ref 0–44)
AST: 30 U/L (ref 15–41)
Albumin: 4 g/dL (ref 3.5–5.0)
Alkaline Phosphatase: 71 U/L (ref 38–126)
Anion gap: 7 (ref 5–15)
BUN: 23 mg/dL (ref 8–23)
CO2: 26 mmol/L (ref 22–32)
Calcium: 9.1 mg/dL (ref 8.9–10.3)
Chloride: 106 mmol/L (ref 98–111)
Creatinine, Ser: 0.92 mg/dL (ref 0.44–1.00)
GFR, Estimated: >60 mL/min (ref >60)
Glucose, Bld: 84 mg/dL (ref 70–99)
Potassium: 4.2 mmol/L (ref 3.5–5.1)
Sodium: 139 mmol/L (ref 135–145)
Total Bilirubin: 0.6 mg/dL (ref 0.3–1.2)
Total Protein: 6.9 g/dL (ref 6.5–8.1)

## 2021-04-11 LAB — CBC (CANCER CENTER ONLY)
HCT: 38.6 % (ref 36.0–46.0)
Hemoglobin: 13.7 g/dL (ref 12.0–15.0)
MCH: 35 pg — ABNORMAL HIGH (ref 26.0–34.0)
MCHC: 35.5 g/dL (ref 30.0–36.0)
MCV: 98.7 fL (ref 80.0–100.0)
Platelet Count: 170 10*3/uL (ref 150–400)
RBC: 3.91 MIL/uL (ref 3.87–5.11)
RDW: 12 % (ref 11.5–15.5)
WBC Count: 5.7 10*3/uL (ref 4.0–10.5)
nRBC: 0 % (ref 0.0–0.2)

## 2021-04-11 LAB — FERRITIN: Ferritin: 100 ng/mL (ref 11–307)

## 2021-04-11 MED ORDER — SODIUM CHLORIDE 0.9% FLUSH
10.0000 mL | INTRAVENOUS | Status: AC | PRN
  Administered 2021-04-11: 14:00:00 10 mL

## 2021-04-11 NOTE — Progress Notes (Signed)
[place]   [phone] Fax:([phone]   Clinic Follow up Note   Patient Care Team: Jonathon Jordan, Michelle [name] as PCP - General (Family Medicine) End, Harrell Gave, Michelle [name] as PCP - Cardiology (Cardiology) Gaynelle Arabian, Michelle [name] (Orthopedic Surgery) Date of Service: 02/19/21   CHIEF COMPLAINT: Follow up hereditary hemochromatosis   CURRENT THERAPY: therapeutic phlebotomy if HCT >36 and to maintain ferritin < 500, recently done q2 weeks starting 01/09/21  INTERVAL HISTORY: Michelle [name] returns for follow up and phlebotomy as scheduled.   Michelle [name] is here for follow-up by herself.  Since her last visit, her chest pain has completely resolved.  She denies any new complaints today.  Her skin rash has gotten better since she has been able to do phlebotomies regularly.  She has been doing phlebotomy on an average every 3 weeks.  No change in breathing.  No change in bowel habits or urinary habits. Rest of the pertinent 10 point ROS reviewed and negative.  MEDICAL HISTORY:  Past Medical History:  Diagnosis Date   Acute hypoxemic respiratory failure ([place]) 04/24/2017   Anxiety    Arthritis    "back" (04/24/2017)   Blurry vision, left eye    "YAG on the left; to be repaired" (04/24/2017)   Chondromalacia of knee    right knee   Chronic bronchitis ([facility])    Chronic lower back pain    DDD (degenerative disc disease) 07-24-11   Osteoarthritis-s/p LTKA 2'11, now right planned   Depression 07-24-11   tx. depression only   Fatty liver    History of diverticulitis 02/2018   Hx of adenomatous polyp of colon 03/21/2014   Hyperlipidemia    Hypertension    IBS (irritable bowel syndrome)    OSA on CPAP 12/07/2016   Pneumonia 1990s X 1   SOB (shortness of breath)    Vitamin D deficiency     SURGICAL HISTORY: Past Surgical History:  Procedure Laterality Date   APPENDECTOMY  1967   BOTOX INJECTION     "face"   CATARACT EXTRACTION W/ INTRAOCULAR LENS IMPLANT Bilateral 2000s    CESAREAN SECTION  1972; 1975   COLONOSCOPY W/ BIOPSIES AND POLYPECTOMY  "a few"   DILATION AND CURETTAGE OF UTERUS     HYSTEROSCOPY WITH D & C  08/29/2006   and resection of endometrial polyp/notes 09/10/2010   IR IMAGING GUIDED PORT INSERTION  01/18/2021   JOINT REPLACEMENT     KNEE ARTHROSCOPY Bilateral    NASAL SEPTUM SURGERY  1972   OOPHORECTOMY Right 1994   TOTAL KNEE ARTHROPLASTY  08/05/2011   Procedure: TOTAL KNEE ARTHROPLASTY;  Surgeon: Gearlean Alf, Michelle [name];  Location: [facility];  Service: Orthopedics;  Laterality: Right;   TOTAL KNEE ARTHROPLASTY Left 05/2009   TUBAL LIGATION  1975    I have reviewed the social history and family history with the patient and they are unchanged from previous note.  ALLERGIES:  has No Known Allergies.  MEDICATIONS:  Current Outpatient Medications  Medication Sig Dispense Refill   fluticasone (FLONASE) 50 MCG/ACT nasal spray Place 1 spray into both nostrils daily. 16 g 4   losartan (COZAAR) 50 MG tablet Take 50 mg by mouth daily.     sertraline (ZOLOFT) 100 MG tablet Take 100 mg by mouth 2 (two) times a day.   0   No current facility-administered medications for this visit.    PHYSICAL EXAMINATION:  Vitals:   04/11/21 1442  BP: (!) 147/87  Pulse: 79  Resp:  18  Temp: (!) 97.4 F (36.3 C)  SpO2: 97%   O2 sat 94-96% on room air on ambulation, pulse did not exceed 100 bpm Filed Weights   04/11/21 1442  Weight: 238 lb 8 oz (108.2 kg)    GENERAL:alert, no distress and comfortable SKIN: No obvious rash to exposed skin EYES: sclera clear NECK: Without mass or swelling LUNGS: clear to auscultation and percussion with normal breathing effort at rest, increased work of breathing on exertion HEART: regular rate & rhythm, no lower extremity edema ABDOMEN:abdomen soft, non-tender and normal bowel sounds Musculoskeletal: Focal tenderness at the left upper anterior chest NEURO: alert & oriented x 3 with fluent speech, no focal motor/sensory  deficits PAC without erythema  LABORATORY DATA:  I have reviewed the data as listed CBC Latest Ref Rng & Units 04/11/2021 03/12/2021 02/19/2021  WBC 4.0 - 10.5 K/uL 5.7 4.7 5.0  Hemoglobin 12.0 - 15.0 g/dL 13.7 14.1 13.5  Hematocrit 36.0 - 46.0 % 38.6 40.6 38.9  Platelets 150 - 400 K/uL 170 168 188     CMP Latest Ref Rng & Units 04/11/2021 03/12/2021 02/19/2021  Glucose 70 - 99 mg/dL 84 131(H) 121(H)  BUN 8 - 23 mg/dL 23 21 20   Creatinine 0.44 - 1.00 mg/dL 0.92 0.99 0.84  Sodium 135 - 145 mmol/L 139 140 139  Potassium 3.5 - 5.1 mmol/L 4.2 4.1 4.4  Chloride 98 - 111 mmol/L 106 107 106  CO2 22 - 32 mmol/L 26 23 26   Calcium 8.9 - 10.3 mg/dL 9.1 8.8(L) 8.8(L)  Total Protein 6.5 - 8.1 g/dL 6.9 7.2 7.1  Total Bilirubin 0.3 - 1.2 mg/dL 0.6 0.5 0.5  Alkaline Phos 38 - 126 U/L 71 75 72  AST 15 - 41 U/L 30 27 30   ALT 0 - 44 U/L 39 37 43      RADIOGRAPHIC STUDIES: I have personally reviewed the radiological images as listed and agreed with the findings in the report. No results found.   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  We have also repeated ferritin today.  If her ferritin is between 50 and 100, we can schedule phlebotomy every 3 to 4 months at that point.  Her chest pain has completely resolved since her last visit.  With regards to obesity, again this is well controlled since she has been able to do phlebotomies regularly. Return to clinic with me in 3 months.  Michelle [name], Michelle [name] 04/11/21

## 2021-04-11 NOTE — Progress Notes (Signed)
[place] NOTE  Patient Care Team: Jonathon Jordan, MD as PCP - General (Family Medicine) End, Harrell Gave, MD as PCP - Cardiology (Cardiology) Gaynelle Arabian, MD (Orthopedic Surgery)  CHIEF COMPLAINTS/PURPOSE OF CONSULTATION:  Follow up for hemochromatosis and phlebotomy needs.  ASSESSMENT & PLAN:   No problem-specific Assessment & Plan notes found for this encounter.  No orders of the defined types were placed in this encounter.    HISTORY OF PRESENTING ILLNESS:   Michelle [name] 75 y.o. female is here because of macrocytosis.  This is a very pleasant 75 year old female patient with past medical history significant for hypertension, dyslipidemia, chronic lower back pain referred to hematology for evaluation of macrocytosis and hemochromatosis.  Interim History  Last therapeutic phlebotomy MEDICAL HISTORY:  Past Medical History:  Diagnosis Date   Acute hypoxemic respiratory failure ([place]) 04/24/2017   Anxiety    Arthritis    "back" (04/24/2017)   Blurry vision, left eye    "YAG on the left; to be repaired" (04/24/2017)   Chondromalacia of knee    right knee   Chronic bronchitis (HCC)    Chronic lower back pain    DDD (degenerative disc disease) 07-24-11   Osteoarthritis-s/p LTKA 2'11, now right planned   Depression 07-24-11   tx. depression only   Fatty liver    History of diverticulitis 02/2018   Hx of adenomatous polyp of colon 03/21/2014   Hyperlipidemia    Hypertension    IBS (irritable bowel syndrome)    OSA on CPAP 12/07/2016   Pneumonia 1990s X 1   SOB (shortness of breath)    Vitamin D deficiency     SURGICAL HISTORY: Past Surgical History:  Procedure Laterality Date   APPENDECTOMY  1967   BOTOX INJECTION     "face"   CATARACT EXTRACTION W/ INTRAOCULAR LENS IMPLANT Bilateral 2000s   CESAREAN SECTION  1972; 1975   COLONOSCOPY W/ BIOPSIES AND POLYPECTOMY  "a few"   DILATION AND CURETTAGE OF UTERUS      HYSTEROSCOPY WITH D & C  08/29/2006   and resection of endometrial polyp/notes 09/10/2010   IR IMAGING GUIDED PORT INSERTION  01/18/2021   JOINT REPLACEMENT     KNEE ARTHROSCOPY Bilateral    NASAL SEPTUM SURGERY  1972   OOPHORECTOMY Right 1994   TOTAL KNEE ARTHROPLASTY  08/05/2011   Procedure: TOTAL KNEE ARTHROPLASTY;  Surgeon: Gearlean Alf, MD;  Location: [facility];  Service: Orthopedics;  Laterality: Right;   TOTAL KNEE ARTHROPLASTY Left 05/2009   TUBAL LIGATION  1975    SOCIAL HISTORY: Social History   Socioeconomic History   Marital status: Divorced    Spouse name: Not on file   Number of children: 2   Years of education: Not on file   Highest education level: Not on file  Occupational History   Occupation: Retired Secretary/administrator  Tobacco Use   Smoking status: Former    Packs/day: 0.12    Years: 36.00    Pack years: 4.32    Types: Cigarettes    Quit date: 06/19/2014    Years since quitting: 6.8   Smokeless tobacco: Never  Vaping Use   Vaping Use: Never used  Substance and Sexual Activity   Alcohol use: Yes    Alcohol/week: 21.0 standard drinks    Types: 21 Glasses of wine per week    Comment: 04/24/2017 "3, glasses of white wine qd"   Drug use: No   Sexual activity: Never  Other Topics  Concern   Not on file  Social History Narrative   Not on file   Social Determinants of Health   Financial Resource Strain: Not on file  Food Insecurity: Not on file  Transportation Needs: Not on file  Physical Activity: Not on file  Stress: Not on file  Social Connections: Not on file  Intimate Partner Violence: Not on file    FAMILY HISTORY: Family History  Problem Relation Age of Onset   Heart disease Mother        Rheumatic heart disease   Pulmonary embolism Mother    Heart disease Father        valvular heart disease   Stroke Father    Hypertension Sister    Colon cancer Neg Hx    Esophageal cancer Neg Hx    Rectal cancer Neg Hx     Stomach cancer Neg Hx    Pancreatic cancer Neg Hx     ALLERGIES:  has No Known Allergies.  MEDICATIONS:  Current Outpatient Medications  Medication Sig Dispense Refill   fluticasone (FLONASE) 50 MCG/ACT nasal spray Place 1 spray into both nostrils daily. 16 g 4   losartan (COZAAR) 50 MG tablet Take 50 mg by mouth daily.     sertraline (ZOLOFT) 100 MG tablet Take 100 mg by mouth 2 (two) times a day.   0   No current facility-administered medications for this visit.   PHYSICAL EXAMINATION:  ECOG PERFORMANCE STATUS: 0 - Asymptomatic  There were no vitals filed for this visit.  There were no vitals filed for this visit.  Physical Exam Skin:    Findings: Rash (Macular rash noted on the trunk) present.     LABORATORY DATA:  I have reviewed the data as listed Lab Results  Component Value Date   WBC 4.7 03/12/2021   HGB 14.1 03/12/2021   HCT 40.6 03/12/2021   MCV 99.3 03/12/2021   PLT 168 03/12/2021     Chemistry      Component Value Date/Time   NA 140 03/12/2021 1324   NA 139 02/18/2018 1217   K 4.1 03/12/2021 1324   CL 107 03/12/2021 1324   CO2 23 03/12/2021 1324   BUN 21 03/12/2021 1324   BUN 30 (H) 02/18/2018 1217   CREATININE 0.99 03/12/2021 1324   CREATININE 0.98 10/03/2020 1301      Component Value Date/Time   CALCIUM 8.8 (L) 03/12/2021 1324   ALKPHOS 75 03/12/2021 1324   AST 27 03/12/2021 1324   AST 28 10/03/2020 1301   ALT 37 03/12/2021 1324   ALT 49 (H) 10/03/2020 1301   BILITOT 0.5 03/12/2021 1324   BILITOT 0.5 10/03/2020 1301     CBC from today showed white count of 5.1, hemoglobin of 14.9, hematocrit of 42 and platelet count 172,000 CMP is normal Iron panel and ferritin are pending from today.  Last ferritin in June was 500  RADIOGRAPHIC STUDIES: I have personally reviewed the radiological images as listed and agreed with the findings in the report. No results found.  All questions were answered. The patient knows to call the clinic with  any problems, questions or concerns.  I spent 30 minutes in the care of this patient including history, review of records, counseling and coordination of care. We have called Dr. Rolinda Roan office to obtain records regarding the recent diagnosis of porphyria.  Have discussed that we need to continue phlebotomy more aggressively to bring down the ferritin faster to help with phlebotomy, she was instructed  to avoid sun exposure, minimize alcohol intake and other hepatotoxins.  I agree with port placement for further management since she is very hard access to proceed with regular phlebotomies.   Benay Pike, MD 04/11/2021 12:37 PM

## 2021-04-11 NOTE — Progress Notes (Signed)
Patient here for therapeutic phlebotomy.  Phlebotomy procedure started at 1548 and ended at 163 using PAC. 185 grams removed. Dr. Chryl Heck aware, D/C'd procedure. Patient declined to stay for 30 minute post observation period.  Patient tolerated procedure well, VSS.

## 2021-04-11 NOTE — Progress Notes (Signed)
Per Dr. Chryl Heck - patient is to proceed with phlebotomy today.

## 2021-04-12 ENCOUNTER — Telehealth: Payer: Self-pay

## 2021-04-12 NOTE — Telephone Encounter (Signed)
Pt advised with verbal understanding.  She will call back to schedule her appt.

## 2021-04-12 NOTE — Telephone Encounter (Signed)
-----   Message from Benay Pike, MD sent at 04/12/2021 11:48 AM EST ----- Bonnita Nasuti  Can you please let her know that her ferritin today is 100. So I would recommend one more phlebotomy in 3 weeks and then no more until her follow up.  Thanks,

## 2021-05-08 DIAGNOSIS — R059 Cough, unspecified: Secondary | ICD-10-CM | POA: Diagnosis not present

## 2021-05-08 DIAGNOSIS — J019 Acute sinusitis, unspecified: Secondary | ICD-10-CM | POA: Diagnosis not present

## 2021-05-08 DIAGNOSIS — R0981 Nasal congestion: Secondary | ICD-10-CM | POA: Diagnosis not present

## 2021-05-09 ENCOUNTER — Telehealth: Payer: Self-pay | Admitting: Hematology and Oncology

## 2021-05-09 ENCOUNTER — Inpatient Hospital Stay: Payer: Medicare Other

## 2021-05-09 DIAGNOSIS — R059 Cough, unspecified: Secondary | ICD-10-CM | POA: Diagnosis not present

## 2021-05-09 DIAGNOSIS — Z03818 Encounter for observation for suspected exposure to other biological agents ruled out: Secondary | ICD-10-CM | POA: Diagnosis not present

## 2021-05-09 NOTE — Telephone Encounter (Signed)
R/s per 1/18 inbasket, unable to leave msg, mailed calendar

## 2021-05-16 ENCOUNTER — Other Ambulatory Visit: Payer: Self-pay

## 2021-05-16 ENCOUNTER — Inpatient Hospital Stay: Payer: Medicare Other | Attending: Hematology and Oncology

## 2021-05-16 NOTE — Progress Notes (Signed)
Port accessed via [address] @ 1550 & 267 ml blood obtained with syringes.  Frequent flushes due to blood clotting.  Pt placed in different positions to try to get flow but very slow & difficult.  Will inform MD of amt obtained. Pt encouraged to eat & drink more fluids.  Pt tol well. Port flushed with saline x 2 & 500 units Heparin per protocol.

## 2021-05-16 NOTE — Patient Instructions (Signed)

## 2021-05-23 ENCOUNTER — Telehealth: Payer: Self-pay | Admitting: Hematology and Oncology

## 2021-05-23 NOTE — Telephone Encounter (Signed)
Sch per 1/25 inbasket, pt aware °

## 2021-05-24 DIAGNOSIS — Z6841 Body Mass Index (BMI) 40.0 and over, adult: Secondary | ICD-10-CM | POA: Diagnosis not present

## 2021-05-24 DIAGNOSIS — Z01419 Encounter for gynecological examination (general) (routine) without abnormal findings: Secondary | ICD-10-CM | POA: Diagnosis not present

## 2021-05-24 DIAGNOSIS — Z1231 Encounter for screening mammogram for malignant neoplasm of breast: Secondary | ICD-10-CM | POA: Diagnosis not present

## 2021-06-25 ENCOUNTER — Inpatient Hospital Stay: Payer: Medicare Other | Attending: Hematology and Oncology

## 2021-06-25 ENCOUNTER — Other Ambulatory Visit: Payer: Self-pay

## 2021-06-25 DIAGNOSIS — D7589 Other specified diseases of blood and blood-forming organs: Secondary | ICD-10-CM

## 2021-06-25 DIAGNOSIS — Z95828 Presence of other vascular implants and grafts: Secondary | ICD-10-CM

## 2021-06-25 LAB — COMPREHENSIVE METABOLIC PANEL
ALT: 31 U/L (ref 0–44)
AST: 25 U/L (ref 15–41)
Albumin: 4 g/dL (ref 3.5–5.0)
Alkaline Phosphatase: 69 U/L (ref 38–126)
Anion gap: 8 (ref 5–15)
BUN: 21 mg/dL (ref 8–23)
CO2: 25 mmol/L (ref 22–32)
Calcium: 8.8 mg/dL — ABNORMAL LOW (ref 8.9–10.3)
Chloride: 105 mmol/L (ref 98–111)
Creatinine, Ser: 0.89 mg/dL (ref 0.44–1.00)
GFR, Estimated: >60 mL/min (ref >60)
Glucose, Bld: 125 mg/dL — ABNORMAL HIGH (ref 70–99)
Potassium: 4.3 mmol/L (ref 3.5–5.1)
Sodium: 138 mmol/L (ref 135–145)
Total Bilirubin: 0.6 mg/dL (ref 0.3–1.2)
Total Protein: 7.1 g/dL (ref 6.5–8.1)

## 2021-06-25 LAB — CBC (CANCER CENTER ONLY)
HCT: 41 % (ref 36.0–46.0)
Hemoglobin: 14.2 g/dL (ref 12.0–15.0)
MCH: 34.1 pg — ABNORMAL HIGH (ref 26.0–34.0)
MCHC: 34.6 g/dL (ref 30.0–36.0)
MCV: 98.6 fL (ref 80.0–100.0)
Platelet Count: 177 10*3/uL (ref 150–400)
RBC: 4.16 MIL/uL (ref 3.87–5.11)
RDW: 12.2 % (ref 11.5–15.5)
WBC Count: 5.2 10*3/uL (ref 4.0–10.5)
nRBC: 0 % (ref 0.0–0.2)

## 2021-06-25 LAB — FERRITIN: Ferritin: 121 ng/mL (ref 11–307)

## 2021-06-25 MED ORDER — HEPARIN SOD (PORK) LOCK FLUSH 100 UNIT/ML IV SOLN
500.0000 [IU] | INTRAVENOUS | Status: AC | PRN
  Administered 2021-06-25: 500 [IU]

## 2021-06-25 MED ORDER — SODIUM CHLORIDE 0.9% FLUSH
10.0000 mL | INTRAVENOUS | Status: AC | PRN
  Administered 2021-06-25: 10 mL

## 2021-07-17 ENCOUNTER — Encounter: Payer: Self-pay | Admitting: Hematology and Oncology

## 2021-07-17 ENCOUNTER — Other Ambulatory Visit: Payer: Self-pay

## 2021-07-17 ENCOUNTER — Inpatient Hospital Stay: Payer: Medicare Other | Admitting: Hematology and Oncology

## 2021-07-17 ENCOUNTER — Other Ambulatory Visit: Payer: Medicare Other

## 2021-07-17 NOTE — Progress Notes (Signed)
?[place]   ?Telephone:([phone] [phone]   ?Clinic Follow up Note  ? ?Patient Care Team: ?Jonathon Jordan, MD as PCP - General (Family Medicine) ?Nelva Bush, MD as PCP - Cardiology (Cardiology) ?Gaynelle Arabian, MD (Orthopedic Surgery) ?Date of Service: 02/19/21  ? ?CHIEF COMPLAINT: Follow up hereditary hemochromatosis  ? ?CURRENT THERAPY: therapeutic phlebotomy if HCT >36 and to maintain ferritin < 500, recently done q2 weeks starting 01/09/21 ? ?INTERVAL HISTORY: Ms. Michelle [name] returns for follow up and phlebotomy as scheduled.  ? ?Ms. Michelle [name] is here for follow-up by herself.   ?Since last visit, she has been dealing with the skin rash. She has a follow up with derm in a week. ?She says she is discouraged that she cant lose weight. She hasnt started exercising yet. ?She has been compliant with CPAP. ?No change in breathing/bowel habits/urinary habits ?Rest of the pertinent 10 point ROS reviewed and negative. ? ?MEDICAL HISTORY:  ?Past Medical History:  ?Diagnosis Date  ? Acute hypoxemic respiratory failure ([place]) 04/24/2017  ? Anxiety   ? Arthritis   ? "back" (04/24/2017)  ? Blurry vision, left eye   ? "YAG on the left; to be repaired" (04/24/2017)  ? Chondromalacia of knee   ? right knee  ? Chronic bronchitis ([place])   ? Chronic lower back pain   ? DDD (degenerative disc disease) 07-24-11  ? 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 Depression 07-24-11  ? tx. depression only  ? Fatty liver   ? History of diverticulitis 02/2018  ? Hx of adenomatous polyp of colon 03/21/2014  ? Hyperlipidemia   ? Hypertension   ? IBS (irritable bowel syndrome)   ? OSA on CPAP 12/07/2016  ? Pneumonia 1990s X 1  ? SOB (shortness of breath)   ? Vitamin D deficiency   ? ? ?SURGICAL HISTORY: ?Past Surgical History:  ?Procedure Laterality Date  ? APPENDECTOMY  1967  ? BOTOX INJECTION    ? "face"  ? CATARACT EXTRACTION W/ INTRAOCULAR LENS IMPLANT Bilateral 2000s  ? [place]; 1975  ? COLONOSCOPY  W/ BIOPSIES AND POLYPECTOMY  "a few"  ? DILATION AND CURETTAGE OF UTERUS    ? HYSTEROSCOPY WITH D & C  08/29/2006  ? and resection of endometrial polyp/notes 09/10/2010  ? IR IMAGING GUIDED PORT INSERTION  01/18/2021  ? JOINT REPLACEMENT    ? KNEE ARTHROSCOPY Bilateral   ? NASAL SEPTUM SURGERY  1972  ? OOPHORECTOMY Right 1994  ? TOTAL KNEE ARTHROPLASTY  08/05/2011  ? Procedure: TOTAL KNEE ARTHROPLASTY;  Surgeon: Gearlean Alf, MD;  Location: [facility];  Service: Orthopedics;  Laterality: Right;  ? TOTAL KNEE ARTHROPLASTY Left 05/2009  ? TUBAL LIGATION  1975  ? ? ?I have reviewed the social history and family history with the patient and they are unchanged from previous note. ? ?ALLERGIES:  has No Known Allergies. ? ?MEDICATIONS:  ?Current Outpatient Medications  ?Medication Sig Dispense Refill  ? fluticasone (FLONASE) 50 MCG/ACT nasal spray Place 1 spray into both nostrils daily. 16 g 4  ? losartan (COZAAR) 50 MG tablet Take 50 mg by mouth daily.    ? sertraline (ZOLOFT) 100 MG tablet Take 100 mg by mouth 2 (two) times a day.   0  ? ?No current facility-administered medications for this visit.  ? ? ?PHYSICAL EXAMINATION: ? ?Vitals:  ? 07/17/21 1425  ?BP: (!) 148/78  ?Pulse: 72  ?Resp: 18  ?Temp: 97.7 ?F (36.5 ?C)  ?SpO2: 96%  ? ?  O2 sat 94-96% on room air on ambulation, pulse did not exceed 100 bpm ?Filed Weights  ? 07/17/21 1425  ?Weight: 240 lb 3.2 oz (109 kg)  ? ? ?Physical Exam ?Constitutional:   ?   Appearance: Normal appearance.  ?Cardiovascular:  ?   Rate and Rhythm: Normal rate and regular rhythm.  ?   Pulses: Normal pulses.  ?   Heart sounds: Normal heart sounds.  ?Pulmonary:  ?   Effort: Pulmonary effort is normal.  ?   Breath sounds: Normal breath sounds.  ?Abdominal:  ?   General: Abdomen is flat. There is no distension.  ?   Palpations: Abdomen is soft. There is no mass.  ?   Tenderness: There is no abdominal tenderness.  ?Musculoskeletal:  ?   Cervical back: Normal range of motion and neck supple. No  rigidity.  ?Lymphadenopathy:  ?   Cervical: No cervical adenopathy.  ?Skin: ?   Findings: Rash present.  ?Neurological:  ?   General: No focal deficit present.  ?   Mental Status: She is alert.  ? ? ? ?LABORATORY DATA:  ?I have reviewed the data as listed ? ?  Latest Ref Rng & Units 06/25/2021  ?  1:46 PM 04/11/2021  ?  2:17 PM 03/12/2021  ?  1:24 PM  ?CBC  ?WBC 4.0 - 10.5 K/uL 5.2   5.7   4.7    ?Hemoglobin 12.0 - 15.0 g/dL 14.2   13.7   14.1    ?Hematocrit 36.0 - 46.0 % 41.0   38.6   40.6    ?Platelets 150 - 400 K/uL 177   170   168    ? ? ? ? ?  Latest Ref Rng & Units 06/25/2021  ?  1:46 PM 04/11/2021  ?  2:17 PM 03/12/2021  ?  1:24 PM  ?CMP  ?Glucose 70 - 99 mg/dL 125   84   131    ?BUN 8 - 23 mg/dL '21   23   21    '$ ?Creatinine 0.44 - 1.00 mg/dL 0.89   0.92   0.99    ?Sodium 135 - 145 mmol/L 138   139   140    ?Potassium 3.5 - 5.1 mmol/L 4.3   4.2   4.1    ?Chloride 98 - 111 mmol/L 105   106   107    ?CO2 22 - 32 mmol/L '25   26   23    '$ ?Calcium 8.9 - 10.3 mg/dL 8.8   9.1   8.8    ?Total Protein 6.5 - 8.1 g/dL 7.1   6.9   7.2    ?Total Bilirubin 0.3 - 1.2 mg/dL 0.6   0.6   0.5    ?Alkaline Phos 38 - 126 U/L 69   71   75    ?AST 15 - 41 U/L '25   30   27    '$ ?ALT 0 - 44 U/L 31   39   37    ? ? ? ? ?RADIOGRAPHIC STUDIES: ?I have personally reviewed the radiological images as listed and agreed with the findings in the report. ?No results found.  ? ?ASSESSMENT & PLAN: 76 year old female ? 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 ?Last labs in early March with ferritin of 121 and hemoglobin of 14.1, okay to proceed with phlebotomy session next  week.  Given her PCP we are trying to keep her ferritin between 50 and 100. ?Obesity, once again discussed about role of diet and exercise.  I told her to start small, 20 minutes of exercise daily and escalate as  tolerated.  She probably will benefit from doing it in a group activity since this will motivate her and also will help with accountability. ?Skin rash, she is very worried about the skin rash, she was previously told it is related to morphea versus PCT.  She has a dermatology appointment next week, encouraged her to keep this and discuss with her dermatologist. ?Age-appropriate cancer screening discussed. ? ?Return to clinic with me in 4 months. ? Benay Pike, MD ?07/17/21  ?  ?

## 2021-07-23 DIAGNOSIS — D0439 Carcinoma in situ of skin of other parts of face: Secondary | ICD-10-CM | POA: Diagnosis not present

## 2021-07-23 DIAGNOSIS — L940 Localized scleroderma [morphea]: Secondary | ICD-10-CM | POA: Diagnosis not present

## 2021-07-24 ENCOUNTER — Other Ambulatory Visit: Payer: Medicare Other

## 2021-07-24 DIAGNOSIS — I10 Essential (primary) hypertension: Secondary | ICD-10-CM | POA: Diagnosis not present

## 2021-07-24 DIAGNOSIS — F32A Depression, unspecified: Secondary | ICD-10-CM | POA: Diagnosis not present

## 2021-07-24 DIAGNOSIS — F419 Anxiety disorder, unspecified: Secondary | ICD-10-CM | POA: Diagnosis not present

## 2021-07-24 DIAGNOSIS — Z87891 Personal history of nicotine dependence: Secondary | ICD-10-CM | POA: Diagnosis not present

## 2021-07-24 DIAGNOSIS — D7589 Other specified diseases of blood and blood-forming organs: Secondary | ICD-10-CM | POA: Diagnosis not present

## 2021-07-24 DIAGNOSIS — K589 Irritable bowel syndrome without diarrhea: Secondary | ICD-10-CM | POA: Diagnosis not present

## 2021-07-24 DIAGNOSIS — G4733 Obstructive sleep apnea (adult) (pediatric): Secondary | ICD-10-CM | POA: Diagnosis not present

## 2021-07-24 DIAGNOSIS — E785 Hyperlipidemia, unspecified: Secondary | ICD-10-CM | POA: Diagnosis not present

## 2021-07-24 DIAGNOSIS — Z79899 Other long term (current) drug therapy: Secondary | ICD-10-CM | POA: Diagnosis not present

## 2021-07-27 ENCOUNTER — Inpatient Hospital Stay: Payer: Medicare Other | Attending: Hematology and Oncology

## 2021-07-27 ENCOUNTER — Inpatient Hospital Stay: Payer: Medicare Other

## 2021-07-27 ENCOUNTER — Other Ambulatory Visit: Payer: Self-pay

## 2021-07-27 DIAGNOSIS — D7589 Other specified diseases of blood and blood-forming organs: Secondary | ICD-10-CM

## 2021-07-27 DIAGNOSIS — Z95828 Presence of other vascular implants and grafts: Secondary | ICD-10-CM

## 2021-07-27 LAB — CBC (CANCER CENTER ONLY)
HCT: 40.1 % (ref 36.0–46.0)
Hemoglobin: 13.9 g/dL (ref 12.0–15.0)
MCH: 33.8 pg (ref 26.0–34.0)
MCHC: 34.7 g/dL (ref 30.0–36.0)
MCV: 97.6 fL (ref 80.0–100.0)
Platelet Count: 196 10*3/uL (ref 150–400)
RBC: 4.11 MIL/uL (ref 3.87–5.11)
RDW: 12.1 % (ref 11.5–15.5)
WBC Count: 5.4 10*3/uL (ref 4.0–10.5)
nRBC: 0 % (ref 0.0–0.2)

## 2021-07-27 LAB — COMPREHENSIVE METABOLIC PANEL
ALT: 38 U/L (ref 0–44)
AST: 27 U/L (ref 15–41)
Albumin: 4 g/dL (ref 3.5–5.0)
Alkaline Phosphatase: 80 U/L (ref 38–126)
Anion gap: 6 (ref 5–15)
BUN: 21 mg/dL (ref 8–23)
CO2: 25 mmol/L (ref 22–32)
Calcium: 8.9 mg/dL (ref 8.9–10.3)
Chloride: 107 mmol/L (ref 98–111)
Creatinine, Ser: 0.99 mg/dL (ref 0.44–1.00)
GFR, Estimated: 60 mL/min — ABNORMAL LOW (ref >60)
Glucose, Bld: 111 mg/dL — ABNORMAL HIGH (ref 70–99)
Potassium: 4.3 mmol/L (ref 3.5–5.1)
Sodium: 138 mmol/L (ref 135–145)
Total Bilirubin: 0.5 mg/dL (ref 0.3–1.2)
Total Protein: 7.2 g/dL (ref 6.5–8.1)

## 2021-07-27 MED ORDER — SODIUM CHLORIDE 0.9% FLUSH
10.0000 mL | INTRAVENOUS | Status: AC | PRN
  Administered 2021-07-27: 10 mL

## 2021-07-27 MED ORDER — SODIUM CHLORIDE 0.9% FLUSH
10.0000 mL | Freq: Once | INTRAVENOUS | Status: AC
  Administered 2021-07-27: 10 mL via INTRAVENOUS

## 2021-07-27 MED ORDER — HEPARIN SOD (PORK) LOCK FLUSH 100 UNIT/ML IV SOLN
500.0000 [IU] | Freq: Once | INTRAVENOUS | Status: AC
  Administered 2021-07-27: 500 [IU] via INTRAVENOUS

## 2021-07-27 NOTE — Patient Instructions (Signed)

## 2021-07-27 NOTE — Progress Notes (Signed)
Patient came in today for a phlebotomy. Dr. Chryl Heck ordered for procedure if HCT > 36, patient's HCT was 40.1. 19g port needle was used to access in port flush and 25m/g were removed from 1510 to 1550 before the port stopping drawing blood well. Dr. IChryl Heckwas informed patient decided that was enough. Patient stayed for 30 minute observation- drank fluids and food. Patient tolerated well. VSS- BP 130/81 (BP Location: Left Arm, Patient Position: Sitting)   Pulse 75   Temp 98 ?F (36.7 ?C) (Oral)   Resp 18   SpO2 98%  ?Port de-accessed/heparinized.  ? ?

## 2021-07-30 ENCOUNTER — Encounter: Payer: Self-pay | Admitting: Hematology and Oncology

## 2021-07-30 LAB — FERRITIN: Ferritin: 133 ng/mL (ref 11–307)

## 2021-08-01 ENCOUNTER — Telehealth: Payer: Self-pay | Admitting: Hematology and Oncology

## 2021-08-01 NOTE — Telephone Encounter (Signed)
.  Called patient to schedule appointment per 4/11 inbasket, patient is aware of date and time.   ?

## 2021-08-27 ENCOUNTER — Inpatient Hospital Stay: Payer: Medicare Other | Attending: Hematology and Oncology

## 2021-08-27 ENCOUNTER — Other Ambulatory Visit: Payer: Self-pay

## 2021-08-27 ENCOUNTER — Ambulatory Visit (HOSPITAL_BASED_OUTPATIENT_CLINIC_OR_DEPARTMENT_OTHER): Payer: Medicare Other | Admitting: Physician Assistant

## 2021-08-27 ENCOUNTER — Encounter: Payer: Self-pay | Admitting: Hematology and Oncology

## 2021-08-27 ENCOUNTER — Inpatient Hospital Stay: Payer: Medicare Other

## 2021-08-27 VITALS — BP 125/55 | HR 77 | Temp 98.2°F | Resp 18

## 2021-08-27 DIAGNOSIS — R55 Syncope and collapse: Secondary | ICD-10-CM

## 2021-08-27 DIAGNOSIS — Z95828 Presence of other vascular implants and grafts: Secondary | ICD-10-CM

## 2021-08-27 LAB — CBC (CANCER CENTER ONLY)
HCT: 38.1 % (ref 36.0–46.0)
Hemoglobin: 13.4 g/dL (ref 12.0–15.0)
MCH: 35 pg — ABNORMAL HIGH (ref 26.0–34.0)
MCHC: 35.2 g/dL (ref 30.0–36.0)
MCV: 99.5 fL (ref 80.0–100.0)
Platelet Count: 165 10*3/uL (ref 150–400)
RBC: 3.83 MIL/uL — ABNORMAL LOW (ref 3.87–5.11)
RDW: 12.2 % (ref 11.5–15.5)
WBC Count: 4.4 10*3/uL (ref 4.0–10.5)
nRBC: 0 % (ref 0.0–0.2)

## 2021-08-27 LAB — COMPREHENSIVE METABOLIC PANEL
ALT: 37 U/L (ref 0–44)
AST: 29 U/L (ref 15–41)
Albumin: 3.7 g/dL (ref 3.5–5.0)
Alkaline Phosphatase: 67 U/L (ref 38–126)
Anion gap: 5 (ref 5–15)
BUN: 19 mg/dL (ref 8–23)
CO2: 27 mmol/L (ref 22–32)
Calcium: 8.9 mg/dL (ref 8.9–10.3)
Chloride: 108 mmol/L (ref 98–111)
Creatinine, Ser: 0.79 mg/dL (ref 0.44–1.00)
GFR, Estimated: >60 mL/min (ref >60)
Glucose, Bld: 133 mg/dL — ABNORMAL HIGH (ref 70–99)
Potassium: 4.1 mmol/L (ref 3.5–5.1)
Sodium: 140 mmol/L (ref 135–145)
Total Bilirubin: 0.7 mg/dL (ref 0.3–1.2)
Total Protein: 6.8 g/dL (ref 6.5–8.1)

## 2021-08-27 MED ORDER — SODIUM CHLORIDE 0.9 % IV SOLN: INTRAVENOUS | Status: DC

## 2021-08-27 MED ORDER — HEPARIN SOD (PORK) LOCK FLUSH 100 UNIT/ML IV SOLN
500.0000 [IU] | Freq: Once | INTRAVENOUS | Status: AC
  Administered 2021-08-27: 500 [IU]

## 2021-08-27 MED ORDER — SODIUM CHLORIDE 0.9% FLUSH
10.0000 mL | Freq: Once | INTRAVENOUS | Status: AC
  Administered 2021-08-27: 10 mL

## 2021-08-27 NOTE — Progress Notes (Signed)
Pt presented for phlebotomy today per MD orders. Procedure started at 0016 via port, a total of 374ms was pulled off of pt ending at 1445 when pt was c/o of "heart fluttering, feeling short of breat, and woozy". Pt vitals remained WDL, see flowsheets. At this time the phlebotomy was stopped and 1L NS hung, KMilinda Cave PA was called to chairside, EKG was obtained and CBG taken. CBG was 90 EKG reviewed by PA, pt offered juice and snacks. Per PA pt to received 1LNS/1Hr.  ? ?Pt received fluids over an hour, VS improved, pt reports feeling "much better", pt walked with this RN and reported feeling well enough to drive home. Pt de-accessed and d/c'ed in stable condition, ambulatory to lobby. Pt instructed to call with any questions or concers, pt verbalized understanding.  ?

## 2021-08-27 NOTE — Progress Notes (Signed)
? ? ? ?Symptom Management Consult note ?[facility]   ? ?Patient Care Team: ?Jonathon Jordan, MD as PCP - General (Family Medicine) ?Nelva Bush, MD as PCP - Cardiology (Cardiology) ?Gaynelle Arabian, MD (Orthopedic Surgery)  ? ? ?Name of the patient: Michelle [name]  [id number]  04-18-1946  ? ?Date of visit: 08/27/2021  ? ? ?Chief complaint/ Reason for visit- palpitations and feeling anxious ? ?Oncology History  ?Hereditary hemochromatosis ([place])  ? ? ?Current Therapy: phlebotomy ? ?Interval history- Michelle [name] is a 76 yo female with medical history of hereditary hemochromatosis seen in the infusion center today with chief complaint of palpitations and feeling anxious.  Patient was here for phlebotomy and after 350 mls were removed patient reported feeling "heart fluttering, short of breath, and woozy." Phlebotomy was stopped and 1 L NS started. We checked her glucose and it was 90. She was given juice. She reports she was in her usual state of health prior to appointment today. She typically has 250 mls removed before clotting and having to stop she admits. She recently started taking daily baby aspirin. Denies fever, chills, nausea, syncope. ? ? ? ?ROS  ?All other systems are reviewed and are negative for acute change except as noted in the HPI. ? ? ? ?No Known Allergies ? ? ?Past Medical History:  ?Diagnosis Date  ? Acute hypoxemic respiratory failure ([place]) 04/24/2017  ? Anxiety   ? Arthritis   ? "back" (04/24/2017)  ? Blurry vision, left eye   ? "YAG on the left; to be repaired" (04/24/2017)  ? Chondromalacia of knee   ? right knee  ? Chronic bronchitis ([place])   ? Chronic lower back pain   ? DDD (degenerative disc disease) 07-24-11  ? 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 Depression 07-24-11  ? tx. depression only  ? Fatty liver   ? History of diverticulitis 02/2018  ? Hx of adenomatous polyp of colon 03/21/2014  ? Hyperlipidemia   ? Hypertension   ? IBS (irritable bowel syndrome)   ? OSA  on CPAP 12/07/2016  ? Pneumonia 1990s X 1  ? SOB (shortness of breath)   ? Vitamin D deficiency   ? ? ? ?Past Surgical History:  ?Procedure Laterality Date  ? APPENDECTOMY  1967  ? BOTOX INJECTION    ? "face"  ? CATARACT EXTRACTION W/ INTRAOCULAR LENS IMPLANT Bilateral 2000s  ? [place]; 1975  ? COLONOSCOPY W/ BIOPSIES AND POLYPECTOMY  "a few"  ? DILATION AND CURETTAGE OF UTERUS    ? HYSTEROSCOPY WITH D & C  08/29/2006  ? and resection of endometrial polyp/notes 09/10/2010  ? IR IMAGING GUIDED PORT INSERTION  01/18/2021  ? JOINT REPLACEMENT    ? KNEE ARTHROSCOPY Bilateral   ? NASAL SEPTUM SURGERY  1972  ? OOPHORECTOMY Right 1994  ? TOTAL KNEE ARTHROPLASTY  08/05/2011  ? Procedure: TOTAL KNEE ARTHROPLASTY;  Surgeon: Gearlean Alf, MD;  Location: [facility];  Service: Orthopedics;  Laterality: Right;  ? TOTAL KNEE ARTHROPLASTY Left 05/2009  ? TUBAL LIGATION  1975  ? ? ?Social History  ? ?Socioeconomic History  ? Marital status: Divorced  ?  Spouse name: Not on file  ? Number of children: 2  ? Years of education: Not on file  ? Highest education level: Not on file  ?Occupational History  ? Occupation: Retired Secretary/administrator  ?Tobacco Use  ? Smoking status: Former  ?  Packs/day: 0.12  ?  Years:  36.00  ?  Pack years: 4.32  ?  Types: Cigarettes  ?  Quit date: 06/19/2014  ?  Years since quitting: 7.1  ? Smokeless tobacco: Never  ?Vaping Use  ? Vaping Use: Never used  ?Substance and Sexual Activity  ? Alcohol use: Yes  ?  Alcohol/week: 21.0 standard drinks  ?  Types: 21 Glasses of wine per week  ?  Comment: 04/24/2017 "3, glasses of white wine qd"  ? Drug use: No  ? Sexual activity: Never  ?Other Topics Concern  ? Not on file  ?Social History Narrative  ? Not on file  ? ?Social Determinants of Health  ? ?Financial Resource Strain: Not on file  ?Food Insecurity: Not on file  ?Transportation Needs: Not on file  ?Physical Activity: Not on file  ?Stress: Not on file  ?Social Connections: Not on file  ?Intimate Partner  Violence: Not on file  ? ? ?Family History  ?Problem Relation Age of Onset  ? Heart disease Mother   ?     Rheumatic heart disease  ? Pulmonary embolism Mother   ? Heart disease Father   ?     valvular heart disease  ? Stroke Father   ? Hypertension Sister   ? Colon cancer Neg Hx   ? Esophageal cancer Neg Hx   ? Rectal cancer Neg Hx   ? Stomach cancer Neg Hx   ? Pancreatic cancer Neg Hx   ? ? ? ?Current Outpatient Medications:  ?  fluticasone (FLONASE) 50 MCG/ACT nasal spray, Place 1 spray into both nostrils daily., Disp: 16 g, Rfl: 4 ?  losartan (COZAAR) 50 MG tablet, Take 50 mg by mouth daily., Disp: , Rfl:  ?  sertraline (ZOLOFT) 100 MG tablet, Take 100 mg by mouth 2 (two) times a day. , Disp: , Rfl: 0 ? ?PHYSICAL EXAM: ?ECOG FS:1 - Symptomatic but completely ambulatory ?T: 98.2  BP: 131/65    HR: 70    Resp: 18      O2:95% RA ? ?  ?Physical Exam ?Vitals and nursing note reviewed.  ?Constitutional:   ?   Appearance: She is well-developed. She is not ill-appearing or toxic-appearing.  ?HENT:  ?   Head: Normocephalic and atraumatic.  ?   Nose: Nose normal.  ?Eyes:  ?   General: No scleral icterus.    ?   Right eye: No discharge.     ?   Left eye: No discharge.  ?   Conjunctiva/sclera: Conjunctivae normal.  ?Neck:  ?   Vascular: No JVD.  ?Cardiovascular:  ?   Rate and Rhythm: Normal rate and regular rhythm.  ?   Pulses: Normal pulses.     ?     Radial pulses are 2+ on the right side and 2+ on the left side.  ?   Heart sounds: Normal heart sounds.  ?Pulmonary:  ?   Effort: Pulmonary effort is normal. No respiratory distress.  ?   Breath sounds: Normal breath sounds. No stridor. No wheezing, rhonchi or rales.  ?Chest:  ?   Chest wall: No tenderness.  ?Abdominal:  ?   General: There is no distension.  ?Musculoskeletal:     ?   General: Normal range of motion.  ?   Cervical back: Normal range of motion.  ?Skin: ?   General: Skin is warm and dry.  ?   Capillary Refill: Capillary refill takes less than 2 seconds.   ?Neurological:  ?   Mental Status: She  is oriented to person, place, and time.  ?   GCS: GCS eye subscore is 4. GCS verbal subscore is 5. GCS motor subscore is 6.  ?   Comments: Fluent speech, no facial droop.  ?Psychiatric:     ?   Behavior: Behavior normal.  ?  ? ? ? ?LABORATORY DATA: ?I have reviewed the data as listed ? ?  Latest Ref Rng & Units 08/27/2021  ?  1:19 PM 07/27/2021  ?  2:37 PM 06/25/2021  ?  1:46 PM  ?CBC  ?WBC 4.0 - 10.5 K/uL 4.4   5.4   5.2    ?Hemoglobin 12.0 - 15.0 g/dL 13.4   13.9   14.2    ?Hematocrit 36.0 - 46.0 % 38.1   40.1   41.0    ?Platelets 150 - 400 K/uL 165   196   177    ? ? ? ? ?  Latest Ref Rng & Units 08/27/2021  ?  1:19 PM 07/27/2021  ?  2:37 PM 06/25/2021  ?  1:46 PM  ?CMP  ?Glucose 70 - 99 mg/dL 133   111   125    ?BUN 8 - 23 mg/dL '19   21   21    '$ ?Creatinine 0.44 - 1.00 mg/dL 0.79   0.99   0.89    ?Sodium 135 - 145 mmol/L 140   138   138    ?Potassium 3.5 - 5.1 mmol/L 4.1   4.3   4.3    ?Chloride 98 - 111 mmol/L 108   107   105    ?CO2 22 - 32 mmol/L '27   25   25    '$ ?Calcium 8.9 - 10.3 mg/dL 8.9   8.9   8.8    ?Total Protein 6.5 - 8.1 g/dL 6.8   7.2   7.1    ?Total Bilirubin 0.3 - 1.2 mg/dL 0.7   0.5   0.6    ?Alkaline Phos 38 - 126 U/L 67   80   69    ?AST 15 - 41 U/L '29   27   25    '$ ?ALT 0 - 44 U/L 37   38   31    ? ? ? ? ? ?RADIOGRAPHIC STUDIES: ?I have personally reviewed the radiological images as listed and agreed with the findings in the report. ?No images are attached to the encounter. ?No results found.  ? ?ASSESSMENT & PLAN: ?Patient is a 76 y.o. female with history of hereditary hemachromatosis followed by oncologist Dr. Chryl Heck. I viewed recent oncology notes and labs. ? ?#)vasovagal near syncope-patient did not have loss of consciousness, symptoms were suggestive of near syncope.  EKG reviewed by me shows normal sinus rhythm.  Glucose checked and was 90 so patient was given some juice.  I viewed patient's labs and hemoglobin stable at 13.4, hematocrit of 38 with the parameter  for therapeutic phlebotomy if >36.  After the IV fluids patient was feeling at baseline.  She ambulated without difficulty and had normal gait.  She returned to baseline and was stable to be discharged home.  Discu

## 2021-08-27 NOTE — Patient Instructions (Signed)

## 2021-08-28 ENCOUNTER — Encounter: Payer: Medicare Other | Admitting: Physician Assistant

## 2021-08-28 LAB — GLUCOSE, CAPILLARY: Glucose-Capillary: 90 mg/dL (ref 70–99)

## 2021-08-29 ENCOUNTER — Encounter (HOSPITAL_COMMUNITY): Payer: Self-pay

## 2021-08-29 ENCOUNTER — Other Ambulatory Visit: Payer: Self-pay

## 2021-08-29 ENCOUNTER — Emergency Department (HOSPITAL_COMMUNITY): Payer: Medicare Other

## 2021-08-29 ENCOUNTER — Emergency Department (HOSPITAL_COMMUNITY)
Admission: EM | Admit: 2021-08-29 | Discharge: 2021-08-29 | Disposition: A | Discharge status: Home / Self Care | Payer: Medicare Other | Attending: Emergency Medicine | Admitting: Emergency Medicine

## 2021-08-29 DIAGNOSIS — J40 Bronchitis, not specified as acute or chronic: Secondary | ICD-10-CM

## 2021-08-29 DIAGNOSIS — Z87891 Personal history of nicotine dependence: Secondary | ICD-10-CM | POA: Insufficient documentation

## 2021-08-29 DIAGNOSIS — Z7982 Long term (current) use of aspirin: Secondary | ICD-10-CM | POA: Diagnosis not present

## 2021-08-29 DIAGNOSIS — I10 Essential (primary) hypertension: Secondary | ICD-10-CM | POA: Insufficient documentation

## 2021-08-29 DIAGNOSIS — R0600 Dyspnea, unspecified: Secondary | ICD-10-CM | POA: Diagnosis not present

## 2021-08-29 DIAGNOSIS — B354 Tinea corporis: Secondary | ICD-10-CM

## 2021-08-29 DIAGNOSIS — R0602 Shortness of breath: Secondary | ICD-10-CM

## 2021-08-29 LAB — BASIC METABOLIC PANEL
Anion gap: 8 (ref 5–15)
BUN: 26 mg/dL — ABNORMAL HIGH (ref 8–23)
CO2: 24 mmol/L (ref 22–32)
Calcium: 8.5 mg/dL — ABNORMAL LOW (ref 8.9–10.3)
Chloride: 106 mmol/L (ref 98–111)
Creatinine, Ser: 0.85 mg/dL (ref 0.44–1.00)
GFR, Estimated: >60 mL/min (ref >60)
Glucose, Bld: 98 mg/dL (ref 70–99)
Potassium: 3.6 mmol/L (ref 3.5–5.1)
Sodium: 138 mmol/L (ref 135–145)

## 2021-08-29 LAB — TROPONIN I (HIGH SENSITIVITY): Troponin I (High Sensitivity): 4 ng/L (ref <18)

## 2021-08-29 LAB — BRAIN NATRIURETIC PEPTIDE: B Natriuretic Peptide: 13.6 pg/mL (ref 0.0–100.0)

## 2021-08-29 LAB — LIPASE, BLOOD: Lipase: 42 U/L (ref 11–51)

## 2021-08-29 MED ORDER — IOHEXOL 350 MG/ML SOLN
80.0000 mL | Freq: Once | INTRAVENOUS | Status: AC | PRN
Start: 2021-08-29 — End: 2021-08-29
  Administered 2021-08-29: 80 mL via INTRAVENOUS

## 2021-08-29 MED ORDER — FLUCONAZOLE 150 MG PO TABS: 150.0000 mg | ORAL_TABLET | ORAL | 0 refills | Status: AC

## 2021-08-29 MED ORDER — DOXYCYCLINE HYCLATE 100 MG PO TABS
100.0000 mg | ORAL_TABLET | Freq: Once | ORAL | Status: AC
Start: 2021-08-29 — End: 2021-08-29
  Administered 2021-08-29: 100 mg via ORAL
  Filled 2021-08-29: qty 1

## 2021-08-29 MED ORDER — ALBUTEROL SULFATE HFA 108 (90 BASE) MCG/ACT IN AERS: 2.0000 | INHALATION_SPRAY | RESPIRATORY_TRACT | Status: DC | PRN

## 2021-08-29 MED ORDER — ALBUTEROL SULFATE HFA 108 (90 BASE) MCG/ACT IN AERS
2.0000 | INHALATION_SPRAY | Freq: Once | RESPIRATORY_TRACT | Status: AC
Start: 2021-08-29 — End: 2021-08-29
  Administered 2021-08-29: 2 via RESPIRATORY_TRACT
  Filled 2021-08-29: qty 6.7

## 2021-08-29 MED ORDER — AEROCHAMBER Z-STAT PLUS/MEDIUM MISC
Status: AC
Start: 2021-08-29 — End: 2021-08-29
  Filled 2021-08-29: qty 1

## 2021-08-29 MED ORDER — DOXYCYCLINE HYCLATE 100 MG PO CAPS: 100.0000 mg | ORAL_CAPSULE | Freq: Two times a day (BID) | ORAL | 0 refills | Status: AC

## 2021-08-29 NOTE — Discharge Instructions (Signed)
You are seen in the emergency room today with shortness of breath.  Your CT scan did not show any blood clots in the lungs.  I am treating you for bronchitis with an albuterol inhaler and some antibiotic.  Please keep your appointment with your primary care doctor on Friday.  I am also starting you on a once weekly pill for the next 4 weeks to treat the rash on your abdomen and back.  You can continue the Lotrimin.  ?

## 2021-08-29 NOTE — ED Provider Notes (Signed)
? ?Emergency Department Provider Note ? ? ?I have reviewed the triage vital signs and the nursing notes. ? ? ?HISTORY ? ?Chief Complaint ?Shortness of Breath ? ? ?HPI ?Michelle [name] is a 76 y.o. female with past medical history reviewed below included hemochromatosis presents to the emergency department for evaluation of shortness of breath.  Symptoms are progressively worsening over the past 2 months.  Denies any fevers or shaking chills.  No chest pain or tightness.  She takes an aspirin daily and follows with the cancer center for phlebotomy to manage her hemochromatosis.  She is not having any chest pain but does note some "nervous" feeling in the epigastric region of her abdomen which is constant, not worse with food, not worse with exertion.  No pleuritic pain. ? ? ?Past Medical History:  ?Diagnosis Date  ? Acute hypoxemic respiratory failure ([place]) 04/24/2017  ? Anxiety   ? Arthritis   ? "back" (04/24/2017)  ? Blurry vision, left eye   ? "YAG on the left; to be repaired" (04/24/2017)  ? Chondromalacia of knee   ? right knee  ? Chronic bronchitis ([place])   ? Chronic lower back pain   ? DDD (degenerative disc disease) 07-24-11  ? 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 Depression 07-24-11  ? tx. depression only  ? Fatty liver   ? History of diverticulitis 02/2018  ? Hx of adenomatous polyp of colon 03/21/2014  ? Hyperlipidemia   ? Hypertension   ? IBS (irritable bowel syndrome)   ? OSA on CPAP 12/07/2016  ? Pneumonia 1990s X 1  ? SOB (shortness of breath)   ? Vitamin D deficiency   ? ? ?Review of Systems ? ?Constitutional: No fever/chills ?Eyes: No visual changes. ?ENT: No sore throat. ?Cardiovascular: Denies chest pain. ?Respiratory: Positive shortness of breath. ?Gastrointestinal: Positive epigastric abdominal pain.  No nausea, no vomiting.  No diarrhea.  No constipation. ?Genitourinary: Negative for dysuria. ?Musculoskeletal: Negative for back pain. ?Skin: Positive rash on the abdomen.  ?Neurological:  Negative for headaches, focal weakness or numbness. ? ? ?____________________________________________ ? ? ?PHYSICAL EXAM: ? ?VITAL SIGNS: ?ED Triage Vitals [08/29/21 1845]  ?Enc Vitals Group  ?   BP 132/77  ?   Pulse Rate 82  ?   Resp 18  ?   Temp 98.2 ?F (36.8 ?C)  ?   Temp Source Oral  ?   SpO2 94 %  ? ?Constitutional: Alert and oriented. Well appearing and in no acute distress. ?Eyes: Conjunctivae are normal.  ?Head: Atraumatic. ?Nose: No congestion/rhinnorhea. ?Mouth/Throat: Mucous membranes are moist.   ?Neck: No stridor.   ?Cardiovascular: Normal rate, regular rhythm. Good peripheral circulation. Grossly normal heart sounds.   ?Respiratory: Normal respiratory effort.  No retractions. Lungs CTAB. ?Gastrointestinal: Soft and nontender. No distention.  ?Musculoskeletal: No lower extremity tenderness nor edema. No gross deformities of extremities. ?Neurologic:  Normal speech and language. No gross focal neurologic deficits are appreciated.  ?Skin:  Skin is warm, dry and intact. Patchy rash to the anterior abdomen with central clearing. No cellulitis.  ? ? ?____________________________________________ ?  ?LABS ?(all labs ordered are listed, but only abnormal results are displayed) ? ?Labs Reviewed  ?BASIC METABOLIC PANEL - Abnormal; Notable for the following components:  ?    Result Value  ? BUN 26 (*)   ? Calcium 8.5 (*)   ? All other components within normal limits  ?BRAIN NATRIURETIC PEPTIDE  ?LIPASE, BLOOD  ?TROPONIN I (HIGH SENSITIVITY)  ?TROPONIN I (HIGH  SENSITIVITY)  ? ?____________________________________________ ? ?EKG ? ? EKG Interpretation ? ?Date/Time:  Wednesday Aug 29 2021 18:44:58 EDT ?Ventricular Rate:  79 ?PR Interval:  158 ?QRS Duration: 84 ?QT Interval:  386 ?QTC Calculation: 443 ?R Axis:   -14 ?Text Interpretation: Sinus or ectopic atrial rhythm Low voltage, precordial leads Abnormal R-wave progression, early transition Confirmed by Nanda Quinton [id number]) on 08/29/2021 8:13:52 PM ?  ? ?   ? ? ?____________________________________________ ? ?RADIOLOGY ? ?DG Chest 2 View ? ?Result Date: 08/29/2021 ?CLINICAL DATA:  Dyspnea EXAM: CHEST - 2 VIEW COMPARISON:  05/30/2020 FINDINGS: Lungs are clear. No pneumothorax or pleural effusion. Right internal jugular chest port tip seen within superior vena cava. Cardiac size within normal limits. Pulmonary vascularity is normal. Osseous structures are age-appropriate. IMPRESSION: No active cardiopulmonary disease. Electronically Signed   By: Fidela Salisbury M.D.   On: 08/29/2021 20:04  ? ?CT Angio Chest PE W and/or Wo Contrast ? ?Result Date: 08/29/2021 ?CLINICAL DATA:  Pulmonary embolism suspected, high probability. Shortness of breath. EXAM: CT ANGIOGRAPHY CHEST WITH CONTRAST TECHNIQUE: Multidetector CT imaging of the chest was performed using the standard protocol during bolus administration of intravenous contrast. Multiplanar CT image reconstructions and MIPs were obtained to evaluate the vascular anatomy. RADIATION DOSE REDUCTION: This exam was performed according to the departmental dose-optimization program which includes automated exposure control, adjustment of the mA and/or kV according to patient size and/or use of iterative reconstruction technique. CONTRAST:  2m OMNIPAQUE IOHEXOL 350 MG/ML SOLN COMPARISON:  04/29/2017. FINDINGS: Cardiovascular: The heart is mildly enlarged and there is no pericardial effusion. A few scattered coronary artery calcifications are noted. A right internal jugular central venous catheter terminates in the right atrium. There is atherosclerotic calcification of the aorta without evidence of aneurysm. The pulmonary trunk is normal in caliber. No definite evidence of pulmonary embolism. Evaluation of the segmental and subsegmental arteries is limited due to respiratory motion artifact. Mediastinum/Nodes: No mediastinal, hilar, or axillary lymphadenopathy. There is nodularity of the left lobe of the thyroid with multiple small  calcifications. The trachea and esophagus are within normal limits. Lungs/Pleura: Strandy atelectasis is noted bilaterally. No effusion or pneumothorax. Upper Abdomen: There is a cyst in the left lobe of the liver. Musculoskeletal: Degenerative changes in the thoracic spine. No acute osseous abnormality is identified. Review of the MIP images confirms the above findings. IMPRESSION: 1. No definite evidence of pulmonary embolism. 2. Scattered atelectasis in the lungs bilaterally. 3. Aortic atherosclerosis and coronary artery calcifications. Electronically Signed   By: LBrett FairyM.D.   On: 08/29/2021 21:54   ? ?____________________________________________ ? ? ?PROCEDURES ? ?Procedure(s) performed:  ? ?Procedures ? ?None  ?____________________________________________ ? ? ?INITIAL IMPRESSION / ASSESSMENT AND PLAN / ED COURSE ? ?Pertinent labs & imaging results that were available during my care of the patient were reviewed by me and considered in my medical decision making (see chart for details). ?  ?This patient is Presenting for Evaluation of SOB, which does require a range of treatment options, and is a complaint that involves a high risk of morbidity and mortality. ? ?The Differential Diagnoses includes CHF, PE, ACS, CAP, viral process.  ? ?Critical Interventions-  ?  ?Medications  ?albuterol (VENTOLIN HFA) 108 (90 Base) MCG/ACT inhaler 2 puff (has no administration in time range)  ?albuterol (VENTOLIN HFA) 108 (90 Base) MCG/ACT inhaler 2 puff (has no administration in time range)  ?doxycycline (VIBRA-TABS) tablet 100 mg (has no administration in time range)  ?iohexol (OMNIPAQUE) 350 MG/ML  injection 80 mL (80 mLs Intravenous Contrast Given 08/29/21 2135)  ? ? ?Reassessment after intervention: Patient feeling well. No hypoxemia.  ? ? ?I decided to review pertinent External Data, and in summary patient followed at the [facility] cancer center with CMP and CBC done two days prior.  ?  ?Clinical Laboratory Tests Ordered,  included BNP and troponin which is normal. No AKI.  ? ?Radiologic Tests Ordered, included CXR and CTA. I independently interpreted the images and agree with radiology interpretation.  ? ?Cardiac Monitor Tracing which sho

## 2021-08-29 NOTE — ED Provider Triage Note (Signed)
Emergency Medicine Provider Triage Evaluation Note ? ?Michelle [name] , a 76 y.o. female  was evaluated in triage.  Pt complains of shortness of breath x 8 weeks.  Patient feels that she is unable to go about 15 feet without feeling very short of breath.  She is followed at the cancer center for hemochromatosis, reports that when they do blood draws, she is unable to get blood out as she begins to clot.  She did start herself on a baby aspirin there was improvement in blood draws on Monday.  She does endorse pain along her entire back but no prior history of blood clots, no chest pain. ? ?Review of Systems  ?Positive: Shortness of breath, ?Negative: Chest pain, leg swelling, cough, fever ? ?Physical Exam  ?BP 132/77   Pulse 82   Temp 98.2 ?F (36.8 ?C) (Oral)   Resp 18   SpO2 94%  ?Gen:   Awake, no distress   ?Resp:  Normal effort  ?MSK:   Moves extremities without difficulty  ?Other:  No bilateral pitting edema ? ?Medical Decision Making  ?Medically screening exam initiated at 7:00 PM.  Appropriate orders placed.  SADAE ARRAZOLA was informed that the remainder of the evaluation will be completed by another provider, this initial triage assessment does not replace that evaluation, and the importance of remaining in the ED until their evaluation is complete. ? ?Concern for pulmonary embolism. ?  [name], PA-C ?08/29/21 1910 ? ?

## 2021-08-29 NOTE — ED Triage Notes (Signed)
Pt c/o SOB at all times, starting about 8 weeks ago and has gotten progressively worse. Pt denies any lung problems. Pt 94% RA in triage. Pt states she started taking a daily aspirin. Pt is being seen at [facility].  ?

## 2021-08-31 DIAGNOSIS — Z6841 Body Mass Index (BMI) 40.0 and over, adult: Secondary | ICD-10-CM | POA: Diagnosis not present

## 2021-08-31 DIAGNOSIS — I700 Atherosclerosis of aorta: Secondary | ICD-10-CM | POA: Diagnosis not present

## 2021-08-31 DIAGNOSIS — J209 Acute bronchitis, unspecified: Secondary | ICD-10-CM | POA: Diagnosis not present

## 2021-08-31 DIAGNOSIS — I2510 Atherosclerotic heart disease of native coronary artery without angina pectoris: Secondary | ICD-10-CM | POA: Diagnosis not present

## 2021-09-03 ENCOUNTER — Other Ambulatory Visit: Payer: Self-pay | Admitting: *Deleted

## 2021-09-04 ENCOUNTER — Inpatient Hospital Stay: Payer: Medicare Other

## 2021-09-04 ENCOUNTER — Other Ambulatory Visit: Payer: Self-pay

## 2021-09-04 DIAGNOSIS — Z95828 Presence of other vascular implants and grafts: Secondary | ICD-10-CM

## 2021-09-04 LAB — COMPREHENSIVE METABOLIC PANEL
ALT: 27 U/L (ref 0–44)
AST: 22 U/L (ref 15–41)
Albumin: 3.8 g/dL (ref 3.5–5.0)
Alkaline Phosphatase: 80 U/L (ref 38–126)
Anion gap: 7 (ref 5–15)
BUN: 23 mg/dL (ref 8–23)
CO2: 25 mmol/L (ref 22–32)
Calcium: 8.7 mg/dL — ABNORMAL LOW (ref 8.9–10.3)
Chloride: 106 mmol/L (ref 98–111)
Creatinine, Ser: 0.96 mg/dL (ref 0.44–1.00)
GFR, Estimated: >60 mL/min (ref >60)
Glucose, Bld: 89 mg/dL (ref 70–99)
Potassium: 4 mmol/L (ref 3.5–5.1)
Sodium: 138 mmol/L (ref 135–145)
Total Bilirubin: 0.6 mg/dL (ref 0.3–1.2)
Total Protein: 6.6 g/dL (ref 6.5–8.1)

## 2021-09-04 LAB — CBC (CANCER CENTER ONLY)
HCT: 36.8 % (ref 36.0–46.0)
Hemoglobin: 13.1 g/dL (ref 12.0–15.0)
MCH: 35.3 pg — ABNORMAL HIGH (ref 26.0–34.0)
MCHC: 35.6 g/dL (ref 30.0–36.0)
MCV: 99.2 fL (ref 80.0–100.0)
Platelet Count: 189 10*3/uL (ref 150–400)
RBC: 3.71 MIL/uL — ABNORMAL LOW (ref 3.87–5.11)
RDW: 12.3 % (ref 11.5–15.5)
WBC Count: 5.2 10*3/uL (ref 4.0–10.5)
nRBC: 0 % (ref 0.0–0.2)

## 2021-09-04 MED ORDER — HEPARIN SOD (PORK) LOCK FLUSH 100 UNIT/ML IV SOLN
500.0000 [IU] | Freq: Once | INTRAVENOUS | Status: AC
  Administered 2021-09-04: 500 [IU]

## 2021-09-04 MED ORDER — SODIUM CHLORIDE 0.9% FLUSH
10.0000 mL | Freq: Once | INTRAVENOUS | Status: AC
  Administered 2021-09-04: 10 mL

## 2021-09-05 ENCOUNTER — Telehealth: Payer: Self-pay | Admitting: Hematology and Oncology

## 2021-09-05 LAB — FERRITIN: Ferritin: 73 ng/mL (ref 11–307)

## 2021-09-05 NOTE — Telephone Encounter (Signed)
.  Called patient to schedule appointment per 5/16 inbasket, patient is aware of date and time.   ?

## 2021-09-06 ENCOUNTER — Telehealth: Payer: Self-pay

## 2021-09-06 NOTE — Telephone Encounter (Signed)
NOTES SCANNED TO REFERRAL 

## 2021-09-18 ENCOUNTER — Ambulatory Visit: Payer: Medicare Other | Admitting: Internal Medicine

## 2021-09-21 ENCOUNTER — Encounter: Payer: Self-pay | Admitting: Cardiovascular Disease

## 2021-09-21 ENCOUNTER — Ambulatory Visit: Payer: Medicare Other | Admitting: Cardiovascular Disease

## 2021-09-21 VITALS — BP 120/78 | HR 94 | Ht 61.0 in | Wt 244.6 lb

## 2021-09-21 DIAGNOSIS — R079 Chest pain, unspecified: Secondary | ICD-10-CM

## 2021-09-21 DIAGNOSIS — Z01812 Encounter for preprocedural laboratory examination: Secondary | ICD-10-CM

## 2021-09-21 MED ORDER — METOPROLOL TARTRATE 100 MG PO TABS: 100.0000 mg | ORAL_TABLET | Freq: Once | ORAL | 0 refills | Status: DC

## 2021-09-21 NOTE — Progress Notes (Signed)
Cardiology Office Note:    Date:  09/21/2021   ID:  Michelle [name], DOB 1945/07/20, MRN [id number]  PCP:  Jonathon Jordan, MD   [facility] Providers Cardiologist:  Mertie Moores, MD {    Referring MD: Jonathon Jordan, MD   Chief Complaint  Patient presents with   Chest Pain     History of Present Illness:    Michelle [name] is a 76 y.o. female with a hx of chest pain, obstructive sleep apnea, hyperlipidemia, hypertension.  Has hemochromatosis Was diagnosed with HC last year   Has seen Dr. Saunders Revel in the past and sees Dr. Radford Pax for OSA   Has had severe dyspnea and DOE for the past month CT angio of the chest was negative for pulmonary embolus.  She did have some aortic atherosclerosis and some coronary artery calcifications.  She has had several echocardiograms.  Her last echocardiogram from November, 2022 reveals normal left ventricular systolic function.  She has grade 1 diastolic dysfunction.  No leg swelling   Is having difficulty losing weight .  Does still eat processed foods   Bagel for breakfast  Supper - goes out or pork chop, lamb chop, pasta   Has hemachromatosis Was getting phlebotomized monthly for the past year.    Past Medical History:  Diagnosis Date   Acute hypoxemic respiratory failure ([place]) 04/24/2017   Anxiety    Arthritis    "back" (04/24/2017)   Atherosclerosis of coronary artery    Blurry vision, left eye    "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 depression only   DOE (dyspnea on exertion)    Fatty liver    History of diverticulitis 02/2018   Hx of adenomatous polyp of colon 03/21/2014   Hyperlipidemia    Hypertension    IBS (irritable bowel syndrome)    OSA on CPAP 12/07/2016   Pneumonia 1990s X 1   SOB  (shortness of breath)    Vitamin D deficiency     Past Surgical History:  Procedure Laterality Date   APPENDECTOMY  1967   BOTOX INJECTION     "face"   CATARACT EXTRACTION W/ INTRAOCULAR LENS IMPLANT Bilateral 2000s   CESAREAN SECTION  1972; 1975   COLONOSCOPY W/ BIOPSIES AND POLYPECTOMY  "a few"   DILATION AND CURETTAGE OF UTERUS     HYSTEROSCOPY WITH D & C  08/29/2006   and resection of endometrial polyp/notes 09/10/2010   IR IMAGING GUIDED PORT INSERTION  01/18/2021   JOINT REPLACEMENT     KNEE ARTHROSCOPY Bilateral    NASAL SEPTUM SURGERY  1972   OOPHORECTOMY Right 1994   TOTAL KNEE ARTHROPLASTY  08/05/2011   Procedure: TOTAL KNEE ARTHROPLASTY;  Surgeon: Gearlean Alf, MD;  Location: [facility];  Service: Orthopedics;  Laterality: Right;   TOTAL KNEE ARTHROPLASTY Left 05/2009   TUBAL LIGATION  1975    Current Medications: Current Meds  Medication Sig   albuterol (VENTOLIN HFA) 108 (90 Base) MCG/ACT inhaler Inhale into the lungs every 6 (six) hours as needed for wheezing or shortness of breath.   ALPRAZolam (NIRAVAM) 0.25 MG dissolvable tablet Take 0.25 mg by mouth at bedtime as needed for anxiety.   losartan (COZAAR) 25 MG tablet Take  25 mg by mouth daily.   metoprolol tartrate (LOPRESSOR) 100 MG tablet Take 1 tablet (100 mg total) by mouth once for 1 dose. Take 90-120 minutes prior to scan.   sertraline (ZOLOFT) 100 MG tablet Take 100 mg by mouth 2 (two) times a day.    [DISCONTINUED] fluticasone (FLONASE) 50 MCG/ACT nasal spray Place 1 spray into both nostrils daily.     Allergies:   Patient has no known allergies.   Social History   Socioeconomic History   Marital status: Divorced    Spouse name: Not on file   Number of children: 2   Years of education: Not on file   Highest education level: Not on file  Occupational History   Occupation: Retired Secretary/administrator  Tobacco Use   Smoking status: Former    Packs/day: 0.12    Years: 36.00    Pack years: 4.32    Types:  Cigarettes    Quit date: 06/19/2014    Years since quitting: 7.2   Smokeless tobacco: Never  Vaping Use   Vaping Use: Never used  Substance and Sexual Activity   Alcohol use: Yes    Alcohol/week: 21.0 standard drinks    Types: 21 Glasses of wine per week    Comment: 04/24/2017 "3, glasses of white wine qd"   Drug use: No   Sexual activity: Never  Other Topics Concern   Not on file  Social History Narrative   Not on file   Social Determinants of Health   Financial Resource Strain: Not on file  Food Insecurity: Not on file  Transportation Needs: Not on file  Physical Activity: Not on file  Stress: Not on file  Social Connections: Not on file     Family History: The patient's family history includes Heart disease in her father and mother; Hypertension in her sister; Pulmonary embolism in her mother; Stroke in her father. There is no history of Colon cancer, Esophageal cancer, Rectal cancer, Stomach cancer, or Pancreatic cancer.  ROS:   Please see the history of present illness.     All other systems reviewed and are negative.  EKGs/Labs/Other Studies Reviewed:    The following studies were reviewed today:   EKG:   08/29/21 - NSR , no ST or T wave abn.    Recent Labs: 08/29/2021: B Natriuretic Peptide 13.6 09/04/2021: ALT 27; BUN 23; Creatinine, Ser 0.96; Hemoglobin 13.1; Platelet Count 189; Potassium 4.0; Sodium 138  Recent Lipid Panel    Component Value Date/Time   CHOL 254 (H) 02/18/2018 1217   TRIG 153 (H) 02/18/2018 1217   HDL 101 02/18/2018 1217   LDLCALC 122 (H) 02/18/2018 1217     Risk Assessment/Calculations:           Physical Exam:    VS:  BP 120/78   Pulse 94   Ht '5\' 1"'$  (1.549 m)   Wt 244 lb 9.6 oz (110.9 kg)   SpO2 97%   BMI 46.22 kg/m     Wt Readings from Last 3 Encounters:  09/21/21 244 lb 9.6 oz (110.9 kg)  07/17/21 240 lb 3.2 oz (109 kg)  04/11/21 238 lb 8 oz (108.2 kg)     GEN:  moderately obese,  elderly female,  NAD  HEENT:  Normal NECK: No JVD; No carotid bruits LYMPHATICS: No lymphadenopathy CARDIAC: RRR, no murmurs, rubs, gallops RESPIRATORY:  Clear to auscultation without rales, wheezing or rhonchi  ABDOMEN: Soft, non-tender, non-distended MUSCULOSKELETAL:  No edema; No deformity  SKIN: several  arease of Morphea ( white induration surrounded by red hives.  NEUROLOGIC:  Alert and oriented x 3 PSYCHIATRIC:  Normal affect   ASSESSMENT:    1. Chest pain, unspecified type   2. Pre-procedure lab exam    PLAN:    In order of problems listed above:  DOE :   She has marked shortness of breath with exertion that has come on in the past several months.  This could be all due to her obesity but I cannot rule out the possibility of coronary ischemia.  We will get a coronary CT   She has had a negative CTA of the chest - negative for PE       She also has a history of hemochromatosis.  I like to get an MRI to make sure that she does not have cardiac  Hemochromatosis   2.  Morphea:   unusual skin rash.   Has seen derm Interesting but not harmful according to what I can find online .  She was wondering if it was due to covid vaccine .          Medication Adjustments/Labs and Tests Ordered: Current medicines are reviewed at length with the patient today.  Concerns regarding medicines are outlined above.  Orders Placed This Encounter  Procedures   CT CORONARY MORPH W/CTA COR W/SCORE W/CA W/CM &/OR WO/CM   MR CARDIAC MORPHOLOGY WO CONTRAST   Hemoglobin and hematocrit, blood   Basic metabolic panel   Meds ordered this encounter  Medications   metoprolol tartrate (LOPRESSOR) 100 MG tablet    Sig: Take 1 tablet (100 mg total) by mouth once for 1 dose. Take 90-120 minutes prior to scan.    Dispense:  1 tablet    Refill:  0    Patient Instructions  Medication Instructions:  Your physician recommends that you continue on your current medications as directed. Please refer to the Current Medication list  given to you today.  *If you need a refill on your cardiac medications before your next appointment, please call your pharmacy*  Lab Work: In 1-2 weeks: Hematocrit, hemoglobin, BMET If you have labs (blood work) drawn today and your tests are completely normal, you will receive your results only by: [place] (if you have MyChart) OR A paper copy in the mail If you have any lab test that is abnormal or we need to change your treatment, we will call you to review the results.  Testing/Procedures: Your physician has requested you have a Coronary CTA performed.  Your physician has requested you have a Cardiac MRI.  Follow-Up: At [facility], you and your health needs are our priority.  As part of our continuing mission to provide you with exceptional heart care, we have created designated Provider Care Teams.  These Care Teams include your primary Cardiologist (physician) and Advanced Practice Providers (APPs -  Physician Assistants and Nurse Practitioners) who all work together to provide you with the care you need, when you need it.  Your next appointment:   6 month(s)  The format for your next appointment:   In Person  Provider:   Mertie Moores, MD   Other Instructions   Your cardiac CT will be scheduled at the below location:   [facility] [address] [place], [place] [address] [phone]  At [facility], please arrive at the [facility] and Children's Entrance (Entrance C2) of [facility] 30 minutes prior to test start time. You can use  the FREE valet parking offered at entrance C (encouraged to control the heart rate for the test)  Proceed to the [facility] Radiology Department (first floor) to check-in and test prep.  All radiology patients and guests should use entrance C2 at [facility], accessed from [facility], even though the hospital's physical address listed is [address].    Please  follow these instructions carefully (unless otherwise directed):   On the Night Before the Test: Be sure to Drink plenty of water. Do not consume any caffeinated/decaffeinated beverages or chocolate 12 hours prior to your test. Do not take any antihistamines 12 hours prior to your test.  On the Day of the Test: Drink plenty of water until 1 hour prior to the test. Do not eat any food 4 hours prior to the test. You may take your regular medications prior to the test.  Take metoprolol (Lopressor) '100mg'$  two hours prior to test. FEMALES- please wear underwire-free bra if available, avoid dresses & tight clothing  After the Test: Drink plenty of water. After receiving IV contrast, you may experience a mild flushed feeling. This is normal. On occasion, you may experience a mild rash up to 24 hours after the test. This is not dangerous. If this occurs, you can take Benadryl 25 mg and increase your fluid intake. If you experience trouble breathing, this can be serious. If it is severe call 911 IMMEDIATELY. If it is mild, please call our office. If you take any of these medications: Glipizide/Metformin, Avandament, Glucavance, please do not take 48 hours after completing test unless otherwise instructed.  We will call to schedule your test 2-4 weeks out understanding that some insurance companies will need an authorization prior to the service being performed.   For non-scheduling related questions, please contact the cardiac imaging nurse navigator should you have any questions/concerns: Marchia Bond, Cardiac Imaging Nurse Navigator Gordy Clement, Cardiac Imaging Nurse Navigator [facility] Direct Office Dial: [phone]   For scheduling needs, including cancellations and rescheduling, please call [place], [phone].     Important Information About Sugar         Signed, Mertie Moores, MD  09/21/2021 5:40 PM    [facility]

## 2021-09-21 NOTE — Patient Instructions (Signed)
Medication Instructions:  Your physician recommends that you continue on your current medications as directed. Please refer to the Current Medication list given to you today.  *If you need a refill on your cardiac medications before your next appointment, please call your pharmacy*  Lab Work: In 1-2 weeks: Hematocrit, hemoglobin, BMET If you have labs (blood work) drawn today and your tests are completely normal, you will receive your results only by: [place] (if you have MyChart) OR A paper copy in the mail If you have any lab test that is abnormal or we need to change your treatment, we will call you to review the results.  Testing/Procedures: Your physician has requested you have a Coronary CTA performed.  Your physician has requested you have a Cardiac MRI.  Follow-Up: At [facility], you and your health needs are our priority.  As part of our continuing mission to provide you with exceptional heart care, we have created designated Provider Care Teams.  These Care Teams include your primary Cardiologist (physician) and Advanced Practice Providers (APPs -  Physician Assistants and Nurse Practitioners) who all work together to provide you with the care you need, when you need it.  Your next appointment:   6 month(s)  The format for your next appointment:   In Person  Provider:   Mertie Moores, MD   Other Instructions   Your cardiac CT will be scheduled at the below location:   [facility] [address] [place], [place] [address] [phone]  At [facility], please arrive at the [facility] and Children's Entrance (Entrance C2) of [facility] 30 minutes prior to test start time. You can use the FREE valet parking offered at entrance C (encouraged to control the heart rate for the test)  Proceed to the [facility] Radiology Department (first floor) to check-in and test prep.  All radiology patients and guests should use entrance C2 at  [facility], accessed from [facility], even though the hospital's physical address listed is [address]    Please follow these instructions carefully (unless otherwise directed):   On the Night Before the Test: Be sure to Drink plenty of water. Do not consume any caffeinated/decaffeinated beverages or chocolate 12 hours prior to your test. Do not take any antihistamines 12 hours prior to your test.  On the Day of the Test: Drink plenty of water until 1 hour prior to the test. Do not eat any food 4 hours prior to the test. You may take your regular medications prior to the test.  Take metoprolol (Lopressor) '100mg'$  two hours prior to test. FEMALES- please wear underwire-free bra if available, avoid dresses & tight clothing  After the Test: Drink plenty of water. After receiving IV contrast, you may experience a mild flushed feeling. This is normal. On occasion, you may experience a mild rash up to 24 hours after the test. This is not dangerous. If this occurs, you can take Benadryl 25 mg and increase your fluid intake. If you experience trouble breathing, this can be serious. If it is severe call 911 IMMEDIATELY. If it is mild, please call our office. If you take any of these medications: Glipizide/Metformin, Avandament, Glucavance, please do not take 48 hours after completing test unless otherwise instructed.  We will call to schedule your test 2-4 weeks out understanding that some insurance companies will need an authorization prior to the service being performed.   For non-scheduling related questions, please contact  the cardiac imaging nurse navigator should you have any questions/concerns: Marchia Bond, Cardiac Imaging Nurse Navigator Gordy Clement, Cardiac Imaging Nurse Navigator [place] [facility] Direct Office Dial: [phone]   For scheduling needs, including cancellations and rescheduling, please call [place],  [phone].     Important Information About Sugar

## 2021-10-02 DIAGNOSIS — C44321 Squamous cell carcinoma of skin of nose: Secondary | ICD-10-CM | POA: Diagnosis not present

## 2021-10-04 ENCOUNTER — Other Ambulatory Visit: Payer: Medicare Other

## 2021-10-04 DIAGNOSIS — Z01812 Encounter for preprocedural laboratory examination: Secondary | ICD-10-CM | POA: Diagnosis not present

## 2021-10-04 LAB — BASIC METABOLIC PANEL
BUN/Creatinine Ratio: 23 (ref 12–28)
BUN: 21 mg/dL (ref 8–27)
CO2: 25 mmol/L (ref 20–29)
Calcium: 9.3 mg/dL (ref 8.7–10.3)
Chloride: 104 mmol/L (ref 96–106)
Creatinine, Ser: 0.9 mg/dL (ref 0.57–1.00)
Glucose: 91 mg/dL (ref 70–99)
Potassium: 4.5 mmol/L (ref 3.5–5.2)
Sodium: 138 mmol/L (ref 134–144)
eGFR: 67 mL/min/{1.73_m2} (ref >59)

## 2021-10-04 LAB — HEMOGLOBIN AND HEMATOCRIT, BLOOD
Hematocrit: 40.4 % (ref 34.0–46.6)
Hemoglobin: 13.7 g/dL (ref 11.1–15.9)

## 2021-10-08 ENCOUNTER — Telehealth: Payer: Self-pay | Admitting: Cardiovascular Disease

## 2021-10-08 MED ORDER — NITROGLYCERIN 0.4 MG SL SUBL: SUBLINGUAL_TABLET | SUBLINGUAL | 3 refills | Status: DC

## 2021-10-08 NOTE — Telephone Encounter (Signed)
Pt c/o of Chest Pain: STAT if CP now or developed within 24 hours  1. Are you having CP right now? no  2. Are you experiencing any other symptoms (ex. SOB, nausea, vomiting, sweating)? no  3. How long have you been experiencing CP? A week  4. Is your CP continuous or coming and going? Coming and going  5. Have you taken Nitroglycerin? no ?  Pt has been having stabbing pain in left side of chest and arms. It has been going on for a week and she feels like it is getting worse.

## 2021-10-08 NOTE — Telephone Encounter (Signed)
Reached out to patient who states that she's having intermittent CP occurring "a few times each week" at night when she is resting, which she describes as a stabbing, "toothache" pain when it occurs. She states the episodes last approximately 3-4 minutes and usually has 2-3 episodes during the night  on night's when it happens. She denies any radiation of pain to arm/jaw/shoulder or other areas, but does state she feels " fluttering, butterfly" feeling in her stomach with the episodes. Denies worsening SOB at times of pain, also no N/v or diaphoresis. Pt states she does not eat a lot of spicy foods, but will try to monitor if episodes could be precipitated by certain foods. Pt has no BP log that she can provide-states she's been out of town at [place] for vacation and has not been checking it. Advised her to do so. Educated patient on the use of NTG in the event MD would like her to begin using. Will route to MD for review. Pt currently scheduled for cardiac CT & MRI.

## 2021-10-08 NOTE — Telephone Encounter (Signed)
Called and spoke with patient who understands and will pick up rx tomorrow. She will call after using to give update on if it helped or not. RX sent to [facility] per request.

## 2021-10-09 DIAGNOSIS — Z4802 Encounter for removal of sutures: Secondary | ICD-10-CM | POA: Diagnosis not present

## 2021-10-09 DIAGNOSIS — L570 Actinic keratosis: Secondary | ICD-10-CM | POA: Diagnosis not present

## 2021-10-12 ENCOUNTER — Telehealth (HOSPITAL_COMMUNITY): Payer: Self-pay | Admitting: Emergency Medicine

## 2021-10-12 NOTE — Telephone Encounter (Signed)
Reaching out to patient to offer assistance regarding upcoming cardiac imaging study; pt verbalizes understanding of appt date/time, parking situation and where to check in, pre-test NPO status and medications ordered, and verified current allergies; name and call back number provided for further questions should they arise ?Marchia Bond RN Navigator Cardiac Imaging ? Heart and Vascular ?[phone] office ?[phone] cell ? ?Denies iv issues ?'100mg'$  metoprolol tartrate ?Arrival 130 ? ?

## 2021-10-15 ENCOUNTER — Ambulatory Visit (HOSPITAL_COMMUNITY)
Admission: RE | Admit: 2021-10-15 | Discharge: 2021-10-15 | Disposition: A | Discharge status: Home / Self Care | Payer: Medicare Other | Source: Ambulatory Visit | Attending: Cardiovascular Disease | Admitting: Cardiovascular Disease

## 2021-10-15 DIAGNOSIS — I2510 Atherosclerotic heart disease of native coronary artery without angina pectoris: Secondary | ICD-10-CM | POA: Insufficient documentation

## 2021-10-15 DIAGNOSIS — R079 Chest pain, unspecified: Secondary | ICD-10-CM | POA: Insufficient documentation

## 2021-10-15 DIAGNOSIS — J439 Emphysema, unspecified: Secondary | ICD-10-CM | POA: Insufficient documentation

## 2021-10-15 DIAGNOSIS — I700 Atherosclerosis of aorta: Secondary | ICD-10-CM | POA: Diagnosis not present

## 2021-10-15 MED ORDER — NITROGLYCERIN 0.4 MG SL SUBL
SUBLINGUAL_TABLET | SUBLINGUAL | Status: AC
  Filled 2021-10-15: qty 2

## 2021-10-15 MED ORDER — NITROGLYCERIN 0.4 MG SL SUBL
0.8000 mg | SUBLINGUAL_TABLET | Freq: Once | SUBLINGUAL | Status: AC
  Administered 2021-10-15: 0.8 mg via SUBLINGUAL

## 2021-10-15 MED ORDER — IOHEXOL 350 MG/ML SOLN
190.0000 mL | Freq: Once | INTRAVENOUS | Status: AC | PRN
  Administered 2021-10-15: 190 mL via INTRAVENOUS

## 2021-10-16 ENCOUNTER — Telehealth: Payer: Self-pay | Admitting: Cardiovascular Disease

## 2021-10-16 DIAGNOSIS — R0609 Other forms of dyspnea: Secondary | ICD-10-CM

## 2021-10-16 NOTE — Telephone Encounter (Signed)
Pt would like a call back regarding CT results once they have been released. Please advise

## 2021-10-25 ENCOUNTER — Encounter: Payer: Self-pay | Admitting: Cardiovascular Disease

## 2021-10-25 ENCOUNTER — Telehealth: Payer: Self-pay | Admitting: Internal Medicine

## 2021-10-25 NOTE — Telephone Encounter (Signed)
I am okay with switching.

## 2021-10-25 NOTE — Telephone Encounter (Signed)
Called and spoke with patient. She stated that she would like to switch providers from Dr. Shearon Stalls to another provider in office. She did not have a preference. I advised her that since she also has OSA, she would need someone who specializes in sleep as well. She agreed. I give her the options of our sleep doctors and she chose Dr. Halford Chessman. She is aware of the office protocol for switching providers.   Dr. Shearon Stalls, are you ok with her switching to another provider?   Dr. Halford Chessman, are you ok with accepting her as a new patient?

## 2021-10-26 DIAGNOSIS — M5451 Vertebrogenic low back pain: Secondary | ICD-10-CM | POA: Diagnosis not present

## 2021-10-26 DIAGNOSIS — M259 Joint disorder, unspecified: Secondary | ICD-10-CM | POA: Diagnosis not present

## 2021-10-26 NOTE — Telephone Encounter (Signed)
Ok with me 

## 2021-10-26 NOTE — Telephone Encounter (Signed)
Tried calling pt and there was no answer- LMTCB   Sending to front desk to schedule new pt appt with Dr Mabeline Caras!

## 2021-10-26 NOTE — Telephone Encounter (Signed)
Pt scheduled to see VS as New Pt on 8/23

## 2021-10-29 ENCOUNTER — Other Ambulatory Visit: Payer: Self-pay | Admitting: Orthopedic Surgery

## 2021-10-29 DIAGNOSIS — M259 Joint disorder, unspecified: Secondary | ICD-10-CM

## 2021-11-05 DIAGNOSIS — B349 Viral infection, unspecified: Secondary | ICD-10-CM | POA: Diagnosis not present

## 2021-11-05 DIAGNOSIS — R051 Acute cough: Secondary | ICD-10-CM | POA: Diagnosis not present

## 2021-11-12 ENCOUNTER — Encounter: Payer: Self-pay | Admitting: Cardiovascular Disease

## 2021-11-15 ENCOUNTER — Encounter: Payer: Self-pay | Admitting: Hematology and Oncology

## 2021-11-15 ENCOUNTER — Ambulatory Visit: Payer: Medicare Other

## 2021-11-15 ENCOUNTER — Inpatient Hospital Stay: Payer: Medicare Other | Attending: Hematology and Oncology | Admitting: Hematology and Oncology

## 2021-11-15 ENCOUNTER — Other Ambulatory Visit: Payer: Self-pay

## 2021-11-15 ENCOUNTER — Inpatient Hospital Stay: Payer: Medicare Other

## 2021-11-15 DIAGNOSIS — Z79899 Other long term (current) drug therapy: Secondary | ICD-10-CM | POA: Insufficient documentation

## 2021-11-15 LAB — CBC WITH DIFFERENTIAL/PLATELET
Abs Immature Granulocytes: 0.01 10*3/uL (ref 0.00–0.07)
Basophils Absolute: 0.1 10*3/uL (ref 0.0–0.1)
Basophils Relative: 1 %
Eosinophils Absolute: 0.3 10*3/uL (ref 0.0–0.5)
Eosinophils Relative: 5 %
HCT: 40.1 % (ref 36.0–46.0)
Hemoglobin: 14.5 g/dL (ref 12.0–15.0)
Immature Granulocytes: 0 %
Lymphocytes Relative: 30 %
Lymphs Abs: 1.7 10*3/uL (ref 0.7–4.0)
MCH: 35.3 pg — ABNORMAL HIGH (ref 26.0–34.0)
MCHC: 36.2 g/dL — ABNORMAL HIGH (ref 30.0–36.0)
MCV: 97.6 fL (ref 80.0–100.0)
Monocytes Absolute: 0.7 10*3/uL (ref 0.1–1.0)
Monocytes Relative: 12 %
Neutro Abs: 3 10*3/uL (ref 1.7–7.7)
Neutrophils Relative %: 52 %
Platelets: 192 10*3/uL (ref 150–400)
RBC: 4.11 MIL/uL (ref 3.87–5.11)
RDW: 11.9 % (ref 11.5–15.5)
WBC: 5.8 10*3/uL (ref 4.0–10.5)
nRBC: 0 % (ref 0.0–0.2)

## 2021-11-15 LAB — COMPREHENSIVE METABOLIC PANEL
ALT: 37 U/L (ref 0–44)
AST: 29 U/L (ref 15–41)
Albumin: 4.1 g/dL (ref 3.5–5.0)
Alkaline Phosphatase: 74 U/L (ref 38–126)
Anion gap: 9 (ref 5–15)
BUN: 24 mg/dL — ABNORMAL HIGH (ref 8–23)
CO2: 24 mmol/L (ref 22–32)
Calcium: 9 mg/dL (ref 8.9–10.3)
Chloride: 107 mmol/L (ref 98–111)
Creatinine, Ser: 1.1 mg/dL — ABNORMAL HIGH (ref 0.44–1.00)
GFR, Estimated: 53 mL/min — ABNORMAL LOW (ref >60)
Glucose, Bld: 100 mg/dL — ABNORMAL HIGH (ref 70–99)
Potassium: 3.8 mmol/L (ref 3.5–5.1)
Sodium: 140 mmol/L (ref 135–145)
Total Bilirubin: 0.7 mg/dL (ref 0.3–1.2)
Total Protein: 7.2 g/dL (ref 6.5–8.1)

## 2021-11-15 NOTE — Progress Notes (Signed)
[facility]   [phone] [phone]   Clinic Follow up Note   Patient Care Team: Jonathon Jordan, MD as PCP - General (Family Medicine) Nahser, Wonda Cheng, MD as PCP - Cardiology (Cardiology) Gaynelle Arabian, MD (Orthopedic Surgery) Date of Service: 02/19/21   CHIEF COMPLAINT: Follow up hereditary hemochromatosis   CURRENT THERAPY: therapeutic phlebotomy if HCT >36 and to maintain ferritin < 500, recently done q2 weeks starting 01/09/21  INTERVAL HISTORY: Ms. Friedt returns for follow up and phlebotomy as scheduled.   Ms. Scheiber is here for follow-up by herself.  Since last visit she continues to deal with a blistering rash.  She followed up with dermatology as well as hematology and [facility], not pleased with the hematology recommendations from [facility].  She apparently had some labs drawn for porphyria but since the urine was not protected from light, these could not be run.  She continues on phlebotomy every 3 to 4 months. Besides skin rash and need for phlebotomy, she denies any new concerns. No change in breathing/bowel habits/urinary habits Rest of the pertinent 10 point ROS reviewed and negative.  MEDICAL HISTORY:  Past Medical History:  Diagnosis Date   Acute hypoxemic respiratory failure ([place]) 04/24/2017   Anxiety    Arthritis    "back" (04/24/2017)   Atherosclerosis of coronary artery    Blurry vision, left eye    "YAG on the left; to be repaired" (04/24/2017)   Chondromalacia of knee    right knee   Chronic bronchitis (HCC)    Chronic lower back pain    DDD (degenerative disc disease) 07/24/2011   Osteoarthritis-s/p LTKA 6'38, now right planned   Depression 07/24/2011   tx. depression only   DOE (dyspnea on exertion)    Fatty liver    History of diverticulitis 02/2018   Hx of adenomatous polyp of colon 03/21/2014   Hyperlipidemia    Hypertension    IBS (irritable bowel syndrome)    OSA on CPAP 12/07/2016   Pneumonia 1990s X 1   SOB  (shortness of breath)    Vitamin D deficiency     SURGICAL HISTORY: Past Surgical History:  Procedure Laterality Date   APPENDECTOMY  1967   BOTOX INJECTION     "face"   CATARACT EXTRACTION W/ INTRAOCULAR LENS IMPLANT Bilateral 2000s   CESAREAN SECTION  1972; 1975   COLONOSCOPY W/ BIOPSIES AND POLYPECTOMY  "a few"   DILATION AND CURETTAGE OF UTERUS     HYSTEROSCOPY WITH D & C  08/29/2006   and resection of endometrial polyp/notes 09/10/2010   IR IMAGING GUIDED PORT INSERTION  01/18/2021   JOINT REPLACEMENT     KNEE ARTHROSCOPY Bilateral    NASAL SEPTUM SURGERY  1972   OOPHORECTOMY Right 1994   TOTAL KNEE ARTHROPLASTY  08/05/2011   Procedure: TOTAL KNEE ARTHROPLASTY;  Surgeon: Gearlean Alf, MD;  Location: [facility];  Service: Orthopedics;  Laterality: Right;   TOTAL KNEE ARTHROPLASTY Left 05/2009   TUBAL LIGATION  1975    I have reviewed the social history and family history with the patient and they are unchanged from previous note.  ALLERGIES:  has No Known Allergies.  MEDICATIONS:  Current Outpatient Medications  Medication Sig Dispense Refill   albuterol (VENTOLIN HFA) 108 (90 Base) MCG/ACT inhaler Inhale into the lungs every 6 (six) hours as needed for wheezing or shortness of breath.     ALPRAZolam (NIRAVAM) 0.25 MG dissolvable tablet Take 0.25 mg by mouth at bedtime as  needed for anxiety.     losartan (COZAAR) 25 MG tablet Take 25 mg by mouth daily.     metoprolol tartrate (LOPRESSOR) 100 MG tablet Take 1 tablet (100 mg total) by mouth once for 1 dose. Take 90-120 minutes prior to scan. 1 tablet 0   nitroGLYCERIN (NITROSTAT) 0.4 MG SL tablet Dissolve 1 tablet under the tongue every 5 minutes as needed for chest pain. Max of 3 doses, if no relief, call 911. 25 tablet 3   Semaglutide-Weight Management (WEGOVY) 0.25 MG/0.5ML SOAJ Inject 0.25 mg into the skin. (Patient not taking: Reported on 09/21/2021)     sertraline (ZOLOFT) 100 MG tablet Take 100 mg by mouth 2 (two) times a  day.   0   No current facility-administered medications for this visit.    PHYSICAL EXAMINATION:  Vitals:   11/15/21 1450  BP: (!) 145/77  Pulse: 83  Resp: 19  Temp: (!) 97.5 F (36.4 C)  SpO2: 95%   O2 sat 94-96% on room air on ambulation, pulse did not exceed 100 bpm Filed Weights   11/15/21 1450  Weight: 243 lb 14.4 oz (110.6 kg)    Physical Exam Constitutional:      Appearance: Normal appearance.  Cardiovascular:     Rate and Rhythm: Normal rate and regular rhythm.     Pulses: Normal pulses.     Heart sounds: Normal heart sounds.  Pulmonary:     Effort: Pulmonary effort is normal.     Breath sounds: Normal breath sounds.  Abdominal:     General: Abdomen is flat. There is no distension.     Palpations: Abdomen is soft. There is no mass.     Tenderness: There is no abdominal tenderness.  Musculoskeletal:     Cervical back: Normal range of motion and neck supple. No rigidity.  Lymphadenopathy:     Cervical: No cervical adenopathy.  Skin:    Findings: Rash (Blistering rash noted on the abdomen, on the right breast) present.  Neurological:     General: No focal deficit present.     Mental Status: She is alert.      LABORATORY DATA:  I have reviewed the data as listed    Latest Ref Rng & Units 10/04/2021    2:24 PM 09/04/2021    3:17 PM 08/27/2021    1:19 PM  CBC  WBC 4.0 - 10.5 K/uL  5.2  4.4   Hemoglobin 11.1 - 15.9 g/dL 13.7  13.1  13.4   Hematocrit 34.0 - 46.6 % 40.4  36.8  38.1   Platelets 150 - 400 K/uL  189  165         Latest Ref Rng & Units 10/04/2021    2:24 PM 09/04/2021    3:17 PM 08/29/2021    8:45 PM  CMP  Glucose 70 - 99 mg/dL 91  89  98   BUN 8 - 27 mg/dL '21  23  26   '$ Creatinine 0.57 - 1.00 mg/dL 0.90  0.96  0.85   Sodium 134 - 144 mmol/L 138  138  138   Potassium 3.5 - 5.2 mmol/L 4.5  4.0  3.6   Chloride 96 - 106 mmol/L 104  106  106   CO2 20 - 29 mmol/L '25  25  24   '$ Calcium 8.7 - 10.3 mg/dL 9.3  8.7  8.5   Total Protein 6.5 - 8.1  g/dL  6.6    Total Bilirubin 0.3 - 1.2 mg/dL  0.6  Alkaline Phos 38 - 126 U/L  80    AST 15 - 41 U/L  22    ALT 0 - 44 U/L  27        RADIOGRAPHIC STUDIES: I have personally reviewed the radiological images as listed and agreed with the findings in the report. No results found.   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 Target ferritin is below 100.  We have drawn some random urine porphyrin levels, ALT levels and porphobilinogen levels today.  Instructions were sent to the lab to protect urine from light.  We will do a televisit in 4 weeks to review these results and to discuss any additional recommendations. Skin rash, she will continue to follow-up with dermatology periodically.   Age-appropriate cancer screening discussed.  Return to clinic with me in 4 weeks for televisit to review lab results.  Benay Pike, MD 11/15/21

## 2021-11-15 NOTE — Addendum Note (Signed)
Addended by: Adaline Sill on: 11/15/2021 05:06 PM   Modules accepted: Orders

## 2021-11-16 ENCOUNTER — Telehealth: Payer: Self-pay | Admitting: Hematology and Oncology

## 2021-11-16 LAB — FERRITIN: Ferritin: 103 ng/mL (ref 11–307)

## 2021-11-16 NOTE — Telephone Encounter (Signed)
Contacted patient to scheduled appointments. Left message with appointment details and a call back number if patient had any questions or could not accommodate the time we provided.   

## 2021-11-20 LAB — MISC LABCORP TEST (SEND OUT)
Labcorp test code: 3065
Labcorp test code: 7364

## 2021-11-21 ENCOUNTER — Inpatient Hospital Stay: Payer: Medicare Other | Attending: Hematology and Oncology

## 2021-11-21 ENCOUNTER — Other Ambulatory Visit: Payer: Self-pay

## 2021-11-21 VITALS — BP 135/85 | HR 87 | Temp 98.4°F | Resp 17

## 2021-11-21 DIAGNOSIS — Z95828 Presence of other vascular implants and grafts: Secondary | ICD-10-CM

## 2021-11-21 MED ORDER — HEPARIN SOD (PORK) LOCK FLUSH 100 UNIT/ML IV SOLN
500.0000 [IU] | Freq: Once | INTRAVENOUS | Status: AC
  Administered 2021-11-21: 500 [IU]

## 2021-11-21 MED ORDER — SODIUM CHLORIDE 0.9% FLUSH
10.0000 mL | Freq: Once | INTRAVENOUS | Status: AC
  Administered 2021-11-21: 10 mL

## 2021-11-21 NOTE — Progress Notes (Signed)
Phlebotomy initiated at 15:23 via port. Eneded at 16:06 with 500g removed. Patient tolerated well. Offered liquids during and and after procedure. Patient tolerated well and remained for 30 minutes post procedure.

## 2021-11-22 ENCOUNTER — Other Ambulatory Visit: Payer: Self-pay

## 2021-11-22 ENCOUNTER — Ambulatory Visit (HOSPITAL_BASED_OUTPATIENT_CLINIC_OR_DEPARTMENT_OTHER): Payer: Medicare Other | Attending: Orthopedic Surgery | Admitting: Physical Therapy

## 2021-11-22 ENCOUNTER — Encounter (HOSPITAL_BASED_OUTPATIENT_CLINIC_OR_DEPARTMENT_OTHER): Payer: Self-pay | Admitting: Physical Therapy

## 2021-11-22 DIAGNOSIS — M5459 Other low back pain: Secondary | ICD-10-CM | POA: Insufficient documentation

## 2021-11-22 DIAGNOSIS — R262 Difficulty in walking, not elsewhere classified: Secondary | ICD-10-CM | POA: Insufficient documentation

## 2021-11-22 NOTE — Therapy (Signed)
OUTPATIENT PHYSICAL THERAPY THORACOLUMBAR EVALUATION   Patient Name: Michelle [name] MRN: [id number] DOB:January 13, 1946, 76 y.o., female Today's Date: 11/23/2021   PT End of Session - 11/22/21 1353     Visit Number 1    Number of Visits 17    Date for PT Re-Evaluation 02/01/22    Authorization Type [employer] MCR    PT Start Time 1350    PT Stop Time 1430    PT Time Calculation (min) 40 min    Activity Tolerance Patient tolerated treatment well    Behavior During Therapy WFL for tasks assessed/performed             Past Medical History:  Diagnosis Date   Acute hypoxemic respiratory failure ([place]) 04/24/2017   Anxiety    Arthritis    "back" (04/24/2017)   Atherosclerosis of coronary artery    Blurry vision, left eye    "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 depression only   DOE (dyspnea on exertion)    Fatty liver    History of diverticulitis 02/2018   Hx of adenomatous polyp of colon 03/21/2014   Hyperlipidemia    Hypertension    IBS (irritable bowel syndrome)    OSA on CPAP 12/07/2016   Pneumonia 1990s X 1   SOB (shortness of breath)    Vitamin D deficiency    Past Surgical History:  Procedure Laterality Date   APPENDECTOMY  1967   BOTOX INJECTION     "face"   CATARACT EXTRACTION W/ INTRAOCULAR LENS IMPLANT Bilateral 2000s   CESAREAN SECTION  1972; 1975   COLONOSCOPY W/ BIOPSIES AND POLYPECTOMY  "a few"   DILATION AND CURETTAGE OF UTERUS     HYSTEROSCOPY WITH D & C  08/29/2006   and resection of endometrial polyp/notes 09/10/2010   IR IMAGING GUIDED PORT INSERTION  01/18/2021   JOINT REPLACEMENT     KNEE ARTHROSCOPY Bilateral    NASAL SEPTUM SURGERY  1972   OOPHORECTOMY Right 1994   TOTAL KNEE ARTHROPLASTY  08/05/2011   Procedure: TOTAL  KNEE ARTHROPLASTY;  Surgeon: Gearlean Alf, MD;  Location: [facility];  Service: Orthopedics;  Laterality: Right;   TOTAL KNEE ARTHROPLASTY Left 05/2009   TUBAL LIGATION  1975   Patient Active Problem List   Diagnosis Date Noted   Port-A-Cath in place 08/27/2021   Macrocytosis without anemia 08/30/2020   Hereditary hemochromatosis ([place]) 06/19/2020   Cough 05/14/2017   ETOH abuse 04/27/2017   Abnormal LFTs 04/26/2017   Thrombocytopenia ([place]) 04/26/2017   Dyspnea on exertion 03/18/2017   Morbid obesity ([place]) 03/18/2017   Obstructive sleep apnea 12/07/2016   Essential hypertension 12/07/2016   Hx of adenomatous polyp of colon 03/21/2014   Postop Hyponatremia 08/07/2011   OA (osteoarthritis) of knee 08/05/2011   Pain in limb 05/15/2011    REFERRING PROVIDER: Melina Schools, MD  REFERRING DIAG: M54.51 (ICD-10-CM) - Vertebrogenic low back pain  Rationale for Evaluation and Treatment Rehabilitation  THERAPY DIAG:  Other low back pain  Difficulty in walking, not elsewhere classified  ONSET DATE: 6th of July was in pain so bad I was nearly in tears  SUBJECTIVE:  SUBJECTIVE STATEMENT: Points to Rt buttock, more superior aspect. He thinks it is SIJ. I can feel it when laying on my right side in bed. Injection under ultrasound on Monday.   PERTINENT HISTORY:  DDD, OA, DOE, Rt TKA, IBS & Diverticulitis  PAIN:  Are you having pain? Yes: NPRS scale: 5/10 Pain location: Rt buttock Pain description: sharp, ache Aggravating factors: constant Relieving factors: advil   PRECAUTIONS: None  WEIGHT BEARING RESTRICTIONS No  FALLS:  Has patient fallen in last 6 months? No  OCCUPATION: retired  PLOF: Independent  PATIENT GOALS decrease pain, I get so out of breath with activity   OBJECTIVE:    DIAGNOSTIC FINDINGS:  No images  PATIENT SURVEYS:  FOTO 47   COGNITION:  Overall cognitive status: Within functional limits for tasks assessed     SENSATION: WFL  POSTURE: anterior pelvic tilt  PALPATION: Spasm in Rt post hip that created concordant pain  LUMBAR ROM:   Active  A/PROM  eval  Flexion WFL  Extension WFL  Right lateral flexion pain  Left lateral flexion WFL  Right rotation   Left rotation    (Blank rows = not tested)  LOWER EXTREMITY ROM:     Active  Right eval Left eval  Hip flexion    Hip extension    Hip abduction    Hip adduction    Hip internal rotation    Hip external rotation    Knee flexion    Knee extension    Ankle dorsiflexion    Ankle plantarflexion    Ankle inversion    Ankle eversion     (Blank rows = not tested)  LOWER EXTREMITY MMT:    MMT Right eval Left eval  Hip flexion    Hip extension    Hip abduction    Hip adduction    Hip internal rotation    Hip external rotation    Knee flexion    Knee extension    Ankle dorsiflexion    Ankle plantarflexion    Ankle inversion    Ankle eversion     (Blank rows = not tested)   GAIT: Gait WFL    TODAY'S TREATMENT  MANUAL: soft tissue work to Rt hip, also edu on use of tennis ball for self massage Seated HSS Piriformis stretch    PATIENT EDUCATION:  Education details: Anatomy of condition, POC, HEP, exercise form/rationale Person educated: Patient Education method: Explanation, Demonstration, Tactile cues, Verbal cues, and Handouts Education comprehension: verbalized understanding, returned demonstration, verbal cues required, tactile cues required, and needs further education   HOME EXERCISE PROGRAM: BQCMMAME  ASSESSMENT:  CLINICAL IMPRESSION: Patient is a 76 y.o. F who was seen today for physical therapy evaluation and treatment for acute on chronic Rt-sided LBP.    OBJECTIVE IMPAIRMENTS decreased activity tolerance, difficulty walking, decreased  ROM, decreased strength, increased muscle spasms, impaired flexibility, improper body mechanics, postural dysfunction, obesity, and pain.   ACTIVITY LIMITATIONS carrying, lifting, bending, standing, squatting, sleeping, stairs, bed mobility, and locomotion level  PARTICIPATION LIMITATIONS: meal prep, cleaning, laundry, shopping, and community activity  PERSONAL FACTORS Fitness, Time since onset of injury/illness/exacerbation, and 1-2 comorbidities: DOE, DDD  are also affecting patient's functional outcome.   REHAB POTENTIAL: Good  CLINICAL DECISION MAKING: Stable/uncomplicated  EVALUATION COMPLEXITY: Low   GOALS: Goals reviewed with patient? Yes  SHORT TERM GOALS: Target date: 12/14/2021  Decrease in spasm around hip via use of DN/manual therapy Baseline: Goal status: INITIAL   LONG TERM GOALS: Target   date: 02/01/2022  FOTO goal to be met Baseline:  Goal status: INITIAL  2.  Able to demo gross 5/5 hip abd strength Baseline: significant pain at eval, testing unappropriate Goal status: INITIAL  3.  Able to participate in proper strengthening HEP with understanding of rest breaks and control dyspnea Baseline:  Goal status: INITIAL  4.  Able to complete ADLs pain <=2/10 Baseline:  Goal status: INITIAL   PLAN: PT FREQUENCY: 1-2x/week  PT DURATION: 10 weeks  PLANNED INTERVENTIONS: Therapeutic exercises, Therapeutic activity, Neuromuscular re-education, Balance training, Gait training, Patient/Family education, Self Care, Joint mobilization, Stair training, Aquatic Therapy, Dry Needling, Electrical stimulation, Spinal mobilization, Cryotherapy, Moist heat, Taping, Ionotophoresis 4mg/ml Dexamethasone, and Manual therapy.  PLAN FOR NEXT SESSION: DN to hip  Jessica C. Hightower PT, DPT 11/23/21 9:04 PM  

## 2021-11-26 ENCOUNTER — Inpatient Hospital Stay: Admission: RE | Admit: 2021-11-26 | Payer: Medicare Other | Source: Ambulatory Visit

## 2021-11-28 ENCOUNTER — Ambulatory Visit (HOSPITAL_BASED_OUTPATIENT_CLINIC_OR_DEPARTMENT_OTHER): Payer: Medicare Other | Admitting: Physical Therapy

## 2021-11-28 ENCOUNTER — Telehealth (HOSPITAL_COMMUNITY): Payer: Self-pay | Admitting: Emergency Medicine

## 2021-11-28 ENCOUNTER — Telehealth (HOSPITAL_BASED_OUTPATIENT_CLINIC_OR_DEPARTMENT_OTHER): Payer: Self-pay | Admitting: Physical Therapy

## 2021-11-28 DIAGNOSIS — M5459 Other low back pain: Secondary | ICD-10-CM | POA: Diagnosis not present

## 2021-11-28 DIAGNOSIS — R262 Difficulty in walking, not elsewhere classified: Secondary | ICD-10-CM | POA: Diagnosis not present

## 2021-11-28 NOTE — Telephone Encounter (Signed)
Unable to leave vm Ameirah Khatoon RN Navigator Cardiac Imaging [facility] [phone] Office  [phone] Cell  

## 2021-11-28 NOTE — Telephone Encounter (Signed)
Did not arrive for 1515 appointment; called and spoke with patient, she apologized as she had written the time down wrong, had already talked to our front desk and they were able to work her in at [place] (next available opening) with another therapist. Reminded her of next appt time on 8/15.  Ann Lions PT, DPT, PN2   Supplemental Physical Therapist [place]

## 2021-11-28 NOTE — Therapy (Signed)
OUTPATIENT PHYSICAL THERAPY THORACOLUMBAR Treatment    Patient Name: Michelle [name] MRN: [id number] DOB:1945/11/09, 76 y.o., female Today's Date: 11/28/2021     Past Medical History:  Diagnosis Date   Acute hypoxemic respiratory failure ([place]) 04/24/2017   Anxiety    Arthritis    "back" (04/24/2017)   Atherosclerosis of coronary artery    Blurry vision, left eye    "YAG on the left; to be repaired" (04/24/2017)   Chondromalacia of knee    right knee   Chronic bronchitis (HCC)    Chronic lower back pain    DDD (degenerative disc disease) 07/24/2011   Osteoarthritis-s/p LTKA 2'67, now right planned   Depression 07/24/2011   tx. depression only   DOE (dyspnea on exertion)    Fatty liver    History of diverticulitis 02/2018   Hx of adenomatous polyp of colon 03/21/2014   Hyperlipidemia    Hypertension    IBS (irritable bowel syndrome)    OSA on CPAP 12/07/2016   Pneumonia 1990s X 1   SOB (shortness of breath)    Vitamin D deficiency    Past Surgical History:  Procedure Laterality Date   APPENDECTOMY  1967   BOTOX INJECTION     "face"   CATARACT EXTRACTION W/ INTRAOCULAR LENS IMPLANT Bilateral 2000s   CESAREAN SECTION  1972; 1975   COLONOSCOPY W/ BIOPSIES AND POLYPECTOMY  "a few"   DILATION AND CURETTAGE OF UTERUS     HYSTEROSCOPY WITH D & C  08/29/2006   and resection of endometrial polyp/notes 09/10/2010   IR IMAGING GUIDED PORT INSERTION  01/18/2021   JOINT REPLACEMENT     KNEE ARTHROSCOPY Bilateral    NASAL SEPTUM SURGERY  1972   OOPHORECTOMY Right 1994   TOTAL KNEE ARTHROPLASTY  08/05/2011   Procedure: TOTAL KNEE ARTHROPLASTY;  Surgeon: Gearlean Alf, MD;  Location: [facility];  Service: Orthopedics;  Laterality: Right;   TOTAL KNEE ARTHROPLASTY Left 05/2009   TUBAL LIGATION  1975   Patient Active Problem List   Diagnosis Date Noted   Port-A-Cath in place 08/27/2021   Macrocytosis without anemia 08/30/2020   Hereditary hemochromatosis ([place]) 06/19/2020   Cough  05/14/2017   ETOH abuse 04/27/2017   Abnormal LFTs 04/26/2017   Thrombocytopenia ([place]) 04/26/2017   Dyspnea on exertion 03/18/2017   Morbid obesity ([place]) 03/18/2017   Obstructive sleep apnea 12/07/2016   Essential hypertension 12/07/2016   Hx of adenomatous polyp of colon 03/21/2014   Postop Hyponatremia 08/07/2011   OA (osteoarthritis) of knee 08/05/2011   Pain in limb 05/15/2011    REFERRING PROVIDER: Melina Schools, MD  REFERRING DIAG: M54.51 (ICD-10-CM) - Vertebrogenic low back pain  Rationale for Evaluation and Treatment Rehabilitation  THERAPY DIAG:  No diagnosis found.  ONSET DATE: 6th of July was in pain so bad I was nearly in tears  SUBJECTIVE:  SUBJECTIVE STATEMENT: Points to Rt buttock, more superior aspect. He thinks it is SIJ. I can feel it when laying on my right side in bed. Injection under ultrasound on Monday.   PERTINENT HISTORY:  DDD, OA, DOE, Rt TKA, IBS & Diverticulitis  PAIN:  Are you having pain? Yes: NPRS scale: 5/10 Pain location: Rt buttock Pain description: sharp, ache Aggravating factors: constant Relieving factors: advil   PRECAUTIONS: None  WEIGHT BEARING RESTRICTIONS No  FALLS:  Has patient fallen in last 6 months? No  OCCUPATION: retired  PLOF: Independent  PATIENT GOALS decrease pain, I get so out of breath with activity   OBJECTIVE:   DIAGNOSTIC FINDINGS:  No images  PATIENT SURVEYS:  FOTO 47   COGNITION:  Overall cognitive status: Within functional limits for tasks assessed     SENSATION: WFL  POSTURE: anterior pelvic tilt  PALPATION: Spasm in Rt post hip that created concordant pain  LUMBAR ROM:   Active  A/PROM  eval  Flexion WFL  Extension WFL  Right lateral flexion pain  Left lateral flexion WFL  Right rotation    Left rotation    (Blank rows = not tested)  LOWER EXTREMITY ROM:     Active  Right eval Left eval  Hip flexion    Hip extension    Hip abduction    Hip adduction    Hip internal rotation    Hip external rotation    Knee flexion    Knee extension    Ankle dorsiflexion    Ankle plantarflexion    Ankle inversion    Ankle eversion     (Blank rows = not tested)  LOWER EXTREMITY MMT:    MMT Right eval Left eval  Hip flexion    Hip extension    Hip abduction    Hip adduction    Hip internal rotation    Hip external rotation    Knee flexion    Knee extension    Ankle dorsiflexion    Ankle plantarflexion    Ankle inversion    Ankle eversion     (Blank rows = not tested)   GAIT: Gait WFL    TODAY'S TREATMENT  MANUAL: soft tissue work to Rt hip, also edu on use of tennis ball for self massage Seated HSS Piriformis stretch    PATIENT EDUCATION:  Education details: Geophysicist/field seismologist of condition, POC, HEP, exercise form/rationale Person educated: Patient Education method: Consulting civil engineer, Media planner, Corporate treasurer cues, Verbal cues, and Handouts Education comprehension: verbalized understanding, returned demonstration, verbal cues required, tactile cues required, and needs further education   HOME EXERCISE PROGRAM: BQCMMAME  ASSESSMENT:  CLINICAL IMPRESSION: Patient is a 75 y.o. F who was seen today for physical therapy evaluation and treatment for acute on chronic Rt-sided LBP.    OBJECTIVE IMPAIRMENTS decreased activity tolerance, difficulty walking, decreased ROM, decreased strength, increased muscle spasms, impaired flexibility, improper body mechanics, postural dysfunction, obesity, and pain.   ACTIVITY LIMITATIONS carrying, lifting, bending, standing, squatting, sleeping, stairs, bed mobility, and locomotion level  PARTICIPATION LIMITATIONS: meal prep, cleaning, laundry, shopping, and community activity  [place], Time since onset of  injury/illness/exacerbation, and 1-2 comorbidities: DOE, DDD  are also affecting patient's functional outcome.   REHAB POTENTIAL: Good  CLINICAL DECISION MAKING: Stable/uncomplicated  EVALUATION COMPLEXITY: Low   GOALS: Goals reviewed with patient? Yes  SHORT TERM GOALS: Target date: 12/14/2021  Decrease in spasm around hip via use of DN/manual therapy Baseline: Goal status: INITIAL   LONG TERM GOALS: Target  date: 02/01/2022  FOTO goal to be met Baseline:  Goal status: INITIAL  2.  Able to demo gross 5/5 hip abd strength Baseline: significant pain at eval, testing unappropriate Goal status: INITIAL  3.  Able to participate in proper strengthening HEP with understanding of rest breaks and control dyspnea Baseline:  Goal status: INITIAL  4.  Able to complete ADLs pain <=2/10 Baseline:  Goal status: INITIAL   PLAN: PT FREQUENCY: 1-2x/week  PT DURATION: 10 weeks  PLANNED INTERVENTIONS: Therapeutic exercises, Therapeutic activity, Neuromuscular re-education, Balance training, Gait training, Patient/Family education, Self Care, Joint mobilization, Stair training, Aquatic Therapy, Dry Needling, Electrical stimulation, Spinal mobilization, Cryotherapy, Moist heat, Taping, Ionotophoresis 37m/ml Dexamethasone, and Manual therapy.  PLAN FOR NEXT SESSION: DN to hip  Jessica C. Hightower PT, DPT 11/28/21 4:29 PM

## 2021-11-29 ENCOUNTER — Encounter (HOSPITAL_BASED_OUTPATIENT_CLINIC_OR_DEPARTMENT_OTHER): Payer: Self-pay | Admitting: Physical Therapy

## 2021-11-29 ENCOUNTER — Ambulatory Visit (HOSPITAL_COMMUNITY)
Admission: RE | Admit: 2021-11-29 | Discharge: 2021-11-29 | Disposition: A | Discharge status: Home / Self Care | Payer: Medicare Other | Source: Ambulatory Visit | Attending: Cardiovascular Disease | Admitting: Cardiovascular Disease

## 2021-11-29 DIAGNOSIS — R079 Chest pain, unspecified: Secondary | ICD-10-CM | POA: Insufficient documentation

## 2021-11-29 MED ORDER — HEPARIN SOD (PORK) LOCK FLUSH 100 UNIT/ML IV SOLN
500.0000 [IU] | INTRAVENOUS | Status: AC | PRN
  Administered 2021-11-29: 500 [IU]
  Filled 2021-11-29: qty 5

## 2021-11-29 MED ORDER — GADOBUTROL 1 MMOL/ML IV SOLN
14.0000 mL | Freq: Once | INTRAVENOUS | Status: AC | PRN
  Administered 2021-11-29: 14 mL via INTRAVENOUS

## 2021-11-30 ENCOUNTER — Telehealth: Payer: Self-pay | Admitting: Hematology and Oncology

## 2021-11-30 ENCOUNTER — Encounter: Payer: Self-pay | Admitting: Hematology and Oncology

## 2021-11-30 NOTE — Telephone Encounter (Signed)
I called the patient to review the porphyria labs that she wanted [place] to test for.  For some of the labs we do not have a reference range however for the rest, she appears to be well within the reference range.  Since we do not routinely treat porphyria and since this testing was requested by her dermatologist, have recommended that she follow-up with her dermatologist for additional recommendations.  We will also fax a copy of the labs to her dermatologist for review.  She expressed understanding.

## 2021-12-04 ENCOUNTER — Ambulatory Visit (HOSPITAL_BASED_OUTPATIENT_CLINIC_OR_DEPARTMENT_OTHER): Payer: Medicare Other | Admitting: Physical Therapy

## 2021-12-04 ENCOUNTER — Telehealth: Payer: Self-pay | Admitting: *Deleted

## 2021-12-04 ENCOUNTER — Encounter (HOSPITAL_BASED_OUTPATIENT_CLINIC_OR_DEPARTMENT_OTHER): Payer: Self-pay | Admitting: Physical Therapy

## 2021-12-04 DIAGNOSIS — R262 Difficulty in walking, not elsewhere classified: Secondary | ICD-10-CM | POA: Diagnosis not present

## 2021-12-04 DIAGNOSIS — M5459 Other low back pain: Secondary | ICD-10-CM | POA: Diagnosis not present

## 2021-12-04 NOTE — Therapy (Signed)
OUTPATIENT PHYSICAL THERAPY THORACOLUMBAR Treatment    Patient Name: Michelle [name] MRN: [id number] DOB:Aug 18, 1945, 76 y.o., female Today's Date: 12/04/2021   PT End of Session - 12/04/21 1344     Visit Number 3    Number of Visits 17    Date for PT Re-Evaluation 02/01/22    Authorization Type BCBS MCR    PT Start Time 1304    PT Stop Time 1331   time subtracted for DN, not included in biling   PT Time Calculation (min) 27 min    Activity Tolerance Patient tolerated treatment well    Behavior During Therapy WFL for tasks assessed/performed               Past Medical History:  Diagnosis Date   Acute hypoxemic respiratory failure ([place]) 04/24/2017   Anxiety    Arthritis    "back" (04/24/2017)   Atherosclerosis of coronary artery    Blurry vision, left eye    "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 depression only   DOE (dyspnea on exertion)    Fatty liver    History of diverticulitis 02/2018   Hx of adenomatous polyp of colon 03/21/2014   Hyperlipidemia    Hypertension    IBS (irritable bowel syndrome)    OSA on CPAP 12/07/2016   Pneumonia 1990s X 1   SOB (shortness of breath)    Vitamin D deficiency    Past Surgical History:  Procedure Laterality Date   APPENDECTOMY  1967   BOTOX INJECTION     "face"   CATARACT EXTRACTION W/ INTRAOCULAR LENS IMPLANT Bilateral 2000s   CESAREAN SECTION  1972; 1975   COLONOSCOPY W/ BIOPSIES AND POLYPECTOMY  "a few"   DILATION AND CURETTAGE OF UTERUS     HYSTEROSCOPY WITH D & C  08/29/2006   and resection of endometrial polyp/notes 09/10/2010   IR IMAGING GUIDED PORT INSERTION  01/18/2021   JOINT REPLACEMENT     KNEE ARTHROSCOPY Bilateral    NASAL SEPTUM SURGERY  1972   OOPHORECTOMY Right 1994   TOTAL  KNEE ARTHROPLASTY  08/05/2011   Procedure: TOTAL KNEE ARTHROPLASTY;  Surgeon: Gearlean Alf, MD;  Location: [facility];  Service: Orthopedics;  Laterality: Right;   TOTAL KNEE ARTHROPLASTY Left 05/2009   TUBAL LIGATION  1975   Patient Active Problem List   Diagnosis Date Noted   Port-A-Cath in place 08/27/2021   Macrocytosis without anemia 08/30/2020   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   Cough 05/14/2017   ETOH abuse 04/27/2017   Abnormal LFTs 04/26/2017   Thrombocytopenia ([place]) 04/26/2017   Dyspnea on exertion 03/18/2017   Morbid obesity ([place]) 03/18/2017   Obstructive sleep apnea 12/07/2016   Essential hypertension 12/07/2016   Hx of adenomatous polyp of colon 03/21/2014   Postop Hyponatremia 08/07/2011   OA (osteoarthritis) of knee 08/05/2011   Pain in limb 05/15/2011    REFERRING PROVIDER: Melina Schools, MD  REFERRING DIAG: M54.51 (ICD-10-CM) - Vertebrogenic low back pain  Rationale for Evaluation and Treatment Rehabilitation  THERAPY DIAG:  Other low back pain  Difficulty in walking, not elsewhere classified  ONSET DATE: 6th of July was in pain so bad I was nearly in tears  SUBJECTIVE:  SUBJECTIVE STATEMENT: I'm feeling OK, I felt good after last session now my left side is hurting more than the right. I know I've got a lot of muscle spasms.   DDD, OA, DOE, Rt TKA, IBS & Diverticulitis  PAIN:  Are you having pain? Yes: NPRS scale: 4/10 Pain location: R and L  Pain description: sharp, ache Aggravating factors: standing Relieving factors: sitting    PRECAUTIONS: None  WEIGHT BEARING RESTRICTIONS No  FALLS:  Has patient fallen in last 6 months? No  OCCUPATION: retired  PLOF: Independent  PATIENT GOALS decrease pain, I get so out of breath with  activity   OBJECTIVE:   DIAGNOSTIC FINDINGS:  No images  PATIENT SURVEYS:  FOTO 47   COGNITION:  Overall cognitive status: Within functional limits for tasks assessed     SENSATION: WFL  POSTURE: anterior pelvic tilt  PALPATION: Spasm in Rt post hip that created concordant pain  LUMBAR ROM:   Active  A/PROM  eval  Flexion WFL  Extension WFL  Right lateral flexion pain  Left lateral flexion WFL  Right rotation   Left rotation    (Blank rows = not tested)  LOWER EXTREMITY ROM:     Active  Right eval Left eval  Hip flexion    Hip extension    Hip abduction    Hip adduction    Hip internal rotation    Hip external rotation    Knee flexion    Knee extension    Ankle dorsiflexion    Ankle plantarflexion    Ankle inversion    Ankle eversion     (Blank rows = not tested)  LOWER EXTREMITY MMT:    MMT Right eval Left eval  Hip flexion    Hip extension    Hip abduction    Hip adduction    Hip internal rotation    Hip external rotation    Knee flexion    Knee extension    Ankle dorsiflexion    Ankle plantarflexion    Ankle inversion    Ankle eversion     (Blank rows = not tested)   GAIT: Gait WFL    TODAY'S TREATMENT   8/15  Seated marches yellow band 2x15 B Seated clamshells yellow TB 1x15  LAQs yellow TB 1x15 B             Hamstring stretch 2x30 seconds B seated with 4 inch box             Piriformis stretch 2x30 seconds B               Standing 3 way hip x10 B no resistance               DN R glute med/max by certified clinician Carolyne Littles PT DPT), not included in billing     8/10  LTR x20 given because patient reports stiffness in the morning  Gluteal stretch 3x20 sec hold   Seated march yellow band 2x10  Seated laq yellow band 2x10  Seated hip abduction yellow 2x10   Manual: trigger point release to gluteal and lower back;  Grade II and III LAD to improve hip flexion  Posterior glide for flexion grade II and III  with noted improvement in pain free hip flexion after   Eval MANUAL: soft tissue work to Rt hip, also edu on use of tennis ball for self massage Seated HSS Piriformis stretch    PATIENT EDUCATION:  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 Hopefully her mm spasms and cramps will improve from here- we can certainly repeat this again if it was helpful.  OBJECTIVE IMPAIRMENTS decreased activity tolerance, difficulty walking, decreased ROM, decreased strength, increased muscle spasms, impaired flexibility, improper body mechanics, postural dysfunction, obesity, and pain.   ACTIVITY LIMITATIONS carrying, lifting, bending, standing, squatting, sleeping, stairs, bed mobility, and locomotion level  PARTICIPATION LIMITATIONS: meal prep, cleaning, laundry, shopping, and community activity  [place], Time since onset of injury/illness/exacerbation, and 1-2 comorbidities: DOE, DDD  are also affecting patient's functional outcome.   REHAB POTENTIAL: Good  CLINICAL DECISION MAKING: Stable/uncomplicated  EVALUATION COMPLEXITY: Low   GOALS: Goals reviewed with patient? Yes  SHORT TERM GOALS: Target date: 12/14/2021  Decrease in spasm around hip via use of DN/manual therapy Baseline: Goal status: INITIAL   LONG TERM GOALS: Target date: 02/01/2022  FOTO goal to be met Baseline:  Goal status:  INITIAL  2.  Able to demo gross 5/5 hip abd strength Baseline: significant pain at eval, testing unappropriate Goal status: INITIAL  3.  Able to participate in proper strengthening HEP with understanding of rest breaks and control dyspnea Baseline:  Goal status: INITIAL  4.  Able to complete ADLs pain <=2/10 Baseline:  Goal status: INITIAL   PLAN: PT FREQUENCY: 1-2x/week  PT DURATION: 10 weeks  PLANNED INTERVENTIONS: Therapeutic exercises, Therapeutic activity, Neuromuscular re-education, Balance training, Gait training, Patient/Family education, Self Care, Joint mobilization, Stair training, Aquatic Therapy, Dry Needling, Electrical stimulation, Spinal mobilization, Cryotherapy, Moist heat, Taping, Ionotophoresis 64m/ml Dexamethasone, and Manual therapy.  PLAN FOR NEXT SESSION: repeat DN to hip? How did she feel? consider standing series of exercises; review tolerance to stretches and seated exercises.    KAnn LionsPT DPT PN2  12/04/2021, 1:45 PM

## 2021-12-04 NOTE — Telephone Encounter (Signed)
This RN faxed labs and MD notes to [facility] at [phone]

## 2021-12-10 ENCOUNTER — Encounter (HOSPITAL_BASED_OUTPATIENT_CLINIC_OR_DEPARTMENT_OTHER): Payer: Self-pay | Admitting: Physical Therapy

## 2021-12-10 ENCOUNTER — Ambulatory Visit (HOSPITAL_BASED_OUTPATIENT_CLINIC_OR_DEPARTMENT_OTHER): Payer: Medicare Other | Admitting: Physical Therapy

## 2021-12-10 DIAGNOSIS — R262 Difficulty in walking, not elsewhere classified: Secondary | ICD-10-CM

## 2021-12-10 DIAGNOSIS — M5459 Other low back pain: Secondary | ICD-10-CM

## 2021-12-10 LAB — PORPHYRINS, FRACTIONATION-PLASMA
Coproporphyrin.: <1 ug/dL (ref <1.0)
Heptacarboxyl Porphyrins: <1 ug/dL (ref <1.0)
Hexacarboxyl Porphyrins: <1.5 ug/dL (ref <1.5)
Pentacarboxyl Porphyrins: <1 ug/dL (ref <1.0)
Protoporphyrin: <1.5 ug/dL (ref <1.5)
Uroporphyrin: <1.5 ug/dL (ref <1.5)

## 2021-12-10 NOTE — Therapy (Signed)
OUTPATIENT PHYSICAL THERAPY THORACOLUMBAR Treatment    Patient Name: Michelle [name] MRN: [id number] DOB:08-Feb-1946, 76 y.o., female Today's Date: 12/10/2021   PT End of Session - 12/10/21 1658     Visit Number 4    Number of Visits 17    Date for PT Re-Evaluation 02/01/22    Authorization Type BCBS MCR    PT Start Time 1656    PT Stop Time 1735    PT Time Calculation (min) 39 min    Activity Tolerance Patient tolerated treatment well    Behavior During Therapy WFL for tasks assessed/performed                Past Medical History:  Diagnosis Date   Acute hypoxemic respiratory failure ([place]) 04/24/2017   Anxiety    Arthritis    "back" (04/24/2017)   Atherosclerosis of coronary artery    Blurry vision, left eye    "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 depression only   DOE (dyspnea on exertion)    Fatty liver    History of diverticulitis 02/2018   Hx of adenomatous polyp of colon 03/21/2014   Hyperlipidemia    Hypertension    IBS (irritable bowel syndrome)    OSA on CPAP 12/07/2016   Pneumonia 1990s X 1   SOB (shortness of breath)    Vitamin D deficiency    Past Surgical History:  Procedure Laterality Date   APPENDECTOMY  1967   BOTOX INJECTION     "face"   CATARACT EXTRACTION W/ INTRAOCULAR LENS IMPLANT Bilateral 2000s   CESAREAN SECTION  1972; 1975   COLONOSCOPY W/ BIOPSIES AND POLYPECTOMY  "a few"   DILATION AND CURETTAGE OF UTERUS     HYSTEROSCOPY WITH D & C  08/29/2006   and resection of endometrial polyp/notes 09/10/2010   IR IMAGING GUIDED PORT INSERTION  01/18/2021   JOINT REPLACEMENT     KNEE ARTHROSCOPY Bilateral    NASAL SEPTUM SURGERY  1972   OOPHORECTOMY Right 1994   TOTAL KNEE ARTHROPLASTY  08/05/2011   Procedure:  TOTAL KNEE ARTHROPLASTY;  Surgeon: Gearlean Alf, MD;  Location: [facility];  Service: Orthopedics;  Laterality: Right;   TOTAL KNEE ARTHROPLASTY Left 05/2009   TUBAL LIGATION  1975   Patient Active Problem List   Diagnosis Date Noted   Port-A-Cath in place 08/27/2021   Macrocytosis without anemia 08/30/2020   Hereditary hemochromatosis ([place]) 06/19/2020   Cough 05/14/2017   ETOH abuse 04/27/2017   Abnormal LFTs 04/26/2017   Thrombocytopenia ([place]) 04/26/2017   Dyspnea on exertion 03/18/2017   Morbid obesity ([place]) 03/18/2017   Obstructive sleep apnea 12/07/2016   Essential hypertension 12/07/2016   Hx of adenomatous polyp of colon 03/21/2014   Postop Hyponatremia 08/07/2011   OA (osteoarthritis) of knee 08/05/2011   Pain in limb 05/15/2011    REFERRING PROVIDER: Melina Schools, MD  REFERRING DIAG: M54.51 (ICD-10-CM) - Vertebrogenic low back pain  Rationale for Evaluation and Treatment Rehabilitation  THERAPY DIAG:  Other low back pain  Difficulty in walking, not elsewhere classified  ONSET DATE: 6th of July was in pain so bad I was nearly in tears  SUBJECTIVE:  SUBJECTIVE STATEMENT: I felt like a new person after dry needling.  I still get the cramping in bed.    PERTINENT HISTORY: DDD, OA, DOE, Rt TKA, IBS & Diverticulitis  PAIN:  Are you having pain? Yes: NPRS scale: 6.5/10 Pain location: Lt low back Pain description:  ache Aggravating factors: standing Relieving factors: sitting    PRECAUTIONS: None  WEIGHT BEARING RESTRICTIONS No  FALLS:  Has patient fallen in last 6 months? No  OCCUPATION: retired  PLOF: Independent  PATIENT GOALS decrease pain, I get so out of breath with activity   OBJECTIVE:   PATIENT SURVEYS:  FOTO 47   POSTURE: anterior pelvic  tilt  PALPATION: Spasm in Rt post hip that created concordant pain  LUMBAR ROM:   Active  A/PROM  eval  Flexion WFL  Extension WFL  Right lateral flexion pain  Left lateral flexion WFL  Right rotation   Left rotation    (Blank rows = not tested)  LOWER EXTREMITY ROM:     Active  Right eval Left eval  Hip flexion    Hip extension    Hip abduction    Hip adduction    Hip internal rotation    Hip external rotation    Knee flexion    Knee extension    Ankle dorsiflexion    Ankle plantarflexion    Ankle inversion    Ankle eversion     (Blank rows = not tested)  LOWER EXTREMITY MMT:    MMT Right 8/21 Left 8/21  Hip flexion    Hip extension    Hip abduction 4 3+  Hip adduction    Hip internal rotation    Hip external rotation    Knee flexion    Knee extension    Ankle dorsiflexion    Ankle plantarflexion    Ankle inversion    Ankle eversion     (Blank rows = not tested)   GAIT: Gait [facility]    TODAY'S TREATMENT   8/21  Trigger Point Dry Needling, Manual Therapy Treatment:  Initial or subsequent education regarding Trigger Point Dry Needling: Subsequent Did patient give consent to treatment with Trigger Point Dry Needling: Yes TPDN with skilled palpation and monitoring followed by STM to the following muscles: Rt glut med/min  Breathing mechanics- while moving in bed & sitting in chair: lower rib cage expansion, reduce extension force, relax jaw    8/15  Seated marches yellow band 2x15 B Seated clamshells yellow TB 1x15  LAQs yellow TB 1x15 B             Hamstring stretch 2x30 seconds B seated with 4 inch box             Piriformis stretch 2x30 seconds B               Standing 3 way hip x10 B no resistance               DN R glute med/max by certified clinician Carolyne Littles PT DPT), not included in billing     8/10  LTR x20 given because patient reports stiffness in the morning  Gluteal stretch 3x20 sec hold   Seated march yellow band  2x10  Seated laq yellow band 2x10  Seated hip abduction yellow 2x10   Manual: trigger point release to gluteal and lower back;  Grade II and III LAD to improve hip flexion  Posterior glide for flexion grade II and III with noted improvement in pain  free hip flexion after   Eval MANUAL: soft tissue work to Rt hip, also edu on use of tennis ball for self massage Seated HSS Piriformis stretch    PATIENT EDUCATION:  Education details: Solicitor Person educated: Patient Education method: Explanation, Demonstration, Tactile cues, Verbal cues, and Handouts Education comprehension: verbalized understanding, returned demonstration, verbal cues required, tactile cues required, and needs further education   HOME EXERCISE PROGRAM: BQCMMAME  ASSESSMENT:  CLINICAL IMPRESSION:  Tendency toward neck breathing creating post chain tension. Worked on this today which resulted in ease of standing from a chair compared to previous attempts.   OBJECTIVE IMPAIRMENTS decreased activity tolerance, difficulty walking, decreased ROM, decreased strength, increased muscle spasms, impaired flexibility, improper body mechanics, postural dysfunction, obesity, and pain.   ACTIVITY LIMITATIONS carrying, lifting, bending, standing, squatting, sleeping, stairs, bed mobility, and locomotion level  PARTICIPATION LIMITATIONS: meal prep, cleaning, laundry, shopping, and community activity  [place], Time since onset of injury/illness/exacerbation, and 1-2 comorbidities: DOE, DDD  are also affecting patient's functional outcome.   REHAB POTENTIAL: Good  CLINICAL DECISION MAKING: Stable/uncomplicated  EVALUATION COMPLEXITY: Low   GOALS: Goals reviewed with patient? Yes  SHORT TERM GOALS: Target date: 12/14/2021  Decrease in spasm around hip via use of DN/manual therapy Baseline: Goal status: INITIAL   LONG TERM GOALS: Target date: 02/01/2022  FOTO goal to be met Baseline:   Goal status: INITIAL  2.  Able to demo gross 5/5 hip abd strength Baseline: significant pain at eval, testing unappropriate Goal status: INITIAL  3.  Able to participate in proper strengthening HEP with understanding of rest breaks and control dyspnea Baseline:  Goal status: INITIAL  4.  Able to complete ADLs pain <=2/10 Baseline:  Goal status: INITIAL   PLAN: PT FREQUENCY: 1-2x/week  PT DURATION: 10 weeks  PLANNED INTERVENTIONS: Therapeutic exercises, Therapeutic activity, Neuromuscular re-education, Balance training, Gait training, Patient/Family education, Self Care, Joint mobilization, Stair training, Aquatic Therapy, Dry Needling, Electrical stimulation, Spinal mobilization, Cryotherapy, Moist heat, Taping, Ionotophoresis 33m/ml Dexamethasone, and Manual therapy.  PLAN FOR NEXT SESSION: DN prn, review breathing mechanics- add to functional activities such as squatting, bending etc.    Arietta Eisenstein C. Jabria Loos PT, DPT 12/10/21 5:36 PM

## 2021-12-12 ENCOUNTER — Ambulatory Visit: Payer: Medicare Other | Admitting: Pulmonary Disease

## 2021-12-12 ENCOUNTER — Encounter: Payer: Self-pay | Admitting: Pulmonary Disease

## 2021-12-12 VITALS — BP 120/80 | HR 73 | Ht 61.0 in | Wt 241.6 lb

## 2021-12-12 DIAGNOSIS — J4520 Mild intermittent asthma, uncomplicated: Secondary | ICD-10-CM | POA: Diagnosis not present

## 2021-12-12 DIAGNOSIS — R0609 Other forms of dyspnea: Secondary | ICD-10-CM

## 2021-12-12 DIAGNOSIS — E669 Obesity, unspecified: Secondary | ICD-10-CM

## 2021-12-12 DIAGNOSIS — G4733 Obstructive sleep apnea (adult) (pediatric): Secondary | ICD-10-CM | POA: Diagnosis not present

## 2021-12-12 DIAGNOSIS — G4730 Sleep apnea, unspecified: Secondary | ICD-10-CM

## 2021-12-12 MED ORDER — ALBUTEROL SULFATE HFA 108 (90 BASE) MCG/ACT IN AERS: 2.0000 | INHALATION_SPRAY | Freq: Four times a day (QID) | RESPIRATORY_TRACT | 5 refills | Status: DC | PRN

## 2021-12-12 NOTE — Progress Notes (Signed)
[facility]  Chief Complaint  Patient presents with   Consult    Consult: Follow-up from Dr Shearon Stalls. SOB    Past Surgical History:  She  has a past surgical history that includes Cesarean section (1856; 1975); Oophorectomy (Right, 1994); Total knee arthroplasty (08/05/2011); Total knee arthroplasty (Left, 05/2009); Joint replacement; Cataract extraction w/ intraocular lens implant (Bilateral, 2000s); Hysteroscopy with D & C (08/29/2006); Dilation and curettage of uterus; Appendectomy (1967); Knee arthroscopy (Bilateral); Nasal septum surgery (1972); Botox injection; Tubal ligation (1975); Colonoscopy w/ biopsies and polypectomy ("a few"); and IR IMAGING GUIDED PORT INSERTION (01/18/2021).  Past Medical History:  Anxiety, CAD, Depression, Back pain, Fatty liver, Diverticulitis, Colon polyp, HLD, HTN, IBS, Pneumonia, Vit D deficiency, Hemochromatosis, Porphyria cutanea tarda  Constitutional:  BP 120/80 (BP Location: Right Arm)   Pulse 73   Ht '5\' 1"'$  (1.549 m)   Wt 241 lb 9.6 oz (109.6 kg)   SpO2 96%   BMI 45.65 kg/m   Brief Summary:  Michelle [name] is a 76 y.o. female with obstructive sleep apnea, asthma, and dyspnea.      Subjective:   She gets winded with walking.  Intermittently gets cough and chest congestion.  Will wheeze sometimes also.  Uses albuterol and this helps.    Uses CPAP nightly.  Has nasal pillows mask.  No issue with mask fit.  She is waiting to hear about cardiac MRI results.  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  She has been doing dry needling for back spasm, and this has helped  Physical Exam:   Appearance - well kempt   ENMT - no sinus tenderness, no oral exudate, no LAN, Mallampati 3 airway, no stridor  Respiratory - equal breath sounds bilaterally, no wheezing or rales  CV - s1s2 regular rate and rhythm, no murmurs  Ext - no clubbing, no edema  Skin - no rashes  Psych - normal mood  and affect   Pulmonary testing:  PFT 03/27/21 >> FEV1 1.74 (91%), FEV1% 85, TLC 3.11 (67%), DLCO 69%  Chest Imaging:  CT angio chest 08/30/21 >> atherosclerosis, atelectasis  Sleep Tests:  PSG 11/03/16 >> AHI 27, SpO2 low 82% CPAP 01/08/17 >> CPAP 14 cm H2O, 2 liters O2 >> AHI 3.2  Cardiac Tests:  Echo 02/26/21 >> EF 55 to 60%, grade 1 DD  Social History:  She  reports that she quit smoking about 7 years ago. Her smoking use included cigarettes. She has a 4.32 pack-year smoking history. She has never used smokeless tobacco. She reports current alcohol use of about 21.0 standard drinks of alcohol per week. She reports that she does not use drugs.  Family History:  Her family history includes Heart disease in her father and mother; Hypertension in her sister; Pulmonary embolism in her mother; Stroke in her father.     Assessment/Plan:   Obstructive sleep apnea. - she is compliant with CPAP and reports benefit from therapy - uses [employer] for her DME - her current CPAP is more than 74 yrs old - will arrange for new [employer] CPAP 5 to 15 cm H2O  Mild, intermittent asthma. - prn albuterol  Dyspnea on exertion. - likely from deconditioning with obesity - continue exercise regimen - f/u with cardiology  Hereditary hemochromatosis. - followed by Dr. Benay Pike with hematology  Time Spent Involved in Patient Care on Day of Examination:  37 minutes  Follow up:   Patient Instructions  Will arrange for new CPAP machine  Follow up in 4 months  Medication List:   Allergies as of 12/12/2021   No Known Allergies      Medication List        Accurate as of December 12, 2021  3:16 PM. If you have any questions, ask your nurse or doctor.          STOP taking these medications    metoprolol tartrate 100 MG tablet Commonly known as: Lopressor Stopped by: Chesley Mires, MD       TAKE these medications    albuterol 108 (90 Base) MCG/ACT inhaler Commonly known as:  VENTOLIN HFA Inhale 2 puffs into the lungs every 6 (six) hours as needed for wheezing or shortness of breath. What changed: how much to take Changed by: Chesley Mires, MD   ALPRAZolam 0.25 MG dissolvable tablet Commonly known as: NIRAVAM Take 0.25 mg by mouth at bedtime as needed for anxiety.   losartan 25 MG tablet Commonly known as: COZAAR Take 25 mg by mouth daily.   nitroGLYCERIN 0.4 MG SL tablet Commonly known as: NITROSTAT Dissolve 1 tablet under the tongue every 5 minutes as needed for chest pain. Max of 3 doses, if no relief, call 911.   sertraline 100 MG tablet Commonly known as: ZOLOFT Take 100 mg by mouth 2 (two) times a day.        Signature:  Chesley Mires, MD [place] Pager - [phone] 12/12/2021, 3:16 PM

## 2021-12-12 NOTE — Patient Instructions (Signed)
Will arrange for new CPAP machine  Follow up in 4 months 

## 2021-12-13 ENCOUNTER — Encounter (HOSPITAL_BASED_OUTPATIENT_CLINIC_OR_DEPARTMENT_OTHER): Payer: Medicare Other | Admitting: Physical Therapy

## 2021-12-13 ENCOUNTER — Encounter (HOSPITAL_BASED_OUTPATIENT_CLINIC_OR_DEPARTMENT_OTHER): Payer: Self-pay

## 2021-12-18 ENCOUNTER — Encounter: Payer: Self-pay | Admitting: Hematology and Oncology

## 2021-12-18 ENCOUNTER — Inpatient Hospital Stay (HOSPITAL_BASED_OUTPATIENT_CLINIC_OR_DEPARTMENT_OTHER): Payer: Medicare Other | Admitting: Hematology and Oncology

## 2021-12-18 NOTE — Progress Notes (Signed)
Michelle [name]   [phone] Fax:([phone]) [phone]   Clinic Follow up Note   Patient Care Team: Jonathon Jordan, MD as PCP - General (Family Medicine) Nahser, Wonda Cheng, MD as PCP - Cardiology (Cardiology) Gaynelle Arabian, MD (Orthopedic Surgery) Date of Service: 02/19/21   CHIEF COMPLAINT: Follow up hereditary hemochromatosis   CURRENT THERAPY: therapeutic phlebotomy if HCT >36 and to maintain ferritin < 500, recently done q2 weeks starting 01/09/21  INTERVAL HISTORY:   Michelle [name] returns for follow up and phlebotomy as scheduled.  She spoke to her dermatologist who suggested a hematologist in [facility] who specializes in porphyria. She has been doing phebotomy monthly, working with PT at [facility].  Rest of the pertinent 10 point ROS reviewed and negative.  MEDICAL HISTORY:  Past Medical History:  Diagnosis Date   Acute hypoxemic respiratory failure ([place]) 04/24/2017   Anxiety    Arthritis    "back" (04/24/2017)   Atherosclerosis of coronary artery    Blurry vision, left eye    "YAG on the left; to be repaired" (04/24/2017)   Chondromalacia of knee    right knee   Chronic bronchitis (HCC)    Chronic lower back pain    DDD (degenerative disc disease) 07/24/2011   Osteoarthritis-s/p LTKA 6'14, now right planned   Depression 07/24/2011   tx. depression only   DOE (dyspnea on exertion)    Fatty liver    History of diverticulitis 02/2018   Hx of adenomatous polyp of colon 03/21/2014   Hyperlipidemia    Hypertension    IBS (irritable bowel syndrome)    OSA on CPAP 12/07/2016   Pneumonia 1990s X 1   SOB (shortness of breath)    Vitamin D deficiency     SURGICAL HISTORY: Past Surgical History:  Procedure Laterality Date   APPENDECTOMY  1967   BOTOX INJECTION     "face"   CATARACT EXTRACTION W/ INTRAOCULAR LENS IMPLANT Bilateral 2000s   CESAREAN SECTION  1972; 1975   COLONOSCOPY W/ BIOPSIES AND POLYPECTOMY  "a few"   DILATION AND CURETTAGE OF UTERUS      HYSTEROSCOPY WITH D & C  08/29/2006   and resection of endometrial polyp/notes 09/10/2010   IR IMAGING GUIDED PORT INSERTION  01/18/2021   JOINT REPLACEMENT     KNEE ARTHROSCOPY Bilateral    NASAL SEPTUM SURGERY  1972   OOPHORECTOMY Right 1994   TOTAL KNEE ARTHROPLASTY  08/05/2011   Procedure: TOTAL KNEE ARTHROPLASTY;  Surgeon: Gearlean Alf, MD;  Location: [facility];  Service: Orthopedics;  Laterality: Right;   TOTAL KNEE ARTHROPLASTY Left 05/2009   TUBAL LIGATION  1975    I have reviewed the social history and family history with the patient and they are unchanged from previous note.  ALLERGIES:  has No Known Allergies.  MEDICATIONS:  Current Outpatient Medications  Medication Sig Dispense Refill   albuterol (VENTOLIN HFA) 108 (90 Base) MCG/ACT inhaler Inhale 2 puffs into the lungs every 6 (six) hours as needed for wheezing or shortness of breath. 8 g 5   ALPRAZolam (NIRAVAM) 0.25 MG dissolvable tablet Take 0.25 mg by mouth at bedtime as needed for anxiety.     losartan (COZAAR) 25 MG tablet Take 25 mg by mouth daily.     nitroGLYCERIN (NITROSTAT) 0.4 MG SL tablet Dissolve 1 tablet under the tongue every 5 minutes as needed for chest pain. Max of 3 doses, if no relief, call 911. 25 tablet 3   sertraline (ZOLOFT) 100  MG tablet Take 100 mg by mouth 2 (two) times a day.   0   No current facility-administered medications for this visit.    PHYSICAL EXAMINATION:  There were no vitals filed for this visit.  O2 sat 94-96% on room air on ambulation, pulse did not exceed 100 bpm There were no vitals filed for this visit.  VS and PE not done, telephone follow up  LABORATORY DATA:  I have reviewed the data as listed    Latest Ref Rng & Units 11/15/2021    3:24 PM 10/04/2021    2:24 PM 09/04/2021    3:17 PM  CBC  WBC 4.0 - 10.5 K/uL 5.8   5.2   Hemoglobin 12.0 - 15.0 g/dL 14.5  13.7  13.1   Hematocrit 36.0 - 46.0 % 40.1  40.4  36.8   Platelets 150 - 400 K/uL 192   189          Latest Ref Rng & Units 11/15/2021    3:24 PM 10/04/2021    2:24 PM 09/04/2021    3:17 PM  CMP  Glucose 70 - 99 mg/dL 100  91  89   BUN 8 - 23 mg/dL 24  21  23    Creatinine 0.44 - 1.00 mg/dL 1.10  0.90  0.96   Sodium 135 - 145 mmol/L 140  138  138   Potassium 3.5 - 5.1 mmol/L 3.8  4.5  4.0   Chloride 98 - 111 mmol/L 107  104  106   CO2 22 - 32 mmol/L 24  25  25    Calcium 8.9 - 10.3 mg/dL 9.0  9.3  8.7   Total Protein 6.5 - 8.1 g/dL 7.2   6.6   Total Bilirubin 0.3 - 1.2 mg/dL 0.7   0.6   Alkaline Phos 38 - 126 U/L 74   80   AST 15 - 41 U/L 29   22   ALT 0 - 44 U/L 37   27       RADIOGRAPHIC STUDIES: I have personally reviewed the radiological images as listed and agreed with the findings in the report. No results found.   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 Target ferritin is below 100.  We have drawn some random urine porphyrin levels, ALT levels and porphobilinogen levels today.  Instructions were sent to the lab to protect urine from light.   Skin rash, she will continue to follow-up with dermatology periodically.   Age-appropriate cancer screening discussed.  She is here for telephone visit and to review the recommendations after she had a conversation with her dermatologist regarding her porphyria.  We had some urine porphyria labs and serum porphyria labs drawn last time she was here however we do not have a reference range to interpret and since this is a very rare condition, have discussed about going [facility] see hematology specialist for porphyria. She has a referral which has been approved, she does not have an appointment scheduled.   In the interim, we will continue phlebotomy monthly as long as the parameters are met.  I connected with  Michelle [name] on 12/18/21 by a telephone application and verified that I am speaking with the correct person using two identifiers.   I discussed the limitations of evaluation and management by telemedicine. The patient  expressed understanding and agreed to proceed.  Return to clinic with me in 3 to 4 months.

## 2021-12-19 ENCOUNTER — Encounter (HOSPITAL_BASED_OUTPATIENT_CLINIC_OR_DEPARTMENT_OTHER): Payer: Self-pay | Admitting: Physical Therapy

## 2021-12-19 ENCOUNTER — Ambulatory Visit (HOSPITAL_BASED_OUTPATIENT_CLINIC_OR_DEPARTMENT_OTHER): Payer: Medicare Other | Admitting: Physical Therapy

## 2021-12-19 DIAGNOSIS — R262 Difficulty in walking, not elsewhere classified: Secondary | ICD-10-CM

## 2021-12-19 DIAGNOSIS — M5459 Other low back pain: Secondary | ICD-10-CM

## 2021-12-19 NOTE — Therapy (Signed)
OUTPATIENT PHYSICAL THERAPY THORACOLUMBAR Treatment    Patient Name: Michelle [name] MRN: [id number] DOB:1945-06-01, 76 y.o., female Today's Date: 12/19/2021   PT End of Session - 12/19/21 1232     Visit Number 5    Number of Visits 17    Date for PT Re-Evaluation 02/01/22    Authorization Type BCBS MCR    PT Start Time 1231    PT Stop Time 1325    PT Time Calculation (min) 54 min    Activity Tolerance Patient tolerated treatment well    Behavior During Therapy WFL for tasks assessed/performed                Past Medical History:  Diagnosis Date   Acute hypoxemic respiratory failure ([place]) 04/24/2017   Anxiety    Arthritis    "back" (04/24/2017)   Atherosclerosis of coronary artery    Blurry vision, left eye    "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 depression only   DOE (dyspnea on exertion)    Fatty liver    History of diverticulitis 02/2018   Hx of adenomatous polyp of colon 03/21/2014   Hyperlipidemia    Hypertension    IBS (irritable bowel syndrome)    OSA on CPAP 12/07/2016   Pneumonia 1990s X 1   SOB (shortness of breath)    Vitamin D deficiency    Past Surgical History:  Procedure Laterality Date   APPENDECTOMY  1967   BOTOX INJECTION     "face"   CATARACT EXTRACTION W/ INTRAOCULAR LENS IMPLANT Bilateral 2000s   CESAREAN SECTION  1972; 1975   COLONOSCOPY W/ BIOPSIES AND POLYPECTOMY  "a few"   DILATION AND CURETTAGE OF UTERUS     HYSTEROSCOPY WITH D & C  08/29/2006   and resection of endometrial polyp/notes 09/10/2010   IR IMAGING GUIDED PORT INSERTION  01/18/2021   JOINT REPLACEMENT     KNEE ARTHROSCOPY Bilateral    NASAL SEPTUM SURGERY  1972   OOPHORECTOMY Right 1994   TOTAL KNEE ARTHROPLASTY  08/05/2011   Procedure:  TOTAL KNEE ARTHROPLASTY;  Surgeon: Gearlean Alf, MD;  Location: [facility];  Service: Orthopedics;  Laterality: Right;   TOTAL KNEE ARTHROPLASTY Left 05/2009   TUBAL LIGATION  1975   Patient Active Problem List   Diagnosis Date Noted   Port-A-Cath in place 08/27/2021   Macrocytosis without anemia 08/30/2020   Hereditary hemochromatosis ([place]) 06/19/2020   Cough 05/14/2017   ETOH abuse 04/27/2017   Abnormal LFTs 04/26/2017   Thrombocytopenia ([place]) 04/26/2017   Dyspnea on exertion 03/18/2017   Morbid obesity ([place]) 03/18/2017   Obstructive sleep apnea 12/07/2016   Essential hypertension 12/07/2016   Hx of adenomatous polyp of colon 03/21/2014   Postop Hyponatremia 08/07/2011   OA (osteoarthritis) of knee 08/05/2011   Pain in limb 05/15/2011    REFERRING PROVIDER: Melina Schools, MD  REFERRING DIAG: M54.51 (ICD-10-CM) - Vertebrogenic low back pain  Rationale for Evaluation and Treatment Rehabilitation  THERAPY DIAG:  Other low back pain  Difficulty in walking, not elsewhere classified  ONSET DATE: 6th of July was in pain so bad I was nearly in tears  SUBJECTIVE:  SUBJECTIVE STATEMENT: The needling is the best thing ever. Just my neck is botehring me today.    PERTINENT HISTORY: DDD, OA, DOE, Rt TKA, IBS & Diverticulitis  PAIN:  Are you having pain? Yes: NPRS scale: 0/10 Pain location: Lt low back Pain description:  ache Aggravating factors: standing Relieving factors: sitting    PRECAUTIONS: None  WEIGHT BEARING RESTRICTIONS No  FALLS:  Has patient fallen in last 6 months? No  OCCUPATION: retired  PLOF: Independent  PATIENT GOALS decrease pain, I get so out of breath with activity   OBJECTIVE:   PATIENT SURVEYS:  FOTO 47   POSTURE: anterior pelvic  tilt  PALPATION: Spasm in Rt post hip that created concordant pain  LUMBAR ROM:   Active  A/PROM  eval  Flexion WFL  Extension WFL  Right lateral flexion pain  Left lateral flexion WFL  Right rotation   Left rotation    (Blank rows = not tested)  LOWER EXTREMITY ROM:     Active  Right eval Left eval  Hip flexion    Hip extension    Hip abduction    Hip adduction    Hip internal rotation    Hip external rotation    Knee flexion    Knee extension    Ankle dorsiflexion    Ankle plantarflexion    Ankle inversion    Ankle eversion     (Blank rows = not tested)  LOWER EXTREMITY MMT:    MMT Right 8/21 Left 8/21  Hip flexion    Hip extension    Hip abduction 4 3+  Hip adduction    Hip internal rotation    Hip external rotation    Knee flexion    Knee extension    Ankle dorsiflexion    Ankle plantarflexion    Ankle inversion    Ankle eversion     (Blank rows = not tested)    TODAY'S TREATMENT   8/30  MANUAL: STM bil upper trap & levator, AP Lt thoracic spine, inf mobs to Lt first rib Upper trap stretch Deep breathing with march, with LAQ Sidelying clams paired with breathing Moist heat- 10 min end of session   8/21  Trigger Point Dry Needling, Manual Therapy Treatment:  Initial or subsequent education regarding Trigger Point Dry Needling: Subsequent Did patient give consent to treatment with Trigger Point Dry Needling: Yes TPDN with skilled palpation and monitoring followed by STM to the following muscles: Rt glut med/min  Breathing mechanics- while moving in bed & sitting in chair: lower rib cage expansion, reduce extension force, relax jaw     PATIENT EDUCATION:  Education details: Solicitor Person educated: Patient Education method: Explanation, Demonstration, Tactile cues, Verbal cues, and Handouts Education comprehension: verbalized understanding, returned demonstration, verbal cues required, tactile cues required, and needs  further education   HOME EXERCISE PROGRAM: BQCMMAME  ASSESSMENT:  CLINICAL IMPRESSION:  Continued tendency toward cervical musculature assist in breathing but is more aware of this. Manual therapy required to reduce pain and improve Rt cervical sidebend for mobility without pain. Cues to completely exhale as she was partially doing so and tightening through cervical and lumbar region for support of movement.   OBJECTIVE IMPAIRMENTS decreased activity tolerance, difficulty walking, decreased ROM, decreased strength, increased muscle spasms, impaired flexibility, improper body mechanics, postural dysfunction, obesity, and pain.   ACTIVITY LIMITATIONS carrying, lifting, bending, standing, squatting, sleeping, stairs, bed mobility, and locomotion level  PARTICIPATION LIMITATIONS: meal prep, cleaning, laundry, shopping, and community  activity  PERSONAL FACTORS Fitness, Time since onset of injury/illness/exacerbation, and 1-2 comorbidities: DOE, DDD  are also affecting patient's functional outcome.   REHAB POTENTIAL: Good  CLINICAL DECISION MAKING: Stable/uncomplicated  EVALUATION COMPLEXITY: Low   GOALS: Goals reviewed with patient? Yes  SHORT TERM GOALS: Target date: 12/14/2021  Decrease in spasm around hip via use of DN/manual therapy Baseline: Goal status:achieved   LONG TERM GOALS: Target date: 02/01/2022  FOTO goal to be met Baseline:  Goal status: INITIAL  2.  Able to demo gross 5/5 hip abd strength Baseline: significant pain at eval, testing unappropriate Goal status: INITIAL  3.  Able to participate in proper strengthening HEP with understanding of rest breaks and control dyspnea Baseline:  Goal status: INITIAL  4.  Able to complete ADLs pain <=2/10 Baseline:  Goal status: INITIAL   PLAN: PT FREQUENCY: 1-2x/week  PT DURATION: 10 weeks  PLANNED INTERVENTIONS: Therapeutic exercises, Therapeutic activity, Neuromuscular re-education, Balance training, Gait  training, Patient/Family education, Self Care, Joint mobilization, Stair training, Aquatic Therapy, Dry Needling, Electrical stimulation, Spinal mobilization, Cryotherapy, Moist heat, Taping, Ionotophoresis 19m/ml Dexamethasone, and Manual therapy.  PLAN FOR NEXT SESSION: DN prn, review breathing mechanics- add to functional activities such as squatting, bending etc.    Lyndsie Wallman C. Akira Perusse PT, DPT 12/19/21 1:17 PM

## 2021-12-21 ENCOUNTER — Ambulatory Visit (HOSPITAL_BASED_OUTPATIENT_CLINIC_OR_DEPARTMENT_OTHER): Payer: Medicare Other | Admitting: Physical Therapy

## 2021-12-21 ENCOUNTER — Encounter (HOSPITAL_BASED_OUTPATIENT_CLINIC_OR_DEPARTMENT_OTHER): Payer: Self-pay

## 2021-12-26 ENCOUNTER — Ambulatory Visit (HOSPITAL_BASED_OUTPATIENT_CLINIC_OR_DEPARTMENT_OTHER): Payer: Medicare Other | Attending: Orthopedic Surgery | Admitting: Physical Therapy

## 2021-12-26 ENCOUNTER — Encounter (HOSPITAL_BASED_OUTPATIENT_CLINIC_OR_DEPARTMENT_OTHER): Payer: Self-pay | Admitting: Physical Therapy

## 2021-12-26 DIAGNOSIS — R262 Difficulty in walking, not elsewhere classified: Secondary | ICD-10-CM | POA: Diagnosis not present

## 2021-12-26 DIAGNOSIS — M5459 Other low back pain: Secondary | ICD-10-CM | POA: Diagnosis not present

## 2021-12-26 NOTE — Therapy (Signed)
OUTPATIENT PHYSICAL THERAPY THORACOLUMBAR Treatment    Patient Name: Michelle [name] MRN: [id number] DOB:05/29/1945, 76 y.o., female Today's Date: 12/26/2021   PT End of Session - 12/26/21 1327     Visit Number 6    Number of Visits 17    Date for PT Re-Evaluation 02/01/22    Authorization Type BCBS MCR    PT Start Time 1325    PT Stop Time 1404    PT Time Calculation (min) 39 min    Activity Tolerance Patient tolerated treatment well    Behavior During Therapy WFL for tasks assessed/performed                Past Medical History:  Diagnosis Date   Acute hypoxemic respiratory failure ([place]) 04/24/2017   Anxiety    Arthritis    "back" (04/24/2017)   Atherosclerosis of coronary artery    Blurry vision, left eye    "YAG on the left; to be repaired" (04/24/2017)   Chondromalacia of knee    right knee   Chronic bronchitis (HCC)    Chronic lower back pain    DDD (degenerative disc disease) 07/24/2011   Osteoarthritis-s/p LTKA 9'20, now right planned   Depression 07/24/2011   tx. depression only   DOE (dyspnea on exertion)    Fatty liver    History of diverticulitis 02/2018   Hx of adenomatous polyp of colon 03/21/2014   Hyperlipidemia    Hypertension    IBS (irritable bowel syndrome)    OSA on CPAP 12/07/2016   Pneumonia 1990s X 1   SOB (shortness of breath)    Vitamin D deficiency    Past Surgical History:  Procedure Laterality Date   APPENDECTOMY  1967   BOTOX INJECTION     "face"   CATARACT EXTRACTION W/ INTRAOCULAR LENS IMPLANT Bilateral 2000s   CESAREAN SECTION  1972; 1975   COLONOSCOPY W/ BIOPSIES AND POLYPECTOMY  "a few"   DILATION AND CURETTAGE OF UTERUS     HYSTEROSCOPY WITH D & C  08/29/2006   and resection of endometrial polyp/notes 09/10/2010   IR IMAGING GUIDED PORT INSERTION  01/18/2021   JOINT REPLACEMENT     KNEE ARTHROSCOPY Bilateral    NASAL SEPTUM SURGERY  1972   OOPHORECTOMY Right 1994   TOTAL KNEE ARTHROPLASTY  08/05/2011   Procedure:  TOTAL KNEE ARTHROPLASTY;  Surgeon: Gearlean Alf, MD;  Location: [facility];  Service: Orthopedics;  Laterality: Right;   TOTAL KNEE ARTHROPLASTY Left 05/2009   TUBAL LIGATION  1975   Patient Active Problem List   Diagnosis Date Noted   Port-A-Cath in place 08/27/2021   Macrocytosis without anemia 08/30/2020   Hereditary hemochromatosis ([place]) 06/19/2020   Cough 05/14/2017   ETOH abuse 04/27/2017   Abnormal LFTs 04/26/2017   Thrombocytopenia ([place]) 04/26/2017   Dyspnea on exertion 03/18/2017   Morbid obesity ([place]) 03/18/2017   Obstructive sleep apnea 12/07/2016   Essential hypertension 12/07/2016   Hx of adenomatous polyp of colon 03/21/2014   Postop Hyponatremia 08/07/2011   OA (osteoarthritis) of knee 08/05/2011   Pain in limb 05/15/2011    REFERRING PROVIDER: Melina Schools, MD  REFERRING DIAG: M54.51 (ICD-10-CM) - Vertebrogenic low back pain  Rationale for Evaluation and Treatment Rehabilitation  THERAPY DIAG:  Other low back pain  Difficulty in walking, not elsewhere classified  ONSET DATE: 6th of July was in pain so bad I was nearly in tears  SUBJECTIVE:  SUBJECTIVE STATEMENT: My Left shoulder is really bothering me. My back feels like it has some pain in it. Rt lower back aches. But the Lt shoulder is really taking all of my attention.    PERTINENT HISTORY: DDD, OA, DOE, Rt TKA, IBS & Diverticulitis  PAIN:  Are you having pain? Yes: NPRS scale: 0/10 Pain location: Lt low back Pain description:  ache Aggravating factors: standing Relieving factors: sitting    PRECAUTIONS: None  WEIGHT BEARING RESTRICTIONS No  FALLS:  Has patient fallen in last 6 months? No  OCCUPATION: retired  PLOF: Independent  PATIENT GOALS decrease pain, I get so out of breath with  activity   OBJECTIVE:   PATIENT SURVEYS:  FOTO 47   POSTURE: anterior pelvic tilt  PALPATION: Spasm in Rt post hip that created concordant pain  LUMBAR ROM:   Active  A/PROM  eval  Flexion WFL  Extension WFL  Right lateral flexion pain  Left lateral flexion WFL  Right rotation   Left rotation    (Blank rows = not tested)  LOWER EXTREMITY ROM:     Active  Right eval Left eval  Hip flexion    Hip extension    Hip abduction    Hip adduction    Hip internal rotation    Hip external rotation    Knee flexion    Knee extension    Ankle dorsiflexion    Ankle plantarflexion    Ankle inversion    Ankle eversion     (Blank rows = not tested)  LOWER EXTREMITY MMT:    MMT Right 8/21 Left 8/21  Hip flexion    Hip extension    Hip abduction 4 3+  Hip adduction    Hip internal rotation    Hip external rotation    Knee flexion    Knee extension    Ankle dorsiflexion    Ankle plantarflexion    Ankle inversion    Ankle eversion     (Blank rows = not tested)    TODAY'S TREATMENT   9/6  Trigger Point Dry Needling, Manual Therapy Treatment:  Initial or subsequent education regarding Trigger Point Dry Needling: Subsequent Did patient give consent to treatment with Trigger Point Dry Needling: Yes TPDN with skilled palpation and monitoring followed by STM to the following muscles: Lt upper trap  Lt first rib depression mobilization  Seated hamstring stretch  Supine figure 4 with small range LTR Hooklying bridge x15 Supine SLR with cues for core engagement x10 each Supine leg circles with cues for core control to keep level pelvis x10 each Dead bug x12   01/03/23  MANUAL: STM bil upper trap & levator, AP Lt thoracic spine, inf mobs to Lt first rib Upper trap stretch Deep breathing with march, with LAQ Sidelying clams paired with breathing Moist heat- 10 min end of session    PATIENT EDUCATION:  Education details: Solicitor Person  educated: Patient Education method: Explanation, Demonstration, Tactile cues, Verbal cues, and Handouts Education comprehension: verbalized understanding, returned demonstration, verbal cues required, tactile cues required, and needs further education   HOME EXERCISE PROGRAM: BQCMMAME  ASSESSMENT:  CLINICAL IMPRESSION:  Spasm in Lt upper trap released with DN and improved pain levels in cervical and lumbar region. Begins aquatics next appointment for un weighted strengthening. Walked back to aqua center today to discuss aquatic therapy and what to expect.    OBJECTIVE IMPAIRMENTS decreased activity tolerance, difficulty walking, decreased ROM, decreased strength, increased muscle spasms, impaired flexibility,  improper body mechanics, postural dysfunction, obesity, and pain.   ACTIVITY LIMITATIONS carrying, lifting, bending, standing, squatting, sleeping, stairs, bed mobility, and locomotion level  PARTICIPATION LIMITATIONS: meal prep, cleaning, laundry, shopping, and community activity  [place], Time since onset of injury/illness/exacerbation, and 1-2 comorbidities: DOE, DDD  are also affecting patient's functional outcome.   REHAB POTENTIAL: Good  CLINICAL DECISION MAKING: Stable/uncomplicated  EVALUATION COMPLEXITY: Low   GOALS: Goals reviewed with patient? Yes  SHORT TERM GOALS: Target date: 12/14/2021  Decrease in spasm around hip via use of DN/manual therapy Baseline: Goal status:achieved   LONG TERM GOALS: Target date: 02/01/2022  FOTO goal to be met Baseline:  Goal status: INITIAL  2.  Able to demo gross 5/5 hip abd strength Baseline: significant pain at eval, testing unappropriate Goal status: INITIAL  3.  Able to participate in proper strengthening HEP with understanding of rest breaks and control dyspnea Baseline:  Goal status: INITIAL  4.  Able to complete ADLs pain <=2/10 Baseline:  Goal status: INITIAL   PLAN: PT FREQUENCY:  1-2x/week  PT DURATION: 10 weeks  PLANNED INTERVENTIONS: Therapeutic exercises, Therapeutic activity, Neuromuscular re-education, Balance training, Gait training, Patient/Family education, Self Care, Joint mobilization, Stair training, Aquatic Therapy, Dry Needling, Electrical stimulation, Spinal mobilization, Cryotherapy, Moist heat, Taping, Ionotophoresis 62m/ml Dexamethasone, and Manual therapy.  PLAN FOR NEXT SESSION: gross lumbopelvic strength in pool, bending/squatting with use of hip hinge- requires cues for breathing    Keria Widrig C. Shirin Echeverry PT, DPT 12/26/21 2:18 PM

## 2021-12-31 ENCOUNTER — Inpatient Hospital Stay: Payer: Medicare Other

## 2021-12-31 ENCOUNTER — Inpatient Hospital Stay: Payer: Medicare Other | Attending: Hematology and Oncology

## 2021-12-31 ENCOUNTER — Other Ambulatory Visit: Payer: Self-pay

## 2021-12-31 LAB — COMPREHENSIVE METABOLIC PANEL
ALT: 26 U/L (ref 0–44)
AST: 22 U/L (ref 15–41)
Albumin: 3.8 g/dL (ref 3.5–5.0)
Alkaline Phosphatase: 73 U/L (ref 38–126)
Anion gap: 5 (ref 5–15)
BUN: 18 mg/dL (ref 8–23)
CO2: 28 mmol/L (ref 22–32)
Calcium: 8.7 mg/dL — ABNORMAL LOW (ref 8.9–10.3)
Chloride: 105 mmol/L (ref 98–111)
Creatinine, Ser: 0.84 mg/dL (ref 0.44–1.00)
GFR, Estimated: >60 mL/min (ref >60)
Glucose, Bld: 135 mg/dL — ABNORMAL HIGH (ref 70–99)
Potassium: 4.1 mmol/L (ref 3.5–5.1)
Sodium: 138 mmol/L (ref 135–145)
Total Bilirubin: 0.8 mg/dL (ref 0.3–1.2)
Total Protein: 6.8 g/dL (ref 6.5–8.1)

## 2021-12-31 LAB — CBC WITH DIFFERENTIAL/PLATELET
Abs Immature Granulocytes: 0.03 10*3/uL (ref 0.00–0.07)
Basophils Absolute: 0.1 10*3/uL (ref 0.0–0.1)
Basophils Relative: 1 %
Eosinophils Absolute: 0.3 10*3/uL (ref 0.0–0.5)
Eosinophils Relative: 5 %
HCT: 38 % (ref 36.0–46.0)
Hemoglobin: 13.5 g/dL (ref 12.0–15.0)
Immature Granulocytes: 1 %
Lymphocytes Relative: 20 %
Lymphs Abs: 1.2 10*3/uL (ref 0.7–4.0)
MCH: 35.4 pg — ABNORMAL HIGH (ref 26.0–34.0)
MCHC: 35.5 g/dL (ref 30.0–36.0)
MCV: 99.7 fL (ref 80.0–100.0)
Monocytes Absolute: 0.7 10*3/uL (ref 0.1–1.0)
Monocytes Relative: 12 %
Neutro Abs: 3.7 10*3/uL (ref 1.7–7.7)
Neutrophils Relative %: 61 %
Platelets: 159 10*3/uL (ref 150–400)
RBC: 3.81 MIL/uL — ABNORMAL LOW (ref 3.87–5.11)
RDW: 12.5 % (ref 11.5–15.5)
WBC: 6.1 10*3/uL (ref 4.0–10.5)
nRBC: 0 % (ref 0.0–0.2)

## 2021-12-31 MED ORDER — SODIUM CHLORIDE 0.9% FLUSH
10.0000 mL | INTRAVENOUS | Status: DC | PRN
  Administered 2021-12-31: 10 mL via INTRAVENOUS

## 2021-12-31 MED ORDER — HEPARIN SOD (PORK) LOCK FLUSH 100 UNIT/ML IV SOLN
500.0000 [IU] | Freq: Once | INTRAVENOUS | Status: AC
  Administered 2021-12-31: 500 [IU] via INTRAVENOUS

## 2021-12-31 MED ORDER — SODIUM CHLORIDE 0.9% FLUSH
10.0000 mL | INTRAVENOUS | Status: AC | PRN
  Administered 2021-12-31: 10 mL via INTRAVENOUS

## 2021-12-31 NOTE — Progress Notes (Signed)
Patient understands that she does not need a phlebotomy today per Dr. Chryl Heck since her last Ferritin was 103 prior to a phlebotomy. She understands to follow up at her next scheduled appointment on 10/9.

## 2021-12-31 NOTE — Addendum Note (Signed)
Addended by: Marlowe Aschoff B on: 12/31/2021 03:38 PM   Modules accepted: Orders

## 2022-01-01 ENCOUNTER — Ambulatory Visit (HOSPITAL_BASED_OUTPATIENT_CLINIC_OR_DEPARTMENT_OTHER): Payer: Medicare Other | Admitting: Physical Therapy

## 2022-01-01 ENCOUNTER — Encounter (HOSPITAL_BASED_OUTPATIENT_CLINIC_OR_DEPARTMENT_OTHER): Payer: Self-pay | Admitting: Physical Therapy

## 2022-01-01 DIAGNOSIS — R262 Difficulty in walking, not elsewhere classified: Secondary | ICD-10-CM | POA: Diagnosis not present

## 2022-01-01 DIAGNOSIS — M5459 Other low back pain: Secondary | ICD-10-CM | POA: Diagnosis not present

## 2022-01-01 LAB — FERRITIN: Ferritin: 73 ng/mL (ref 11–307)

## 2022-01-01 NOTE — Therapy (Signed)
OUTPATIENT PHYSICAL THERAPY THORACOLUMBAR Treatment    Patient Name: Michelle [name] MRN: [id number] DOB:17-Dec-1945, 76 y.o., female Today's Date: 01/01/2022   PT End of Session - 01/01/22 1844     Visit Number 7    Number of Visits 17    Date for PT Re-Evaluation 02/01/22    Authorization Type BCBS MCR    PT Start Time 1615    PT Stop Time 1700    PT Time Calculation (min) 45 min                 Past Medical History:  Diagnosis Date   Acute hypoxemic respiratory failure ([place]) 04/24/2017   Anxiety    Arthritis    "back" (04/24/2017)   Atherosclerosis of coronary artery    Blurry vision, left eye    "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 depression only   DOE (dyspnea on exertion)    Fatty liver    History of diverticulitis 02/2018   Hx of adenomatous polyp of colon 03/21/2014   Hyperlipidemia    Hypertension    IBS (irritable bowel syndrome)    OSA on CPAP 12/07/2016   Pneumonia 1990s X 1   SOB (shortness of breath)    Vitamin D deficiency    Past Surgical History:  Procedure Laterality Date   APPENDECTOMY  1967   BOTOX INJECTION     "face"   CATARACT EXTRACTION W/ INTRAOCULAR LENS IMPLANT Bilateral 2000s   CESAREAN SECTION  1972; 1975   COLONOSCOPY W/ BIOPSIES AND POLYPECTOMY  "a few"   DILATION AND CURETTAGE OF UTERUS     HYSTEROSCOPY WITH D & C  08/29/2006   and resection of endometrial polyp/notes 09/10/2010   IR IMAGING GUIDED PORT INSERTION  01/18/2021   JOINT REPLACEMENT     KNEE ARTHROSCOPY Bilateral    NASAL SEPTUM SURGERY  1972   OOPHORECTOMY Right 1994   TOTAL KNEE ARTHROPLASTY  08/05/2011   Procedure: TOTAL KNEE ARTHROPLASTY;  Surgeon: Gearlean Alf, MD;  Location: [facility];  Service: Orthopedics;  Laterality: Right;    TOTAL KNEE ARTHROPLASTY Left 05/2009   TUBAL LIGATION  1975   Patient Active Problem List   Diagnosis Date Noted   Port-A-Cath in place 08/27/2021   Macrocytosis without anemia 08/30/2020   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   Cough 05/14/2017   ETOH abuse 04/27/2017   Abnormal LFTs 04/26/2017   Thrombocytopenia ([place]) 04/26/2017   Dyspnea on exertion 03/18/2017   Morbid obesity ([place]) 03/18/2017   Obstructive sleep apnea 12/07/2016   Essential hypertension 12/07/2016   Hx of adenomatous polyp of colon 03/21/2014   Postop Hyponatremia 08/07/2011   OA (osteoarthritis) of knee 08/05/2011   Pain in limb 05/15/2011    REFERRING PROVIDER: Melina Schools, MD  REFERRING DIAG: M54.51 (ICD-10-CM) - Vertebrogenic low back pain  Rationale for Evaluation and Treatment Rehabilitation  THERAPY DIAG:  Other low back pain  Difficulty in walking, not elsewhere classified  ONSET DATE: 6th of July was in pain so bad I was nearly in tears  SUBJECTIVE:  SUBJECTIVE STATEMENT: Left shoulder continues to hurt.  DN has greatly improved pain   PERTINENT HISTORY: DDD, OA, DOE, Rt TKA, IBS & Diverticulitis  PAIN:  Are you having pain? Yes: NPRS scale: 4/10 Pain location: Lt low back Pain description:  ache Aggravating factors: standing Relieving factors: sitting    PRECAUTIONS: None  WEIGHT BEARING RESTRICTIONS No  FALLS:  Has patient fallen in last 6 months? No  OCCUPATION: retired  PLOF: Independent  PATIENT GOALS decrease pain, I get so out of breath with activity   OBJECTIVE:   PATIENT SURVEYS:  FOTO 47   POSTURE: anterior pelvic tilt  PALPATION: Spasm in Rt post hip that created concordant pain  LUMBAR ROM:   Active  A/PROM  eval  Flexion WFL  Extension WFL  Right  lateral flexion pain  Left lateral flexion WFL  Right rotation   Left rotation    (Blank rows = not tested)  LOWER EXTREMITY ROM:     Active  Right eval Left eval  Hip flexion    Hip extension    Hip abduction    Hip adduction    Hip internal rotation    Hip external rotation    Knee flexion    Knee extension    Ankle dorsiflexion    Ankle plantarflexion    Ankle inversion    Ankle eversion     (Blank rows = not tested)  LOWER EXTREMITY MMT:    MMT Right 8/21 Left 8/21  Hip flexion    Hip extension    Hip abduction 4 3+  Hip adduction    Hip internal rotation    Hip external rotation    Knee flexion    Knee extension    Ankle dorsiflexion    Ankle plantarflexion    Ankle inversion    Ankle eversion     (Blank rows = not tested)    TODAY'S TREATMENT   9/12 Pt seen for aquatic therapy today.  Treatment took place in water 3.25-4.5 ft in depth at the [facility]. Temp of water was 91.  Pt entered/exited the pool via steps with hand rail indep.  Intro to setting Walking forward, back and side stepping Seated lumbar flex alternated with rotation Standing hip hinge x 5 Standing holding to hand rails: runners stretch hamstring and gastroc; figure 4 stretch 3x10-15s hold ea Standing hold to pool side 3.8 ft: abdominal bracing instruction throughout hip openers; hip circles cw&ccw Forward and backward amb with 1 foam hand buoy submerged at hips for core engagement x 4 widths   Pt requires the buoyancy and hydrostatic pressure of water for support, and to offload joints by unweighting joint load by at least 50 % in navel deep water and by at least 75-80% in chest to neck deep water.  Viscosity of the water is needed for resistance of strengthening. Water current perturbations provides challenge to standing balance requiring increased core activation.     9/6  Trigger Point Dry Needling, Manual Therapy Treatment:  Initial or subsequent education  regarding Trigger Point Dry Needling: Subsequent Did patient give consent to treatment with Trigger Point Dry Needling: Yes TPDN with skilled palpation and monitoring followed by STM to the following muscles: Lt upper trap  Lt first rib depression mobilization  Seated hamstring stretch  Supine figure 4 with small range LTR Hooklying bridge x15 Supine SLR with cues for core engagement x10 each Supine leg circles with cues for core control to keep level pelvis x10  each Dead bug x12   8/30  MANUAL: STM bil upper trap & levator, AP Lt thoracic spine, inf mobs to Lt first rib Upper trap stretch Deep breathing with march, with LAQ Sidelying clams paired with breathing Moist heat- 10 min end of session    PATIENT EDUCATION:  Education details: Solicitor Person educated: Patient Education method: Explanation, Demonstration, Tactile cues, Verbal cues, and Handouts Education comprehension: verbalized understanding, returned demonstration, verbal cues required, tactile cues required, and needs further education   HOME EXERCISE PROGRAM: BQCMMAME  ASSESSMENT:  CLINICAL IMPRESSION: Pt demonstrates indep and safety in setting. Initiated lumbar flex in sitting then progressed to standing hip hinge which she completed well.  Needed demonstration as well as vc throughout for all activities. She reports good stretch in LB, pelvic and LE in all positions trialed with some apprehension in setting. Pt given continuous vc  throughout for breathing as she does hold breath. Will progress to core/lumbopelvic strengthening and advance stretching.    OBJECTIVE IMPAIRMENTS decreased activity tolerance, difficulty walking, decreased ROM, decreased strength, increased muscle spasms, impaired flexibility, improper body mechanics, postural dysfunction, obesity, and pain.   ACTIVITY LIMITATIONS carrying, lifting, bending, standing, squatting, sleeping, stairs, bed mobility, and locomotion  level  PARTICIPATION LIMITATIONS: meal prep, cleaning, laundry, shopping, and community activity  [place], Time since onset of injury/illness/exacerbation, and 1-2 comorbidities: DOE, DDD  are also affecting patient's functional outcome.   REHAB POTENTIAL: Good  CLINICAL DECISION MAKING: Stable/uncomplicated  EVALUATION COMPLEXITY: Low   GOALS: Goals reviewed with patient? Yes  SHORT TERM GOALS: Target date: 12/14/2021  Decrease in spasm around hip via use of DN/manual therapy Baseline: Goal status:achieved   LONG TERM GOALS: Target date: 02/01/2022  FOTO goal to be met Baseline:  Goal status: INITIAL  2.  Able to demo gross 5/5 hip abd strength Baseline: significant pain at eval, testing unappropriate Goal status: INITIAL  3.  Able to participate in proper strengthening HEP with understanding of rest breaks and control dyspnea Baseline:  Goal status: INITIAL  4.  Able to complete ADLs pain <=2/10 Baseline:  Goal status: INITIAL   PLAN: PT FREQUENCY: 1-2x/week  PT DURATION: 10 weeks  PLANNED INTERVENTIONS: Therapeutic exercises, Therapeutic activity, Neuromuscular re-education, Balance training, Gait training, Patient/Family education, Self Care, Joint mobilization, Stair training, Aquatic Therapy, Dry Needling, Electrical stimulation, Spinal mobilization, Cryotherapy, Moist heat, Taping, Ionotophoresis 69m/ml Dexamethasone, and Manual therapy.  PLAN FOR NEXT SESSION: gross lumbopelvic strength in pool, bending/squatting with use of hip hinge- requires cues for breathing    MStanton Kidney(Tharon Aquas Sharita Bienaime MPT 01/01/22 6:46 PM

## 2022-01-03 ENCOUNTER — Ambulatory Visit (HOSPITAL_BASED_OUTPATIENT_CLINIC_OR_DEPARTMENT_OTHER): Payer: Medicare Other | Admitting: Physical Therapy

## 2022-01-03 ENCOUNTER — Encounter (HOSPITAL_BASED_OUTPATIENT_CLINIC_OR_DEPARTMENT_OTHER): Payer: Self-pay | Admitting: Physical Therapy

## 2022-01-03 DIAGNOSIS — M5459 Other low back pain: Secondary | ICD-10-CM

## 2022-01-03 DIAGNOSIS — R262 Difficulty in walking, not elsewhere classified: Secondary | ICD-10-CM

## 2022-01-03 NOTE — Therapy (Signed)
OUTPATIENT PHYSICAL THERAPY THORACOLUMBAR Treatment    Patient Name: Michelle [name] MRN: [id number] DOB:01-09-1946, 76 y.o., female Today's Date: 01/03/2022   PT End of Session - 01/03/22 1636     Visit Number 8    Number of Visits 17    Date for PT Re-Evaluation 02/01/22    Authorization Type BCBS MCR    PT Start Time 1400    PT Stop Time 3953    PT Time Calculation (min) 45 min    Activity Tolerance Patient tolerated treatment well    Behavior During Therapy WFL for tasks assessed/performed                  Past Medical History:  Diagnosis Date   Acute hypoxemic respiratory failure ([place]) 04/24/2017   Anxiety    Arthritis    "back" (04/24/2017)   Atherosclerosis of coronary artery    Blurry vision, left eye    "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 depression only   DOE (dyspnea on exertion)    Fatty liver    History of diverticulitis 02/2018   Hx of adenomatous polyp of colon 03/21/2014   Hyperlipidemia    Hypertension    IBS (irritable bowel syndrome)    OSA on CPAP 12/07/2016   Pneumonia 1990s X 1   SOB (shortness of breath)    Vitamin D deficiency    Past Surgical History:  Procedure Laterality Date   APPENDECTOMY  1967   BOTOX INJECTION     "face"   CATARACT EXTRACTION W/ INTRAOCULAR LENS IMPLANT Bilateral 2000s   CESAREAN SECTION  1972; 1975   COLONOSCOPY W/ BIOPSIES AND POLYPECTOMY  "a few"   DILATION AND CURETTAGE OF UTERUS     HYSTEROSCOPY WITH D & C  08/29/2006   and resection of endometrial polyp/notes 09/10/2010   IR IMAGING GUIDED PORT INSERTION  01/18/2021   JOINT REPLACEMENT     KNEE ARTHROSCOPY Bilateral    NASAL SEPTUM SURGERY  1972   OOPHORECTOMY Right 1994   TOTAL KNEE ARTHROPLASTY  08/05/2011    Procedure: TOTAL KNEE ARTHROPLASTY;  Surgeon: Gearlean Alf, MD;  Location: [facility];  Service: Orthopedics;  Laterality: Right;   TOTAL KNEE ARTHROPLASTY Left 05/2009   TUBAL LIGATION  1975   Patient Active Problem List   Diagnosis Date Noted   Port-A-Cath in place 08/27/2021   Macrocytosis without anemia 08/30/2020   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   Cough 05/14/2017   ETOH abuse 04/27/2017   Abnormal LFTs 04/26/2017   Thrombocytopenia ([place]) 04/26/2017   Dyspnea on exertion 03/18/2017   Morbid obesity ([place]) 03/18/2017   Obstructive sleep apnea 12/07/2016   Essential hypertension 12/07/2016   Hx of adenomatous polyp of colon 03/21/2014   Postop Hyponatremia 08/07/2011   OA (osteoarthritis) of knee 08/05/2011   Pain in limb 05/15/2011    REFERRING PROVIDER: Melina Schools, MD  REFERRING DIAG: M54.51 (ICD-10-CM) - Vertebrogenic low back pain  Rationale for Evaluation and Treatment Rehabilitation  THERAPY DIAG:  Other low back pain  Difficulty in walking, not elsewhere classified  ONSET DATE: 6th of July was in pain so bad I was nearly in tears  SUBJECTIVE:  SUBJECTIVE STATEMENT: Left shoulder continues to hurt.  DN has greatly improved pain   PERTINENT HISTORY: DDD, OA, DOE, Rt TKA, IBS & Diverticulitis  PAIN:  Are you having pain? Yes: NPRS scale: 7/10 Pain location: Lt low back Pain description:  ache Aggravating factors: standing Relieving factors: sitting    PRECAUTIONS: None  WEIGHT BEARING RESTRICTIONS No  FALLS:  Has patient fallen in last 6 months? No  OCCUPATION: retired  PLOF: Independent  PATIENT GOALS decrease pain, I get so out of breath with activity   OBJECTIVE:   PATIENT SURVEYS:  FOTO 47   POSTURE: anterior pelvic  tilt  PALPATION: Spasm in Rt post hip that created concordant pain  LUMBAR ROM:   Active  A/PROM  eval  Flexion WFL  Extension WFL  Right lateral flexion pain  Left lateral flexion WFL  Right rotation   Left rotation    (Blank rows = not tested)  LOWER EXTREMITY ROM:     Active  Right eval Left eval  Hip flexion    Hip extension    Hip abduction    Hip adduction    Hip internal rotation    Hip external rotation    Knee flexion    Knee extension    Ankle dorsiflexion    Ankle plantarflexion    Ankle inversion    Ankle eversion     (Blank rows = not tested)  LOWER EXTREMITY MMT:    MMT Right 8/21 Left 8/21  Hip flexion    Hip extension    Hip abduction 4 3+  Hip adduction    Hip internal rotation    Hip external rotation    Knee flexion    Knee extension    Ankle dorsiflexion    Ankle plantarflexion    Ankle inversion    Ankle eversion     (Blank rows = not tested)    TODAY'S TREATMENT   9/12 Pt seen for aquatic therapy today.  Treatment took place in water 3.25-4.5 ft in depth at the [place] [facility]. Temp of water was 92.  Pt entered/exited the pool via steps with hand rail indep.   Walking forward, back and side stepping Holding to white barbell: TR; HR/ Marching Standing hip hinge using barbell x 5; x2 with R&L hip hiking Standing lumbar rotation Standing holding to hand rails: runners stretch hamstring and gastroc; figure 4 stretch; hip flex and quad 3x10-15s hold ea Seated on noodle: pelvic mobilization hip hiking; ant/post tilt; rotation Standing hold to pool side 3.8 ft: abdominal bracing instruction throughout hip openers   Pt requires the buoyancy and hydrostatic pressure of water for support, and to offload joints by unweighting joint load by at least 50 % in navel deep water and by at least 75-80% in chest to neck deep water.  Viscosity of the water is needed for resistance of strengthening. Water current perturbations  provides challenge to standing balance requiring increased core activation.     9/6  Trigger Point Dry Needling, Manual Therapy Treatment:  Initial or subsequent education regarding Trigger Point Dry Needling: Subsequent Did patient give consent to treatment with Trigger Point Dry Needling: Yes TPDN with skilled palpation and monitoring followed by STM to the following muscles: Lt upper trap  Lt first rib depression mobilization  Seated hamstring stretch  Supine figure 4 with small range LTR Hooklying bridge x15 Supine SLR with cues for core engagement x10 each Supine leg circles with cues for core control to keep level  pelvis x10 each Dead bug x12   2022-12-29  MANUAL: STM bil upper trap & levator, AP Lt thoracic spine, inf mobs to Lt first rib Upper trap stretch Deep breathing with march, with LAQ Sidelying clams paired with breathing Moist heat- 10 min end of session    PATIENT EDUCATION:  Education details: Solicitor Person educated: Patient Education method: Explanation, Demonstration, Tactile cues, Verbal cues, and Handouts Education comprehension: verbalized understanding, returned demonstration, verbal cues required, tactile cues required, and needs further education   HOME EXERCISE PROGRAM: BQCMMAME  ASSESSMENT:  CLINICAL IMPRESSION: Good response to last visit.  Reduction in pain until next day slept all night without interruption. Decreased need for cues for ventilation throughout session. Focused on stretching/mobilization of lumbopelvic aera and le. Pain reduction today to 3 from 7/10. Plan to increase focus on strengthening going forward as tolerated.     OBJECTIVE IMPAIRMENTS decreased activity tolerance, difficulty walking, decreased ROM, decreased strength, increased muscle spasms, impaired flexibility, improper body mechanics, postural dysfunction, obesity, and pain.   ACTIVITY LIMITATIONS carrying, lifting, bending, standing, squatting,  sleeping, stairs, bed mobility, and locomotion level  PARTICIPATION LIMITATIONS: meal prep, cleaning, laundry, shopping, and community activity  [place], Time since onset of injury/illness/exacerbation, and 1-2 comorbidities: DOE, DDD  are also affecting patient's functional outcome.   REHAB POTENTIAL: Good  CLINICAL DECISION MAKING: Stable/uncomplicated  EVALUATION COMPLEXITY: Low   GOALS: Goals reviewed with patient? Yes  SHORT TERM GOALS: Target date: 12/14/2021  Decrease in spasm around hip via use of DN/manual therapy Baseline: Goal status:achieved   LONG TERM GOALS: Target date: 02/01/2022  FOTO goal to be met Baseline:  Goal status: INITIAL  2.  Able to demo gross 5/5 hip abd strength Baseline: significant pain at eval, testing unappropriate Goal status: INITIAL  3.  Able to participate in proper strengthening HEP with understanding of rest breaks and control dyspnea Baseline:  Goal status: INITIAL  4.  Able to complete ADLs pain <=2/10 Baseline:  Goal status: INITIAL   PLAN: PT FREQUENCY: 1-2x/week  PT DURATION: 10 weeks  PLANNED INTERVENTIONS: Therapeutic exercises, Therapeutic activity, Neuromuscular re-education, Balance training, Gait training, Patient/Family education, Self Care, Joint mobilization, Stair training, Aquatic Therapy, Dry Needling, Electrical stimulation, Spinal mobilization, Cryotherapy, Moist heat, Taping, Ionotophoresis 43m/ml Dexamethasone, and Manual therapy.  PLAN FOR NEXT SESSION: gross lumbopelvic strength in pool, bending/squatting with use of hip hinge- requires cues for breathing    MStanton Kidney(Tharon Aquas Labaron Digirolamo MPT 01/03/22 4:37 PM

## 2022-01-08 ENCOUNTER — Ambulatory Visit (HOSPITAL_BASED_OUTPATIENT_CLINIC_OR_DEPARTMENT_OTHER): Payer: Medicare Other | Admitting: Physical Therapy

## 2022-01-08 ENCOUNTER — Encounter (HOSPITAL_BASED_OUTPATIENT_CLINIC_OR_DEPARTMENT_OTHER): Payer: Self-pay | Admitting: Physical Therapy

## 2022-01-08 DIAGNOSIS — R262 Difficulty in walking, not elsewhere classified: Secondary | ICD-10-CM | POA: Diagnosis not present

## 2022-01-08 DIAGNOSIS — M5459 Other low back pain: Secondary | ICD-10-CM | POA: Diagnosis not present

## 2022-01-08 NOTE — Therapy (Signed)
OUTPATIENT PHYSICAL THERAPY THORACOLUMBAR Treatment    Patient Name: Michelle [name] MRN: [id number] DOB:1945-04-28, 76 y.o., female Today's Date: 01/08/2022   PT End of Session - 01/08/22 1535     Visit Number 9    Number of Visits 17    Date for PT Re-Evaluation 02/01/22    Authorization Type BCBS MCR    PT Start Time 1534    PT Stop Time 1614    PT Time Calculation (min) 40 min                  Past Medical History:  Diagnosis Date   Acute hypoxemic respiratory failure ([place]) 04/24/2017   Anxiety    Arthritis    "back" (04/24/2017)   Atherosclerosis of coronary artery    Blurry vision, left eye    "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 depression only   DOE (dyspnea on exertion)    Fatty liver    History of diverticulitis 02/2018   Hx of adenomatous polyp of colon 03/21/2014   Hyperlipidemia    Hypertension    IBS (irritable bowel syndrome)    OSA on CPAP 12/07/2016   Pneumonia 1990s X 1   SOB (shortness of breath)    Vitamin D deficiency    Past Surgical History:  Procedure Laterality Date   APPENDECTOMY  1967   BOTOX INJECTION     "face"   CATARACT EXTRACTION W/ INTRAOCULAR LENS IMPLANT Bilateral 2000s   CESAREAN SECTION  1972; 1975   COLONOSCOPY W/ BIOPSIES AND POLYPECTOMY  "a few"   DILATION AND CURETTAGE OF UTERUS     HYSTEROSCOPY WITH D & C  08/29/2006   and resection of endometrial polyp/notes 09/10/2010   IR IMAGING GUIDED PORT INSERTION  01/18/2021   JOINT REPLACEMENT     KNEE ARTHROSCOPY Bilateral    NASAL SEPTUM SURGERY  1972   OOPHORECTOMY Right 1994   TOTAL KNEE ARTHROPLASTY  08/05/2011   Procedure: TOTAL KNEE ARTHROPLASTY;  Surgeon: Gearlean Alf, MD;  Location: [facility];  Service: Orthopedics;  Laterality:  Right;   TOTAL KNEE ARTHROPLASTY Left 05/2009   TUBAL LIGATION  1975   Patient Active Problem List   Diagnosis Date Noted   Port-A-Cath in place 08/27/2021   Macrocytosis without anemia 08/30/2020   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   Cough 05/14/2017   ETOH abuse 04/27/2017   Abnormal LFTs 04/26/2017   Thrombocytopenia ([place]) 04/26/2017   Dyspnea on exertion 03/18/2017   Morbid obesity ([place]) 03/18/2017   Obstructive sleep apnea 12/07/2016   Essential hypertension 12/07/2016   Hx of adenomatous polyp of colon 03/21/2014   Postop Hyponatremia 08/07/2011   OA (osteoarthritis) of knee 08/05/2011   Pain in limb 05/15/2011    REFERRING PROVIDER: Melina Schools, MD  REFERRING DIAG: M54.51 (ICD-10-CM) - Vertebrogenic low back pain  Rationale for Evaluation and Treatment Rehabilitation  THERAPY DIAG:  Other low back pain  Difficulty in walking, not elsewhere classified  ONSET DATE: 6th of July was in pain so bad I was nearly in tears  SUBJECTIVE:  SUBJECTIVE STATEMENT: "I'm feeling good" "I try not to sleep on my L side with my arm under my head".     PERTINENT HISTORY: DDD, OA, DOE, Rt TKA, IBS & Diverticulitis  PAIN:  Are you having pain? Yes: NPRS scale: 0/10 Pain location:  Pain description:   Aggravating factors:  Relieving factors:   PRECAUTIONS: None  WEIGHT BEARING RESTRICTIONS No  FALLS:  Has patient fallen in last 6 months? No  OCCUPATION: retired  PLOF: Independent  PATIENT GOALS decrease pain, I get so out of breath with activity   OBJECTIVE:   PATIENT SURVEYS:  FOTO 47   POSTURE: anterior pelvic tilt  PALPATION: Spasm in Rt post hip that created concordant pain  LUMBAR ROM:   Active  A/PROM  eval  Flexion WFL  Extension WFL  Right lateral  flexion pain  Left lateral flexion WFL  Right rotation   Left rotation    (Blank rows = not tested)  LOWER EXTREMITY ROM:     Active  Right eval Left eval  Hip flexion    Hip extension    Hip abduction    Hip adduction    Hip internal rotation    Hip external rotation    Knee flexion    Knee extension    Ankle dorsiflexion    Ankle plantarflexion    Ankle inversion    Ankle eversion     (Blank rows = not tested)  LOWER EXTREMITY MMT:    MMT Right 8/21 Left 8/21  Hip flexion    Hip extension    Hip abduction 4 3+  Hip adduction    Hip internal rotation    Hip external rotation    Knee flexion    Knee extension    Ankle dorsiflexion    Ankle plantarflexion    Ankle inversion    Ankle eversion     (Blank rows = not tested)    TODAY'S TREATMENT  9/19 Pt seen for aquatic therapy today.  Treatment took place in water 3.25-4 ft in depth at the [facility] pool. Temp of water was 92.  Pt entered/exited the pool via steps with hand rail indep.  Without support:  Walking forward, back and side stepping Lumbar flexion stretch ; Standing lumbar rotation (began to increase R LBP) At stairs:  hamstring stretch, low back stretch, adductor stretch Ab set with thin square noodle submerged x 10 sec x 5  Holding to yellow noodle: heel/toe raises ; hip openers; Warrior 3 forward SLS leans x 5 each Standing holding to hand rails:  figure 4 stretch Rainbow buoy press downs at side for lower trap activation; then with lateral neck flexion for stretch/ and levator stretch Return to walking forward with reciprocal arm swing  9/12 Pt seen for aquatic therapy today.  Treatment took place in water 3.25-4.5 ft in depth at the [facility]. Temp of water was 92.  Pt entered/exited the pool via steps with hand rail indep. Walking forward, back and side stepping Holding to white barbell: TR; HR/ Marching Standing hip hinge using barbell x 5; x2 with R&L hip  hiking Standing lumbar rotation Standing holding to hand rails: runners stretch hamstring and gastroc; figure 4 stretch; hip flex and quad 3x10-15s hold ea Seated on noodle: pelvic mobilization hip hiking; ant/post tilt; rotation Standing hold to pool side 3.8 ft: abdominal bracing instruction throughout hip openers   Pt requires the buoyancy and hydrostatic pressure of water for support, and to offload joints  by unweighting joint load by at least 50 % in navel deep water and by at least 75-80% in chest to neck deep water.  Viscosity of the water is needed for resistance of strengthening. Water current perturbations provides challenge to standing balance requiring increased core activation.     9/6  Trigger Point Dry Needling, Manual Therapy Treatment:  Initial or subsequent education regarding Trigger Point Dry Needling: Subsequent Did patient give consent to treatment with Trigger Point Dry Needling: Yes TPDN with skilled palpation and monitoring followed by STM to the following muscles: Lt upper trap  Lt first rib depression mobilization  Seated hamstring stretch  Supine figure 4 with small range LTR Hooklying bridge x15 Supine SLR with cues for core engagement x10 each Supine leg circles with cues for core control to keep level pelvis x10 each Dead bug x12   2022-12-30  MANUAL: STM bil upper trap & levator, AP Lt thoracic spine, inf mobs to Lt first rib Upper trap stretch Deep breathing with march, with LAQ Sidelying clams paired with breathing Moist heat- 10 min end of session    PATIENT EDUCATION:  Education details: aquatics progressions Person educated: Patient Education method: Consulting civil engineer, Media planner, Corporate treasurer cues, Verbal cues, Education comprehension: verbalized understanding, returned demonstration, verbal cues required, tactile cues required, and needs further education   HOME EXERCISE PROGRAM: BQCMMAME  ASSESSMENT:  CLINICAL IMPRESSION: Pt reporting  reduction of pain with ADL's to 0.5/10; has met LTG#4.   Pt requires minor cues to not over do it with Rt trunk rotation or hip / back extension as this increases her back pain.  Pain is eliminated with lumbar flexion and LE stretches.  Plan to increase focus on strengthening going forward as tolerated. *10th visit progress note at next visit.      OBJECTIVE IMPAIRMENTS decreased activity tolerance, difficulty walking, decreased ROM, decreased strength, increased muscle spasms, impaired flexibility, improper body mechanics, postural dysfunction, obesity, and pain.   ACTIVITY LIMITATIONS carrying, lifting, bending, standing, squatting, sleeping, stairs, bed mobility, and locomotion level  PARTICIPATION LIMITATIONS: meal prep, cleaning, laundry, shopping, and community activity  [place], Time since onset of injury/illness/exacerbation, and 1-2 comorbidities: DOE, DDD  are also affecting patient's functional outcome.   REHAB POTENTIAL: Good  CLINICAL DECISION MAKING: Stable/uncomplicated  EVALUATION COMPLEXITY: Low   GOALS: Goals reviewed with patient? Yes  SHORT TERM GOALS: Target date: 12/14/2021  Decrease in spasm around hip via use of DN/manual therapy Baseline: Goal status:achieved   LONG TERM GOALS: Target date: 02/01/2022  FOTO goal to be met Baseline:  Goal status: INITIAL  2.  Able to demo gross 5/5 hip abd strength Baseline: significant pain at eval, testing unappropriate Goal status: INITIAL  3.  Able to participate in proper strengthening HEP with understanding of rest breaks and control dyspnea Baseline:  Goal status: INITIAL  4.  Able to complete ADLs pain <=2/10 Baseline:  Goal status: Achieved - 01/08/22   PLAN: PT FREQUENCY: 1-2x/week  PT DURATION: 10 weeks  PLANNED INTERVENTIONS: Therapeutic exercises, Therapeutic activity, Neuromuscular re-education, Balance training, Gait training, Patient/Family education, Self Care, Joint  mobilization, Stair training, Aquatic Therapy, Dry Needling, Electrical stimulation, Spinal mobilization, Cryotherapy, Moist heat, Taping, Ionotophoresis 4mg /ml Dexamethasone, and Manual therapy.  PLAN FOR NEXT SESSION: gross lumbopelvic strength in pool, bending/squatting with use of hip hinge- requires cues for breathing   Kerin Perna, PTA 01/08/22 4:21 PM [facility] [address] [place], [place], [address] Phone: [phone]   Fax:  [phone]   

## 2022-01-09 DIAGNOSIS — G4733 Obstructive sleep apnea (adult) (pediatric): Secondary | ICD-10-CM | POA: Diagnosis not present

## 2022-01-09 DIAGNOSIS — R092 Respiratory arrest: Secondary | ICD-10-CM | POA: Diagnosis not present

## 2022-01-09 DIAGNOSIS — R0902 Hypoxemia: Secondary | ICD-10-CM | POA: Diagnosis not present

## 2022-01-09 DIAGNOSIS — J441 Chronic obstructive pulmonary disease with (acute) exacerbation: Secondary | ICD-10-CM | POA: Diagnosis not present

## 2022-01-10 ENCOUNTER — Ambulatory Visit (HOSPITAL_BASED_OUTPATIENT_CLINIC_OR_DEPARTMENT_OTHER): Payer: Medicare Other | Admitting: Physical Therapy

## 2022-01-10 ENCOUNTER — Encounter (HOSPITAL_BASED_OUTPATIENT_CLINIC_OR_DEPARTMENT_OTHER): Payer: Self-pay

## 2022-01-16 ENCOUNTER — Encounter (HOSPITAL_BASED_OUTPATIENT_CLINIC_OR_DEPARTMENT_OTHER): Payer: Self-pay | Admitting: Physical Therapy

## 2022-01-16 ENCOUNTER — Ambulatory Visit (HOSPITAL_BASED_OUTPATIENT_CLINIC_OR_DEPARTMENT_OTHER): Payer: Medicare Other | Admitting: Physical Therapy

## 2022-01-16 DIAGNOSIS — M5459 Other low back pain: Secondary | ICD-10-CM

## 2022-01-16 DIAGNOSIS — R262 Difficulty in walking, not elsewhere classified: Secondary | ICD-10-CM | POA: Diagnosis not present

## 2022-01-16 NOTE — Therapy (Signed)
OUTPATIENT PHYSICAL THERAPY THORACOLUMBAR Treatment   Progress Note Reporting Period 11/22/21 to 01/16/22  See note below for Objective Data and Assessment of Progress/Goals.     Patient Name: Michelle [name] MRN: [id number] DOB:1946-01-17, 76 y.o., female Today's Date: 01/16/2022   PT End of Session - 01/16/22 1507     Visit Number 10    Number of Visits 17    Date for PT Re-Evaluation 02/01/22    Authorization Type BCBS MCR    PT Start Time 1504    PT Stop Time 1545    PT Time Calculation (min) 41 min    Activity Tolerance Patient tolerated treatment well    Behavior During Therapy WFL for tasks assessed/performed                  Past Medical History:  Diagnosis Date   Acute hypoxemic respiratory failure ([place]) 04/24/2017   Anxiety    Arthritis    "back" (04/24/2017)   Atherosclerosis of coronary artery    Blurry vision, left eye    "YAG on the left; to be repaired" (04/24/2017)   Chondromalacia of knee    right knee   Chronic bronchitis (HCC)    Chronic lower back pain    DDD (degenerative disc disease) 07/24/2011   Osteoarthritis-s/p LTKA 5'73, now right planned   Depression 07/24/2011   tx. depression only   DOE (dyspnea on exertion)    Fatty liver    History of diverticulitis 02/2018   Hx of adenomatous polyp of colon 03/21/2014   Hyperlipidemia    Hypertension    IBS (irritable bowel syndrome)    OSA on CPAP 12/07/2016   Pneumonia 1990s X 1   SOB (shortness of breath)    Vitamin D deficiency    Past Surgical History:  Procedure Laterality Date   APPENDECTOMY  1967   BOTOX INJECTION     "face"   CATARACT EXTRACTION W/ INTRAOCULAR LENS IMPLANT Bilateral 2000s   CESAREAN SECTION  1972; 1975   COLONOSCOPY W/ BIOPSIES AND POLYPECTOMY  "a few"   DILATION AND CURETTAGE OF UTERUS     HYSTEROSCOPY WITH D & C  08/29/2006   and resection of endometrial polyp/notes 09/10/2010   IR IMAGING GUIDED PORT INSERTION  01/18/2021   JOINT REPLACEMENT     KNEE  ARTHROSCOPY Bilateral    NASAL SEPTUM SURGERY  1972   OOPHORECTOMY Right 1994   TOTAL KNEE ARTHROPLASTY  08/05/2011   Procedure: TOTAL KNEE ARTHROPLASTY;  Surgeon: Gearlean Alf, MD;  Location: [facility];  Service: Orthopedics;  Laterality: Right;   TOTAL KNEE ARTHROPLASTY Left 05/2009   TUBAL LIGATION  1975   Patient Active Problem List   Diagnosis Date Noted   Port-A-Cath in place 08/27/2021   Macrocytosis without anemia 08/30/2020   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   Cough 05/14/2017   ETOH abuse 04/27/2017   Abnormal LFTs 04/26/2017   Thrombocytopenia ([place]) 04/26/2017   Dyspnea on exertion 03/18/2017   Morbid obesity ([place]) 03/18/2017   Obstructive sleep apnea 12/07/2016   Essential hypertension 12/07/2016   Hx of adenomatous polyp of colon 03/21/2014   Postop Hyponatremia 08/07/2011   OA (osteoarthritis) of knee 08/05/2011   Pain in limb 05/15/2011    REFERRING PROVIDER: Melina Schools, MD  REFERRING DIAG: M54.51 (ICD-10-CM) - Vertebrogenic low back pain  Rationale for Evaluation and Treatment Rehabilitation  THERAPY DIAG:  Other low back pain  Difficulty in walking, not elsewhere classified  ONSET DATE: 6th of July  was in pain so bad I was nearly in tears  SUBJECTIVE:                                                                                                                                                                                           SUBJECTIVE STATEMENT: "Had trouble walking this weekend in [facility]. Back is really hurting today.".     PERTINENT HISTORY: DDD, OA, DOE, Rt TKA, IBS & Diverticulitis  PAIN:  Are you having pain? Yes: NPRS scale: 7/10 Pain location: LB Pain description:  ache Aggravating factors: walking Relieving factors: resting   PRECAUTIONS: None  WEIGHT BEARING RESTRICTIONS No  FALLS:  Has patient fallen in last 6 months? No  OCCUPATION: retired  PLOF: Independent  PATIENT GOALS decrease pain, I get  so out of breath with activity   OBJECTIVE:   PATIENT SURVEYS:  FOTO 47 01/16/22: FOTO 50  POSTURE: anterior pelvic tilt  PALPATION: Spasm in Rt post hip that created concordant pain  LUMBAR ROM:   Active  A/PROM  eval 9/27  Flexion WFL   Extension WFL   Right lateral flexion pain pain  Left lateral flexion WFL pain  Right rotation    Left rotation     (Blank rows = not tested)  LOWER EXTREMITY ROM:     Active  Right eval Left eval  Hip flexion    Hip extension    Hip abduction    Hip adduction    Hip internal rotation    Hip external rotation    Knee flexion    Knee extension    Ankle dorsiflexion    Ankle plantarflexion    Ankle inversion    Ankle eversion     (Blank rows = not tested)  LOWER EXTREMITY MMT:    MMT Right 8/21 Left 8/21 Right/Left 9/27  Hip flexion     Hip extension     Hip abduction 4 3+ 4+ - 4  Hip adduction     Hip internal rotation     Hip external rotation     Knee flexion     Knee extension     Ankle dorsiflexion     Ankle plantarflexion     Ankle inversion     Ankle eversion      (Blank rows = not tested)    TODAY'S TREATMENT   9/27 Pt seen for aquatic therapy today.  Treatment took place in water 3.25-4.5 ft in depth at the [facility]. Temp of water was 92.  Pt entered/exited the pool via steps with hand rail indep. Walking forward, back and side stepping L  stretch using steps and handrail; runners stretch for gastroc and hamstring Standing hip hinge using barbell 2x5 Standing lumbar rotation Seated on noodle: pelvic mobilization hip hiking; ant/post tilt; rotation Standing hold to pool side 3.8 ft: abdominal bracing instruction throughout hip openers Rainbow buoy press downs at side for lower trap activation; then with lateral neck flexion for stretch/ and levator stretch Holding to white barbell: TR; HR/ Marching  Pt requires the buoyancy and hydrostatic pressure of water for support, and to  offload joints by unweighting joint load by at least 50 % in navel deep water and by at least 75-80% in chest to neck deep water.  Viscosity of the water is needed for resistance of strengthening. Water current perturbations provides challenge to standing balance requiring increased core activation.  Objective testing     9/6  Trigger Point Dry Needling, Manual Therapy Treatment:  Initial or subsequent education regarding Trigger Point Dry Needling: Subsequent Did patient give consent to treatment with Trigger Point Dry Needling: Yes TPDN with skilled palpation and monitoring followed by STM to the following muscles: Lt upper trap  Lt first rib depression mobilization  Seated hamstring stretch  Supine figure 4 with small range LTR Hooklying bridge x15 Supine SLR with cues for core engagement x10 each Supine leg circles with cues for core control to keep level pelvis x10 each Dead bug x12   2023/01/02  MANUAL: STM bil upper trap & levator, AP Lt thoracic spine, inf mobs to Lt first rib Upper trap stretch Deep breathing with march, with LAQ Sidelying clams paired with breathing Moist heat- 10 min end of session    PATIENT EDUCATION:  Education details: aquatics progressions Person educated: Patient Education method: Consulting civil engineer, Media planner, Corporate treasurer cues, Verbal cues, Education comprehension: verbalized understanding, returned demonstration, verbal cues required, tactile cues required, and needs further education   HOME EXERCISE PROGRAM: BQCMMAME  ASSESSMENT:  CLINICAL IMPRESSION: Pt with increased walking over w/e with reports of increased LBP today.  She reports compliance with land based HEP.  PN: foto improved to 50 but not yet reached goal set. She has also had an improvement in hip abduction strength as noted in chart above. She has met her STG and onde LTG and has progressed towards meeting the remainding.  She is have a flare today for increased amb distance over  weekend as she has not improved toleration to amb to meet her functional requirement. She conintues to need vc and some demonstration for proper execution of tasks as well as for breathing. She will continue to benefit form aquatic skiled PT to progress further toward improved functional mobility and safety with adl's     OBJECTIVE IMPAIRMENTS decreased activity tolerance, difficulty walking, decreased ROM, decreased strength, increased muscle spasms, impaired flexibility, improper body mechanics, postural dysfunction, obesity, and pain.   ACTIVITY LIMITATIONS carrying, lifting, bending, standing, squatting, sleeping, stairs, bed mobility, and locomotion level  PARTICIPATION LIMITATIONS: meal prep, cleaning, laundry, shopping, and community activity  [place], Time since onset of injury/illness/exacerbation, and 1-2 comorbidities: DOE, DDD  are also affecting patient's functional outcome.   REHAB POTENTIAL: Good  CLINICAL DECISION MAKING: Stable/uncomplicated  EVALUATION COMPLEXITY: Low   GOALS: Goals reviewed with patient? Yes  SHORT TERM GOALS: Target date: 12/14/2021  Decrease in spasm around hip via use of DN/manual therapy Baseline: Goal status:achieved   LONG TERM GOALS: Target date: 02/01/2022  FOTO goal to be met (58) Baseline: 01/16/22 Goal status: ongoing  2.  Able to demo gross 5/5  hip abd strength Baseline: significant pain at eval, testing unappropriate Goal status: Ongoing  3.  Able to participate in proper strengthening HEP with understanding of rest breaks and control dyspnea Baseline:  Goal status: Ongoing  4.  Able to complete ADLs pain <=2/10 Baseline:  Goal status: Achieved - 01/08/22   PLAN: PT FREQUENCY: 1-2x/week  PT DURATION: 10 weeks  PLANNED INTERVENTIONS: Therapeutic exercises, Therapeutic activity, Neuromuscular re-education, Balance training, Gait training, Patient/Family education, Self Care, Joint mobilization, Stair  training, Aquatic Therapy, Dry Needling, Electrical stimulation, Spinal mobilization, Cryotherapy, Moist heat, Taping, Ionotophoresis 24m/ml Dexamethasone, and Manual therapy.  PLAN FOR NEXT SESSION: gross lumbopelvic strength in pool, bending/squatting with use of hip hinge- requires cues for breathing   MStanton Kidney(Tharon Aquas Adelyn Roscher MPT 01/16/22 7:03 PM [facility] [address]GC[address] [place] [address]Phone: [phone]  Fax:  [phone]

## 2022-01-17 DIAGNOSIS — L570 Actinic keratosis: Secondary | ICD-10-CM | POA: Diagnosis not present

## 2022-01-17 DIAGNOSIS — I788 Other diseases of capillaries: Secondary | ICD-10-CM | POA: Diagnosis not present

## 2022-01-17 DIAGNOSIS — L720 Epidermal cyst: Secondary | ICD-10-CM | POA: Diagnosis not present

## 2022-01-17 DIAGNOSIS — L820 Inflamed seborrheic keratosis: Secondary | ICD-10-CM | POA: Diagnosis not present

## 2022-01-22 ENCOUNTER — Encounter (HOSPITAL_BASED_OUTPATIENT_CLINIC_OR_DEPARTMENT_OTHER): Payer: Self-pay | Admitting: Physical Therapy

## 2022-01-22 ENCOUNTER — Ambulatory Visit (HOSPITAL_BASED_OUTPATIENT_CLINIC_OR_DEPARTMENT_OTHER): Payer: Medicare Other | Attending: Orthopedic Surgery | Admitting: Physical Therapy

## 2022-01-22 DIAGNOSIS — R262 Difficulty in walking, not elsewhere classified: Secondary | ICD-10-CM | POA: Insufficient documentation

## 2022-01-22 DIAGNOSIS — M5459 Other low back pain: Secondary | ICD-10-CM | POA: Diagnosis not present

## 2022-01-22 NOTE — Therapy (Signed)
OUTPATIENT PHYSICAL THERAPY THORACOLUMBAR Treatment   Patient Name: Michelle [name] MRN: [id number] DOB:[birth year], 76 y.o., female Today's Date: 01/22/2022   PT End of Session - 01/22/22 1458     Visit Number 11    Number of Visits 17    Date for PT Re-Evaluation 02/01/22    Authorization Type BCBS MCR    PT Start Time 1556   pt arrived late   PT Stop Time 1630    PT Time Calculation (min) 34 min    Activity Tolerance Patient tolerated treatment well    Behavior During Therapy WFL for tasks assessed/performed                  Past Medical History:  Diagnosis Date   Acute hypoxemic respiratory failure ([place]) 04/24/2017   Anxiety    Arthritis    "back" (04/24/2017)   Atherosclerosis of coronary artery    Blurry vision, left eye    "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 depression only   DOE (dyspnea on exertion)    Fatty liver    History of diverticulitis 02/2018   Hx of adenomatous polyp of colon 03/21/2014   Hyperlipidemia    Hypertension    IBS (irritable bowel syndrome)    OSA on CPAP 12/07/2016   Pneumonia 1990s X 1   SOB (shortness of breath)    Vitamin D deficiency    Past Surgical History:  Procedure Laterality Date   APPENDECTOMY  1967   BOTOX INJECTION     "face"   CATARACT EXTRACTION W/ INTRAOCULAR LENS IMPLANT Bilateral 2000s   CESAREAN SECTION  1972; 1975   COLONOSCOPY W/ BIOPSIES AND POLYPECTOMY  "a few"   DILATION AND CURETTAGE OF UTERUS     HYSTEROSCOPY WITH D & C  08/29/2006   and resection of endometrial polyp/notes 09/10/2010   IR IMAGING GUIDED PORT INSERTION  01/18/2021   JOINT REPLACEMENT     KNEE ARTHROSCOPY Bilateral    NASAL SEPTUM SURGERY  1972   OOPHORECTOMY Right 1994   TOTAL KNEE ARTHROPLASTY   08/05/2011   Procedure: TOTAL KNEE ARTHROPLASTY;  Surgeon: Gearlean Alf, MD;  Location: [facility];  Service: Orthopedics;  Laterality: Right;   TOTAL KNEE ARTHROPLASTY Left 05/2009   TUBAL LIGATION  1975   Patient Active Problem List   Diagnosis Date Noted   Port-A-Cath in place 08/27/2021   Macrocytosis without anemia 08/30/2020   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   Cough 05/14/2017   ETOH abuse 04/27/2017   Abnormal LFTs 04/26/2017   Thrombocytopenia ([place]) 04/26/2017   Dyspnea on exertion 03/18/2017   Morbid obesity ([place]) 03/18/2017   Obstructive sleep apnea 12/07/2016   Essential hypertension 12/07/2016   Hx of adenomatous polyp of colon 03/21/2014   Postop Hyponatremia 08/07/2011   OA (osteoarthritis) of knee 08/05/2011   Pain in limb 05/15/2011    REFERRING PROVIDER: Melina Schools, MD  REFERRING DIAG: M54.51 (ICD-10-CM) - Vertebrogenic low back pain  Rationale for Evaluation and Treatment Rehabilitation  THERAPY DIAG:  Other low back pain  Difficulty in walking, not elsewhere classified  ONSET DATE: 6th of July was in pain so bad I was nearly in tears  SUBJECTIVE:  SUBJECTIVE STATEMENT: "I'm feeling much better than last week".    PERTINENT HISTORY: DDD, OA, DOE, Rt TKA, IBS & Diverticulitis  PAIN:  Are you having pain? No: NPRS scale: 0/10 Pain location:  Pain description:  Aggravating factors: walking Relieving factors: resting   PRECAUTIONS: None  WEIGHT BEARING RESTRICTIONS No  FALLS:  Has patient fallen in last 6 months? No  OCCUPATION: retired  PLOF: Independent  PATIENT GOALS decrease pain, I get so out of breath with activity   OBJECTIVE:   PATIENT SURVEYS:  FOTO 47 01/16/22: FOTO 50  POSTURE: anterior pelvic tilt  PALPATION: Spasm in Rt  post hip that created concordant pain  LUMBAR ROM:   Active  A/PROM  eval 9/27  Flexion WFL   Extension WFL   Right lateral flexion pain pain  Left lateral flexion WFL pain  Right rotation    Left rotation     (Blank rows = not tested)  LOWER EXTREMITY ROM:     Active  Right eval Left eval  Hip flexion    Hip extension    Hip abduction    Hip adduction    Hip internal rotation    Hip external rotation    Knee flexion    Knee extension    Ankle dorsiflexion    Ankle plantarflexion    Ankle inversion    Ankle eversion     (Blank rows = not tested)  LOWER EXTREMITY MMT:    MMT Right 8/21 Left 8/21 Right/Left 9/27  Hip flexion     Hip extension     Hip abduction 4 3+ 4+ - 4  Hip adduction     Hip internal rotation     Hip external rotation     Knee flexion     Knee extension     Ankle dorsiflexion     Ankle plantarflexion     Ankle inversion     Ankle eversion      (Blank rows = not tested)    TODAY'S TREATMENT   10/3 Pt seen for aquatic therapy today.  Treatment took place in water 3.25-4.5 ft in depth at the [facility]. Temp of water was 92.  Pt entered/exited the pool via steps with hand rail indep.  Walking forward, back and side stepping Standing hip hinge using barbell x 10 Holding white barbell:  high knee forward marching; heel raises (narrow / wide base of support x 10 each); hip openers Standing lumbar rotation with hands on kick board on surface Staggered stance with kick board push pull  Thin square noodle pull down x 10 Attempted back against wall to get noodle to support foot for Hamstring stretch- couldn't reach LE At stairs: hamstring stretch, adductor stretch, side stretch.   Pt requires the buoyancy and hydrostatic pressure of water for support, and to offload joints by unweighting joint load by at least 50 % in navel deep water and by at least 75-80% in chest to neck deep water.  Viscosity of the water is needed for  resistance of strengthening. Water current perturbations provides challenge to standing balance requiring increased core activation.  9/6  Trigger Point Dry Needling, Manual Therapy Treatment:  Initial or subsequent education regarding Trigger Point Dry Needling: Subsequent Did patient give consent to treatment with Trigger Point Dry Needling: Yes TPDN with skilled palpation and monitoring followed by STM to the following muscles: Lt upper trap  Lt first rib depression mobilization  Seated hamstring stretch  Supine figure 4 with  small range LTR Hooklying bridge x15 Supine SLR with cues for core engagement x10 each Supine leg circles with cues for core control to keep level pelvis x10 each Dead bug x12   2023/01/18  MANUAL: STM bil upper trap & levator, AP Lt thoracic spine, inf mobs to Lt first rib Upper trap stretch Deep breathing with march, with LAQ Sidelying clams paired with breathing Moist heat- 10 min end of session    PATIENT EDUCATION:  Education details: aquatics progressions Person educated: Patient Education method: Consulting civil engineer, Media planner, Corporate treasurer cues, Verbal cues, Education comprehension: verbalized understanding, returned demonstration, verbal cues required, tactile cues required, and needs further education   HOME EXERCISE PROGRAM: BQCMMAME  ASSESSMENT:  CLINICAL IMPRESSION: Pt's session was shortened due to her late arrival.  She tolerated all exercise without increase in pain. Overall, reporting improvement in symptoms.  She will continue to benefit form aquatic skiled PT to progress further toward improved functional mobility and safety with ADL's.  Began d/c planning; discussed walking program at [place] near her home.      OBJECTIVE IMPAIRMENTS decreased activity tolerance, difficulty walking, decreased ROM, decreased strength, increased muscle spasms, impaired flexibility, improper body mechanics, postural dysfunction, obesity, and pain.    ACTIVITY LIMITATIONS carrying, lifting, bending, standing, squatting, sleeping, stairs, bed mobility, and locomotion level  PARTICIPATION LIMITATIONS: meal prep, cleaning, laundry, shopping, and community activity  [place], Time since onset of injury/illness/exacerbation, and 1-2 comorbidities: DOE, DDD  are also affecting patient's functional outcome.   REHAB POTENTIAL: Good  CLINICAL DECISION MAKING: Stable/uncomplicated  EVALUATION COMPLEXITY: Low   GOALS: Goals reviewed with patient? Yes  SHORT TERM GOALS: Target date: 12/14/2021  Decrease in spasm around hip via use of DN/manual therapy Baseline: Goal status:achieved   LONG TERM GOALS: Target date: 02/01/2022  FOTO goal to be met (58) Baseline: 01/16/22 Goal status: ongoing  2.  Able to demo gross 5/5 hip abd strength Baseline: significant pain at eval, testing unappropriate Goal status: Ongoing  3.  Able to participate in proper strengthening HEP with understanding of rest breaks and control dyspnea Baseline:  Goal status: Ongoing  4.  Able to complete ADLs pain <=2/10 Baseline:  Goal status: Achieved - 01/08/22   PLAN: PT FREQUENCY: 1-2x/week  PT DURATION: 10 weeks  PLANNED INTERVENTIONS: Therapeutic exercises, Therapeutic activity, Neuromuscular re-education, Balance training, Gait training, Patient/Family education, Self Care, Joint mobilization, Stair training, Aquatic Therapy, Dry Needling, Electrical stimulation, Spinal mobilization, Cryotherapy, Moist heat, Taping, Ionotophoresis 29m/ml Dexamethasone, and Manual therapy.  PLAN FOR NEXT SESSION: gross lumbopelvic strength in pool, bending/squatting with use of hip hinge- requires cues for breathing   JKerin Perna PTA 01/22/22 3:30 PM [facility] [address] [address]Phone: [phone]  Fax:  [phone]

## 2022-01-24 ENCOUNTER — Encounter (HOSPITAL_BASED_OUTPATIENT_CLINIC_OR_DEPARTMENT_OTHER): Payer: Self-pay | Admitting: Physical Therapy

## 2022-01-24 ENCOUNTER — Ambulatory Visit (HOSPITAL_BASED_OUTPATIENT_CLINIC_OR_DEPARTMENT_OTHER): Payer: Medicare Other | Admitting: Physical Therapy

## 2022-01-24 DIAGNOSIS — R262 Difficulty in walking, not elsewhere classified: Secondary | ICD-10-CM | POA: Diagnosis not present

## 2022-01-24 DIAGNOSIS — M5459 Other low back pain: Secondary | ICD-10-CM | POA: Diagnosis not present

## 2022-01-24 NOTE — Therapy (Signed)
OUTPATIENT PHYSICAL THERAPY THORACOLUMBAR Treatment   Patient Name: Michelle [name] MRN: [id number] DOB:[birth year], 76 y.o., female Today's Date: 01/24/2022   PT End of Session - 01/24/22 1532     Visit Number 12    Number of Visits 17    Date for PT Re-Evaluation 02/01/22    Authorization Type BCBS MCR    PT Start Time 1529    PT Stop Time 1612    PT Time Calculation (min) 43 min    Activity Tolerance Patient tolerated treatment well    Behavior During Therapy WFL for tasks assessed/performed                  Past Medical History:  Diagnosis Date   Acute hypoxemic respiratory failure ([place]) 04/24/2017   Anxiety    Arthritis    "back" (04/24/2017)   Atherosclerosis of coronary artery    Blurry vision, left eye    "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 depression only   DOE (dyspnea on exertion)    Fatty liver    History of diverticulitis 02/2018   Hx of adenomatous polyp of colon 03/21/2014   Hyperlipidemia    Hypertension    IBS (irritable bowel syndrome)    OSA on CPAP 12/07/2016   Pneumonia 1990s X 1   SOB (shortness of breath)    Vitamin D deficiency    Past Surgical History:  Procedure Laterality Date   APPENDECTOMY  1967   BOTOX INJECTION     "face"   CATARACT EXTRACTION W/ INTRAOCULAR LENS IMPLANT Bilateral 2000s   CESAREAN SECTION  1972; 1975   COLONOSCOPY W/ BIOPSIES AND POLYPECTOMY  "a few"   DILATION AND CURETTAGE OF UTERUS     HYSTEROSCOPY WITH D & C  08/29/2006   and resection of endometrial polyp/notes 09/10/2010   IR IMAGING GUIDED PORT INSERTION  01/18/2021   JOINT REPLACEMENT     KNEE ARTHROSCOPY Bilateral    NASAL SEPTUM SURGERY  1972   OOPHORECTOMY Right 1994   TOTAL KNEE ARTHROPLASTY  08/05/2011   Procedure:  TOTAL KNEE ARTHROPLASTY;  Surgeon: Gearlean Alf, MD;  Location: [facility];  Service: Orthopedics;  Laterality: Right;   TOTAL KNEE ARTHROPLASTY Left 05/2009   TUBAL LIGATION  1975   Patient Active Problem List   Diagnosis Date Noted   Port-A-Cath in place 08/27/2021   Macrocytosis without anemia 08/30/2020   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   Cough 05/14/2017   ETOH abuse 04/27/2017   Abnormal LFTs 04/26/2017   Thrombocytopenia ([place]) 04/26/2017   Dyspnea on exertion 03/18/2017   Morbid obesity ([place]) 03/18/2017   Obstructive sleep apnea 12/07/2016   Essential hypertension 12/07/2016   Hx of adenomatous polyp of colon 03/21/2014   Postop Hyponatremia 08/07/2011   OA (osteoarthritis) of knee 08/05/2011   Pain in limb 05/15/2011    REFERRING PROVIDER: Melina Schools, MD  REFERRING DIAG: M54.51 (ICD-10-CM) - Vertebrogenic low back pain  Rationale for Evaluation and Treatment Rehabilitation  THERAPY DIAG:  Other low back pain  Difficulty in walking, not elsewhere classified  ONSET DATE: 6th of July was in pain so bad I was nearly in tears  SUBJECTIVE:  SUBJECTIVE STATEMENT: "Doing well, but my left shoulder is still bothering me"     PERTINENT HISTORY: DDD, OA, DOE, Rt TKA, IBS & Diverticulitis  PAIN:  Are you having pain? Yes : NPRS scale: 4-6/10 Pain location: L shoulder (points to deltoid) Pain description: achy Aggravating factors: lifting arm, certain motions Relieving factors: ?   PRECAUTIONS: None  WEIGHT BEARING RESTRICTIONS No  FALLS:  Has patient fallen in last 6 months? No  OCCUPATION: retired  PLOF: Independent  PATIENT GOALS decrease pain, I get so out of breath with activity   OBJECTIVE:   PATIENT SURVEYS:  FOTO 47 01/16/22: FOTO 50  POSTURE:  anterior pelvic tilt  PALPATION: Spasm in Rt post hip that created concordant pain  LUMBAR ROM:   Active  A/PROM  eval 9/27  Flexion WFL   Extension WFL   Right lateral flexion pain pain  Left lateral flexion WFL pain  Right rotation    Left rotation     (Blank rows = not tested)  LOWER EXTREMITY ROM:     Active  Right eval Left eval  Hip flexion    Hip extension    Hip abduction    Hip adduction    Hip internal rotation    Hip external rotation    Knee flexion    Knee extension    Ankle dorsiflexion    Ankle plantarflexion    Ankle inversion    Ankle eversion     (Blank rows = not tested)  LOWER EXTREMITY MMT:    MMT Right 8/21 Left 8/21 Right/Left 9/27  Hip flexion     Hip extension     Hip abduction 4 3+ 4+ - 4  Hip adduction     Hip internal rotation     Hip external rotation     Knee flexion     Knee extension     Ankle dorsiflexion     Ankle plantarflexion     Ankle inversion     Ankle eversion      (Blank rows = not tested)    TODAY'S TREATMENT  10/5 Pt seen for aquatic therapy today.  Treatment took place in water 3.25-4.5 ft in depth at the [facility]. Temp of water was 92.  Pt entered/exited the pool via steps with hand rail indep.  Walking forward, back and side stepping Thin square noodle pull down x 10 At stairs: hamstring stretch, adductor stretch, side stretch with hand on head; fig 4 piriformis stretch  Standing hip hinge using barbell x 10, 3-5 sec hold Holding white barbell:  high knee forward marching; heel raises (narrow / wide base of support x 10 each);  Wide stance with kick board push/pull  Standing lumbar rotation with hands on kick board on surface; kick board push downs with ab set High knee marching forward with reciprocal arm punch under surface of water (began to bother L shoulder) Holding yellow hand buoys at side - forward/ backward gait Side step squat with arm add Wall push up/off  Pt requires  the buoyancy and hydrostatic pressure of water for support, and to offload joints by unweighting joint load by at least 50 % in navel deep water and by at least 75-80% in chest to neck deep water.  Viscosity of the water is needed for resistance of strengthening. Water current perturbations provides challenge to standing balance requiring increased core activation.  10/3 Pt seen for aquatic therapy today.  Treatment took place in water 3.25-4.5 ft in  depth at the [facility] pool. Temp of water was 92.  Pt entered/exited the pool via steps with hand rail indep.  Walking forward, back and side stepping Standing hip hinge using barbell x 10 Holding white barbell:  high knee forward marching; heel raises (narrow / wide base of support x 10 each); hip openers Standing lumbar rotation with hands on kick board on surface Staggered stance with kick board push pull  Thin square noodle pull down x 10 Attempted back against wall to get noodle to support foot for Hamstring stretch- couldn't reach LE At stairs: hamstring stretch, adductor stretch, side stretch.  9/6  Trigger Point Dry Needling, Manual Therapy Treatment:  Initial or subsequent education regarding Trigger Point Dry Needling: Subsequent Did patient give consent to treatment with Trigger Point Dry Needling: Yes TPDN with skilled palpation and monitoring followed by STM to the following muscles: Lt upper trap  Lt first rib depression mobilization  Seated hamstring stretch  Supine figure 4 with small range LTR Hooklying bridge x15 Supine SLR with cues for core engagement x10 each Supine leg circles with cues for core control to keep level pelvis x10 each Dead bug x12   26-Dec-2022  MANUAL: STM bil upper trap & levator, AP Lt thoracic spine, inf mobs to Lt first rib Upper trap stretch Deep breathing with march, with LAQ Sidelying clams paired with breathing Moist heat- 10 min end of session    PATIENT EDUCATION:  Education  details: aquatics progressions Person educated: Patient Education method: Consulting civil engineer, Media planner, Corporate treasurer cues, Verbal cues, Education comprehension: verbalized understanding, returned demonstration, verbal cues required, tactile cues required, and needs further education   HOME EXERCISE PROGRAM: BQCMMAME  ASSESSMENT:  CLINICAL IMPRESSION:   She tolerated all exercise well, with the exception of front punching motion in water with bothered her Lt shoulder. Otherwise Lt shoulder pain reduced to 0/10 during session.  Overall, reporting improvement in symptoms.  POC ending soon - reassess need for additional visits vs d/c.   Continue d/c planning; plans to start walking program at [place] near her home when done with PT. Plan to issue aquatic HEP at next land visit. May be candidate for transition program.      OBJECTIVE IMPAIRMENTS decreased activity tolerance, difficulty walking, decreased ROM, decreased strength, increased muscle spasms, impaired flexibility, improper body mechanics, postural dysfunction, obesity, and pain.   ACTIVITY LIMITATIONS carrying, lifting, bending, standing, squatting, sleeping, stairs, bed mobility, and locomotion level  PARTICIPATION LIMITATIONS: meal prep, cleaning, laundry, shopping, and community activity  [place], Time since onset of injury/illness/exacerbation, and 1-2 comorbidities: DOE, DDD  are also affecting patient's functional outcome.   REHAB POTENTIAL: Good  CLINICAL DECISION MAKING: Stable/uncomplicated  EVALUATION COMPLEXITY: Low   GOALS: Goals reviewed with patient? Yes  SHORT TERM GOALS: Target date: 12/14/2021  Decrease in spasm around hip via use of DN/manual therapy Baseline: Goal status:achieved   LONG TERM GOALS: Target date: 02/01/2022  FOTO goal to be met (58) Baseline: 01/16/22 Goal status: ongoing  2.  Able to demo gross 5/5 hip abd strength Baseline: significant pain at eval, testing  unappropriate Goal status: Ongoing  3.  Able to participate in proper strengthening HEP with understanding of rest breaks and control dyspnea Baseline:  Goal status: Ongoing  4.  Able to complete ADLs pain <=2/10 Baseline:  Goal status: Achieved - 01/08/22   PLAN: PT FREQUENCY: 1-2x/week  PT DURATION: 10 weeks  PLANNED INTERVENTIONS: Therapeutic exercises, Therapeutic activity, Neuromuscular re-education, Balance training, Gait  training, Patient/Family education, Self Care, Joint mobilization, Stair training, Aquatic Therapy, Dry Needling, Electrical stimulation, Spinal mobilization, Cryotherapy, Moist heat, Taping, Ionotophoresis 9m/ml Dexamethasone, and Manual therapy.  PLAN FOR NEXT SESSION: gross lumbopelvic strength in pool, bending/squatting with use of hip hinge- requires cues for breathing   JKerin Perna PTA 01/24/22 6:08 PM [facility] [address] [address]Phone: [phone]  Fax:  [phone]

## 2022-01-28 ENCOUNTER — Inpatient Hospital Stay: Payer: Medicare Other | Attending: Hematology and Oncology

## 2022-01-28 ENCOUNTER — Other Ambulatory Visit: Payer: Self-pay | Admitting: *Deleted

## 2022-01-28 ENCOUNTER — Telehealth: Payer: Self-pay | Admitting: *Deleted

## 2022-01-28 ENCOUNTER — Inpatient Hospital Stay: Payer: Medicare Other

## 2022-01-28 DIAGNOSIS — Z95828 Presence of other vascular implants and grafts: Secondary | ICD-10-CM

## 2022-01-28 LAB — COMPREHENSIVE METABOLIC PANEL
ALT: 25 U/L (ref 0–44)
AST: 21 U/L (ref 15–41)
Albumin: 3.8 g/dL (ref 3.5–5.0)
Alkaline Phosphatase: 83 U/L (ref 38–126)
Anion gap: 7 (ref 5–15)
BUN: 23 mg/dL (ref 8–23)
CO2: 25 mmol/L (ref 22–32)
Calcium: 8.6 mg/dL — ABNORMAL LOW (ref 8.9–10.3)
Chloride: 107 mmol/L (ref 98–111)
Creatinine, Ser: 0.94 mg/dL (ref 0.44–1.00)
GFR, Estimated: >60 mL/min (ref >60)
Glucose, Bld: 180 mg/dL — ABNORMAL HIGH (ref 70–99)
Potassium: 3.9 mmol/L (ref 3.5–5.1)
Sodium: 139 mmol/L (ref 135–145)
Total Bilirubin: 0.6 mg/dL (ref 0.3–1.2)
Total Protein: 6.9 g/dL (ref 6.5–8.1)

## 2022-01-28 LAB — CBC WITH DIFFERENTIAL/PLATELET
Abs Immature Granulocytes: 0.02 10*3/uL (ref 0.00–0.07)
Basophils Absolute: 0.1 10*3/uL (ref 0.0–0.1)
Basophils Relative: 1 %
Eosinophils Absolute: 0.4 10*3/uL (ref 0.0–0.5)
Eosinophils Relative: 7 %
HCT: 39.9 % (ref 36.0–46.0)
Hemoglobin: 14.1 g/dL (ref 12.0–15.0)
Immature Granulocytes: 0 %
Lymphocytes Relative: 29 %
Lymphs Abs: 1.6 10*3/uL (ref 0.7–4.0)
MCH: 35.3 pg — ABNORMAL HIGH (ref 26.0–34.0)
MCHC: 35.3 g/dL (ref 30.0–36.0)
MCV: 99.8 fL (ref 80.0–100.0)
Monocytes Absolute: 0.6 10*3/uL (ref 0.1–1.0)
Monocytes Relative: 11 %
Neutro Abs: 2.8 10*3/uL (ref 1.7–7.7)
Neutrophils Relative %: 52 %
Platelets: 187 10*3/uL (ref 150–400)
RBC: 4 MIL/uL (ref 3.87–5.11)
RDW: 12.2 % (ref 11.5–15.5)
WBC: 5.4 10*3/uL (ref 4.0–10.5)
nRBC: 0 % (ref 0.0–0.2)

## 2022-01-28 MED ORDER — HEPARIN SOD (PORK) LOCK FLUSH 100 UNIT/ML IV SOLN
500.0000 [IU] | Freq: Once | INTRAVENOUS | Status: DC
  Administered 2022-01-28: 500 [IU]

## 2022-01-28 MED ORDER — SODIUM CHLORIDE 0.9% FLUSH
10.0000 mL | Freq: Once | INTRAVENOUS | Status: AC
  Administered 2022-01-28: 10 mL

## 2022-01-28 NOTE — Telephone Encounter (Signed)
Clarification per MD- therapeutic phlebotomy if HCT >36 and to maintain ferritin < 50.  Michelle [name]

## 2022-01-28 NOTE — Progress Notes (Signed)
Michelle [name] presents today for phlebotomy per MD orders ferritin <50 and HCT >36 Phlebotomy procedure started via port at 1650 and ended at 1800. 270 grams removed. Patient observed for 30 minutes after procedure without any incident.  Food and drink provided Patient tolerated procedure well. IV needle removed intact.

## 2022-01-29 ENCOUNTER — Encounter (HOSPITAL_BASED_OUTPATIENT_CLINIC_OR_DEPARTMENT_OTHER): Payer: Self-pay | Admitting: Physical Therapy

## 2022-01-29 ENCOUNTER — Ambulatory Visit (HOSPITAL_BASED_OUTPATIENT_CLINIC_OR_DEPARTMENT_OTHER): Payer: Medicare Other | Admitting: Physical Therapy

## 2022-01-29 DIAGNOSIS — M5459 Other low back pain: Secondary | ICD-10-CM

## 2022-01-29 DIAGNOSIS — R262 Difficulty in walking, not elsewhere classified: Secondary | ICD-10-CM

## 2022-01-29 LAB — FERRITIN: Ferritin: 91 ng/mL (ref 11–307)

## 2022-01-29 NOTE — Therapy (Signed)
OUTPATIENT PHYSICAL THERAPY THORACOLUMBAR Treatment   Patient Name: Michelle [name] MRN: [id number] DOB:06/15/1945, 76 y.o., female Today's Date: 01/29/2022   PT End of Session - 01/29/22 1327     Visit Number 13    Number of Visits 25    Date for PT Re-Evaluation 03/15/22    Authorization Type BCBS MCR    PT Start Time 1325    PT Stop Time 1408    PT Time Calculation (min) 43 min    Activity Tolerance Patient tolerated treatment well    Behavior During Therapy WFL for tasks assessed/performed                  Past Medical History:  Diagnosis Date   Acute hypoxemic respiratory failure ([place]) 04/24/2017   Anxiety    Arthritis    "back" (04/24/2017)   Atherosclerosis of coronary artery    Blurry vision, left eye    "YAG on the left; to be repaired" (04/24/2017)   Chondromalacia of knee    right knee   Chronic bronchitis ([facility])    Chronic lower back pain    DDD (degenerative disc disease) 07/24/2011   Osteoarthritis-s/p LTKA 6'57, now right planned   Depression 07/24/2011   tx. depression only   DOE (dyspnea on exertion)    Fatty liver    History of diverticulitis 02/2018   Hx of adenomatous polyp of colon 03/21/2014   Hyperlipidemia    Hypertension    IBS (irritable bowel syndrome)    OSA on CPAP 12/07/2016   Pneumonia 1990s X 1   SOB (shortness of breath)    Vitamin D deficiency    Past Surgical History:  Procedure Laterality Date   APPENDECTOMY  1967   BOTOX INJECTION     "face"   CATARACT EXTRACTION W/ INTRAOCULAR LENS IMPLANT Bilateral 2000s   CESAREAN SECTION  1972; 1975   COLONOSCOPY W/ BIOPSIES AND POLYPECTOMY  "a few"   DILATION AND CURETTAGE OF UTERUS     HYSTEROSCOPY WITH D & C  08/29/2006   and resection of endometrial polyp/notes 09/10/2010   IR IMAGING GUIDED PORT INSERTION  01/18/2021   JOINT REPLACEMENT     KNEE ARTHROSCOPY Bilateral    NASAL SEPTUM SURGERY  1972   OOPHORECTOMY Right 1994   TOTAL KNEE ARTHROPLASTY  08/05/2011    Procedure: TOTAL KNEE ARTHROPLASTY;  Surgeon: Gearlean Alf, MD;  Location: [facility];  Service: Orthopedics;  Laterality: Right;   TOTAL KNEE ARTHROPLASTY Left 05/2009   TUBAL LIGATION  1975   Patient Active Problem List   Diagnosis Date Noted   Port-A-Cath in place 08/27/2021   Macrocytosis without anemia 08/30/2020   Hereditary hemochromatosis ([place]) 06/19/2020   Cough 05/14/2017   ETOH abuse 04/27/2017   Abnormal LFTs 04/26/2017   Thrombocytopenia ([place]) 04/26/2017   Dyspnea on exertion 03/18/2017   Morbid obesity ([place]) 03/18/2017   Obstructive sleep apnea 12/07/2016   Essential hypertension 12/07/2016   Hx of adenomatous polyp of colon 03/21/2014   Postop Hyponatremia 08/07/2011   OA (osteoarthritis) of knee 08/05/2011   Pain in limb 05/15/2011    REFERRING PROVIDER: Melina Schools, MD  REFERRING DIAG: M54.51 (ICD-10-CM) - Vertebrogenic low back pain  Rationale for Evaluation and Treatment Rehabilitation  THERAPY DIAG:  Other low back pain  Difficulty in walking, not elsewhere classified  ONSET DATE: 6th of July was in pain so bad I was nearly in tears  SUBJECTIVE:  SUBJECTIVE STATEMENT: When I am standing my back hurts after about 5 min. The intensity has not seemed to change. Stretches kind of help it. I am now able to do the figure 4 stretch. I would say I am improved about 50% since beginning.    PERTINENT HISTORY: DDD, OA, DOE, Rt TKA, IBS & Diverticulitis  PAIN:  Are you having pain? Yes : NPRS scale: 4/10 Pain location: L shoulder (points to deltoid) Pain description: achy Aggravating factors: lifting arm, certain motions Relieving factors: ?   PRECAUTIONS: None  WEIGHT BEARING RESTRICTIONS No  FALLS:  Has patient fallen in last 6 months? No  OCCUPATION:  retired  PLOF: Independent  PATIENT GOALS decrease pain, I get so out of breath with activity   OBJECTIVE:   PATIENT SURVEYS:  FOTO 47 01/16/22: FOTO 50 10/10: FOTO 57  POSTURE: anterior pelvic tilt  LUMBAR ROM:   Active  A/PROM  eval 9/27 10/10  Flexion WFL    Extension WFL    Right lateral flexion pain pain WFL no p!  Left lateral flexion WFL pain WFL no p!  Right rotation     Left rotation      (Blank rows = not tested)    LOWER EXTREMITY MMT:    MMT Right 8/21 Left 8/21 Right/Left 9/27 Rt // Lt (lb)  Hip flexion      Hip extension      Hip abduction (SL above knee) 4 3+ 4+ - 4 18.9 // 30.9  Hip adduction      Hip internal rotation      Hip external rotation      Knee flexion      Knee extension    52.1 // 41.4  Ankle dorsiflexion      Ankle plantarflexion      Ankle inversion      Ankle eversion       (Blank rows = not tested)   6 min walk test 01/29/22: 1018'  difficulty 5/10  dyspnea 7/10; 1 seated rest break lasting 40s    TODAY'S TREATMENT  10/5 Pt seen for aquatic therapy today.  Treatment took place in water 3.25-4.5 ft in depth at the [facility]. Temp of water was 92.  Pt entered/exited the pool via steps with hand rail indep.  Walking forward, back and side stepping Thin square noodle pull down x 10 At stairs: hamstring stretch, adductor stretch, side stretch with hand on head; fig 4 piriformis stretch  Standing hip hinge using barbell x 10, 3-5 sec hold Holding white barbell:  high knee forward marching; heel raises (narrow / wide base of support x 10 each);  Wide stance with kick board push/pull  Standing lumbar rotation with hands on kick board on surface; kick board push downs with ab set High knee marching forward with reciprocal arm punch under surface of water (began to bother L shoulder) Holding yellow hand buoys at side - forward/ backward gait Side step squat with arm add Wall push up/off  Pt requires the  buoyancy and hydrostatic pressure of water for support, and to offload joints by unweighting joint load by at least 50 % in navel deep water and by at least 75-80% in chest to neck deep water.  Viscosity of the water is needed for resistance of strengthening. Water current perturbations provides challenge to standing balance requiring increased core activation.  10/3 Pt seen for aquatic therapy today.  Treatment took place in water 3.25-4.5 ft in depth at the  [facility] pool. Temp of water was 92.  Pt entered/exited the pool via steps with hand rail indep.  Walking forward, back and side stepping Standing hip hinge using barbell x 10 Holding white barbell:  high knee forward marching; heel raises (narrow / wide base of support x 10 each); hip openers Standing lumbar rotation with hands on kick board on surface Staggered stance with kick board push pull  Thin square noodle pull down x 10 Attempted back against wall to get noodle to support foot for Hamstring stretch- couldn't reach LE At stairs: hamstring stretch, adductor stretch, side stretch.  9/6  Trigger Point Dry Needling, Manual Therapy Treatment:  Initial or subsequent education regarding Trigger Point Dry Needling: Subsequent Did patient give consent to treatment with Trigger Point Dry Needling: Yes TPDN with skilled palpation and monitoring followed by STM to the following muscles: Lt upper trap  Lt first rib depression mobilization  Seated hamstring stretch  Supine figure 4 with small range LTR Hooklying bridge x15 Supine SLR with cues for core engagement x10 each Supine leg circles with cues for core control to keep level pelvis x10 each Dead bug x12   PATIENT EDUCATION:  Education details: aquatics progressions Person educated: Patient Education method: Consulting civil engineer, Media planner, Corporate treasurer cues, Verbal cues, Education comprehension: verbalized understanding, returned demonstration, verbal cues required,  tactile cues required, and needs further education   HOME EXERCISE PROGRAM: [id number]  ASSESSMENT:  CLINICAL IMPRESSION:  Pt has improved in overall flexibility and ROM, she is also improving in her awareness of respiration control. Strength was measurable via HHD today but lacking for age appropriate values and side to side equality in strength. She plans to go to [place] in a year and we spent time discussing a walking program and progressions as well as O2 sat management and rest breaks. Will progress POC to continue challenging lumbopelvic strength and progress walking program to normative values in effort to prepare her for long term conditioning.      OBJECTIVE IMPAIRMENTS decreased activity tolerance, difficulty walking, decreased ROM, decreased strength, increased muscle spasms, impaired flexibility, improper body mechanics, postural dysfunction, obesity, and pain.   ACTIVITY LIMITATIONS carrying, lifting, bending, standing, squatting, sleeping, stairs, bed mobility, and locomotion level  PARTICIPATION LIMITATIONS: meal prep, cleaning, laundry, shopping, and community activity  [place], Time since onset of injury/illness/exacerbation, and 1-2 comorbidities: DOE, DDD  are also affecting patient's functional outcome.   REHAB POTENTIAL: Good  CLINICAL DECISION MAKING: Stable/uncomplicated  EVALUATION COMPLEXITY: Low   GOALS: Goals reviewed with patient? Yes  SHORT TERM GOALS: Target date: 12/14/2021  Decrease in spasm around hip via use of DN/manual therapy Baseline: Goal status:achieved   LONG TERM GOALS: Target date: 02/01/2022  FOTO goal to be met (58) Baseline: 01/16/22 Goal status: ongoing  2.  Able to demo strength via hand held dynamometry for age appropriate norms Baseline: see chart Goal status: addendum 10/10   3.  Able to participate in proper strengthening HEP with understanding of rest breaks and control dyspnea Baseline: I am  breathing better Goal status: achieved  4.  Able to complete ADLs pain <=2/10 Baseline:  Goal status: Achieved - 01/08/22  5.  Pt will demo improvement of 6MWT to age appropriate value Baseline: [place] 191', normative value 1545' Goal status: NEW    PLAN: PT FREQUENCY: 1-2x/week  PT DURATION: 6 weeks  PLANNED INTERVENTIONS: Therapeutic exercises, Therapeutic activity, Neuromuscular re-education, Balance training, Gait training, Patient/Family education, Self Care, Joint mobilization, Stair training, Aquatic Therapy,  Dry Needling, Electrical stimulation, Spinal mobilization, Cryotherapy, Moist heat, Taping, Ionotophoresis 77m/ml Dexamethasone, and Manual therapy.  PLAN FOR NEXT SESSION: core strength, bending/squatting with use of hip hinge- requires cues for breathing, speed walking, pool- SLS endurance for CKC hip strengthening  Floella Ensz C. Vastie Douty PT, DPT 01/29/22 2:44 PM

## 2022-03-04 ENCOUNTER — Inpatient Hospital Stay: Payer: Medicare Other

## 2022-03-11 ENCOUNTER — Inpatient Hospital Stay: Payer: Medicare Other

## 2022-03-11 ENCOUNTER — Inpatient Hospital Stay: Payer: Medicare Other | Attending: Hematology and Oncology

## 2022-03-11 DIAGNOSIS — Z95828 Presence of other vascular implants and grafts: Secondary | ICD-10-CM

## 2022-03-11 DIAGNOSIS — R0602 Shortness of breath: Secondary | ICD-10-CM | POA: Insufficient documentation

## 2022-03-11 DIAGNOSIS — Z79899 Other long term (current) drug therapy: Secondary | ICD-10-CM | POA: Insufficient documentation

## 2022-03-11 DIAGNOSIS — R7303 Prediabetes: Secondary | ICD-10-CM | POA: Diagnosis not present

## 2022-03-11 LAB — CBC WITH DIFFERENTIAL/PLATELET
Abs Immature Granulocytes: 0.05 10*3/uL (ref 0.00–0.07)
Basophils Absolute: 0.1 10*3/uL (ref 0.0–0.1)
Basophils Relative: 2 %
Eosinophils Absolute: 0.2 10*3/uL (ref 0.0–0.5)
Eosinophils Relative: 5 %
HCT: 38.8 % (ref 36.0–46.0)
Hemoglobin: 14 g/dL (ref 12.0–15.0)
Immature Granulocytes: 1 %
Lymphocytes Relative: 38 %
Lymphs Abs: 1.6 10*3/uL (ref 0.7–4.0)
MCH: 35 pg — ABNORMAL HIGH (ref 26.0–34.0)
MCHC: 36.1 g/dL — ABNORMAL HIGH (ref 30.0–36.0)
MCV: 97 fL (ref 80.0–100.0)
Monocytes Absolute: 0.4 10*3/uL (ref 0.1–1.0)
Monocytes Relative: 10 %
Neutro Abs: 1.8 10*3/uL (ref 1.7–7.7)
Neutrophils Relative %: 44 %
Platelets: 77 10*3/uL — ABNORMAL LOW (ref 150–400)
RBC: 4 MIL/uL (ref 3.87–5.11)
RDW: 11.7 % (ref 11.5–15.5)
Smear Review: NORMAL
WBC: 4.1 10*3/uL (ref 4.0–10.5)
nRBC: 0 % (ref 0.0–0.2)

## 2022-03-11 LAB — COMPREHENSIVE METABOLIC PANEL
ALT: 29 U/L (ref 0–44)
AST: 22 U/L (ref 15–41)
Albumin: 3.9 g/dL (ref 3.5–5.0)
Alkaline Phosphatase: 75 U/L (ref 38–126)
Anion gap: 7 (ref 5–15)
BUN: 20 mg/dL (ref 8–23)
CO2: 25 mmol/L (ref 22–32)
Calcium: 8.8 mg/dL — ABNORMAL LOW (ref 8.9–10.3)
Chloride: 105 mmol/L (ref 98–111)
Creatinine, Ser: 0.82 mg/dL (ref 0.44–1.00)
GFR, Estimated: >60 mL/min (ref >60)
Glucose, Bld: 221 mg/dL — ABNORMAL HIGH (ref 70–99)
Potassium: 4.3 mmol/L (ref 3.5–5.1)
Sodium: 137 mmol/L (ref 135–145)
Total Bilirubin: 0.7 mg/dL (ref 0.3–1.2)
Total Protein: 6.8 g/dL (ref 6.5–8.1)

## 2022-03-11 LAB — CBC (CANCER CENTER ONLY)
HCT: 41.6 % (ref 36.0–46.0)
Hemoglobin: 14.5 g/dL (ref 12.0–15.0)
MCH: 34.9 pg — ABNORMAL HIGH (ref 26.0–34.0)
MCHC: 34.9 g/dL (ref 30.0–36.0)
MCV: 100 fL (ref 80.0–100.0)
Platelet Count: 174 10*3/uL (ref 150–400)
RBC: 4.16 MIL/uL (ref 3.87–5.11)
RDW: 11.9 % (ref 11.5–15.5)
WBC Count: 6.5 10*3/uL (ref 4.0–10.5)
nRBC: 0 % (ref 0.0–0.2)

## 2022-03-11 MED ORDER — SODIUM CHLORIDE 0.9% FLUSH
10.0000 mL | Freq: Once | INTRAVENOUS | Status: AC
  Administered 2022-03-11: 10 mL

## 2022-03-11 NOTE — Progress Notes (Signed)
Patient here for therapeutic phlebotomy. Her Hct is 38.8. started her phleb. with 19 gauge port kit. got just shy of 90 mls out and have not been able to get anymore. I've flushed it 10 plus times. repositioned it with no luck. she has declined for me to do it iv and doesn't want to be stuck again. Patient sat for 30 minutes post treatment with no issues. Vital signs stable. Doctor notified.

## 2022-03-13 DIAGNOSIS — E785 Hyperlipidemia, unspecified: Secondary | ICD-10-CM | POA: Diagnosis not present

## 2022-03-13 DIAGNOSIS — Z79899 Other long term (current) drug therapy: Secondary | ICD-10-CM | POA: Diagnosis not present

## 2022-03-13 DIAGNOSIS — R7301 Impaired fasting glucose: Secondary | ICD-10-CM | POA: Diagnosis not present

## 2022-03-13 DIAGNOSIS — E559 Vitamin D deficiency, unspecified: Secondary | ICD-10-CM | POA: Diagnosis not present

## 2022-03-19 ENCOUNTER — Inpatient Hospital Stay: Payer: Medicare Other

## 2022-03-19 LAB — CBC WITH DIFFERENTIAL/PLATELET
Abs Immature Granulocytes: 0.01 10*3/uL (ref 0.00–0.07)
Basophils Absolute: 0.1 10*3/uL (ref 0.0–0.1)
Basophils Relative: 1 %
Eosinophils Absolute: 0.2 10*3/uL (ref 0.0–0.5)
Eosinophils Relative: 4 %
HCT: 40.9 % (ref 36.0–46.0)
Hemoglobin: 14.3 g/dL (ref 12.0–15.0)
Immature Granulocytes: 0 %
Lymphocytes Relative: 35 %
Lymphs Abs: 1.7 10*3/uL (ref 0.7–4.0)
MCH: 35.1 pg — ABNORMAL HIGH (ref 26.0–34.0)
MCHC: 35 g/dL (ref 30.0–36.0)
MCV: 100.5 fL — ABNORMAL HIGH (ref 80.0–100.0)
Monocytes Absolute: 0.7 10*3/uL (ref 0.1–1.0)
Monocytes Relative: 14 %
Neutro Abs: 2.3 10*3/uL (ref 1.7–7.7)
Neutrophils Relative %: 46 %
Platelets: 158 10*3/uL (ref 150–400)
RBC: 4.07 MIL/uL (ref 3.87–5.11)
RDW: 11.9 % (ref 11.5–15.5)
WBC: 5 10*3/uL (ref 4.0–10.5)
nRBC: 0 % (ref 0.0–0.2)

## 2022-03-19 LAB — COMPREHENSIVE METABOLIC PANEL
ALT: 31 U/L (ref 0–44)
AST: 24 U/L (ref 15–41)
Albumin: 4.1 g/dL (ref 3.5–5.0)
Alkaline Phosphatase: 81 U/L (ref 38–126)
Anion gap: 6 (ref 5–15)
BUN: 24 mg/dL — ABNORMAL HIGH (ref 8–23)
CO2: 28 mmol/L (ref 22–32)
Calcium: 9.3 mg/dL (ref 8.9–10.3)
Chloride: 105 mmol/L (ref 98–111)
Creatinine, Ser: 1.08 mg/dL — ABNORMAL HIGH (ref 0.44–1.00)
GFR, Estimated: 54 mL/min — ABNORMAL LOW (ref >60)
Glucose, Bld: 127 mg/dL — ABNORMAL HIGH (ref 70–99)
Potassium: 4.3 mmol/L (ref 3.5–5.1)
Sodium: 139 mmol/L (ref 135–145)
Total Bilirubin: 0.7 mg/dL (ref 0.3–1.2)
Total Protein: 7.4 g/dL (ref 6.5–8.1)

## 2022-03-20 DIAGNOSIS — Z79899 Other long term (current) drug therapy: Secondary | ICD-10-CM | POA: Diagnosis not present

## 2022-03-20 DIAGNOSIS — E785 Hyperlipidemia, unspecified: Secondary | ICD-10-CM | POA: Diagnosis not present

## 2022-03-20 DIAGNOSIS — E559 Vitamin D deficiency, unspecified: Secondary | ICD-10-CM | POA: Diagnosis not present

## 2022-03-20 DIAGNOSIS — Z0000 Encounter for general adult medical examination without abnormal findings: Secondary | ICD-10-CM | POA: Diagnosis not present

## 2022-03-21 ENCOUNTER — Inpatient Hospital Stay (HOSPITAL_BASED_OUTPATIENT_CLINIC_OR_DEPARTMENT_OTHER): Payer: Medicare Other | Admitting: Hematology and Oncology

## 2022-03-21 ENCOUNTER — Encounter: Payer: Self-pay | Admitting: Hematology and Oncology

## 2022-03-21 DIAGNOSIS — R7303 Prediabetes: Secondary | ICD-10-CM | POA: Diagnosis not present

## 2022-03-21 DIAGNOSIS — R0602 Shortness of breath: Secondary | ICD-10-CM | POA: Diagnosis not present

## 2022-03-21 DIAGNOSIS — Z79899 Other long term (current) drug therapy: Secondary | ICD-10-CM | POA: Diagnosis not present

## 2022-03-21 NOTE — Progress Notes (Signed)
[facility]   [phone] [phone]   Clinic Follow up Note   Patient Care Team: Jonathon Jordan, MD as PCP - General (Family Medicine) Nahser, Wonda Cheng, MD as PCP - Cardiology (Cardiology) Gaynelle Arabian, MD (Orthopedic Surgery)  CHIEF COMPLAINT: Follow up hereditary hemochromatosis   CURRENT THERAPY: therapeutic phlebotomy if HCT >36 and to maintain ferritin < 500, recently done q2 weeks starting 01/09/21  INTERVAL HISTORY: Michelle [name] returns for follow up and phlebotomy as scheduled.  Michelle [name] is here for follow-up by herself.   She continues to have some SOB while on albuterol. She was also diagnosed with pre diabetes. She was hoping to get monjauro for weight loss. She continues on phlebotomy, last phlebotomy in oct She is seeing the porphyria doc in Jan  No change in breathing/bowel habits/urinary habits Rest of the pertinent 10 point ROS reviewed and negative.  MEDICAL HISTORY:  Past Medical History:  Diagnosis Date   Acute hypoxemic respiratory failure ([place]) 04/24/2017   Anxiety    Arthritis    "back" (04/24/2017)   Atherosclerosis of coronary artery    Blurry vision, left eye    "YAG on the left; to be repaired" (04/24/2017)   Chondromalacia of knee    right knee   Chronic bronchitis ([facility])    Chronic lower back pain    DDD (degenerative disc disease) 07/24/2011   Osteoarthritis-s/p LTKA 7'06, now right planned   Depression 07/24/2011   tx. depression only   DOE (dyspnea on exertion)    Fatty liver    History of diverticulitis 02/2018   Hx of adenomatous polyp of colon 03/21/2014   Hyperlipidemia    Hypertension    IBS (irritable bowel syndrome)    OSA on CPAP 12/07/2016   Pneumonia 1990s X 1   SOB (shortness of breath)    Vitamin D deficiency     SURGICAL HISTORY: Past Surgical History:  Procedure Laterality Date   APPENDECTOMY  1967   BOTOX INJECTION     "face"   CATARACT EXTRACTION W/ INTRAOCULAR LENS IMPLANT  Bilateral 2000s   CESAREAN SECTION  1972; 1975   COLONOSCOPY W/ BIOPSIES AND POLYPECTOMY  "a few"   DILATION AND CURETTAGE OF UTERUS     HYSTEROSCOPY WITH D & C  08/29/2006   and resection of endometrial polyp/notes 09/10/2010   IR IMAGING GUIDED PORT INSERTION  01/18/2021   JOINT REPLACEMENT     KNEE ARTHROSCOPY Bilateral    NASAL SEPTUM SURGERY  1972   OOPHORECTOMY Right 1994   TOTAL KNEE ARTHROPLASTY  08/05/2011   Procedure: TOTAL KNEE ARTHROPLASTY;  Surgeon: Gearlean Alf, MD;  Location: [facility];  Service: Orthopedics;  Laterality: Right;   TOTAL KNEE ARTHROPLASTY Left 05/2009   TUBAL LIGATION  1975    I have reviewed the social history and family history with the patient and they are unchanged from previous note.  ALLERGIES:  has No Known Allergies.  MEDICATIONS:  Current Outpatient Medications  Medication Sig Dispense Refill   albuterol (VENTOLIN HFA) 108 (90 Base) MCG/ACT inhaler Inhale 2 puffs into the lungs every 6 (six) hours as needed for wheezing or shortness of breath. 8 g 5   ALPRAZolam (NIRAVAM) 0.25 MG dissolvable tablet Take 0.25 mg by mouth at bedtime as needed for anxiety.     losartan (COZAAR) 25 MG tablet Take 25 mg by mouth daily.     nitroGLYCERIN (NITROSTAT) 0.4 MG SL tablet Dissolve 1 tablet under the tongue every 5  minutes as needed for chest pain. Max of 3 doses, if no relief, call 911. 25 tablet 3   sertraline (ZOLOFT) 100 MG tablet Take 100 mg by mouth 2 (two) times a day.   0   No current facility-administered medications for this visit.   Facility-Administered Medications Ordered in Other Visits  Medication Dose Route Frequency Provider Last Rate Last Admin   sodium chloride flush (NS) 0.9 % injection 10 mL  10 mL Intravenous PRN Jozelyn Kuwahara, MD   10 mL at 12/31/21 1536    PHYSICAL EXAMINATION:  Vitals:   03/21/22 1604  BP: (!) 141/77  Pulse: 86  Resp: 18  Temp: 97.7 F (36.5 C)  SpO2: 93%   O2 sat 94-96% on room air on ambulation,  pulse did not exceed 100 bpm Filed Weights   03/21/22 1604  Weight: 237 lb (107.5 kg)    Physical Exam Constitutional:      Appearance: Normal appearance.  Cardiovascular:     Rate and Rhythm: Normal rate and regular rhythm.     Pulses: Normal pulses.     Heart sounds: Normal heart sounds.  Pulmonary:     Effort: Pulmonary effort is normal.     Breath sounds: Normal breath sounds.  Abdominal:     General: Abdomen is flat. There is no distension.     Palpations: Abdomen is soft. There is no mass.     Tenderness: There is no abdominal tenderness.  Musculoskeletal:     Cervical back: Normal range of motion and neck supple. No rigidity.  Lymphadenopathy:     Cervical: No cervical adenopathy.  Skin:    Findings: Rash (Rash has significantly improved) present.  Neurological:     General: No focal deficit present.     Mental Status: She is alert.      LABORATORY DATA:  I have reviewed the data as listed    Latest Ref Rng & Units 03/19/2022    3:18 PM 03/11/2022    3:29 PM 03/11/2022    1:35 PM  CBC  WBC 4.0 - 10.5 K/uL 5.0  6.5  4.1   Hemoglobin 12.0 - 15.0 g/dL 14.3  14.5  14.0   Hematocrit 36.0 - 46.0 % 40.9  41.6  38.8   Platelets 150 - 400 K/uL 158  174  77         Latest Ref Rng & Units 03/19/2022    3:18 PM 03/11/2022    1:35 PM 01/28/2022    3:36 PM  CMP  Glucose 70 - 99 mg/dL 127  221  180   BUN 8 - 23 mg/dL '24  20  23   '$ Creatinine 0.44 - 1.00 mg/dL 1.08  0.82  0.94   Sodium 135 - 145 mmol/L 139  137  139   Potassium 3.5 - 5.1 mmol/L 4.3  4.3  3.9   Chloride 98 - 111 mmol/L 105  105  107   CO2 22 - 32 mmol/L '28  25  25   '$ Calcium 8.9 - 10.3 mg/dL 9.3  8.8  8.6   Total Protein 6.5 - 8.1 g/dL 7.4  6.8  6.9   Total Bilirubin 0.3 - 1.2 mg/dL 0.7  0.7  0.6   Alkaline Phos 38 - 126 U/L 81  75  83   AST 15 - 41 U/L '24  22  21   '$ ALT 0 - 44 U/L '31  29  25       '$ RADIOGRAPHIC STUDIES: I have  personally reviewed the radiological images as listed and agreed  with the findings in the report. No results found.   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 Skin rash, she will continue to follow-up with dermatology periodically.  This has significantly improved. Age-appropriate cancer screening discussed. Labs next month, proceed with phlebotomy if ferritin is greater than 50  Return to clinic with me in 4 months  Benay Pike, MD 03/21/22

## 2022-03-23 ENCOUNTER — Encounter: Payer: Self-pay | Admitting: Hematology and Oncology

## 2022-03-24 ENCOUNTER — Encounter: Payer: Self-pay | Admitting: Cardiovascular Disease

## 2022-03-24 NOTE — Progress Notes (Unsigned)
Cardiology Office Note:    Date:  03/25/2022   ID:  Michelle [name], DOB 05/30/1945, MRN [id number]  PCP:  Michelle Jordan, MD   [facility] Providers Cardiologist:  Mertie Moores, MD {    Referring MD: Michelle Jordan, MD   Chief Complaint  Patient presents with   Chest Pain     History of Present Illness:    Michelle [name] is a 76 y.o. female with a hx of chest pain, obstructive sleep apnea, hyperlipidemia, hypertension.  Has hemochromatosis Was diagnosed with HC last year   Has seen Dr. Saunders Revel in the past and sees Dr. Radford Pax for OSA   Has had severe dyspnea and DOE for the past month CT angio of the chest was negative for pulmonary embolus.  She did have some aortic atherosclerosis and some coronary artery calcifications.  She has had several echocardiograms.  Her last echocardiogram from November, 2022 reveals normal left ventricular systolic function.  She has grade 1 diastolic dysfunction.  No leg swelling   Is having difficulty losing weight .  Does still eat processed foods   Bagel for breakfast  Supper - goes out or pork chop, lamb chop, pasta   Has hemachromatosis Was getting phlebotomized monthly for the past year.    Dec. 4, 2023 Dot is seen for follow up of her DOE,,  mild coronary calcifications, Hemachromatosis. [place]  Cor CTA revealed CAC of 113 She has mild nonobstructive CAD by cor CTA  Cardiac MRI ( for hx of hemachromatosis) reveal no evicence of iron accumulation in the heart  Normal LV size and function  Echo in Nov. 2022 - normal LV function  Grade I DD .  Body mass index is 46.09 kg/m.  We discussed the issue of her weight  Is going to try a weight loss medication     Past Medical History:  Diagnosis Date   Acute hypoxemic respiratory failure ([place]) 04/24/2017   Anxiety    Arthritis    "back" (04/24/2017)   Atherosclerosis of coronary artery    Blurry vision, left eye    "YAG on the left; to be repaired" (04/24/2017)    Chondromalacia of knee    right knee   Chronic bronchitis (HCC)    Chronic lower back pain    DDD (degenerative disc disease) 07/24/2011   Osteoarthritis-s/p LTKA 2'11, now right planned   Depression 07/24/2011   tx. depression only   DOE (dyspnea on exertion)    Fatty liver    History of diverticulitis 02/2018   Hx of adenomatous polyp of colon 03/21/2014   Hyperlipidemia    Hypertension    IBS (irritable bowel syndrome)    OSA on CPAP 12/07/2016   Pneumonia 1990s X 1   SOB (shortness of breath)    Vitamin D deficiency     Past Surgical History:  Procedure Laterality Date   APPENDECTOMY  1967   BOTOX INJECTION     "face"   CATARACT EXTRACTION W/ INTRAOCULAR LENS IMPLANT Bilateral 2000s   CESAREAN SECTION  1972; 1975   COLONOSCOPY W/ BIOPSIES AND POLYPECTOMY  "a few"   DILATION AND CURETTAGE OF UTERUS     HYSTEROSCOPY WITH D & C  08/29/2006   and resection of endometrial polyp/notes 09/10/2010   IR IMAGING GUIDED PORT INSERTION  01/18/2021   JOINT REPLACEMENT     KNEE ARTHROSCOPY Bilateral    NASAL SEPTUM SURGERY  1972   OOPHORECTOMY Right 1994   TOTAL KNEE ARTHROPLASTY  08/05/2011  Procedure: TOTAL KNEE ARTHROPLASTY;  Surgeon: Gearlean Alf, MD;  Location: [facility];  Service: Orthopedics;  Laterality: Right;   TOTAL KNEE ARTHROPLASTY Left 05/2009   TUBAL LIGATION  1975    Current Medications: Current Meds  Medication Sig   albuterol (VENTOLIN HFA) 108 (90 Base) MCG/ACT inhaler Inhale 2 puffs into the lungs every 6 (six) hours as needed for wheezing or shortness of breath.   ALPRAZolam (NIRAVAM) 0.25 MG dissolvable tablet Take 0.25 mg by mouth at bedtime as needed for anxiety.   losartan (COZAAR) 25 MG tablet Take 25 mg by mouth daily.   nitroGLYCERIN (NITROSTAT) 0.4 MG SL tablet Dissolve 1 tablet under the tongue every 5 minutes as needed for chest pain. Max of 3 doses, if no relief, call 911.   sertraline (ZOLOFT) 100 MG tablet Take 100 mg by mouth 2 (two) times a  day.      Allergies:   Patient has no known allergies.   Social History   Socioeconomic History   Marital status: Divorced    Spouse name: Not on file   Number of children: 2   Years of education: Not on file   Highest education level: Not on file  Occupational History   Occupation: Retired Secretary/administrator  Tobacco Use   Smoking status: Former    Packs/day: 0.12    Years: 36.00    Total pack years: 4.32    Types: Cigarettes    Quit date: 06/19/2014    Years since quitting: 7.7   Smokeless tobacco: Never  Vaping Use   Vaping Use: Never used  Substance and Sexual Activity   Alcohol use: Yes    Alcohol/week: 21.0 standard drinks of alcohol    Types: 21 Glasses of wine per week    Comment: 04/24/2017 "3, glasses of white wine qd"   Drug use: No   Sexual activity: Never  Other Topics Concern   Not on file  Social History Narrative   Not on file   Social Determinants of Health   Financial Resource Strain: Not on file  Food Insecurity: Not on file  Transportation Needs: Not on file  Physical Activity: Not on file  Stress: Not on file  Social Connections: Not on file     Family History: The patient's family history includes Heart disease in her father and mother; Hypertension in her sister; Pulmonary embolism in her mother; Stroke in her father. There is no history of Colon cancer, Esophageal cancer, Rectal cancer, Stomach cancer, or Pancreatic cancer.  ROS:   Please see the history of present illness.     All other systems reviewed and are negative.  EKGs/Labs/Other Studies Reviewed:    The following studies were reviewed today:   EKG:      Recent Labs: 08/29/2021: B Natriuretic Peptide 13.6 03/19/2022: ALT 31; BUN 24; Creatinine, Ser 1.08; Hemoglobin 14.3; Platelets 158; Potassium 4.3; Sodium 139  Recent Lipid Panel    Component Value Date/Time   CHOL 254 (H) 02/18/2018 1217   TRIG 153 (H) 02/18/2018 1217   HDL 101 02/18/2018 1217   LDLCALC 122 (H) 02/18/2018  1217     Risk Assessment/Calculations:           Physical Exam:    Physical Exam: Blood pressure 124/80, pulse 76, height 5' (1.524 m), weight 236 lb (107 kg), SpO2 96 %.      Body mass index is 46.09 kg/m.  GEN:  morbidly obese female in no acute distress HEENT: Normal NECK:  No JVD; No carotid bruits LYMPHATICS: No lymphadenopathy CARDIAC: RRR , no murmurs, rubs, gallops RESPIRATORY:  Clear to auscultation without rales, wheezing or rhonchi  ABDOMEN: Soft, non-tender, non-distended MUSCULOSKELETAL:  No edema; No deformity  SKIN: Warm and dry NEUROLOGIC:  Alert and oriented x 3    ASSESSMENT:    1. Essential hypertension   2. Dyspnea on exertion   3. Morbid obesity ([place])   4. Hereditary hemochromatosis ([place])     PLAN:      DOE :     .Has significant shortness of breath.  She has normal left ventricular systolic function.  She has grade 1 diastolic dysfunction.  Blood pressure is well-controlled.  She does not eat salt to excess.  I have asked her to reduce her salt intake.  I think her main issue is her morbid obesity.  Her BMI is 46.  She has difficult time losing weight.  She is working with her primary medical doctor for weight loss solution.  Cardiac MRI did not show any evidence of hemochromatosis.  Coronary CT angiogram reveals nonobstructive coronary artery disease.       2.  [place]:            Medication Adjustments/Labs and Tests Ordered: Current medicines are reviewed at length with the patient today.  Concerns regarding medicines are outlined above.  No orders of the defined types were placed in this encounter.  No orders of the defined types were placed in this encounter.     Patient Instructions  Medication Instructions:  Your physician recommends that you continue on your current medications as directed. Please refer to the Current Medication list given to you today.  *If you need a refill on your cardiac medications before your  next appointment, please call your pharmacy*   Lab Work: NONE If you have labs (blood work) drawn today and your tests are completely normal, you will receive your results only by: [place] (if you have MyChart) OR A paper copy in the mail If you have any lab test that is abnormal or we need to change your treatment, we will call you to review the results.   Testing/Procedures: NONE   Follow-Up: At [facility], you and your health needs are our priority.  As part of our continuing mission to provide you with exceptional heart care, we have created designated Provider Care Teams.  These Care Teams include your primary Cardiologist (physician) and Advanced Practice Providers (APPs -  Physician Assistants and Nurse Practitioners) who all work together to provide you with the care you need, when you need it.  We recommend signing up for the patient portal called "MyChart".  Sign up information is provided on this After Visit Summary.  MyChart is used to connect with patients for Virtual Visits (Telemedicine).  Patients are able to view lab/test results, encounter notes, upcoming appointments, etc.  Non-urgent messages can be sent to your provider as well.   To learn more about what you can do with MyChart, go to [email or web address].    Your next appointment:   As Needed  The format for your next appointment:   In Person  Provider:   Mertie Moores, MD       Important Information About Sugar         Signed, Mertie Moores, MD  03/25/2022 5:18 PM    [place]

## 2022-03-25 ENCOUNTER — Ambulatory Visit: Payer: Medicare Other | Attending: Orthopedic Surgery | Admitting: Cardiovascular Disease

## 2022-03-25 ENCOUNTER — Encounter: Payer: Self-pay | Admitting: Cardiovascular Disease

## 2022-03-25 VITALS — BP 124/80 | HR 76 | Ht 60.0 in | Wt 236.0 lb

## 2022-03-25 DIAGNOSIS — R0609 Other forms of dyspnea: Secondary | ICD-10-CM | POA: Diagnosis not present

## 2022-03-25 DIAGNOSIS — R0600 Dyspnea, unspecified: Secondary | ICD-10-CM | POA: Insufficient documentation

## 2022-03-25 DIAGNOSIS — I10 Essential (primary) hypertension: Secondary | ICD-10-CM | POA: Insufficient documentation

## 2022-03-25 NOTE — Patient Instructions (Signed)
Medication Instructions:  Your physician recommends that you continue on your current medications as directed. Please refer to the Current Medication list given to you today.  *If you need a refill on your cardiac medications before your next appointment, please call your pharmacy*   Lab Work: NONE If you have labs (blood work) drawn today and your tests are completely normal, you will receive your results only by: [place] (if you have MyChart) OR A paper copy in the mail If you have any lab test that is abnormal or we need to change your treatment, we will call you to review the results.   Testing/Procedures: NONE   Follow-Up: At [facility], you and your health needs are our priority.  As part of our continuing mission to provide you with exceptional heart care, we have created designated Provider Care Teams.  These Care Teams include your primary Cardiologist (physician) and Advanced Practice Providers (APPs -  Physician Assistants and Nurse Practitioners) who all work together to provide you with the care you need, when you need it.  We recommend signing up for the patient portal called "MyChart".  Sign up information is provided on this After Visit Summary.  MyChart is used to connect with patients for Virtual Visits (Telemedicine).  Patients are able to view lab/test results, encounter notes, upcoming appointments, etc.  Non-urgent messages can be sent to your provider as well.   To learn more about what you can do with MyChart, go to [email or web address].    Your next appointment:   As Needed  The format for your next appointment:   In Person  Provider:   Mertie Moores, MD       Important Information About Sugar

## 2022-03-27 DIAGNOSIS — M25512 Pain in left shoulder: Secondary | ICD-10-CM | POA: Diagnosis not present

## 2022-04-08 ENCOUNTER — Inpatient Hospital Stay: Payer: Medicare Other | Attending: Hematology and Oncology

## 2022-04-08 ENCOUNTER — Other Ambulatory Visit (HOSPITAL_COMMUNITY): Payer: Self-pay

## 2022-04-08 LAB — COMPREHENSIVE METABOLIC PANEL
ALT: 45 U/L — ABNORMAL HIGH (ref 0–44)
AST: 26 U/L (ref 15–41)
Albumin: 4.1 g/dL (ref 3.5–5.0)
Alkaline Phosphatase: 70 U/L (ref 38–126)
Anion gap: 7 (ref 5–15)
BUN: 30 mg/dL — ABNORMAL HIGH (ref 8–23)
CO2: 27 mmol/L (ref 22–32)
Calcium: 9.7 mg/dL (ref 8.9–10.3)
Chloride: 105 mmol/L (ref 98–111)
Creatinine, Ser: 1.1 mg/dL — ABNORMAL HIGH (ref 0.44–1.00)
GFR, Estimated: 53 mL/min — ABNORMAL LOW (ref >60)
Glucose, Bld: 111 mg/dL — ABNORMAL HIGH (ref 70–99)
Potassium: 4 mmol/L (ref 3.5–5.1)
Sodium: 139 mmol/L (ref 135–145)
Total Bilirubin: 0.6 mg/dL (ref 0.3–1.2)
Total Protein: 6.8 g/dL (ref 6.5–8.1)

## 2022-04-08 LAB — CBC WITH DIFFERENTIAL/PLATELET
Abs Immature Granulocytes: 0.05 10*3/uL (ref 0.00–0.07)
Basophils Absolute: 0.1 10*3/uL (ref 0.0–0.1)
Basophils Relative: 1 %
Eosinophils Absolute: 0.1 10*3/uL (ref 0.0–0.5)
Eosinophils Relative: 1 %
HCT: 41.6 % (ref 36.0–46.0)
Hemoglobin: 14.7 g/dL (ref 12.0–15.0)
Immature Granulocytes: 1 %
Lymphocytes Relative: 15 %
Lymphs Abs: 1.5 10*3/uL (ref 0.7–4.0)
MCH: 35 pg — ABNORMAL HIGH (ref 26.0–34.0)
MCHC: 35.3 g/dL (ref 30.0–36.0)
MCV: 99 fL (ref 80.0–100.0)
Monocytes Absolute: 0.8 10*3/uL (ref 0.1–1.0)
Monocytes Relative: 8 %
Neutro Abs: 7.9 10*3/uL — ABNORMAL HIGH (ref 1.7–7.7)
Neutrophils Relative %: 74 %
Platelets: 167 10*3/uL (ref 150–400)
RBC: 4.2 MIL/uL (ref 3.87–5.11)
RDW: 12 % (ref 11.5–15.5)
WBC: 10.4 10*3/uL (ref 4.0–10.5)
nRBC: 0 % (ref 0.0–0.2)

## 2022-04-08 LAB — FERRITIN: Ferritin: 136 ng/mL (ref 11–307)

## 2022-04-09 ENCOUNTER — Encounter: Payer: Self-pay | Admitting: Hematology and Oncology

## 2022-04-09 ENCOUNTER — Other Ambulatory Visit (HOSPITAL_COMMUNITY): Payer: Self-pay

## 2022-04-09 ENCOUNTER — Inpatient Hospital Stay: Payer: Medicare Other

## 2022-04-09 DIAGNOSIS — Z95828 Presence of other vascular implants and grafts: Secondary | ICD-10-CM

## 2022-04-09 MED ORDER — SODIUM CHLORIDE 0.9% FLUSH
10.0000 mL | Freq: Once | INTRAVENOUS | Status: AC
  Administered 2022-04-09: 10 mL

## 2022-04-09 MED ORDER — OZEMPIC (0.25 OR 0.5 MG/DOSE) 2 MG/3ML ~~LOC~~ SOPN
0.2500 mg | PEN_INJECTOR | SUBCUTANEOUS | 1 refills | Status: DC
  Filled 2022-04-09: qty 3, 28d supply, fill #0
  Filled 2022-05-02: qty 3, 28d supply, fill #1

## 2022-04-09 MED ORDER — HEPARIN SOD (PORK) LOCK FLUSH 100 UNIT/ML IV SOLN
500.0000 [IU] | Freq: Once | INTRAVENOUS | Status: AC
  Administered 2022-04-09: 500 [IU]

## 2022-04-09 NOTE — Patient Instructions (Signed)

## 2022-04-09 NOTE — Progress Notes (Signed)
Clabe Seal presents today for phlebotomy per MD orders. Phlebotomy procedure started at 1430 and ended at 1525. Patient's PAC was used with a 19 G PAC needle. 500 grams removed. Patient sat up and a little forward throughout the procedure and periodically took deep breaths this helped maintain good blood return. Patient observed for 30 minutes after procedure without any incident. Patient declined food and beverage.  Patient tolerated procedure well. PAC needle removed intact. VSS at discharge Patient ambulatory to lobby at discharge.

## 2022-04-11 ENCOUNTER — Other Ambulatory Visit: Payer: Medicare Other

## 2022-04-12 ENCOUNTER — Ambulatory Visit: Payer: Medicare Other

## 2022-04-12 ENCOUNTER — Other Ambulatory Visit (HOSPITAL_COMMUNITY): Payer: Self-pay

## 2022-04-12 DIAGNOSIS — R399 Unspecified symptoms and signs involving the genitourinary system: Secondary | ICD-10-CM | POA: Diagnosis not present

## 2022-04-17 DIAGNOSIS — H524 Presbyopia: Secondary | ICD-10-CM | POA: Diagnosis not present

## 2022-04-29 DIAGNOSIS — E785 Hyperlipidemia, unspecified: Secondary | ICD-10-CM | POA: Diagnosis not present

## 2022-04-29 DIAGNOSIS — I10 Essential (primary) hypertension: Secondary | ICD-10-CM | POA: Diagnosis not present

## 2022-04-29 DIAGNOSIS — E1169 Type 2 diabetes mellitus with other specified complication: Secondary | ICD-10-CM | POA: Diagnosis not present

## 2022-04-29 DIAGNOSIS — F3341 Major depressive disorder, recurrent, in partial remission: Secondary | ICD-10-CM | POA: Diagnosis not present

## 2022-05-01 DIAGNOSIS — Z6841 Body Mass Index (BMI) 40.0 and over, adult: Secondary | ICD-10-CM | POA: Diagnosis not present

## 2022-05-04 ENCOUNTER — Encounter: Payer: Self-pay | Admitting: Hematology and Oncology

## 2022-05-04 ENCOUNTER — Other Ambulatory Visit: Payer: Self-pay

## 2022-05-07 ENCOUNTER — Other Ambulatory Visit (HOSPITAL_COMMUNITY): Payer: Self-pay

## 2022-05-08 ENCOUNTER — Encounter: Payer: Self-pay | Admitting: Hematology and Oncology

## 2022-05-16 ENCOUNTER — Inpatient Hospital Stay: Payer: Medicare Other | Attending: Hematology and Oncology

## 2022-05-16 LAB — CBC WITH DIFFERENTIAL/PLATELET
Abs Immature Granulocytes: 0.01 10*3/uL (ref 0.00–0.07)
Basophils Absolute: 0.1 10*3/uL (ref 0.0–0.1)
Basophils Relative: 1 %
Eosinophils Absolute: 0.1 10*3/uL (ref 0.0–0.5)
Eosinophils Relative: 3 %
HCT: 39.1 % (ref 36.0–46.0)
Hemoglobin: 14.1 g/dL (ref 12.0–15.0)
Immature Granulocytes: 0 %
Lymphocytes Relative: 28 %
Lymphs Abs: 1.6 10*3/uL (ref 0.7–4.0)
MCH: 35.4 pg — ABNORMAL HIGH (ref 26.0–34.0)
MCHC: 36.1 g/dL — ABNORMAL HIGH (ref 30.0–36.0)
MCV: 98.2 fL (ref 80.0–100.0)
Monocytes Absolute: 0.8 10*3/uL (ref 0.1–1.0)
Monocytes Relative: 14 %
Neutro Abs: 3.1 10*3/uL (ref 1.7–7.7)
Neutrophils Relative %: 54 %
Platelets: 164 10*3/uL (ref 150–400)
RBC: 3.98 MIL/uL (ref 3.87–5.11)
RDW: 12 % (ref 11.5–15.5)
WBC: 5.7 10*3/uL (ref 4.0–10.5)
nRBC: 0 % (ref 0.0–0.2)

## 2022-05-16 LAB — COMPREHENSIVE METABOLIC PANEL
ALT: 32 U/L (ref 0–44)
AST: 25 U/L (ref 15–41)
Albumin: 3.8 g/dL (ref 3.5–5.0)
Alkaline Phosphatase: 66 U/L (ref 38–126)
Anion gap: 7 (ref 5–15)
BUN: 20 mg/dL (ref 8–23)
CO2: 25 mmol/L (ref 22–32)
Calcium: 9.1 mg/dL (ref 8.9–10.3)
Chloride: 104 mmol/L (ref 98–111)
Creatinine, Ser: 1.05 mg/dL — ABNORMAL HIGH (ref 0.44–1.00)
GFR, Estimated: 56 mL/min — ABNORMAL LOW (ref >60)
Glucose, Bld: 104 mg/dL — ABNORMAL HIGH (ref 70–99)
Potassium: 4.2 mmol/L (ref 3.5–5.1)
Sodium: 136 mmol/L (ref 135–145)
Total Bilirubin: 0.9 mg/dL (ref 0.3–1.2)
Total Protein: 6.9 g/dL (ref 6.5–8.1)

## 2022-05-17 ENCOUNTER — Inpatient Hospital Stay: Payer: Medicare Other

## 2022-05-17 DIAGNOSIS — Z95828 Presence of other vascular implants and grafts: Secondary | ICD-10-CM

## 2022-05-17 LAB — FERRITIN: Ferritin: 90 ng/mL (ref 11–307)

## 2022-05-17 NOTE — Patient Instructions (Signed)

## 2022-05-17 NOTE — Progress Notes (Signed)
Michelle [name] presents today for phlebotomy per MD orders. Phlebotomy procedure started at 1530 and ended at 1622. 500 grams removed. PAC accessed with 20 G needle as no 19 G needles were available, only 10 cc's of blood obtained via PAC 18 G needle to R outer arm used to collect remained 490 cc's of blood  Patient observed for 30 minutes after procedure without any incident. Patient tolerated procedure well, food and drink offered.  IV needle removed intact.

## 2022-05-23 ENCOUNTER — Telehealth: Payer: Self-pay | Admitting: Hematology and Oncology

## 2022-05-23 NOTE — Telephone Encounter (Signed)
Called patient about changed appointment, unable to leave message . Will mail out calendar.

## 2022-06-11 ENCOUNTER — Inpatient Hospital Stay: Payer: Medicare Other | Attending: Hematology and Oncology

## 2022-06-11 DIAGNOSIS — L304 Erythema intertrigo: Secondary | ICD-10-CM | POA: Diagnosis not present

## 2022-06-11 DIAGNOSIS — Z87891 Personal history of nicotine dependence: Secondary | ICD-10-CM | POA: Insufficient documentation

## 2022-06-11 DIAGNOSIS — L570 Actinic keratosis: Secondary | ICD-10-CM | POA: Diagnosis not present

## 2022-06-11 DIAGNOSIS — L940 Localized scleroderma [morphea]: Secondary | ICD-10-CM | POA: Diagnosis not present

## 2022-06-11 DIAGNOSIS — L218 Other seborrheic dermatitis: Secondary | ICD-10-CM | POA: Diagnosis not present

## 2022-06-11 LAB — CBC WITH DIFFERENTIAL/PLATELET
Abs Immature Granulocytes: 0.02 10*3/uL (ref 0.00–0.07)
Basophils Absolute: 0.1 10*3/uL (ref 0.0–0.1)
Basophils Relative: 1 %
Eosinophils Absolute: 0.2 10*3/uL (ref 0.0–0.5)
Eosinophils Relative: 3 %
HCT: 39.7 % (ref 36.0–46.0)
Hemoglobin: 14.1 g/dL (ref 12.0–15.0)
Immature Granulocytes: 0 %
Lymphocytes Relative: 27 %
Lymphs Abs: 1.5 10*3/uL (ref 0.7–4.0)
MCH: 35.3 pg — ABNORMAL HIGH (ref 26.0–34.0)
MCHC: 35.5 g/dL (ref 30.0–36.0)
MCV: 99.3 fL (ref 80.0–100.0)
Monocytes Absolute: 0.7 10*3/uL (ref 0.1–1.0)
Monocytes Relative: 12 %
Neutro Abs: 3.2 10*3/uL (ref 1.7–7.7)
Neutrophils Relative %: 57 %
Platelets: 181 10*3/uL (ref 150–400)
RBC: 4 MIL/uL (ref 3.87–5.11)
RDW: 11.9 % (ref 11.5–15.5)
WBC: 5.7 10*3/uL (ref 4.0–10.5)
nRBC: 0 % (ref 0.0–0.2)

## 2022-06-11 LAB — COMPREHENSIVE METABOLIC PANEL
ALT: 37 U/L (ref 0–44)
AST: 31 U/L (ref 15–41)
Albumin: 4 g/dL (ref 3.5–5.0)
Alkaline Phosphatase: 62 U/L (ref 38–126)
Anion gap: 6 (ref 5–15)
BUN: 18 mg/dL (ref 8–23)
CO2: 27 mmol/L (ref 22–32)
Calcium: 9 mg/dL (ref 8.9–10.3)
Chloride: 107 mmol/L (ref 98–111)
Creatinine, Ser: 0.99 mg/dL (ref 0.44–1.00)
GFR, Estimated: 59 mL/min — ABNORMAL LOW (ref >60)
Glucose, Bld: 118 mg/dL — ABNORMAL HIGH (ref 70–99)
Potassium: 4.2 mmol/L (ref 3.5–5.1)
Sodium: 140 mmol/L (ref 135–145)
Total Bilirubin: 0.8 mg/dL (ref 0.3–1.2)
Total Protein: 6.8 g/dL (ref 6.5–8.1)

## 2022-06-11 LAB — FERRITIN: Ferritin: 66 ng/mL (ref 11–307)

## 2022-06-12 ENCOUNTER — Inpatient Hospital Stay: Payer: Medicare Other

## 2022-06-12 ENCOUNTER — Telehealth: Payer: Self-pay

## 2022-06-12 ENCOUNTER — Telehealth: Payer: Self-pay | Admitting: Hematology and Oncology

## 2022-06-12 ENCOUNTER — Inpatient Hospital Stay: Payer: Medicare Other | Admitting: Hematology and Oncology

## 2022-06-12 NOTE — Telephone Encounter (Signed)
Called pt as she was [place]/NS for therapeutic phlebotomy and MD visit. Pt states she was to come in 2/22 for MD visit and phleb and was not made aware of any schedule changes.  Message sent to scheduler to call pt to r/s.

## 2022-06-12 NOTE — Progress Notes (Incomplete)
[place]   [phone] [phone]   Clinic Follow up Note   Patient Care Team: Jonathon Jordan, MD as PCP - General (Family Medicine) Nahser, Wonda Cheng, MD as PCP - Cardiology (Cardiology) Gaynelle Arabian, MD (Orthopedic Surgery)  CHIEF COMPLAINT: Follow up hereditary hemochromatosis   CURRENT THERAPY: therapeutic phlebotomy if HCT >36 and to maintain ferritin < 50, last infusion on 05/17/2022  INTERVAL HISTORY:   Ms. Michelle [name] returns for follow up She continues to have some SOB while on albuterol. She was also diagnosed with pre diabetes. She was hoping to get monjauro for weight loss. She continues on phlebotomy, last phlebotomy in oct She is seeing the porphyria doc in Jan  No change in breathing/bowel habits/urinary habits Rest of the pertinent 10 point ROS reviewed and negative.  MEDICAL HISTORY:  Past Medical History:  Diagnosis Date  . Acute hypoxemic respiratory failure ([place]) 04/24/2017  . Anxiety   . Arthritis    "back" (04/24/2017)  . Atherosclerosis of coronary artery   . 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 Chondromalacia of knee    right knee  . Chronic bronchitis ([place])   . Chronic lower back pain   . DDD (degenerative disc disease) 07/24/2011   Osteoarthritis-s/p LTKA [id number], now right planned  . Depression 07/24/2011   tx. depression only  . DOE (dyspnea on exertion)   . Fatty liver   . History of diverticulitis 02/2018  . Hx of adenomatous polyp of colon 03/21/2014  . Hyperlipidemia   . Hypertension   . IBS (irritable bowel syndrome)   . OSA on CPAP 12/07/2016  . Pneumonia 1990s X 1  . SOB (shortness of breath)   . Vitamin D deficiency     SURGICAL HISTORY: Past Surgical History:  Procedure Laterality Date  . APPENDECTOMY  1967  . BOTOX INJECTION     "face"  . CATARACT EXTRACTION W/ INTRAOCULAR LENS IMPLANT Bilateral 2000s  . [place]; 1975  . COLONOSCOPY W/  BIOPSIES AND POLYPECTOMY  "a few"  . DILATION AND CURETTAGE OF UTERUS    . HYSTEROSCOPY WITH D & C  08/29/2006   and resection of endometrial polyp/notes 09/10/2010  . IR IMAGING GUIDED PORT INSERTION  01/18/2021  . JOINT REPLACEMENT    . KNEE ARTHROSCOPY Bilateral   . NASAL SEPTUM SURGERY  1972  . OOPHORECTOMY Right 1994  . TOTAL KNEE ARTHROPLASTY  08/05/2011   Procedure: TOTAL KNEE ARTHROPLASTY;  Surgeon: Gearlean Alf, MD;  Location: [facility];  Service: Orthopedics;  Laterality: Right;  . TOTAL KNEE ARTHROPLASTY Left 05/2009  . TUBAL LIGATION  1975    I have reviewed the social history and family history with the patient and they are unchanged from previous note.  ALLERGIES:  has No Known Allergies.  MEDICATIONS:  Current Outpatient Medications  Medication Sig Dispense Refill  . albuterol (VENTOLIN HFA) 108 (90 Base) MCG/ACT inhaler Inhale 2 puffs into the lungs every 6 (six) hours as needed for wheezing or shortness of breath. 8 g 5  . ALPRAZolam (NIRAVAM) 0.25 MG dissolvable tablet Take 0.25 mg by mouth at bedtime as needed for anxiety.    Marland Kitchen losartan (COZAAR) 25 MG tablet Take 25 mg by mouth daily.    . nitroGLYCERIN (NITROSTAT) 0.4 MG SL tablet Dissolve 1 tablet under the tongue every 5 minutes as needed for chest pain. Max of 3 doses, if no relief, call 911.  25 tablet 3  . Semaglutide,0.25 or 0.5MG/DOS, (OZEMPIC, 0.25 OR 0.5 MG/DOSE,) 2 MG/3ML SOPN Inject 0.25 mg into the skin once a week for 2 weeks then increase to 0.91m weekly 3 mL 1  . sertraline (ZOLOFT) 100 MG tablet Take 100 mg by mouth 2 (two) times a day.   0   No current facility-administered medications for this visit.   Facility-Administered Medications Ordered in Other Visits  Medication Dose Route Frequency Provider Last Rate Last Admin  . sodium chloride flush (NS) 0.9 % injection 10 mL  10 mL Intravenous PRN Ayden Hardwick, MD   10 mL at 12/31/21 1536    PHYSICAL EXAMINATION:  There were no vitals filed for  this visit.  O2 sat 94-96% on room air on ambulation, pulse did not exceed 100 bpm There were no vitals filed for this visit.   Physical Exam Constitutional:      Appearance: Normal appearance.  Cardiovascular:     Rate and Rhythm: Normal rate and regular rhythm.     Pulses: Normal pulses.     Heart sounds: Normal heart sounds.  Pulmonary:     Effort: Pulmonary effort is normal.     Breath sounds: Normal breath sounds.  Abdominal:     General: Abdomen is flat. There is no distension.     Palpations: Abdomen is soft. There is no mass.     Tenderness: There is no abdominal tenderness.  Musculoskeletal:     Cervical back: Normal range of motion and neck supple. No rigidity.  Lymphadenopathy:     Cervical: No cervical adenopathy.  Skin:    Findings: Rash (Rash has significantly improved) present.  Neurological:     General: No focal deficit present.     Mental Status: She is alert.     LABORATORY DATA:  I have reviewed the data as listed    Latest Ref Rng & Units 06/11/2022    2:16 PM 05/16/2022    2:26 PM 04/08/2022    2:36 PM  CBC  WBC 4.0 - 10.5 K/uL 5.7  5.7  10.4   Hemoglobin 12.0 - 15.0 g/dL 14.1  14.1  14.7   Hematocrit 36.0 - 46.0 % 39.7  39.1  41.6   Platelets 150 - 400 K/uL 181  164  167         Latest Ref Rng & Units 06/11/2022    2:16 PM 05/16/2022    2:26 PM 04/08/2022    2:36 PM  CMP  Glucose 70 - 99 mg/dL 118  104  111   BUN 8 - 23 mg/dL 18  20  30   $ Creatinine 0.44 - 1.00 mg/dL 0.99  1.05  1.10   Sodium 135 - 145 mmol/L 140  136  139   Potassium 3.5 - 5.1 mmol/L 4.2  4.2  4.0   Chloride 98 - 111 mmol/L 107  104  105   CO2 22 - 32 mmol/L 27  25  27   $ Calcium 8.9 - 10.3 mg/dL 9.0  9.1  9.7   Total Protein 6.5 - 8.1 g/dL 6.8  6.9  6.8   Total Bilirubin 0.3 - 1.2 mg/dL 0.8  0.9  0.6   Alkaline Phos 38 - 126 U/L 62  66  70   AST 15 - 41 U/L 31  25  26   $ ALT 0 - 44 U/L 37  32  45       RADIOGRAPHIC STUDIES: I have personally reviewed the  radiological  images as listed and agreed with the findings in the report. No results found.   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 Skin rash, she will continue to follow-up with dermatology periodically.  This has significantly improved. Age-appropriate cancer screening discussed. Labs next month, proceed with phlebotomy if ferritin is greater than 50  Return to clinic with me in 4 months  Benay Pike, MD 06/12/22

## 2022-06-12 NOTE — Telephone Encounter (Signed)
Spoke with patient confirming upcoming appointment 

## 2022-06-13 ENCOUNTER — Ambulatory Visit: Payer: Medicare Other | Admitting: Hematology and Oncology

## 2022-06-13 ENCOUNTER — Other Ambulatory Visit: Payer: Medicare Other

## 2022-06-13 ENCOUNTER — Ambulatory Visit: Payer: Medicare Other

## 2022-06-13 DIAGNOSIS — N183 Chronic kidney disease, stage 3 unspecified: Secondary | ICD-10-CM | POA: Diagnosis not present

## 2022-06-13 DIAGNOSIS — F3341 Major depressive disorder, recurrent, in partial remission: Secondary | ICD-10-CM | POA: Diagnosis not present

## 2022-06-13 DIAGNOSIS — F1010 Alcohol abuse, uncomplicated: Secondary | ICD-10-CM | POA: Diagnosis not present

## 2022-06-13 DIAGNOSIS — I700 Atherosclerosis of aorta: Secondary | ICD-10-CM | POA: Diagnosis not present

## 2022-06-13 DIAGNOSIS — E1122 Type 2 diabetes mellitus with diabetic chronic kidney disease: Secondary | ICD-10-CM | POA: Diagnosis not present

## 2022-06-18 DIAGNOSIS — M25512 Pain in left shoulder: Secondary | ICD-10-CM | POA: Diagnosis not present

## 2022-06-19 ENCOUNTER — Other Ambulatory Visit (HOSPITAL_COMMUNITY): Payer: Self-pay

## 2022-06-19 MED ORDER — OZEMPIC (0.25 OR 0.5 MG/DOSE) 2 MG/3ML ~~LOC~~ SOPN
0.5000 mg | PEN_INJECTOR | SUBCUTANEOUS | 4 refills | Status: DC
  Filled 2022-06-19: qty 3, 28d supply, fill #0
  Filled 2022-07-15: qty 3, 28d supply, fill #1
  Filled 2022-09-11: qty 3, 28d supply, fill #2

## 2022-06-20 ENCOUNTER — Inpatient Hospital Stay: Payer: Medicare Other

## 2022-06-20 ENCOUNTER — Inpatient Hospital Stay (HOSPITAL_BASED_OUTPATIENT_CLINIC_OR_DEPARTMENT_OTHER): Payer: Medicare Other | Admitting: Adult Health

## 2022-06-20 DIAGNOSIS — Z87891 Personal history of nicotine dependence: Secondary | ICD-10-CM | POA: Diagnosis not present

## 2022-06-20 NOTE — Progress Notes (Signed)
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 female returns for follow-up of her history of iron overload.  She is here today with questions since seeing the GI specialist at [facility] and being told she does not have hereditary hemochromatosis.  She notes that in her results it says that she had is a carrier for hemochromatosis but does not have the homozygous genetic mutation.  She wants to know if she needs to have phlebotomy today because her ferritin has been doing well and she does not particularly want to have it today.  She denies any new issues such as fever chills cough shortness of breath or any other concerns today.   Patient Active Problem List   Diagnosis Date Noted   Iron overload 06/20/2022   Dyspnea 03/25/2022   Port-A-Cath in place 08/27/2021   Macrocytosis without anemia 08/30/2020   Hereditary hemochromatosis ([place]) 06/19/2020   Cough 05/14/2017   ETOH abuse 04/27/2017   Abnormal LFTs 04/26/2017   Thrombocytopenia ([place]) 04/26/2017   Dyspnea on exertion 03/18/2017   Morbid obesity ([place]) 03/18/2017   Obstructive sleep apnea 12/07/2016   Essential hypertension 12/07/2016   Hx of adenomatous polyp of colon 03/21/2014   Postop Hyponatremia 08/07/2011   OA (osteoarthritis) of knee 08/05/2011   Pain in limb 05/15/2011    has No Known Allergies.  MEDICAL HISTORY: Past Medical History:  Diagnosis Date   Acute hypoxemic respiratory  failure ([place]) 04/24/2017   Anxiety    Arthritis    "back" (04/24/2017)   Atherosclerosis of coronary artery    Blurry vision, left eye    "YAG on the left; to be repaired" (04/24/2017)   Chondromalacia of knee    right knee   Chronic bronchitis ([facility])    Chronic lower back pain    DDD (degenerative disc disease) 07/24/2011   Osteoarthritis-s/p LTKA [id number], now right planned   Depression 07/24/2011   tx. depression only   DOE (dyspnea on exertion)    Fatty liver    History of diverticulitis 02/2018   Hx of adenomatous polyp of colon 03/21/2014   Hyperlipidemia    Hypertension    IBS (irritable bowel syndrome)    OSA on CPAP 12/07/2016   Pneumonia 1990s X 1   SOB (shortness of breath)    Vitamin D deficiency     SURGICAL HISTORY: Past Surgical History:  Procedure Laterality Date   APPENDECTOMY  1967   BOTOX INJECTION     "face"   CATARACT EXTRACTION W/ INTRAOCULAR LENS IMPLANT Bilateral 2000s   CESAREAN SECTION  1972; 1975   COLONOSCOPY W/ BIOPSIES AND POLYPECTOMY  "a few"   DILATION AND CURETTAGE OF UTERUS     HYSTEROSCOPY WITH D & C  08/29/2006   and resection of endometrial polyp/notes 09/10/2010   IR IMAGING GUIDED PORT INSERTION  01/18/2021   JOINT REPLACEMENT     KNEE ARTHROSCOPY Bilateral    NASAL SEPTUM SURGERY  1972   OOPHORECTOMY Right 1994   TOTAL KNEE ARTHROPLASTY  08/05/2011   Procedure:  TOTAL KNEE ARTHROPLASTY;  Surgeon: Gearlean Alf, MD;  Location: [facility];  Service: Orthopedics;  Laterality: Right;   TOTAL KNEE ARTHROPLASTY Left 05/2009   TUBAL LIGATION  1975    SOCIAL HISTORY: Social History   Socioeconomic History   Marital status: Divorced    Spouse name: Not on file   Number of children: 2   Years of education: Not on file   Highest education level: Not on file  Occupational History   Occupation: Retired Secretary/administrator  Tobacco Use   Smoking status: Former    Packs/day: 0.12    Years: 36.00    Total pack years: 4.32    Types: Cigarettes     Quit date: 06/19/2014    Years since quitting: 8.0   Smokeless tobacco: Never  Vaping Use   Vaping Use: Never used  Substance and Sexual Activity   Alcohol use: Yes    Alcohol/week: 21.0 standard drinks of alcohol    Types: 21 Glasses of wine per week    Comment: 04/24/2017 "3, glasses of white wine qd"   Drug use: No   Sexual activity: Never  Other Topics Concern   Not on file  Social History Narrative   Not on file   Social Determinants of Health   Financial Resource Strain: Not on file  Food Insecurity: Not on file  Transportation Needs: Not on file  Physical Activity: Not on file  Stress: Not on file  Social Connections: Not on file  Intimate Partner Violence: Not on file    FAMILY HISTORY: Family History  Problem Relation Age of Onset   Heart disease Mother        Rheumatic heart disease   Pulmonary embolism Mother    Heart disease Father        valvular heart disease   Stroke Father    Hypertension Sister    Colon cancer Neg Hx    Esophageal cancer Neg Hx    Rectal cancer Neg Hx    Stomach cancer Neg Hx    Pancreatic cancer Neg Hx     Review of Systems  Constitutional:  Negative for appetite change, chills, fatigue, fever and unexpected weight change.  HENT:   Negative for hearing loss, lump/mass and trouble swallowing.   Eyes:  Negative for eye problems and icterus.  Respiratory:  Negative for chest tightness, cough and shortness of breath.   Cardiovascular:  Negative for chest pain, leg swelling and palpitations.  Gastrointestinal:  Negative for abdominal distention, abdominal pain, constipation, diarrhea, nausea and vomiting.  Endocrine: Negative for hot flashes.  Genitourinary:  Negative for difficulty urinating.   Musculoskeletal:  Negative for arthralgias.  Skin:  Negative for itching and rash.  Neurological:  Negative for dizziness, extremity weakness, headaches and numbness.  Hematological:  Negative for adenopathy. Does not bruise/bleed easily.   Psychiatric/Behavioral:  Negative for depression. The patient is not nervous/anxious.       PHYSICAL EXAMINATION  ECOG PERFORMANCE STATUS: 0 - Asymptomatic  Vitals:   06/20/22 1417  BP: (!) 142/86  Pulse: 76  Resp: 18  Temp: 99.2 F (37.3 C)  SpO2: 96%    Physical Exam Constitutional:      General: She is not in acute distress.    Appearance: Normal appearance. She is not toxic-appearing.  HENT:     Head: Normocephalic and atraumatic.  Eyes:     General: No scleral icterus. Cardiovascular:     Rate and Rhythm: Normal rate and regular rhythm.  Pulses: Normal pulses.     Heart sounds: Normal heart sounds.  Pulmonary:     Effort: Pulmonary effort is normal.     Breath sounds: Normal breath sounds.  Abdominal:     General: Abdomen is flat. Bowel sounds are normal. There is no distension.     Palpations: Abdomen is soft.     Tenderness: There is no abdominal tenderness.  Musculoskeletal:        General: No swelling.     Cervical back: Neck supple.  Lymphadenopathy:     Cervical: No cervical adenopathy.  Skin:    General: Skin is warm and dry.     Findings: No rash.  Neurological:     General: No focal deficit present.     Mental Status: She is alert.  Psychiatric:        Mood and Affect: Mood normal.        Behavior: Behavior normal.     LABORATORY DATA:  CBC    Component Value Date/Time   WBC 5.7 06/11/2022 1416   RBC 4.00 06/11/2022 1416   HGB 14.1 06/11/2022 1416   HGB 14.5 03/11/2022 1529   HGB 13.7 10/04/2021 1424   HCT 39.7 06/11/2022 1416   HCT 40.4 10/04/2021 1424   PLT 181 06/11/2022 1416   PLT 174 03/11/2022 1529   MCV 99.3 06/11/2022 1416   MCV 99 (H) 02/18/2018 1217   MCH 35.3 (H) 06/11/2022 1416   MCHC 35.5 06/11/2022 1416   RDW 11.9 06/11/2022 1416   RDW 12.1 (L) 02/18/2018 1217   LYMPHSABS 1.5 06/11/2022 1416   LYMPHSABS 1.7 02/18/2018 1217   MONOABS 0.7 06/11/2022 1416   EOSABS 0.2 06/11/2022 1416   EOSABS 0.2 02/18/2018  1217   BASOSABS 0.1 06/11/2022 1416   BASOSABS 0.1 02/18/2018 1217    CMP     Component Value Date/Time   NA 140 06/11/2022 1416   NA 138 10/04/2021 1424   K 4.2 06/11/2022 1416   CL 107 06/11/2022 1416   CO2 27 06/11/2022 1416   GLUCOSE 118 (H) 06/11/2022 1416   BUN 18 06/11/2022 1416   BUN 21 10/04/2021 1424   CREATININE 0.99 06/11/2022 1416   CREATININE 0.98 10/03/2020 1301   CALCIUM 9.0 06/11/2022 1416   PROT 6.8 06/11/2022 1416   PROT 7.0 02/18/2018 1217   ALBUMIN 4.0 06/11/2022 1416   ALBUMIN 4.4 02/18/2018 1217   AST 31 06/11/2022 1416   AST 28 10/03/2020 1301   ALT 37 06/11/2022 1416   ALT 49 (H) 10/03/2020 1301   ALKPHOS 62 06/11/2022 1416   BILITOT 0.8 06/11/2022 1416   BILITOT 0.5 10/03/2020 1301   GFRNONAA 59 (L) 06/11/2022 1416   GFRNONAA >60 10/03/2020 1301   GFRAA >60 08/09/2018 1835        ASSESSMENT and THERAPY PLAN:   Iron overload .Is a 77 year old woman with history of iron overload.  Her ferritin when diagnosed was 1100 today it is 66.  Her hemoglobin has remained stable.  I let her know that she can forego phlebotomy today.  She will return in 3 months for labs and follow-up with Dr. Chryl Heck to discuss her questions about what the testing means now that she understands she does not the genetic mutation for hereditary hemochromatosis.  I reviewed with her that with the phlebotomy her ferritin level has improved greatly which is a very good thing that we got into a good range.  I will defer to Dr. Chryl Heck to discuss the  testing results with her in more detail.   All questions were answered. The patient knows to call the clinic with any problems, questions or concerns. We can certainly see the patient much sooner if necessary.  Total encounter time:20 minutes*in face-to-face visit time, chart review, lab review, care coordination, order entry, and documentation of the encounter time.    Wilber Bihari, NP 06/20/22 3:01 PM Medical Oncology and  Hematology [facility] [place], [place] [address] Tel. [phone]    Fax. [phone]  *Total Encounter Time as defined by the Centers for Medicare and Medicaid Services includes, in addition to the face-to-face time of a patient visit (documented in the note above) non-face-to-face time: obtaining and reviewing outside history, ordering and reviewing medications, tests or procedures, care coordination (communications with other health care professionals or caregivers) and documentation in the medical record.

## 2022-06-20 NOTE — Assessment & Plan Note (Signed)
.  Is a 77 year old woman with history of iron overload.  Her ferritin when diagnosed was 1100 today it is 66.  Her hemoglobin has remained stable.  I let her know that she can forego phlebotomy today.  She will return in 3 months for labs and follow-up with Dr. Chryl Heck to discuss her questions about what the testing means now that she understands she does not the genetic mutation for hereditary hemochromatosis.  I reviewed with her that with the phlebotomy her ferritin level has improved greatly which is a very good thing that we got into a good range.  I will defer to Dr. Chryl Heck to discuss the testing results with her in more detail.

## 2022-06-26 DIAGNOSIS — Z1231 Encounter for screening mammogram for malignant neoplasm of breast: Secondary | ICD-10-CM | POA: Diagnosis not present

## 2022-06-26 DIAGNOSIS — Z124 Encounter for screening for malignant neoplasm of cervix: Secondary | ICD-10-CM | POA: Diagnosis not present

## 2022-06-26 DIAGNOSIS — Z6841 Body Mass Index (BMI) 40.0 and over, adult: Secondary | ICD-10-CM | POA: Diagnosis not present

## 2022-06-26 DIAGNOSIS — Z1151 Encounter for screening for human papillomavirus (HPV): Secondary | ICD-10-CM | POA: Diagnosis not present

## 2022-06-26 DIAGNOSIS — Z01419 Encounter for gynecological examination (general) (routine) without abnormal findings: Secondary | ICD-10-CM | POA: Diagnosis not present

## 2022-07-01 DIAGNOSIS — M25512 Pain in left shoulder: Secondary | ICD-10-CM | POA: Diagnosis not present

## 2022-07-04 ENCOUNTER — Telehealth: Payer: Self-pay | Admitting: Hematology and Oncology

## 2022-07-04 NOTE — Telephone Encounter (Signed)
Spoke to patient confirming upcoming appointments 

## 2022-07-08 DIAGNOSIS — M75112 Incomplete rotator cuff tear or rupture of left shoulder, not specified as traumatic: Secondary | ICD-10-CM | POA: Diagnosis not present

## 2022-07-15 ENCOUNTER — Other Ambulatory Visit (HOSPITAL_COMMUNITY): Payer: Self-pay

## 2022-07-31 ENCOUNTER — Other Ambulatory Visit (HOSPITAL_COMMUNITY): Payer: Self-pay

## 2022-07-31 MED ORDER — OZEMPIC (1 MG/DOSE) 4 MG/3ML ~~LOC~~ SOPN
1.0000 mg | PEN_INJECTOR | SUBCUTANEOUS | 0 refills | Status: DC
  Filled 2022-07-31: qty 3, 28d supply, fill #0

## 2022-08-13 ENCOUNTER — Ambulatory Visit (HOSPITAL_BASED_OUTPATIENT_CLINIC_OR_DEPARTMENT_OTHER): Payer: Medicare Other | Attending: Sports Medicine | Admitting: Physical Therapy

## 2022-08-13 DIAGNOSIS — M25512 Pain in left shoulder: Secondary | ICD-10-CM | POA: Insufficient documentation

## 2022-08-13 DIAGNOSIS — M25612 Stiffness of left shoulder, not elsewhere classified: Secondary | ICD-10-CM

## 2022-08-13 DIAGNOSIS — M6281 Muscle weakness (generalized): Secondary | ICD-10-CM

## 2022-08-13 NOTE — Therapy (Signed)
OUTPATIENT PHYSICAL THERAPY SHOULDER EVALUATION   Patient Name: Michelle [name] MRN: [id number] DOB:1945/12/31, 77 y.o., female Today's Date: 08/14/2022  END OF SESSION:  PT End of Session - 08/13/22 1518     Visit Number 1    Number of Visits 12    Date for PT Re-Evaluation 09/25/22    Authorization Type BCBS MCR    PT Start Time 0230    PT Stop Time 0301    PT Time Calculation (min) 31 min    Activity Tolerance Patient tolerated treatment well    Behavior During Therapy WFL for tasks assessed/performed             Past Medical History:  Diagnosis Date   Acute hypoxemic respiratory failure 04/24/2017   Anxiety    Arthritis    "back" (04/24/2017)   Atherosclerosis of coronary artery    Blurry vision, left eye    "YAG on the left; to be repaired" (04/24/2017)   Chondromalacia of knee    right knee   Chronic bronchitis    Chronic lower back pain    DDD (degenerative disc disease) 07/24/2011   Osteoarthritis-s/p LTKA 4'09, now right planned   Depression 07/24/2011   tx. depression only   DOE (dyspnea on exertion)    Fatty liver    History of diverticulitis 02/2018   Hx of adenomatous polyp of colon 03/21/2014   Hyperlipidemia    Hypertension    IBS (irritable bowel syndrome)    OSA on CPAP 12/07/2016   Pneumonia 1990s X 1   SOB (shortness of breath)    Vitamin D deficiency    Past Surgical History:  Procedure Laterality Date   APPENDECTOMY  1967   BOTOX INJECTION     "face"   CATARACT EXTRACTION W/ INTRAOCULAR LENS IMPLANT Bilateral 2000s   CESAREAN SECTION  1972; 1975   COLONOSCOPY W/ BIOPSIES AND POLYPECTOMY  "a few"   DILATION AND CURETTAGE OF UTERUS     HYSTEROSCOPY WITH D & C  08/29/2006   and resection of endometrial polyp/notes 09/10/2010   IR IMAGING GUIDED PORT INSERTION  01/18/2021   JOINT REPLACEMENT     KNEE ARTHROSCOPY Bilateral    NASAL SEPTUM SURGERY  1972   OOPHORECTOMY Right 1994   TOTAL KNEE ARTHROPLASTY  08/05/2011   Procedure: TOTAL  KNEE ARTHROPLASTY;  Surgeon: Loanne Drilling, MD;  Location: [facility];  Service: Orthopedics;  Laterality: Right;   TOTAL KNEE ARTHROPLASTY Left 05/2009   TUBAL LIGATION  1975   Patient Active Problem List   Diagnosis Date Noted   Iron overload 06/20/2022   Dyspnea 03/25/2022   Port-A-Cath in place 08/27/2021   Macrocytosis without anemia 08/30/2020   Hereditary hemochromatosis 06/19/2020   Cough 05/14/2017   ETOH abuse 04/27/2017   Abnormal LFTs 04/26/2017   Thrombocytopenia 04/26/2017   Dyspnea on exertion 03/18/2017   Morbid obesity 03/18/2017   Obstructive sleep apnea 12/07/2016   Essential hypertension 12/07/2016   Hx of adenomatous polyp of colon 03/21/2014   Postop Hyponatremia 08/07/2011   OA (osteoarthritis) of knee 08/05/2011   Pain in limb 05/15/2011    PCP: [name], MD  REFERRING PROVIDER: [name], Md  REFERRING DIAG: [phone] (ICD-10-CM) - Pain in left shoulder   THERAPY DIAG:  Acute pain of left shoulder  Stiffness of left shoulder, not elsewhere classified  Muscle weakness (generalized)  Rationale for Evaluation and Treatment: Rehabilitation  ONSET DATE: January 2024  SUBJECTIVE:  SUBJECTIVE STATEMENT: Patient is a 77 y/o women with left shoulder pain that started 3 months ago. Patient does not remember a MOI. Patient had a MRI done last month which reports a bicep tear. Patient is right hand dominant but sleeps on her left side. Patient reports that she is having trouble dressing. Patient reports pain and discomfort with over the head and behind the back reaching with left hand.   Hand dominance: Right  PERTINENT HISTORY: DDD, chronic low back pain, shortness of breath  PAIN:  Sometimes it is sharp. Currently it is "aching"  PRECAUTIONS: Back  WEIGHT  BEARING RESTRICTIONS: No  FALLS:  Has patient fallen in last 6 months? No  LIVING ENVIRONMENT:   OCCUPATION: Retired  PLOF: Independent  PATIENT GOALS: Be able to dress without discomfort  NEXT MD VISIT:   OBJECTIVE:   DIAGNOSTIC FINDINGS:    PATIENT SURVEYS:    COGNITION: Overall cognitive status: Within functional limits for tasks assessed     SENSATION: WFL  POSTURE: Rounded shoulders / forward head   UPPER EXTREMITY ROM:   Active ROM Right eval Left eval  Shoulder flexion  110  Shoulder extension    Shoulder abduction  75  Shoulder adduction    Shoulder internal rotation    Shoulder external rotation    Elbow flexion    Elbow extension    Wrist flexion    Wrist extension    Wrist ulnar deviation    Wrist radial deviation    Wrist pronation    Wrist supination    (Blank rows = not tested)  UPPER EXTREMITY MMT:  MMT Right eval Left eval  Shoulder flexion    Shoulder extension    Shoulder abduction    Shoulder adduction    Shoulder internal rotation    Shoulder external rotation    Middle trapezius    Lower trapezius    Elbow flexion    Elbow extension    Wrist flexion    Wrist extension    Wrist ulnar deviation    Wrist radial deviation    Wrist pronation    Wrist supination    Grip strength (lbs)    (Blank rows = not tested) not tested 2nd to irritation   SHOULDER SPECIAL TESTS:   JOINT MOBILITY TESTING:    PALPATION:  TTP anterior left deltoid   TODAY'S TREATMENT:                                                                                                                                         DATE: Access Code: [email or web address] URL: [email or web address] Date: 08/13/2022 Prepared by: Lorayne Bender   Exercises - Seated Scapular Retraction  - 3 x daily - 7 x weekly - 1 sets - 10 reps - Seated Bilateral Shoulder Flexion Towel Slide at Table Top  - 1 x daily - 7 x weekly - 3 sets - 5  reps - 5 sec hold  hold   PATIENT EDUCATION: Education details: POC, HEP, anatomy education, pain management  Person educated: Patient Education method: Explanation, Demonstration, Tactile cues, Verbal cues, and Handouts Education comprehension: verbalized understanding, returned demonstration, verbal cues required, tactile cues required, and needs further education  HOME EXERCISE PROGRAM: Access Code: [email or web address] URL: [email or web address] Date: 08/13/2022 Prepared by: Lorayne Bender   Exercises - Seated Scapular Retraction  - 3 x daily - 7 x weekly - 1 sets - 10 reps - Seated Bilateral Shoulder Flexion Towel Slide at Table Top  - 1 x daily - 7 x weekly - 3 sets - 5 reps - 5 sec hold hold  ASSESSMENT:  CLINICAL IMPRESSION: Patient is a 77 y.o. woman who was seen today for physical therapy evaluation and treatment for left shoulder pain. Patient is TTP on anterior left deltoid. Patient is limited with behind the back and over the head reaching. Patient's strength was not tested due to irritation and tolerance deficits. Patient will benefit from skilled therapy in order to regain strength and ROM to be able to dress and be more functional and mobile with ADL's without limitation deficits.  OBJECTIVE IMPAIRMENTS: decreased activity tolerance, decreased ROM, and decreased strength.   ACTIVITY LIMITATIONS: carrying, lifting, sleeping, bathing, dressing, reach over head, and hygiene/grooming  PARTICIPATION LIMITATIONS: meal prep, cleaning, laundry, shopping, community activity, and yard work  PERSONAL FACTORS: 3+ comorbidities: chronic low back pain, DDD, shortness of breath, arthritis  are also affecting patient's functional outcome.   REHAB POTENTIAL: Good  CLINICAL DECISION MAKING: evolving /moderate increased pain with movement and pain causing increased difficulty with sleeping   EVALUATION COMPLEXITY: Moderate   GOALS: Goals reviewed with patient? Yes  SHORT TERM GOALS: Target date:  5/14  Patient will increase shoulder flexion and abduction by 10 degrees. Baseline: Goal status: INITIAL  2.  Patient will increase left UE strength by 5 lbs. Baseline:  Goal status: INITIAL  3.  Patient will be independent of HEP. Baseline:  Goal status: INITIAL    LONG TERM GOALS: Target date: 6/4  Patient will be able to carry groceries with her left hand without any pain.  Baseline:  Goal status: INITIAL  2.  Patient will be able to reach behind her back and head without any significantly increase in pain in order to be able to dress.  Baseline:  Goal status: INITIAL  3.  Patient will return to ADL's without limitations or pain.   Baseline:  Goal status: INITIAL       PLAN:  PT FREQUENCY: 1-2x/week  PT DURATION: 8 weeks  PLANNED INTERVENTIONS: Therapeutic exercises, Therapeutic activity, Neuromuscular re-education, Balance training, Gait training, Patient/Family education, Self Care, Joint mobilization, Electrical stimulation, Cryotherapy, Moist heat, Taping, Ultrasound, Ionotophoresis /ml Dexamethasone, and Manual therapy  PLAN FOR NEXT SESSION: STM to left lateral deltoid/ Biceps , PROM in flexion and ER shoulder ER. Consider wand flexion and cane chest press if tolerated. Consider Ionto or ultrasound to decrease acute inflammation    Dessie Coma, PT 08/14/2022, 10:01 AM

## 2022-08-13 NOTE — Therapy (Unsigned)
OUTPATIENT PHYSICAL THERAPY SHOULDER EVALUATION   Patient Name: Michelle [name] MRN: [id number] DOB:24-May-1945, 77 y.o., female Today's Date: 08/13/2022  END OF SESSION:   Past Medical History:  Diagnosis Date   Acute hypoxemic respiratory failure (HCC) 04/24/2017   Anxiety    Arthritis    "back" (04/24/2017)   Atherosclerosis of coronary artery    Blurry vision, left eye    "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 depression only   DOE (dyspnea on exertion)    Fatty liver    History of diverticulitis 02/2018   Hx of adenomatous polyp of colon 03/21/2014   Hyperlipidemia    Hypertension    IBS (irritable bowel syndrome)    OSA on CPAP 12/07/2016   Pneumonia 1990s X 1   SOB (shortness of breath)    Vitamin D deficiency    Past Surgical History:  Procedure Laterality Date   APPENDECTOMY  1967   BOTOX INJECTION     "face"   CATARACT EXTRACTION W/ INTRAOCULAR LENS IMPLANT Bilateral 2000s   CESAREAN SECTION  1972; 1975   COLONOSCOPY W/ BIOPSIES AND POLYPECTOMY  "a few"   DILATION AND CURETTAGE OF UTERUS     HYSTEROSCOPY WITH D & C  08/29/2006   and resection of endometrial polyp/notes 09/10/2010   IR IMAGING GUIDED PORT INSERTION  01/18/2021   JOINT REPLACEMENT     KNEE ARTHROSCOPY Bilateral    NASAL SEPTUM SURGERY  1972   OOPHORECTOMY Right 1994   TOTAL KNEE ARTHROPLASTY  08/05/2011   Procedure: TOTAL KNEE ARTHROPLASTY;  Surgeon: Loanne Drilling, MD;  Location: [facility];  Service: Orthopedics;  Laterality: Right;   TOTAL KNEE ARTHROPLASTY Left 05/2009   TUBAL LIGATION  1975   Patient Active Problem List   Diagnosis Date Noted   Iron overload 06/20/2022   Dyspnea 03/25/2022   Port-A-Cath in place 08/27/2021   Macrocytosis without anemia  08/30/2020   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  Rationale for Evaluation and Treatment: Rehabilitation  ONSET DATE: 3 months  SUBJECTIVE:  SUBJECTIVE STATEMENT: MRI a tore ligament bicep. About 3 months. Sleep on the left   Hand dominance: Right  PERTINENT HISTORY: DDD, low back pain  PAIN:  Are you having pain? {OPRCPAIN:27236} Sometimes sharper than other. Currently it is "aching"  PRECAUTIONS: Back  WEIGHT BEARING RESTRICTIONS: No  FALLS:  Has patient fallen in last 6 months? No  LIVING ENVIRONMENT:   OCCUPATION: Retired  PLOF: Independent  PATIENT GOALS: Be able to dress without discomfort,   NEXT MD VISIT:   OBJECTIVE:   DIAGNOSTIC FINDINGS:  ***  PATIENT SURVEYS:  FOTO ***  COGNITION: Overall cognitive status: Within functional limits for tasks assessed     SENSATION: {sensation:27233}  POSTURE: ***  UPPER EXTREMITY ROM:   {AROM/PROM:27142} ROM Right eval Left eval  Shoulder flexion  110  Shoulder extension    Shoulder abduction  75  Shoulder adduction    Shoulder internal rotation    Shoulder external rotation    Elbow flexion    Elbow extension    Wrist flexion    Wrist extension    Wrist ulnar deviation    Wrist radial deviation    Wrist  pronation    Wrist supination    (Blank rows = not tested)  UPPER EXTREMITY MMT:  MMT Right eval Left eval  Shoulder flexion    Shoulder extension    Shoulder abduction    Shoulder adduction    Shoulder internal rotation    Shoulder external rotation    Middle trapezius    Lower trapezius    Elbow flexion    Elbow extension    Wrist flexion    Wrist extension    Wrist ulnar deviation    Wrist radial deviation    Wrist pronation    Wrist supination    Grip strength (lbs)    (Blank rows = not tested)  SHOULDER SPECIAL TESTS:   JOINT MOBILITY TESTING:    PALPATION:  ***   TODAY'S TREATMENT:                                                                                                                                         DATE: ***   PATIENT EDUCATION: Education details: *** Person educated: Patient Education method: Programmer, multimedia, Facilities manager, Actor cues, Verbal cues, and Handouts Education comprehension: verbalized understanding, returned demonstration, verbal cues required, tactile cues required, and needs further education  HOME EXERCISE PROGRAM: ***  ASSESSMENT:  CLINICAL IMPRESSION: Patient is a 77 y.o. woman who was seen today for physical therapy evaluation and treatment for left shoulder pain.   OBJECTIVE IMPAIRMENTS: {opptimpairments:25111}.   ACTIVITY LIMITATIONS: {activitylimitations:27494}  PARTICIPATION LIMITATIONS: {participationrestrictions:25113}  PERSONAL FACTORS: {Personal factors:25162} are also affecting patient's functional outcome.   REHAB POTENTIAL: {rehabpotential:25112}  CLINICAL DECISION MAKING: {clinical decision making:25114}  EVALUATION COMPLEXITY: {Evaluation complexity:25115}   GOALS: Goals reviewed with patient? {yes/no:20286}  SHORT TERM GOALS: Target date: ***  ***  Baseline: Goal status: {GOALSTATUS:25110}  2.  *** Baseline:  Goal status: {GOALSTATUS:25110}  3.  *** Baseline:  Goal status:  {GOALSTATUS:25110}  4.  *** Baseline:  Goal status: {GOALSTATUS:25110}  5.  *** Baseline:  Goal status: {GOALSTATUS:25110}  6.  *** Baseline:  Goal status: {GOALSTATUS:25110}  LONG TERM GOALS: Target date: ***  *** Baseline:  Goal status: {GOALSTATUS:25110}  2.  *** Baseline:  Goal status: {GOALSTATUS:25110}  3.  *** Baseline:  Goal status: {GOALSTATUS:25110}  4.  *** Baseline:  Goal status: {GOALSTATUS:25110}  5.  *** Baseline:  Goal status: {GOALSTATUS:25110}  6.  *** Baseline:  Goal status: {GOALSTATUS:25110}  PLAN:  PT FREQUENCY: {rehab frequency:25116}  PT DURATION: {rehab duration:25117}  PLANNED INTERVENTIONS: {rehab planned interventions:25118::"Therapeutic exercises","Therapeutic activity","Neuromuscular re-education","Balance training","Gait training","Patient/Family education","Self Care","Joint mobilization"}  PLAN FOR NEXT SESSION: ***   Dessie Coma, PT 08/13/2022, 2:32 PM

## 2022-08-13 NOTE — Therapy (Unsigned)
OUTPATIENT PHYSICAL THERAPY SHOULDER EVALUATION   Patient Name: Michelle [name] MRN: [id number] DOB:[birth year], 77 y.o., female Today's Date: 08/13/2022  END OF SESSION:  PT End of Session - 08/13/22 1518     Visit Number 1    PT Start Time 0230    PT Stop Time 0301    PT Time Calculation (min) 31 min    Activity Tolerance Patient tolerated treatment well    Behavior During Therapy [facility] for tasks assessed/performed             Past Medical History:  Diagnosis Date   Acute hypoxemic respiratory failure (HCC) 04/24/2017   Anxiety    Arthritis    "back" (04/24/2017)   Atherosclerosis of coronary artery    Blurry vision, left eye    "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 depression only   DOE (dyspnea on exertion)    Fatty liver    History of diverticulitis 02/2018   Hx of adenomatous polyp of colon 03/21/2014   Hyperlipidemia    Hypertension    IBS (irritable bowel syndrome)    OSA on CPAP 12/07/2016   Pneumonia 1990s X 1   SOB (shortness of breath)    Vitamin D deficiency    Past Surgical History:  Procedure Laterality Date   APPENDECTOMY  1967   BOTOX INJECTION     "face"   CATARACT EXTRACTION W/ INTRAOCULAR LENS IMPLANT Bilateral 2000s   CESAREAN SECTION  1972; 1975   COLONOSCOPY W/ BIOPSIES AND POLYPECTOMY  "a few"   DILATION AND CURETTAGE OF UTERUS     HYSTEROSCOPY WITH D & C  08/29/2006   and resection of endometrial polyp/notes 09/10/2010   IR IMAGING GUIDED PORT INSERTION  01/18/2021   JOINT REPLACEMENT     KNEE ARTHROSCOPY Bilateral    NASAL SEPTUM SURGERY  1972   OOPHORECTOMY Right 1994   TOTAL KNEE ARTHROPLASTY  08/05/2011   Procedure: TOTAL KNEE ARTHROPLASTY;  Surgeon: Loanne Drilling, MD;  Location: [facility];  Service:  Orthopedics;  Laterality: Right;   TOTAL KNEE ARTHROPLASTY Left 05/2009   TUBAL LIGATION  1975   Patient Active Problem List   Diagnosis Date Noted   Iron overload 06/20/2022   Dyspnea 03/25/2022   Port-A-Cath in place 08/27/2021   Macrocytosis without anemia 08/30/2020   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  Rationale for Evaluation and Treatment: Rehabilitation  ONSET DATE: 3 months  SUBJECTIVE:  SUBJECTIVE STATEMENT: MRI a tore ligament bicep. About 3 months. Sleep on the left   Hand dominance: Right  PERTINENT HISTORY: DDD, low back pain  PAIN:  Are you having pain? {OPRCPAIN:27236} Sometimes sharper than other. Currently it is "aching"  PRECAUTIONS: Back  WEIGHT BEARING RESTRICTIONS: No  FALLS:  Has patient fallen in last 6 months? No  LIVING ENVIRONMENT:   OCCUPATION: Retired  PLOF: Independent  PATIENT GOALS: Be able to dress without discomfort,   NEXT MD VISIT:   OBJECTIVE:   DIAGNOSTIC FINDINGS:  ***  PATIENT SURVEYS:  FOTO ***  COGNITION: Overall cognitive status: Within functional limits for tasks assessed     SENSATION: {sensation:27233}  POSTURE: ***  UPPER EXTREMITY ROM:   {AROM/PROM:27142} ROM Right eval Left eval  Shoulder flexion  110   Shoulder extension    Shoulder abduction  75  Shoulder adduction    Shoulder internal rotation    Shoulder external rotation    Elbow flexion    Elbow extension    Wrist flexion    Wrist extension    Wrist ulnar deviation    Wrist radial deviation    Wrist pronation    Wrist supination    (Blank rows = not tested)  UPPER EXTREMITY MMT:  MMT Right eval Left eval  Shoulder flexion    Shoulder extension    Shoulder abduction    Shoulder adduction    Shoulder internal rotation    Shoulder external rotation    Middle trapezius    Lower trapezius    Elbow flexion    Elbow extension    Wrist flexion    Wrist extension    Wrist ulnar deviation    Wrist radial deviation    Wrist pronation    Wrist supination    Grip strength (lbs)    (Blank rows = not tested)  SHOULDER SPECIAL TESTS:   JOINT MOBILITY TESTING:    PALPATION:  ***   TODAY'S TREATMENT:                                                                                                                                         DATE: ***   PATIENT EDUCATION: Education details: *** Person educated: Patient Education method: Programmer, multimedia, Facilities manager, Actor cues, Verbal cues, and Handouts Education comprehension: verbalized understanding, returned demonstration, verbal cues required, tactile cues required, and needs further education  HOME EXERCISE PROGRAM: Access Code: [email or web address] URL: [email or web address] Date: 08/13/2022 Prepared by: Lorayne Bender  Exercises - Seated Scapular Retraction  - 3 x daily - 7 x weekly - 1 sets - 10 reps - Seated Bilateral Shoulder Flexion Towel Slide at Table Top  - 1 x daily - 7 x weekly - 3 sets - 5 reps - 5 sec hold hold  ASSESSMENT:  CLINICAL IMPRESSION: Patient is a 77 y.o. woman who was seen today for physical therapy evaluation and treatment for  left shoulder pain.   OBJECTIVE IMPAIRMENTS: {opptimpairments:25111}.   ACTIVITY LIMITATIONS:  {activitylimitations:27494}  PARTICIPATION LIMITATIONS: {participationrestrictions:25113}  PERSONAL FACTORS: {Personal factors:25162} are also affecting patient's functional outcome.   REHAB POTENTIAL: {rehabpotential:25112}  CLINICAL DECISION MAKING: {clinical decision making:25114}  EVALUATION COMPLEXITY: {Evaluation complexity:25115}   GOALS: Goals reviewed with patient? {yes/no:20286}  SHORT TERM GOALS: Target date: ***  *** Baseline: Goal status: {GOALSTATUS:25110}  2.  *** Baseline:  Goal status: {GOALSTATUS:25110}  3.  *** Baseline:  Goal status: {GOALSTATUS:25110}  4.  *** Baseline:  Goal status: {GOALSTATUS:25110}  5.  *** Baseline:  Goal status: {GOALSTATUS:25110}  6.  *** Baseline:  Goal status: {GOALSTATUS:25110}  LONG TERM GOALS: Target date: ***  *** Baseline:  Goal status: {GOALSTATUS:25110}  2.  *** Baseline:  Goal status: {GOALSTATUS:25110}  3.  *** Baseline:  Goal status: {GOALSTATUS:25110}  4.  *** Baseline:  Goal status: {GOALSTATUS:25110}  5.  *** Baseline:  Goal status: {GOALSTATUS:25110}  6.  *** Baseline:  Goal status: {GOALSTATUS:25110}  PLAN:  PT FREQUENCY: {rehab frequency:25116}  PT DURATION: {rehab duration:25117}  PLANNED INTERVENTIONS: {rehab planned interventions:25118::"Therapeutic exercises","Therapeutic activity","Neuromuscular re-education","Balance training","Gait training","Patient/Family education","Self Care","Joint mobilization"}  PLAN FOR NEXT SESSION: ***   Cristal Ford, Student-PT 08/13/2022, 3:38 PM

## 2022-08-14 ENCOUNTER — Encounter (HOSPITAL_BASED_OUTPATIENT_CLINIC_OR_DEPARTMENT_OTHER): Payer: Self-pay | Admitting: Physical Therapy

## 2022-08-21 DIAGNOSIS — M25512 Pain in left shoulder: Secondary | ICD-10-CM | POA: Diagnosis not present

## 2022-08-22 ENCOUNTER — Encounter (HOSPITAL_BASED_OUTPATIENT_CLINIC_OR_DEPARTMENT_OTHER): Payer: Self-pay | Admitting: Physical Therapy

## 2022-08-22 ENCOUNTER — Ambulatory Visit (HOSPITAL_BASED_OUTPATIENT_CLINIC_OR_DEPARTMENT_OTHER): Payer: Medicare Other | Attending: Sports Medicine | Admitting: Physical Therapy

## 2022-08-22 DIAGNOSIS — M5459 Other low back pain: Secondary | ICD-10-CM | POA: Insufficient documentation

## 2022-08-22 DIAGNOSIS — M25512 Pain in left shoulder: Secondary | ICD-10-CM

## 2022-08-22 DIAGNOSIS — R262 Difficulty in walking, not elsewhere classified: Secondary | ICD-10-CM | POA: Diagnosis not present

## 2022-08-22 DIAGNOSIS — M25612 Stiffness of left shoulder, not elsewhere classified: Secondary | ICD-10-CM | POA: Diagnosis not present

## 2022-08-22 DIAGNOSIS — M6281 Muscle weakness (generalized): Secondary | ICD-10-CM | POA: Diagnosis not present

## 2022-08-22 NOTE — Therapy (Signed)
OUTPATIENT PHYSICAL THERAPY SHOULDER EVALUATION   Patient Name: Michelle [name] MRN: [id number] DOB:[birth year], 77 y.o., female Today's Date: 08/23/2022  END OF SESSION:  PT End of Session - 08/22/22 1501     Visit Number 2    Number of Visits 12    Date for PT Re-Evaluation 09/25/22    Authorization Type [employer] MCR    PT Start Time 1430    PT Stop Time 1512    PT Time Calculation (min) 42 min    Activity Tolerance Patient tolerated treatment well    Behavior During Therapy WFL for tasks assessed/performed             Past Medical History:  Diagnosis Date   Acute hypoxemic respiratory failure (HCC) 04/24/2017   Anxiety    Arthritis    "back" (04/24/2017)   Atherosclerosis of coronary artery    Blurry vision, left eye    "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 depression only   DOE (dyspnea on exertion)    Fatty liver    History of diverticulitis 02/2018   Hx of adenomatous polyp of colon 03/21/2014   Hyperlipidemia    Hypertension    IBS (irritable bowel syndrome)    OSA on CPAP 12/07/2016   Pneumonia 1990s X 1   SOB (shortness of breath)    Vitamin D deficiency    Past Surgical History:  Procedure Laterality Date   APPENDECTOMY  1967   BOTOX INJECTION     "face"   CATARACT EXTRACTION W/ INTRAOCULAR LENS IMPLANT Bilateral 2000s   CESAREAN SECTION  1972; 1975   COLONOSCOPY W/ BIOPSIES AND POLYPECTOMY  "a few"   DILATION AND CURETTAGE OF UTERUS     HYSTEROSCOPY WITH D & C  08/29/2006   and resection of endometrial polyp/notes 09/10/2010   IR IMAGING GUIDED PORT INSERTION  01/18/2021   JOINT REPLACEMENT     KNEE ARTHROSCOPY Bilateral    NASAL SEPTUM SURGERY  1972   OOPHORECTOMY Right 1994   TOTAL KNEE ARTHROPLASTY  08/05/2011    Procedure: TOTAL KNEE ARTHROPLASTY;  Surgeon: Loanne Drilling, MD;  Location: [facility];  Service: Orthopedics;  Laterality: Right;   TOTAL KNEE ARTHROPLASTY Left 05/2009   TUBAL LIGATION  1975   Patient Active Problem List   Diagnosis Date Noted   Iron overload 06/20/2022   Dyspnea 03/25/2022   Port-A-Cath in place 08/27/2021   Macrocytosis without anemia 08/30/2020   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   Cough 05/14/2017   ETOH abuse 04/27/2017   Abnormal LFTs 04/26/2017   Thrombocytopenia (HCC) 04/26/2017   Dyspnea on exertion 03/18/2017   Morbid obesity (HCC) 03/18/2017   Obstructive sleep apnea 12/07/2016   Essential hypertension 12/07/2016   Hx of adenomatous polyp of colon 03/21/2014   Postop Hyponatremia 08/07/2011   OA (osteoarthritis) of knee 08/05/2011   Pain in limb 05/15/2011    PCP: [name], MD  REFERRING PROVIDER: [name], Md  REFERRING DIAG: [phone] (ICD-10-CM) - Pain in left shoulder   THERAPY DIAG:  Acute pain of left shoulder  Stiffness of left shoulder, not elsewhere classified  Muscle weakness (generalized)  Rationale for Evaluation and Treatment: Rehabilitation  ONSET DATE: January 2024  SUBJECTIVE:  SUBJECTIVE STATEMENT: The patient reports that last night she had significant pain .  She had difficulty finding a comfortable position.  She reports that overall she has been very sore.  He reported that she is to see a Careers adviser.   Eval Patient is a 77 y/o women with left shoulder pain that started 3 months ago. Patient does not remember a MOI. Patient had a MRI done last month which reports a bicep tear. Patient is right hand dominant but sleeps on her left side. Patient reports that she is having trouble dressing. Patient reports pain and discomfort  with over the head and behind the back reaching with left hand.   Hand dominance: Right  PERTINENT HISTORY: DDD, chronic low back pain, shortness of breath  PAIN:  Sometimes it is sharp. Currently it is "aching"  PRECAUTIONS: Back  WEIGHT BEARING RESTRICTIONS: No  FALLS:  Has patient fallen in last 6 months? No  LIVING ENVIRONMENT:   OCCUPATION: Retired  PLOF: Independent  PATIENT GOALS: Be able to dress without discomfort  NEXT MD VISIT:   OBJECTIVE:   DIAGNOSTIC FINDINGS:    PATIENT SURVEYS:    COGNITION: Overall cognitive status: Within functional limits for tasks assessed     SENSATION: WFL  POSTURE: Rounded shoulders / forward head   UPPER EXTREMITY ROM:   Active ROM Right eval Left eval  Shoulder flexion  110  Shoulder extension    Shoulder abduction  75  Shoulder adduction    Shoulder internal rotation    Shoulder external rotation    Elbow flexion    Elbow extension    Wrist flexion    Wrist extension    Wrist ulnar deviation    Wrist radial deviation    Wrist pronation    Wrist supination    (Blank rows = not tested)  UPPER EXTREMITY MMT:  MMT Right eval Left eval  Shoulder flexion    Shoulder extension    Shoulder abduction    Shoulder adduction    Shoulder internal rotation    Shoulder external rotation    Middle trapezius    Lower trapezius    Elbow flexion    Elbow extension    Wrist flexion    Wrist extension    Wrist ulnar deviation    Wrist radial deviation    Wrist pronation    Wrist supination    Grip strength (lbs)    (Blank rows = not tested) not tested 2nd to irritation   SHOULDER SPECIAL TESTS:   JOINT MOBILITY TESTING:    PALPATION:  TTP anterior left deltoid   TODAY'S TREATMENT:                                                                                                                                         5/3 Manual: Trigger point release to upper trap and posterior shoulder.  Grade 1  and  2  inferior and posterior glides gentle passive range of motion into external rotation and flexion  Wand press in pain-free range 3 x 10 Active external rotation 3 x 10 supine with towel underneath her arm in pain-free range  Reviewed use of Thera cane for self soft tissue mobilization Updated HEP and reviewed HEP.  Scap retraction 3 x 10    DATE: Access Code: [email or web address] URL: [email or web address] Date: 08/13/2022 Prepared by: Lorayne Bender   Exercises - Seated Scapular Retraction  - 3 x daily - 7 x weekly - 1 sets - 10 reps - Seated Bilateral Shoulder Flexion Towel Slide at Table Top  - 1 x daily - 7 x weekly - 3 sets - 5 reps - 5 sec hold hold   PATIENT EDUCATION: Education details: POC, HEP, anatomy education, pain management  Person educated: Patient Education method: Explanation, Demonstration, Tactile cues, Verbal cues, and Handouts Education comprehension: verbalized understanding, returned demonstration, verbal cues required, tactile cues required, and needs further education  HOME EXERCISE PROGRAM: Access Code: [email or web address] URL: [email or web address] Date: 08/13/2022 Prepared by: Lorayne Bender   Exercises - Seated Scapular Retraction  - 3 x daily - 7 x weekly - 1 sets - 10 reps - Seated Bilateral Shoulder Flexion Towel Slide at Table Top  - 1 x daily - 7 x weekly - 3 sets - 5 reps - 5 sec hold hold  ASSESSMENT:  CLINICAL IMPRESSION: The patient had improved range of motion with manual therapy.  She reported no significant increase in pain with treatment.  She was advised to keep all of her exercises in pain-free ranges.  We updated her HEP.  She has significant trigger points in her upper trap and significant tenderness to palpation in her biceps.  Therapy will continue to progress exercises as tolerated but at this time it remains in a very irritated state.  Therapy will consider ionto or ultrasound next visit..  OBJECTIVE IMPAIRMENTS:  decreased activity tolerance, decreased ROM, and decreased strength.   ACTIVITY LIMITATIONS: carrying, lifting, sleeping, bathing, dressing, reach over head, and hygiene/grooming  PARTICIPATION LIMITATIONS: meal prep, cleaning, laundry, shopping, community activity, and yard work  PERSONAL FACTORS: 3+ comorbidities: chronic low back pain, DDD, shortness of breath, arthritis  are also affecting patient's functional outcome.   REHAB POTENTIAL: Good  CLINICAL DECISION MAKING: evolving /moderate increased pain with movement and pain causing increased difficulty with sleeping   EVALUATION COMPLEXITY: Moderate   GOALS: Goals reviewed with patient? Yes  SHORT TERM GOALS: Target date: 5/14  Patient will increase shoulder flexion and abduction by 10 degrees. Baseline: Goal status: INITIAL  2.  Patient will increase left UE strength by 5 lbs. Baseline:  Goal status: INITIAL  3.  Patient will be independent of HEP. Baseline:  Goal status: INITIAL    LONG TERM GOALS: Target date: 6/4  Patient will be able to carry groceries with her left hand without any pain.  Baseline:  Goal status: INITIAL  2.  Patient will be able to reach behind her back and head without any significantly increase in pain in order to be able to dress.  Baseline:  Goal status: INITIAL  3.  Patient will return to ADL's without limitations or pain.   Baseline:  Goal status: INITIAL       PLAN:  PT FREQUENCY: 1-2x/week  PT DURATION: 8 weeks  PLANNED INTERVENTIONS: Therapeutic exercises, Therapeutic activity, Neuromuscular re-education, Balance training, Gait training, Patient/Family education, Self Care, Joint mobilization, Electrical stimulation, Cryotherapy, Moist heat, Taping, Ultrasound,  Ionotophoresis 4mg /ml Dexamethasone, and Manual therapy  PLAN FOR NEXT SESSION: STM to left lateral deltoid/ Biceps , PROM in flexion and ER shoulder ER. Consider wand flexion and cane chest press if tolerated.  Consider Ionto or ultrasound to decrease acute inflammation    Dessie Coma, PT 08/23/2022, 9:25 AM

## 2022-08-26 ENCOUNTER — Encounter (HOSPITAL_BASED_OUTPATIENT_CLINIC_OR_DEPARTMENT_OTHER): Payer: Self-pay

## 2022-08-26 ENCOUNTER — Ambulatory Visit (HOSPITAL_BASED_OUTPATIENT_CLINIC_OR_DEPARTMENT_OTHER): Payer: Medicare Other

## 2022-08-29 ENCOUNTER — Ambulatory Visit (HOSPITAL_BASED_OUTPATIENT_CLINIC_OR_DEPARTMENT_OTHER): Payer: Medicare Other

## 2022-08-29 ENCOUNTER — Encounter (HOSPITAL_BASED_OUTPATIENT_CLINIC_OR_DEPARTMENT_OTHER): Payer: Self-pay

## 2022-08-29 DIAGNOSIS — M25612 Stiffness of left shoulder, not elsewhere classified: Secondary | ICD-10-CM

## 2022-08-29 DIAGNOSIS — M5459 Other low back pain: Secondary | ICD-10-CM | POA: Diagnosis not present

## 2022-08-29 DIAGNOSIS — M6281 Muscle weakness (generalized): Secondary | ICD-10-CM

## 2022-08-29 DIAGNOSIS — M25512 Pain in left shoulder: Secondary | ICD-10-CM | POA: Diagnosis not present

## 2022-08-29 DIAGNOSIS — R262 Difficulty in walking, not elsewhere classified: Secondary | ICD-10-CM | POA: Diagnosis not present

## 2022-08-29 NOTE — Therapy (Signed)
OUTPATIENT PHYSICAL THERAPY SHOULDER EVALUATION   Patient Name: Michelle [name] MRN: [id number] DOB:March 06, 1946, 77 y.o., female Today's Date: 08/29/2022  END OF SESSION:  PT End of Session - 08/29/22 1502     Visit Number 3    Number of Visits 12    Date for PT Re-Evaluation 09/25/22    Authorization Type [employer] MCR    PT Start Time 1445    PT Stop Time 1510   Pt arrived late. 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 depression only   DOE (dyspnea on exertion)    Fatty liver    History of diverticulitis 02/2018   Hx of adenomatous polyp of colon 03/21/2014   Hyperlipidemia    Hypertension    IBS (irritable bowel syndrome)    OSA on CPAP 12/07/2016   Pneumonia 1990s X 1   SOB (shortness of breath)    Vitamin D deficiency    Past Surgical History:  Procedure Laterality Date   APPENDECTOMY  1967   BOTOX INJECTION     "face"   CATARACT EXTRACTION W/ INTRAOCULAR LENS IMPLANT Bilateral 2000s   CESAREAN SECTION  1972; 1975   COLONOSCOPY W/ BIOPSIES AND POLYPECTOMY  "a few"   DILATION AND CURETTAGE OF UTERUS     HYSTEROSCOPY WITH D & C  08/29/2006   and resection of endometrial polyp/notes 09/10/2010   IR IMAGING GUIDED PORT INSERTION  01/18/2021   JOINT REPLACEMENT     KNEE ARTHROSCOPY Bilateral    NASAL SEPTUM SURGERY  1972   OOPHORECTOMY  Right 1994   TOTAL KNEE ARTHROPLASTY  08/05/2011   Procedure: TOTAL KNEE ARTHROPLASTY;  Surgeon: Loanne Drilling, MD;  Location: [facility];  Service: Orthopedics;  Laterality: Right;   TOTAL KNEE ARTHROPLASTY Left 05/2009   TUBAL LIGATION  1975   Patient Active Problem List   Diagnosis Date Noted   Iron overload 06/20/2022   Dyspnea 03/25/2022   Port-A-Cath in place 08/27/2021   Macrocytosis without anemia 08/30/2020   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   Cough 05/14/2017   ETOH abuse 04/27/2017   Abnormal LFTs 04/26/2017   Thrombocytopenia (HCC) 04/26/2017   Dyspnea on exertion 03/18/2017   Morbid obesity (HCC) 03/18/2017   Obstructive sleep apnea 12/07/2016   Essential hypertension 12/07/2016   Hx of adenomatous polyp of colon 03/21/2014   Postop Hyponatremia 08/07/2011   OA (osteoarthritis) of knee 08/05/2011   Pain in limb 05/15/2011    PCP: [name], MD  REFERRING PROVIDER: [name], Md  REFERRING DIAG: [phone] (ICD-10-CM) - Pain in left shoulder   THERAPY DIAG:  Acute pain of left shoulder  Stiffness of left shoulder, not elsewhere classified  Muscle weakness (generalized)  Rationale for  Evaluation and Treatment: Rehabilitation  ONSET DATE: January 2024  SUBJECTIVE:                                                                                                                                                                                      SUBJECTIVE STATEMENT: The patient reports that last night she had significant pain .  She had difficulty finding a comfortable position.  She reports that overall she has been very sore.  He reported that she is to see a Careers adviser.   Eval Patient is a 77 y/o women with left shoulder pain that started 3 months ago. Patient does not remember a MOI. Patient had a MRI done last month which reports a bicep tear. Patient is right hand dominant but sleeps on her left side. Patient reports that she is having  trouble dressing. Patient reports pain and discomfort with over the head and behind the back reaching with left hand.   Hand dominance: Right  PERTINENT HISTORY: DDD, chronic low back pain, shortness of breath  PAIN:  Sometimes it is sharp. Currently it is "aching"  PRECAUTIONS: Back  WEIGHT BEARING RESTRICTIONS: No  FALLS:  Has patient fallen in last 6 months? No  LIVING ENVIRONMENT:   OCCUPATION: Retired  PLOF: Independent  PATIENT GOALS: Be able to dress without discomfort  NEXT MD VISIT:   OBJECTIVE:   DIAGNOSTIC FINDINGS:    PATIENT SURVEYS:    COGNITION: Overall cognitive status: Within functional limits for tasks assessed     SENSATION: WFL  POSTURE: Rounded shoulders / forward head   UPPER EXTREMITY ROM:   Active ROM Right eval Left eval  Shoulder flexion  110  Shoulder extension    Shoulder abduction  75  Shoulder adduction    Shoulder internal rotation    Shoulder external rotation    Elbow flexion    Elbow extension    Wrist flexion    Wrist extension    Wrist ulnar deviation    Wrist radial deviation    Wrist pronation    Wrist supination    (Blank rows = not tested)  UPPER EXTREMITY MMT:  MMT Right eval Left eval  Shoulder flexion    Shoulder extension    Shoulder abduction    Shoulder adduction    Shoulder internal rotation    Shoulder external rotation    Middle trapezius    Lower trapezius    Elbow flexion    Elbow extension    Wrist flexion    Wrist extension    Wrist ulnar deviation    Wrist radial deviation    Wrist pronation    Wrist supination  Grip strength (lbs)    (Blank rows = not tested) not tested 2nd to irritation   SHOULDER SPECIAL TESTS:   JOINT MOBILITY TESTING:    PALPATION:  TTP anterior left deltoid   TODAY'S TREATMENT:                                                                                                                                           5/9: STM to biceps and  deltoid PROM L shoulder Supine wand press x10 Scap squeezes 5" x15 Pulleys flexion x35min Ice x71min seated with pillow under arm  5/3 Manual: Trigger point release to upper trap and posterior shoulder.  Grade 1 and 2  inferior and posterior glides gentle passive range of motion into external rotation and flexion  Wand press in pain-free range 3 x 10 Active external rotation 3 x 10 supine with towel underneath her arm in pain-free range  Reviewed use of Thera cane for self soft tissue mobilization Updated HEP and reviewed HEP.  Scap retraction 3 x 10    DATE: Access Code: [email or web address] URL: [email or web address] Date: 08/13/2022 Prepared by: Lorayne Bender   Exercises - Seated Scapular Retraction  - 3 x daily - 7 x weekly - 1 sets - 10 reps - Seated Bilateral Shoulder Flexion Towel Slide at Table Top  - 1 x daily - 7 x weekly - 3 sets - 5 reps - 5 sec hold hold   PATIENT EDUCATION: Education details: POC, HEP, anatomy education, pain management  Person educated: Patient Education method: Explanation, Demonstration, Tactile cues, Verbal cues, and Handouts Education comprehension: verbalized understanding, returned demonstration, verbal cues required, tactile cues required, and needs further education  HOME EXERCISE PROGRAM: Access Code: [email or web address] URL: [email or web address] Date: 08/13/2022 Prepared by: Lorayne Bender   Exercises - Seated Scapular Retraction  - 3 x daily - 7 x weekly - 1 sets - 10 reps - Seated Bilateral Shoulder Flexion Towel Slide at Table Top  - 1 x daily - 7 x weekly - 3 sets - 5 reps - 5 sec hold hold  ASSESSMENT:  CLINICAL IMPRESSION: Late arrival so tx time was limited. Gaurded with PROM, so frequent cues required for muscle relaxation. Tender throughout length of biceps as well as in deltoid. Education pt about use of ice for pain relief as well as modifying activity level to prevent overuse. Ice was performed at end of session  which pt reported relief from.     OBJECTIVE IMPAIRMENTS: decreased activity tolerance, decreased ROM, and decreased strength.   ACTIVITY LIMITATIONS: carrying, lifting, sleeping, bathing, dressing, reach over head, and hygiene/grooming  PARTICIPATION LIMITATIONS: meal prep, cleaning, laundry, shopping, community activity, and yard work  PERSONAL FACTORS: 3+ comorbidities: chronic low back pain, DDD, shortness of breath, arthritis  are also affecting patient's functional outcome.   REHAB POTENTIAL: Good  CLINICAL DECISION MAKING: evolving /moderate increased pain with movement and  pain causing increased difficulty with sleeping   EVALUATION COMPLEXITY: Moderate   GOALS: Goals reviewed with patient? Yes  SHORT TERM GOALS: Target date: 5/14  Patient will increase shoulder flexion and abduction by 10 degrees. Baseline: Goal status: INITIAL  2.  Patient will increase left UE strength by 5 lbs. Baseline:  Goal status: INITIAL  3.  Patient will be independent of HEP. Baseline:  Goal status: INITIAL    LONG TERM GOALS: Target date: 6/4  Patient will be able to carry groceries with her left hand without any pain.  Baseline:  Goal status: INITIAL  2.  Patient will be able to reach behind her back and head without any significantly increase in pain in order to be able to dress.  Baseline:  Goal status: INITIAL  3.  Patient will return to ADL's without limitations or pain.   Baseline:  Goal status: INITIAL       PLAN:  PT FREQUENCY: 1-2x/week  PT DURATION: 8 weeks  PLANNED INTERVENTIONS: Therapeutic exercises, Therapeutic activity, Neuromuscular re-education, Balance training, Gait training, Patient/Family education, Self Care, Joint mobilization, Electrical stimulation, Cryotherapy, Moist heat, Taping, Ultrasound, Ionotophoresis 4mg /ml Dexamethasone, and Manual therapy  PLAN FOR NEXT SESSION: STM to left lateral deltoid/ Biceps , PROM in flexion and ER shoulder  ER. Consider wand flexion and cane chest press if tolerated. Consider Ionto or ultrasound to decrease acute inflammation    Donnel Saxon Cloie Wooden, PTA 08/29/2022, 4:26 PM

## 2022-09-02 ENCOUNTER — Ambulatory Visit (HOSPITAL_BASED_OUTPATIENT_CLINIC_OR_DEPARTMENT_OTHER): Payer: Medicare Other

## 2022-09-02 ENCOUNTER — Encounter (HOSPITAL_BASED_OUTPATIENT_CLINIC_OR_DEPARTMENT_OTHER): Payer: Self-pay

## 2022-09-02 DIAGNOSIS — M25512 Pain in left shoulder: Secondary | ICD-10-CM | POA: Diagnosis not present

## 2022-09-02 DIAGNOSIS — M6281 Muscle weakness (generalized): Secondary | ICD-10-CM | POA: Diagnosis not present

## 2022-09-02 DIAGNOSIS — M25612 Stiffness of left shoulder, not elsewhere classified: Secondary | ICD-10-CM

## 2022-09-02 DIAGNOSIS — R262 Difficulty in walking, not elsewhere classified: Secondary | ICD-10-CM | POA: Diagnosis not present

## 2022-09-02 DIAGNOSIS — M5459 Other low back pain: Secondary | ICD-10-CM | POA: Diagnosis not present

## 2022-09-02 NOTE — Therapy (Signed)
OUTPATIENT PHYSICAL THERAPY SHOULDER TREATMENT   Patient Name: Michelle [name] MRN: [id number] DOB:03/05/1946, 77 y.o., female Today's Date: 09/02/2022  END OF SESSION:  PT End of Session - 09/02/22 1434     Visit Number 4    Number of Visits 12    Date for PT Re-Evaluation 09/25/22    Authorization Type [employer] MCR    PT Start Time 1434    PT Stop Time 1515    PT Time Calculation (min) 41 min    Activity Tolerance Patient tolerated treatment well    Behavior During Therapy WFL for tasks assessed/performed               Past Medical History:  Diagnosis Date   Acute hypoxemic respiratory failure (HCC) 04/24/2017   Anxiety    Arthritis    "back" (04/24/2017)   Atherosclerosis of coronary artery    Blurry vision, left eye    "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 depression only   DOE (dyspnea on exertion)    Fatty liver    History of diverticulitis 02/2018   Hx of adenomatous polyp of colon 03/21/2014   Hyperlipidemia    Hypertension    IBS (irritable bowel syndrome)    OSA on CPAP 12/07/2016   Pneumonia 1990s X 1   SOB (shortness of breath)    Vitamin D deficiency    Past Surgical History:  Procedure Laterality Date   APPENDECTOMY  1967   BOTOX INJECTION     "face"   CATARACT EXTRACTION W/ INTRAOCULAR LENS IMPLANT Bilateral 2000s   CESAREAN SECTION  1972; 1975   COLONOSCOPY W/ BIOPSIES AND POLYPECTOMY  "a few"   DILATION AND CURETTAGE OF UTERUS     HYSTEROSCOPY WITH D & C  08/29/2006   and resection of endometrial polyp/notes 09/10/2010   IR IMAGING GUIDED PORT INSERTION  01/18/2021   JOINT REPLACEMENT     KNEE ARTHROSCOPY Bilateral    NASAL SEPTUM SURGERY  1972   OOPHORECTOMY Right 1994   TOTAL KNEE ARTHROPLASTY  08/05/2011    Procedure: TOTAL KNEE ARTHROPLASTY;  Surgeon: Loanne Drilling, MD;  Location: [facility];  Service: Orthopedics;  Laterality: Right;   TOTAL KNEE ARTHROPLASTY Left 05/2009   TUBAL LIGATION  1975   Patient Active Problem List   Diagnosis Date Noted   Iron overload 06/20/2022   Dyspnea 03/25/2022   Port-A-Cath in place 08/27/2021   Macrocytosis without anemia 08/30/2020   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   Cough 05/14/2017   ETOH abuse 04/27/2017   Abnormal LFTs 04/26/2017   Thrombocytopenia (HCC) 04/26/2017   Dyspnea on exertion 03/18/2017   Morbid obesity (HCC) 03/18/2017   Obstructive sleep apnea 12/07/2016   Essential hypertension 12/07/2016   Hx of adenomatous polyp of colon 03/21/2014   Postop Hyponatremia 08/07/2011   OA (osteoarthritis) of knee 08/05/2011   Pain in limb 05/15/2011    PCP: [name], MD  REFERRING PROVIDER: [name], Md  REFERRING DIAG: [phone] (ICD-10-CM) - Pain in left shoulder   THERAPY DIAG:  Acute pain of left shoulder  Stiffness of left shoulder, not elsewhere classified  Muscle weakness (generalized)  Rationale for Evaluation and Treatment: Rehabilitation  ONSET DATE: January 2024  SUBJECTIVE:  SUBJECTIVE STATEMENT: No pain at rest. Has some shoulder pain when driving with her hand on the steering wheel. Sees Dr. Shon Baton for her back tomorrow.    Eval Patient is a 77 y/o women with left shoulder pain that started 3 months ago. Patient does not remember a MOI. Patient had a MRI done last month which reports a bicep tear. Patient is right hand dominant but sleeps on her left side. Patient reports that she is having trouble dressing. Patient reports pain and discomfort with over the head and behind the back reaching with left hand.   Hand  dominance: Right  PERTINENT HISTORY: DDD, chronic low back pain, shortness of breath  PAIN:  Sometimes it is sharp. Currently it is "aching"  PRECAUTIONS: Back  WEIGHT BEARING RESTRICTIONS: No  FALLS:  Has patient fallen in last 6 months? No  LIVING ENVIRONMENT:   OCCUPATION: Retired  PLOF: Independent  PATIENT GOALS: Be able to dress without discomfort  NEXT MD VISIT:   OBJECTIVE:   DIAGNOSTIC FINDINGS:    PATIENT SURVEYS:    COGNITION: Overall cognitive status: Within functional limits for tasks assessed     SENSATION: WFL  POSTURE: Rounded shoulders / forward head   UPPER EXTREMITY ROM:   Active ROM Right eval Left eval  Shoulder flexion  110  Shoulder extension    Shoulder abduction  75  Shoulder adduction    Shoulder internal rotation    Shoulder external rotation    Elbow flexion    Elbow extension    Wrist flexion    Wrist extension    Wrist ulnar deviation    Wrist radial deviation    Wrist pronation    Wrist supination    (Blank rows = not tested)  UPPER EXTREMITY MMT:  MMT Right eval Left eval  Shoulder flexion    Shoulder extension    Shoulder abduction    Shoulder adduction    Shoulder internal rotation    Shoulder external rotation    Middle trapezius    Lower trapezius    Elbow flexion    Elbow extension    Wrist flexion    Wrist extension    Wrist ulnar deviation    Wrist radial deviation    Wrist pronation    Wrist supination    Grip strength (lbs)    (Blank rows = not tested) not tested 2nd to irritation   SHOULDER SPECIAL TESTS:   JOINT MOBILITY TESTING:    PALPATION:  TTP anterior left deltoid   TODAY'S TREATMENT:                                                                                                                                          5/13: PROM L shoulder Supine wand flexion x10 Scap squeezes 5" x15 Pulleys flexion x6min Bicep curl 2x10 Ball rolls at table flexion and scaption  x10ea [place] flexion (  partial) x10  5/9: STM to biceps and deltoid PROM L shoulder Supine wand press x10 Scap squeezes 5" x15 Pulleys flexion x81min Ice x66min seated with pillow under arm   DATE: Access Code: [email or web address] URL: [email or web address] Date: 08/13/2022 Prepared by: Lorayne Bender   Exercises - Seated Scapular Retraction  - 3 x daily - 7 x weekly - 1 sets - 10 reps - Seated Bilateral Shoulder Flexion Towel Slide at Table Top  - 1 x daily - 7 x weekly - 3 sets - 5 reps - 5 sec hold hold   PATIENT EDUCATION: Education details: POC, HEP, anatomy education, pain management  Person educated: Patient Education method: Explanation, Demonstration, Tactile cues, Verbal cues, and Handouts Education comprehension: verbalized understanding, returned demonstration, verbal cues required, tactile cues required, and needs further education  HOME EXERCISE PROGRAM: Access Code: [email or web address] URL: [email or web address] Date: 08/13/2022 Prepared by: Lorayne Bender   Exercises - Seated Scapular Retraction  - 3 x daily - 7 x weekly - 1 sets - 10 reps - Seated Bilateral Shoulder Flexion Towel Slide at Table Top  - 1 x daily - 7 x weekly - 3 sets - 5 reps - 5 sec hold hold  ASSESSMENT:  CLINICAL IMPRESSION: Improved tolerance for therapeutic interventions today compared to previous session. She was able to tolerate progression to wand flexion in supine. Did have mild dizziness episode with supine to sit transfer which resolved after a short rest break.  Improved ability with pulleys. Will conitnue ot monitor pain level and progress as tolerated.     OBJECTIVE IMPAIRMENTS: decreased activity tolerance, decreased ROM, and decreased strength.   ACTIVITY LIMITATIONS: carrying, lifting, sleeping, bathing, dressing, reach over head, and hygiene/grooming  PARTICIPATION LIMITATIONS: meal prep, cleaning, laundry, shopping, community activity, and yard work  PERSONAL  FACTORS: 3+ comorbidities: chronic low back pain, DDD, shortness of breath, arthritis  are also affecting patient's functional outcome.   REHAB POTENTIAL: Good  CLINICAL DECISION MAKING: evolving /moderate increased pain with movement and pain causing increased difficulty with sleeping   EVALUATION COMPLEXITY: Moderate   GOALS: Goals reviewed with patient? Yes  SHORT TERM GOALS: Target date: 5/14  Patient will increase shoulder flexion and abduction by 10 degrees. Baseline: Goal status: INITIAL  2.  Patient will increase left UE strength by 5 lbs. Baseline:  Goal status: INITIAL  3.  Patient will be independent of HEP. Baseline:  Goal status: IN PROGRESS 5/13    LONG TERM GOALS: Target date: 6/4  Patient will be able to carry groceries with her left hand without any pain.  Baseline:  Goal status: INITIAL  2.  Patient will be able to reach behind her back and head without any significantly increase in pain in order to be able to dress.  Baseline:  Goal status: INITIAL  3.  Patient will return to ADL's without limitations or pain.   Baseline:  Goal status: INITIAL       PLAN:  PT FREQUENCY: 1-2x/week  PT DURATION: 8 weeks  PLANNED INTERVENTIONS: Therapeutic exercises, Therapeutic activity, Neuromuscular re-education, Balance training, Gait training, Patient/Family education, Self Care, Joint mobilization, Electrical stimulation, Cryotherapy, Moist heat, Taping, Ultrasound, Ionotophoresis 4mg /ml Dexamethasone, and Manual therapy  PLAN FOR NEXT SESSION: STM to left lateral deltoid/ Biceps , PROM in flexion and ER shoulder ER. Consider wand flexion and cane chest press if tolerated. Consider Ionto or ultrasound to decrease acute inflammation    Donnel Saxon Harlan Vinal, PTA 09/02/2022, 3:54 PM

## 2022-09-03 DIAGNOSIS — M5451 Vertebrogenic low back pain: Secondary | ICD-10-CM | POA: Diagnosis not present

## 2022-09-05 ENCOUNTER — Ambulatory Visit (HOSPITAL_BASED_OUTPATIENT_CLINIC_OR_DEPARTMENT_OTHER): Payer: Medicare Other | Admitting: Physical Therapy

## 2022-09-05 ENCOUNTER — Encounter (HOSPITAL_BASED_OUTPATIENT_CLINIC_OR_DEPARTMENT_OTHER): Payer: Self-pay | Admitting: Physical Therapy

## 2022-09-05 DIAGNOSIS — M5459 Other low back pain: Secondary | ICD-10-CM | POA: Diagnosis not present

## 2022-09-05 DIAGNOSIS — M6281 Muscle weakness (generalized): Secondary | ICD-10-CM | POA: Diagnosis not present

## 2022-09-05 DIAGNOSIS — M25612 Stiffness of left shoulder, not elsewhere classified: Secondary | ICD-10-CM

## 2022-09-05 DIAGNOSIS — M25512 Pain in left shoulder: Secondary | ICD-10-CM | POA: Diagnosis not present

## 2022-09-05 DIAGNOSIS — R262 Difficulty in walking, not elsewhere classified: Secondary | ICD-10-CM | POA: Diagnosis not present

## 2022-09-05 NOTE — Therapy (Signed)
OUTPATIENT PHYSICAL THERAPY SHOULDER TREATMENT   Patient Name: Michelle [name] MRN: [id number] DOB:1945-05-19, 77 y.o., female Today's Date: 09/02/2022  END OF SESSION:  PT End of Session - 09/02/22 1434     Visit Number 4    Number of Visits 12    Date for PT Re-Evaluation 09/25/22    Authorization Type BCBS MCR    PT Start Time 1434    PT Stop Time 1515    PT Time Calculation (min) 41 min    Activity Tolerance Patient tolerated treatment well    Behavior During Therapy WFL for tasks assessed/performed               Past Medical History:  Diagnosis Date   Acute hypoxemic respiratory failure (HCC) 04/24/2017   Anxiety    Arthritis    "back" (04/24/2017)   Atherosclerosis of coronary artery    Blurry vision, left eye    "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 depression only   DOE (dyspnea on exertion)    Fatty liver    History of diverticulitis 02/2018   Hx of adenomatous polyp of colon 03/21/2014   Hyperlipidemia    Hypertension    IBS (irritable bowel syndrome)    OSA on CPAP 12/07/2016   Pneumonia 1990s X 1   SOB (shortness of breath)    Vitamin D deficiency    Past Surgical History:  Procedure Laterality Date   APPENDECTOMY  1967   BOTOX INJECTION     "face"   CATARACT EXTRACTION W/ INTRAOCULAR LENS IMPLANT Bilateral 2000s   CESAREAN SECTION  1972; 1975   COLONOSCOPY W/ BIOPSIES AND POLYPECTOMY  "a few"   DILATION AND CURETTAGE OF UTERUS     HYSTEROSCOPY WITH D & C  08/29/2006   and resection of endometrial polyp/notes 09/10/2010   IR IMAGING GUIDED PORT INSERTION  01/18/2021   JOINT REPLACEMENT     KNEE ARTHROSCOPY Bilateral    NASAL SEPTUM SURGERY  1972   OOPHORECTOMY Right 1994   TOTAL KNEE ARTHROPLASTY  08/05/2011    Procedure: TOTAL KNEE ARTHROPLASTY;  Surgeon: Loanne Drilling, MD;  Location: [facility];  Service: Orthopedics;  Laterality: Right;   TOTAL KNEE ARTHROPLASTY Left 05/2009   TUBAL LIGATION  1975   Patient Active Problem List   Diagnosis Date Noted   Iron overload 06/20/2022   Dyspnea 03/25/2022   Port-A-Cath in place 08/27/2021   Macrocytosis without anemia 08/30/2020   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   Cough 05/14/2017   ETOH abuse 04/27/2017   Abnormal LFTs 04/26/2017   Thrombocytopenia (HCC) 04/26/2017   Dyspnea on exertion 03/18/2017   Morbid obesity (HCC) 03/18/2017   Obstructive sleep apnea 12/07/2016   Essential hypertension 12/07/2016   Hx of adenomatous polyp of colon 03/21/2014   Postop Hyponatremia 08/07/2011   OA (osteoarthritis) of knee 08/05/2011   Pain in limb 05/15/2011    PCP: [name], MD  REFERRING PROVIDER: [name], Md  REFERRING DIAG: [phone] (ICD-10-CM) - Pain in left shoulder   THERAPY DIAG:  Acute pain of left shoulder  Stiffness of left shoulder, not elsewhere classified  Muscle weakness (generalized)  Rationale for Evaluation and Treatment: Rehabilitation  ONSET DATE: January 2024  SUBJECTIVE:  SUBJECTIVE STATEMENT: The patient comes in with a prescription for her back. She has had increased low back pain over the past few months. She has increased pain in standing and with ambulation. She has had improved pain in the past with therapy in the pool and with needling on land. She reports her shoulder is doing better.   Eval Patient is a 77 y/o women with left shoulder pain that started 3 months ago. Patient does not remember a MOI. Patient had a MRI done last month which reports a bicep tear. Patient is right hand dominant but sleeps on her left  side. Patient reports that she is having trouble dressing. Patient reports pain and discomfort with over the head and behind the back reaching with left hand.   Hand dominance: Right  PERTINENT HISTORY: DDD, chronic low back pain, shortness of breath  PAIN:  Sometimes it is sharp. Currently it is "aching"  PRECAUTIONS: Back  WEIGHT BEARING RESTRICTIONS: No  FALLS:  Has patient fallen in last 6 months? No  LIVING ENVIRONMENT:   OCCUPATION: Retired  PLOF: Independent  PATIENT GOALS: Be able to dress without discomfort  NEXT MD VISIT:   OBJECTIVE:   DIAGNOSTIC FINDINGS:    PATIENT SURVEYS:    COGNITION: Overall cognitive status: Within functional limits for tasks assessed     SENSATION: WFL  POSTURE: Rounded shoulders / forward head   UPPER EXTREMITY ROM:   Active ROM Right eval Left eval  Shoulder flexion  110  Shoulder extension    Shoulder abduction  75  Shoulder adduction    Shoulder internal rotation    Shoulder external rotation    Elbow flexion    Elbow extension    Wrist flexion    Wrist extension    Wrist ulnar deviation    Wrist radial deviation    Wrist pronation    Wrist supination    (Blank rows = not tested)  UPPER EXTREMITY MMT:  MMT Right eval Left eval  Shoulder flexion    Shoulder extension    Shoulder abduction    Shoulder adduction    Shoulder internal rotation    Shoulder external rotation    Middle trapezius    Lower trapezius    Elbow flexion    Elbow extension    Wrist flexion    Wrist extension    Wrist ulnar deviation    Wrist radial deviation    Wrist pronation    Wrist supination    Grip strength (lbs)    (Blank rows = not tested) not tested 2nd to irritation    LUMBAR ROM:   Active  A/PROM  eval  Flexion   Extension   Right lateral flexion   Left lateral flexion   Right rotation   Left rotation    (Blank rows = not tested)  LOWER EXTREMITY MMT:    MMT Right eval Left eval  Hip flexion  19.6 13.9  Hip extension    Hip abduction 23.2 22.2  Hip adduction    Hip internal rotation    Hip external rotation    Knee flexion    Knee extension 19.2 21.6  Ankle dorsiflexion    Ankle plantarflexion    Ankle inversion    Ankle eversion     (Blank rows = not tested)  SHOULDER SPECIAL TESTS:   JOINT MOBILITY TESTING:    PALPATION:  TTP anterior left deltoid   TODAY'S TREATMENT:  5/16 Pulleys: 2 min flexion  Manual: trigger point release to upper trap; PROM into flexion and ER; Trigger point release to pec   Table slide for shoulder and back  Reviewed use of thera-cane for her lower back. Patient will require further cuing on use for desensitization.    5/13: PROM L shoulder Supine wand flexion x10 Scap squeezes 5" x15 Pulleys flexion x72min Bicep curl 2x10 Ball rolls at table flexion and scaption x10ea [place] flexion (partial) x10  5/9: STM to biceps and deltoid PROM L shoulder Supine wand press x10 Scap squeezes 5" x15 Pulleys flexion x63min Ice x15min seated with pillow under arm   DATE: Access Code: [email or web address] URL: [email or web address] Date: 08/13/2022 Prepared by: Lorayne Bender   Exercises - Seated Scapular Retraction  - 3 x daily - 7 x weekly - 1 sets - 10 reps - Seated Bilateral Shoulder Flexion Towel Slide at Table Top  - 1 x daily - 7 x weekly - 3 sets - 5 reps - 5 sec hold hold   PATIENT EDUCATION: Education details: POC, HEP, anatomy education, pain management  Person educated: Patient Education method: Explanation, Demonstration, Tactile cues, Verbal cues, and Handouts Education comprehension: verbalized understanding, returned demonstration, verbal cues required, tactile cues required, and needs further education  HOME EXERCISE PROGRAM: Access Code: [email or web address] URL:  [email or web address] Date: 08/13/2022 Prepared by: Lorayne Bender   Exercises - Seated Scapular Retraction  - 3 x daily - 7 x weekly - 1 sets - 10 reps - Seated Bilateral Shoulder Flexion Towel Slide at Table Top  - 1 x daily - 7 x weekly - 3 sets - 5 reps - 5 sec hold hold  ASSESSMENT:  CLINICAL IMPRESSION: Patient presented to therapy today with a prescription for physical therapy for her low back for aquatic therapy.  She presents with significant trigger point in her right gluteal and right lower back.  She has good lumbar range of motion.  She has bilateral lower extremity weakness.  She has benefited from aquatic therapy in the past.  She is also benefit from skilled trigger point dry needling in the past.  Therapy reviewed exercises that she can use both for her lumbar spine and shoulder today.  She would benefit from skilled physical therapy to improve her ability to stand and walk as well as to further improve her functional use of her shoulder.  Passive range of her shoulder was measured at 70 degrees of external rotation today and 140 degrees of flexion.  This is a significant improvement from initial evaluation.  She will schedule further visits in the pool as well as continue her land therapy for her shoulder.   OBJECTIVE IMPAIRMENTS: decreased activity tolerance, decreased ROM, and decreased strength.   ACTIVITY LIMITATIONS: carrying, lifting, sleeping, bathing, dressing, reach over head, and hygiene/grooming  PARTICIPATION LIMITATIONS: meal prep, cleaning, laundry, shopping, community activity, and yard work  PERSONAL FACTORS: 3+ comorbidities: chronic low back pain, DDD, shortness of breath, arthritis  are also affecting patient's functional outcome.   REHAB POTENTIAL: Good  CLINICAL DECISION MAKING: evolving /moderate increased pain with movement and pain causing increased difficulty with sleeping   EVALUATION COMPLEXITY: Moderate   GOALS: Goals reviewed  with patient? Yes  SHORT TERM GOALS: Target date: 5/14  Patient will increase shoulder flexion and abduction by 10 degrees. Baseline: Goal status: INITIAL  2.  Patient will increase left UE strength by 5 lbs. Baseline:  Goal status: INITIAL  3.  Patient will be independent of HEP. Baseline:  Goal status: IN PROGRESS 5/13    LONG TERM GOALS: Target date: 6/4  Patient will be able to carry groceries with her left hand without any pain.  Baseline:  Goal status: INITIAL  2.  Patient will be able to reach behind her back and head without any significantly increase in pain in order to be able to dress.  Baseline:  Goal status: INITIAL  3.  Patient will return to ADL's without limitations or pain.   Baseline:  Goal status: INITIAL       PLAN:  PT FREQUENCY: 1-2x/week  PT DURATION: 8 weeks  PLANNED INTERVENTIONS: Therapeutic exercises, Therapeutic activity, Neuromuscular re-education, Balance training, Gait training, Patient/Family education, Self Care, Joint mobilization, Electrical stimulation, Cryotherapy, Moist heat, Taping, Ultrasound, Ionotophoresis 4mg /ml Dexamethasone, and Manual therapy  PLAN FOR NEXT SESSION: STM to left lateral deltoid/ Biceps , PROM in flexion and ER shoulder ER. Consider wand flexion and cane chest press if tolerated. Consider Ionto or ultrasound to decrease acute inflammation    Lorayne Bender PT DPT  09/02/2022, 3:54 PM

## 2022-09-06 ENCOUNTER — Telehealth: Payer: Self-pay | Admitting: *Deleted

## 2022-09-06 DIAGNOSIS — D696 Thrombocytopenia, unspecified: Secondary | ICD-10-CM

## 2022-09-06 NOTE — Telephone Encounter (Signed)
Pt requested if labs could be obtained at her primary MD per she is scheduled with them same day as lab scheduled in this office.  This RN contacted the patient's primary MD's - Dr Mick Sell an was requested to fax orders.  Orders faxed and appt for lab here cancelled.

## 2022-09-06 NOTE — Telephone Encounter (Signed)
No entry 

## 2022-09-09 ENCOUNTER — Ambulatory Visit (HOSPITAL_BASED_OUTPATIENT_CLINIC_OR_DEPARTMENT_OTHER): Payer: Medicare Other | Admitting: Physical Therapy

## 2022-09-09 ENCOUNTER — Encounter (HOSPITAL_BASED_OUTPATIENT_CLINIC_OR_DEPARTMENT_OTHER): Payer: Self-pay | Admitting: Physical Therapy

## 2022-09-09 DIAGNOSIS — M6281 Muscle weakness (generalized): Secondary | ICD-10-CM

## 2022-09-09 DIAGNOSIS — M25512 Pain in left shoulder: Secondary | ICD-10-CM

## 2022-09-09 DIAGNOSIS — M5459 Other low back pain: Secondary | ICD-10-CM

## 2022-09-09 DIAGNOSIS — R262 Difficulty in walking, not elsewhere classified: Secondary | ICD-10-CM | POA: Diagnosis not present

## 2022-09-09 DIAGNOSIS — M25612 Stiffness of left shoulder, not elsewhere classified: Secondary | ICD-10-CM

## 2022-09-09 NOTE — Therapy (Signed)
OUTPATIENT PHYSICAL THERAPY SHOULDER TREATMENT   Patient Name: QUADIRAH HOLDEN MRN: [id number] DOB:1946-04-19, 77 y.o., female Today's Date: 09/09/2022  END OF SESSION:  PT End of Session - 09/09/22 1525     Visit Number 6    Number of Visits 12    Date for PT Re-Evaluation 09/25/22    Authorization Type [employer] MCR    PT Start Time 1525   pt arrived late   PT Stop Time 1555    PT Time Calculation (min) 30 min    Activity Tolerance Patient tolerated treatment well    Behavior During Therapy WFL for tasks assessed/performed                Past Medical History:  Diagnosis Date   Acute hypoxemic respiratory failure (HCC) 04/24/2017   Anxiety    Arthritis    "back" (04/24/2017)   Atherosclerosis of coronary artery    Blurry vision, left eye    "YAG on the left; to be repaired" (04/24/2017)   Chondromalacia of knee    right knee   Chronic bronchitis (HCC)    Chronic lower back pain    DDD (degenerative disc disease) 07/24/2011   Osteoarthritis-s/p LTKA 9'52, now right planned   Depression 07/24/2011   tx. depression only   DOE (dyspnea on exertion)    Fatty liver    History of diverticulitis 02/2018   Hx of adenomatous polyp of colon 03/21/2014   Hyperlipidemia    Hypertension    IBS (irritable bowel syndrome)    OSA on CPAP 12/07/2016   Pneumonia 1990s X 1   SOB (shortness of breath)    Vitamin D deficiency    Past Surgical History:  Procedure Laterality Date   APPENDECTOMY  1967   BOTOX INJECTION     "face"   CATARACT EXTRACTION W/ INTRAOCULAR LENS IMPLANT Bilateral 2000s   CESAREAN SECTION  1972; 1975   COLONOSCOPY W/ BIOPSIES AND POLYPECTOMY  "a few"   DILATION AND CURETTAGE OF UTERUS     HYSTEROSCOPY WITH D & C  08/29/2006   and resection of endometrial polyp/notes 09/10/2010   IR IMAGING GUIDED PORT INSERTION  01/18/2021   JOINT REPLACEMENT     KNEE ARTHROSCOPY Bilateral    NASAL SEPTUM SURGERY  1972   OOPHORECTOMY Right 1994   TOTAL KNEE ARTHROPLASTY   08/05/2011   Procedure: TOTAL KNEE ARTHROPLASTY;  Surgeon: Loanne Drilling, MD;  Location: [facility];  Service: Orthopedics;  Laterality: Right;   TOTAL KNEE ARTHROPLASTY Left 05/2009   TUBAL LIGATION  1975   Patient Active Problem List   Diagnosis Date Noted   Iron overload 06/20/2022   Dyspnea 03/25/2022   Port-A-Cath in place 08/27/2021   Macrocytosis without anemia 08/30/2020   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   Cough 05/14/2017   ETOH abuse 04/27/2017   Abnormal LFTs 04/26/2017   Thrombocytopenia (HCC) 04/26/2017   Dyspnea on exertion 03/18/2017   Morbid obesity (HCC) 03/18/2017   Obstructive sleep apnea 12/07/2016   Essential hypertension 12/07/2016   Hx of adenomatous polyp of colon 03/21/2014   Postop Hyponatremia 08/07/2011   OA (osteoarthritis) of knee 08/05/2011   Pain in limb 05/15/2011    PCP: [name], MD  REFERRING PROVIDER: [name], Md  REFERRING DIAG: [phone] (ICD-10-CM) - Pain in left shoulder   THERAPY DIAG:  Acute pain of left shoulder  Stiffness of left shoulder, not elsewhere classified  Muscle weakness (generalized)  Other low back pain  Difficulty in walking,  not elsewhere classified  Rationale for Evaluation and Treatment: Rehabilitation  ONSET DATE: January 2024  SUBJECTIVE:                                                                                                                                                                                      SUBJECTIVE STATEMENT: My shoulder is so much better.   Eval Patient is a 77 y/o women with left shoulder pain that started 3 months ago. Patient does not remember a MOI. Patient had a MRI done last month which reports a bicep tear. Patient is right hand dominant but sleeps on her left side. Patient reports that she is having trouble dressing. Patient reports pain and discomfort with over the head and behind the back reaching with left hand.   Hand dominance:  Right  PERTINENT HISTORY: DDD, chronic low back pain, shortness of breath  PAIN:  Sometimes it is sharp. Currently it is "aching"  PRECAUTIONS: Back  WEIGHT BEARING RESTRICTIONS: No  FALLS:  Has patient fallen in last 6 months? No  OCCUPATION: Retired  PLOF: Independent  PATIENT GOALS: Be able to dress without discomfort   OBJECTIVE:   POSTURE: Rounded shoulders / forward head   UPPER EXTREMITY ROM:   Active ROM Right eval Left eval  Shoulder flexion  110  Shoulder extension    Shoulder abduction  75  Shoulder adduction    Shoulder internal rotation    Shoulder external rotation    Elbow flexion    Elbow extension    Wrist flexion    Wrist extension    Wrist ulnar deviation    Wrist radial deviation    Wrist pronation    Wrist supination    (Blank rows = not tested)  UPPER EXTREMITY MMT:  MMT Right eval Left eval  Shoulder flexion    Shoulder extension    Shoulder abduction    Shoulder adduction    Shoulder internal rotation    Shoulder external rotation    Middle trapezius    Lower trapezius    Elbow flexion    Elbow extension    Wrist flexion    Wrist extension    Wrist ulnar deviation    Wrist radial deviation    Wrist pronation    Wrist supination    Grip strength (lbs)    (Blank rows = not tested) not tested 2nd to irritation    LUMBAR ROM:   Active  A/PROM  eval  Flexion   Extension   Right lateral flexion   Left lateral flexion   Right rotation   Left rotation    (Blank rows = not tested)  LOWER  EXTREMITY MMT:    MMT Right eval Left eval  Hip flexion 19.6 13.9  Hip extension    Hip abduction 23.2 22.2  Hip adduction    Hip internal rotation    Hip external rotation    Knee flexion    Knee extension 19.2 21.6  Ankle dorsiflexion    Ankle plantarflexion    Ankle inversion    Ankle eversion     (Blank rows = not tested)   PALPATION:  TTP anterior left deltoid   TODAY'S TREATMENT:                                                                                                                                          Treatment                            5/20:  Trigger Point Dry Needling, Manual Therapy Treatment:  Initial or subsequent education regarding Trigger Point Dry Needling: Subsequent Did patient give consent to treatment with Trigger Point Dry Needling: Yes TPDN with skilled palpation and monitoring followed by STM to the following muscles: Rt glut max proximal fibers lateral from Rt SIJ  Discussed aquatics [facility] IR behind back stretch & AROM for strength   5/16 Pulleys: 2 min flexion  Manual: trigger point release to upper trap; PROM into flexion and ER; Trigger point release to pec   Table slide for shoulder and back  Reviewed use of thera-cane for her lower back. Patient will require further cuing on use for desensitization.    5/13: PROM L shoulder Supine wand flexion x10 Scap squeezes 5" x15 Pulleys flexion x85min Bicep curl 2x10 Ball rolls at table flexion and scaption x10ea [place] flexion (partial) x10   PATIENT EDUCATION: Education details: POC, HEP, anatomy education, pain management  Person educated: Patient Education method: Explanation, Demonstration, Tactile cues, Verbal cues, and Handouts Education comprehension: verbalized understanding, returned demonstration, verbal cues required, tactile cues required, and needs further education  HOME EXERCISE PROGRAM: Access Code: [email or web address] URL: [email or web address]   ASSESSMENT:  CLINICAL IMPRESSION: Discussed progression to independent program and connected her with [employer] to discuss membership. At this time she is doing very well with shoulder and will not take long to build an appropriate aquatic HEP.    OBJECTIVE IMPAIRMENTS: decreased activity tolerance, decreased ROM, and decreased strength.   ACTIVITY LIMITATIONS: carrying, lifting, sleeping, bathing, dressing, reach over head, and  hygiene/grooming  PARTICIPATION LIMITATIONS: meal prep, cleaning, laundry, shopping, community activity, and yard work  PERSONAL FACTORS: 3+ comorbidities: chronic low back pain, DDD, shortness of breath, arthritis  are also affecting patient's functional outcome.   REHAB POTENTIAL: Good  CLINICAL DECISION MAKING: evolving /moderate increased pain with movement and pain causing increased difficulty with sleeping   EVALUATION COMPLEXITY: Moderate   GOALS: Goals reviewed with patient? Yes  SHORT TERM GOALS: Target date:  5/14  Patient will increase shoulder flexion and abduction by 10 degrees. Baseline: Goal status: achieved  2.  Patient will increase left UE strength by 5 lbs. Baseline:  Goal status: achieved  3.  Patient will be independent of HEP. Baseline:  Goal status: IN PROGRESS 5/13    LONG TERM GOALS: Target date: 6/4  Patient will be able to carry groceries with her left hand without any pain.  Baseline:  Goal status: INITIAL  2.  Patient will be able to reach behind her back and head without any significantly increase in pain in order to be able to dress.  Baseline:  Goal status: INITIAL  3.  Patient will return to ADL's without limitations or pain.   Baseline:  Goal status: INITIAL       PLAN:  PT FREQUENCY: 1-2x/week  PT DURATION: 8 weeks  PLANNED INTERVENTIONS: Therapeutic exercises, Therapeutic activity, Neuromuscular re-education, Balance training, Gait training, Patient/Family education, Self Care, Joint mobilization, Electrical stimulation, Cryotherapy, Moist heat, Taping, Ultrasound, Ionotophoresis 4mg /ml Dexamethasone, and Manual therapy  PLAN FOR NEXT SESSION: STM to left lateral deltoid/ Biceps , PROM in flexion and ER shoulder ER. Consider wand flexion and cane chest press if tolerated. Consider Ionto or ultrasound to decrease acute inflammation    Tavarus Poteete C. Sally-Ann Cutbirth PT, DPT 09/09/22 4:50 PM

## 2022-09-11 ENCOUNTER — Other Ambulatory Visit: Payer: Medicare Other

## 2022-09-11 DIAGNOSIS — L931 Subacute cutaneous lupus erythematosus: Secondary | ICD-10-CM | POA: Diagnosis not present

## 2022-09-11 DIAGNOSIS — E1169 Type 2 diabetes mellitus with other specified complication: Secondary | ICD-10-CM | POA: Diagnosis not present

## 2022-09-11 DIAGNOSIS — E119 Type 2 diabetes mellitus without complications: Secondary | ICD-10-CM | POA: Diagnosis not present

## 2022-09-12 ENCOUNTER — Other Ambulatory Visit: Payer: Medicare Other

## 2022-09-12 ENCOUNTER — Inpatient Hospital Stay: Payer: Medicare Other | Attending: Hematology and Oncology | Admitting: Hematology and Oncology

## 2022-09-12 ENCOUNTER — Encounter: Payer: Self-pay | Admitting: *Deleted

## 2022-09-12 DIAGNOSIS — R7401 Elevation of levels of liver transaminase levels: Secondary | ICD-10-CM | POA: Insufficient documentation

## 2022-09-12 DIAGNOSIS — Z87891 Personal history of nicotine dependence: Secondary | ICD-10-CM | POA: Diagnosis not present

## 2022-09-12 NOTE — Progress Notes (Signed)
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 female returns for follow-up of her history of iron overload.  She had her last phlebotomy in Jan 2024. Since her last visit she had some labs drawn at her primary care's office.  She otherwise feels well, denies any interim hospitalizations, infections or new medications.  Rest of the pertinent 10 point ROS reviewed and negative  Patient Active Problem List   Diagnosis Date Noted   Iron overload 06/20/2022   Dyspnea 03/25/2022   Port-A-Cath in place 08/27/2021   Macrocytosis without anemia 08/30/2020   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   Cough 05/14/2017   ETOH abuse 04/27/2017   Abnormal LFTs 04/26/2017   Thrombocytopenia (HCC) 04/26/2017   Dyspnea on exertion 03/18/2017   Morbid obesity (HCC) 03/18/2017   Obstructive sleep apnea 12/07/2016   Essential hypertension 12/07/2016   Hx of adenomatous polyp of colon 03/21/2014   Postop Hyponatremia 08/07/2011   OA (osteoarthritis) of knee 08/05/2011   Pain in limb 05/15/2011    has No Known Allergies.  MEDICAL HISTORY: Past Medical History:  Diagnosis Date   Acute hypoxemic respiratory failure (HCC) 04/24/2017   Anxiety    Arthritis    "back" (04/24/2017)   Atherosclerosis of coronary artery    Blurry vision, left eye    "YAG on the left; to be repaired" (04/24/2017)   Chondromalacia of knee    right knee   Chronic bronchitis (HCC)     Chronic lower back pain    DDD (degenerative disc disease) 07/24/2011   Osteoarthritis-s/p LTKA 5'28, now right planned   Depression 07/24/2011   tx. depression only   DOE (dyspnea on exertion)    Fatty liver    History of diverticulitis 02/2018   Hx of adenomatous polyp of colon 03/21/2014   Hyperlipidemia    Hypertension    IBS (irritable bowel syndrome)    OSA on CPAP 12/07/2016   Pneumonia 1990s X 1   SOB (shortness of breath)    Vitamin D deficiency     SURGICAL HISTORY: Past Surgical History:  Procedure Laterality Date   APPENDECTOMY  1967   BOTOX INJECTION     "face"   CATARACT EXTRACTION W/ INTRAOCULAR LENS IMPLANT Bilateral 2000s   CESAREAN SECTION  1972; 1975   COLONOSCOPY W/ BIOPSIES AND POLYPECTOMY  "a few"   DILATION AND CURETTAGE OF UTERUS     HYSTEROSCOPY WITH D & C  08/29/2006   and resection of endometrial polyp/notes 09/10/2010   IR IMAGING GUIDED PORT INSERTION  01/18/2021   JOINT REPLACEMENT     KNEE ARTHROSCOPY Bilateral    NASAL SEPTUM SURGERY  1972   OOPHORECTOMY Right 1994   TOTAL KNEE ARTHROPLASTY  08/05/2011   Procedure: TOTAL KNEE ARTHROPLASTY;  Surgeon: Loanne Drilling, MD;  Location: [facility];  Service: Orthopedics;  Laterality: Right;   TOTAL KNEE ARTHROPLASTY Left 05/2009   TUBAL LIGATION  1975    SOCIAL HISTORY: Social History   Socioeconomic History   Marital status: Divorced  Spouse name: Not on file   Number of children: 2   Years of education: Not on file   Highest education level: Not on file  Occupational History   Occupation: Retired Haematologist  Tobacco Use   Smoking status: Former    Packs/day: 0.12    Years: 36.00    Additional pack years: 0.00    Total pack years: 4.32    Types: Cigarettes    Quit date: 06/19/2014    Years since quitting: 8.2   Smokeless tobacco: Never  Vaping Use   Vaping Use: Never used  Substance and Sexual Activity   Alcohol use: Yes    Alcohol/week: 21.0 standard drinks of alcohol    Types:  21 Glasses of wine per week    Comment: 04/24/2017 "3, glasses of white wine qd"   Drug use: No   Sexual activity: Never  Other Topics Concern   Not on file  Social History Narrative   Not on file   Social Determinants of Health   Financial Resource Strain: Not on file  Food Insecurity: Not on file  Transportation Needs: Not on file  Physical Activity: Not on file  Stress: Not on file  Social Connections: Not on file  Intimate Partner Violence: Not on file    FAMILY HISTORY: Family History  Problem Relation Age of Onset   Heart disease Mother        Rheumatic heart disease   Pulmonary embolism Mother    Heart disease Father        valvular heart disease   Stroke Father    Hypertension Sister    Colon cancer Neg Hx    Esophageal cancer Neg Hx    Rectal cancer Neg Hx    Stomach cancer Neg Hx    Pancreatic cancer Neg Hx     Review of Systems  Constitutional:  Negative for appetite change, chills, fatigue, fever and unexpected weight change.  HENT:   Negative for hearing loss, lump/mass and trouble swallowing.   Eyes:  Negative for eye problems and icterus.  Respiratory:  Negative for chest tightness, cough and shortness of breath.   Cardiovascular:  Negative for chest pain, leg swelling and palpitations.  Gastrointestinal:  Negative for abdominal distention, abdominal pain, constipation, diarrhea, nausea and vomiting.  Endocrine: Negative for hot flashes.  Genitourinary:  Negative for difficulty urinating.   Musculoskeletal:  Negative for arthralgias.  Skin:  Negative for itching and rash.  Neurological:  Negative for dizziness, extremity weakness, headaches and numbness.  Hematological:  Negative for adenopathy. Does not bruise/bleed easily.  Psychiatric/Behavioral:  Negative for depression. The patient is not nervous/anxious.       PHYSICAL EXAMINATION  ECOG PERFORMANCE STATUS: 0 - Asymptomatic  Vitals:   09/12/22 1323  BP: 116/71  Pulse: 81  Resp: 17   Temp: (!) 97.3 F (36.3 C)  SpO2: 93%    Physical Exam Constitutional:      General: She is not in acute distress.    Appearance: Normal appearance. She is not toxic-appearing.  HENT:     Head: Normocephalic and atraumatic.  Eyes:     General: No scleral icterus. Cardiovascular:     Rate and Rhythm: Normal rate and regular rhythm.     Pulses: Normal pulses.     Heart sounds: Normal heart sounds.  Pulmonary:     Effort: Pulmonary effort is normal.     Breath sounds: Normal breath sounds.  Abdominal:     General: Abdomen is  flat. Bowel sounds are normal. There is no distension.     Palpations: Abdomen is soft.     Tenderness: There is no abdominal tenderness.  Musculoskeletal:        General: No swelling.     Cervical back: Neck supple.  Lymphadenopathy:     Cervical: No cervical adenopathy.  Skin:    General: Skin is warm and dry.     Findings: No rash.  Neurological:     General: No focal deficit present.     Mental Status: She is alert.  Psychiatric:        Mood and Affect: Mood normal.        Behavior: Behavior normal.     LABORATORY DATA:  CBC    Component Value Date/Time   WBC 5.7 06/11/2022 1416   RBC 4.00 06/11/2022 1416   HGB 14.1 06/11/2022 1416   HGB 14.5 03/11/2022 1529   HGB 13.7 10/04/2021 1424   HCT 39.7 06/11/2022 1416   HCT 40.4 10/04/2021 1424   PLT 181 06/11/2022 1416   PLT 174 03/11/2022 1529   MCV 99.3 06/11/2022 1416   MCV 99 (H) 02/18/2018 1217   MCH 35.3 (H) 06/11/2022 1416   MCHC 35.5 06/11/2022 1416   RDW 11.9 06/11/2022 1416   RDW 12.1 (L) 02/18/2018 1217   LYMPHSABS 1.5 06/11/2022 1416   LYMPHSABS 1.7 02/18/2018 1217   MONOABS 0.7 06/11/2022 1416   EOSABS 0.2 06/11/2022 1416   EOSABS 0.2 02/18/2018 1217   BASOSABS 0.1 06/11/2022 1416   BASOSABS 0.1 02/18/2018 1217    CMP     Component Value Date/Time   NA 140 06/11/2022 1416   NA 138 10/04/2021 1424   K 4.2 06/11/2022 1416   CL 107 06/11/2022 1416   CO2 27  06/11/2022 1416   GLUCOSE 118 (H) 06/11/2022 1416   BUN 18 06/11/2022 1416   BUN 21 10/04/2021 1424   CREATININE 0.99 06/11/2022 1416   CREATININE 0.98 10/03/2020 1301   CALCIUM 9.0 06/11/2022 1416   PROT 6.8 06/11/2022 1416   PROT 7.0 02/18/2018 1217   ALBUMIN 4.0 06/11/2022 1416   ALBUMIN 4.4 02/18/2018 1217   AST 31 06/11/2022 1416   AST 28 10/03/2020 1301   ALT 37 06/11/2022 1416   ALT 49 (H) 10/03/2020 1301   ALKPHOS 62 06/11/2022 1416   BILITOT 0.8 06/11/2022 1416   BILITOT 0.5 10/03/2020 1301   GFRNONAA 59 (L) 06/11/2022 1416   GFRNONAA >60 10/03/2020 1301   GFRAA >60 08/09/2018 1835        ASSESSMENT and THERAPY PLAN:   Hereditary hemochromatosis (HCC) This is a very pleasant 77 year old with heterozygous C26 Y, baseline ferritin of 1100 and some transaminitis at diagnosis referred to hematology for further recommendations. She last required phlebotomy in January.  Her most recent labs from her primary care physician's office showed a ferritin over 100 hence we will arrange for another phlebotomy.  On average she will likely need phlebotomy 2-3 times a year to maintain a ferritin iron 100.  She once again had questions about the hemochromatosis which I explained in detail.  Rest of the physical examination without any concerns.  She expressed understanding of all the recommendations.  She will return to clinic in 3 to 4 months with repeat labs.  Thank you for consulting [place] in the care of this patient.  Please do not hesitate to contact us with any additional questions or concerns.   All questions were answered. The patient  knows to call the clinic with any problems, questions or concerns. We can certainly see the patient much sooner if necessary.  Total encounter time:30 minutes*in face-to-face visit time, chart review, lab review, care coordination, order entry, and documentation of the encounter time.  *Total Encounter Time as defined by the Centers for Medicare and  Medicaid Services includes, in addition to the face-to-face time of a patient visit (documented in the note above) non-face-to-face time: obtaining and reviewing outside history, ordering and reviewing medications, tests or procedures, care coordination (communications with other health care professionals or caregivers) and documentation in the medical record.

## 2022-09-13 ENCOUNTER — Encounter: Payer: Self-pay | Admitting: Hematology and Oncology

## 2022-09-13 NOTE — Assessment & Plan Note (Signed)
This is a very pleasant 77 year old with heterozygous C11 Y, baseline ferritin of 1100 and some transaminitis at diagnosis referred to hematology for further recommendations. She last required phlebotomy in January.  Her most recent labs from her primary care physician's office showed a ferritin over 100 hence we will arrange for another phlebotomy.  On average she will likely need phlebotomy 2-3 times a year to maintain a ferritin iron 100.  She once again had questions about the hemochromatosis which I explained in detail.  Rest of the physical examination without any concerns.  She expressed understanding of all the recommendations.  She will return to clinic in 3 to 4 months with repeat labs.  Thank you for consulting [place] in the care of this patient.  Please do not hesitate to contact us with any additional questions or concerns.

## 2022-09-18 ENCOUNTER — Encounter (HOSPITAL_BASED_OUTPATIENT_CLINIC_OR_DEPARTMENT_OTHER): Payer: Self-pay | Admitting: Physical Therapy

## 2022-09-18 ENCOUNTER — Other Ambulatory Visit (HOSPITAL_COMMUNITY): Payer: Self-pay

## 2022-09-18 ENCOUNTER — Ambulatory Visit (HOSPITAL_BASED_OUTPATIENT_CLINIC_OR_DEPARTMENT_OTHER): Payer: Medicare Other | Admitting: Physical Therapy

## 2022-09-18 ENCOUNTER — Other Ambulatory Visit (HOSPITAL_BASED_OUTPATIENT_CLINIC_OR_DEPARTMENT_OTHER): Payer: Self-pay

## 2022-09-18 DIAGNOSIS — M25612 Stiffness of left shoulder, not elsewhere classified: Secondary | ICD-10-CM | POA: Diagnosis not present

## 2022-09-18 DIAGNOSIS — M25512 Pain in left shoulder: Secondary | ICD-10-CM

## 2022-09-18 DIAGNOSIS — M6281 Muscle weakness (generalized): Secondary | ICD-10-CM

## 2022-09-18 DIAGNOSIS — R262 Difficulty in walking, not elsewhere classified: Secondary | ICD-10-CM | POA: Diagnosis not present

## 2022-09-18 DIAGNOSIS — M5459 Other low back pain: Secondary | ICD-10-CM | POA: Diagnosis not present

## 2022-09-18 NOTE — Therapy (Signed)
OUTPATIENT PHYSICAL THERAPY SHOULDER TREATMENT   Patient Name: TING FRISCHMAN MRN: [id number] DOB:09-Jan-1946, 77 y.o., female Today's Date: 09/09/2022  END OF SESSION:  PT End of Session - 09/09/22 1525     Visit Number 6    Number of Visits 12    Date for PT Re-Evaluation 09/25/22    Authorization Type BCBS MCR    PT Start Time 1525   pt arrived late   PT Stop Time 1555    PT Time Calculation (min) 30 min    Activity Tolerance Patient tolerated treatment well    Behavior During Therapy WFL for tasks assessed/performed                Past Medical History:  Diagnosis Date   Acute hypoxemic respiratory failure (HCC) 04/24/2017   Anxiety    Arthritis    "back" (04/24/2017)   Atherosclerosis of coronary artery    Blurry vision, left eye    "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 depression only   DOE (dyspnea on exertion)    Fatty liver    History of diverticulitis 02/2018   Hx of adenomatous polyp of colon 03/21/2014   Hyperlipidemia    Hypertension    IBS (irritable bowel syndrome)    OSA on CPAP 12/07/2016   Pneumonia 1990s X 1   SOB (shortness of breath)    Vitamin D deficiency    Past Surgical History:  Procedure Laterality Date   APPENDECTOMY  1967   BOTOX INJECTION     "face"   CATARACT EXTRACTION W/ INTRAOCULAR LENS IMPLANT Bilateral 2000s   CESAREAN SECTION  1972; 1975   COLONOSCOPY W/ BIOPSIES AND POLYPECTOMY  "a few"   DILATION AND CURETTAGE OF UTERUS     HYSTEROSCOPY WITH D & C  08/29/2006   and resection of endometrial polyp/notes 09/10/2010   IR IMAGING GUIDED PORT INSERTION  01/18/2021   JOINT REPLACEMENT     KNEE ARTHROSCOPY Bilateral    NASAL SEPTUM SURGERY  1972   OOPHORECTOMY Right 1994   TOTAL KNEE ARTHROPLASTY   08/05/2011   Procedure: TOTAL KNEE ARTHROPLASTY;  Surgeon: Loanne Drilling, MD;  Location: [facility];  Service: Orthopedics;  Laterality: Right;   TOTAL KNEE ARTHROPLASTY Left 05/2009   TUBAL LIGATION  1975   Patient Active Problem List   Diagnosis Date Noted   Iron overload 06/20/2022   Dyspnea 03/25/2022   Port-A-Cath in place 08/27/2021   Macrocytosis without anemia 08/30/2020   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   Cough 05/14/2017   ETOH abuse 04/27/2017   Abnormal LFTs 04/26/2017   Thrombocytopenia (HCC) 04/26/2017   Dyspnea on exertion 03/18/2017   Morbid obesity (HCC) 03/18/2017   Obstructive sleep apnea 12/07/2016   Essential hypertension 12/07/2016   Hx of adenomatous polyp of colon 03/21/2014   Postop Hyponatremia 08/07/2011   OA (osteoarthritis) of knee 08/05/2011   Pain in limb 05/15/2011    PCP: [name], MD  REFERRING PROVIDER: [name], Md  REFERRING DIAG: [phone] (ICD-10-CM) - Pain in left shoulder   THERAPY DIAG:  Acute pain of left shoulder  Stiffness of left shoulder, not elsewhere classified  Muscle weakness (generalized)  Other low back pain  Difficulty in walking,  not elsewhere classified  Rationale for Evaluation and Treatment: Rehabilitation  ONSET DATE: January 2024  SUBJECTIVE:                                                                                                                                                                                      SUBJECTIVE STATEMENT: The patient continues to report improvement in her shoulder. Her back is a little sore today but that improved too. She will be joining the gym.  Eval Patient is a 77 y/o women with left shoulder pain that started 3 months ago. Patient does not remember a MOI. Patient had a MRI done last month which reports a bicep tear. Patient is right hand dominant but sleeps on her left side. Patient reports that she is having trouble dressing. Patient reports  pain and discomfort with over the head and behind the back reaching with left hand.   Hand dominance: Right  PERTINENT HISTORY: DDD, chronic low back pain, shortness of breath  PAIN:  Sometimes it is sharp. Currently it is "aching"  PRECAUTIONS: Back  WEIGHT BEARING RESTRICTIONS: No  FALLS:  Has patient fallen in last 6 months? No  OCCUPATION: Retired  PLOF: Independent  PATIENT GOALS: Be able to dress without discomfort   OBJECTIVE:   POSTURE: Rounded shoulders / forward head   UPPER EXTREMITY ROM:   Active ROM Right eval Left eval  Shoulder flexion  110  Shoulder extension    Shoulder abduction  75  Shoulder adduction    Shoulder internal rotation    Shoulder external rotation    Elbow flexion    Elbow extension    Wrist flexion    Wrist extension    Wrist ulnar deviation    Wrist radial deviation    Wrist pronation    Wrist supination    (Blank rows = not tested)  UPPER EXTREMITY MMT:  MMT Right eval Left eval  Shoulder flexion    Shoulder extension    Shoulder abduction    Shoulder adduction    Shoulder internal rotation    Shoulder external rotation    Middle trapezius    Lower trapezius    Elbow flexion    Elbow extension    Wrist flexion    Wrist extension    Wrist ulnar deviation    Wrist radial deviation    Wrist pronation    Wrist supination    Grip strength (lbs)    (Blank rows = not tested) not tested 2nd to irritation    LUMBAR ROM:   Active  A/PROM  eval  Flexion   Extension   Right lateral flexion   Left lateral  flexion   Right rotation   Left rotation    (Blank rows = not tested)  LOWER EXTREMITY MMT:    MMT Right eval Left eval  Hip flexion 19.6 13.9  Hip extension    Hip abduction 23.2 22.2  Hip adduction    Hip internal rotation    Hip external rotation    Knee flexion    Knee extension 19.2 21.6  Ankle dorsiflexion    Ankle plantarflexion    Ankle inversion    Ankle eversion     (Blank rows =  not tested)   PALPATION:  TTP anterior left deltoid   TODAY'S TREATMENT:                                                                                                                                         5/29 LF row  10 lbs   Free weight punch 1lbs  Free weight bicpes 1lbs  Cybex leg press 50 lbs Seat back 4 Seat 5  LF knee extension  10 lbs  Nu-step L3 5 min    Ball roll forward 5x 10 sec hold with manual trigger point release.  Therapy reviewed use of x-fit but patient had difficulty getting fit properly  Reviewed RPE sheet and how to use in order to greater exercise weight sets and reps properly        Treatment                            5/20:  Trigger Point Dry Needling, Manual Therapy Treatment:  Initial or subsequent education regarding Trigger Point Dry Needling: Subsequent Did patient give consent to treatment with Trigger Point Dry Needling: Yes TPDN with skilled palpation and monitoring followed by STM to the following muscles: Rt glut max proximal fibers lateral from Rt SIJ  Discussed aquatics GHJ IR behind back stretch & AROM for strength   5/16 Pulleys: 2 min flexion  Manual: trigger point release to upper trap; PROM into flexion and ER; Trigger point release to pec   Table slide for shoulder and back  Reviewed use of thera-cane for her lower back. Patient will require further cuing on use for desensitization.    5/13: PROM L shoulder Supine wand flexion x10 Scap squeezes 5" x15 Pulleys flexion x27min Bicep curl 2x10 Ball rolls at table flexion and scaption x10ea [place] flexion (partial) x10   PATIENT EDUCATION: Education details: POC, HEP, anatomy education, pain management  Person educated: Patient Education method: Explanation, Demonstration, Tactile cues, Verbal cues, and Handouts Education comprehension: verbalized understanding, returned demonstration, verbal cues required, tactile cues required, and needs further education  HOME  EXERCISE PROGRAM: Access Code: [email or web address] URL: [email or web address]   ASSESSMENT:  CLINICAL IMPRESSION: Patient continues to make excellent progress.  We reviewed exercises in the gym.  She tolerated well.  She will have her gym assessment  on Saturday.  We reviewed equipment that she can use and equipment that she may want to avoid.  She continues to have a large trigger point in her right upper gluteal/lower lumbar paraspinals.  We reviewed self stretching and self soft tissue mobilization.  She is using her Thera cane on a regular basis.  She will have her first pool session on Monday.  Therapy will continue to progress as tolerated.   OBJECTIVE IMPAIRMENTS: decreased activity tolerance, decreased ROM, and decreased strength.   ACTIVITY LIMITATIONS: carrying, lifting, sleeping, bathing, dressing, reach over head, and hygiene/grooming  PARTICIPATION LIMITATIONS: meal prep, cleaning, laundry, shopping, community activity, and yard work  PERSONAL FACTORS: 3+ comorbidities: chronic low back pain, DDD, shortness of breath, arthritis  are also affecting patient's functional outcome.   REHAB POTENTIAL: Good  CLINICAL DECISION MAKING: evolving /moderate increased pain with movement and pain causing increased difficulty with sleeping   EVALUATION COMPLEXITY: Moderate   GOALS: Goals reviewed with patient? Yes  SHORT TERM GOALS: Target date: 5/14  Patient will increase shoulder flexion and abduction by 10 degrees. Baseline: Goal status: achieved  2.  Patient will increase left UE strength by 5 lbs. Baseline:  Goal status: achieved  3.  Patient will be independent of HEP. Baseline:  Goal status: IN PROGRESS 5/13    LONG TERM GOALS: Target date: 6/4  Patient will be able to carry groceries with her left hand without any pain.  Baseline:  Goal status: INITIAL  2.  Patient will be able to reach behind her back and head without any significantly increase in pain in  order to be able to dress.  Baseline:  Goal status: INITIAL  3.  Patient will return to ADL's without limitations or pain.   Baseline:  Goal status: INITIAL       PLAN:  PT FREQUENCY: 1-2x/week  PT DURATION: 8 weeks  PLANNED INTERVENTIONS: Therapeutic exercises, Therapeutic activity, Neuromuscular re-education, Balance training, Gait training, Patient/Family education, Self Care, Joint mobilization, Electrical stimulation, Cryotherapy, Moist heat, Taping, Ultrasound, Ionotophoresis 4mg /ml Dexamethasone, and Manual therapy  PLAN FOR NEXT SESSION: STM to left lateral deltoid/ Biceps , PROM in flexion and ER shoulder ER. Consider wand flexion and cane chest press if tolerated. Consider Ionto or ultrasound to decrease acute inflammation    Lorayne Bender PT, DPT 09/09/22 4:50 PM

## 2022-09-19 ENCOUNTER — Encounter (HOSPITAL_BASED_OUTPATIENT_CLINIC_OR_DEPARTMENT_OTHER): Payer: Self-pay | Admitting: Physical Therapy

## 2022-09-20 ENCOUNTER — Other Ambulatory Visit (HOSPITAL_COMMUNITY): Payer: Self-pay

## 2022-09-23 ENCOUNTER — Inpatient Hospital Stay: Payer: Medicare Other

## 2022-09-23 ENCOUNTER — Inpatient Hospital Stay: Payer: Medicare Other | Attending: Hematology and Oncology

## 2022-09-23 ENCOUNTER — Other Ambulatory Visit (HOSPITAL_COMMUNITY): Payer: Self-pay

## 2022-09-23 DIAGNOSIS — Z95828 Presence of other vascular implants and grafts: Secondary | ICD-10-CM

## 2022-09-23 LAB — COMPREHENSIVE METABOLIC PANEL
ALT: 26 U/L (ref 0–44)
AST: 23 U/L (ref 15–41)
Albumin: 3.9 g/dL (ref 3.5–5.0)
Alkaline Phosphatase: 60 U/L (ref 38–126)
Anion gap: 9 (ref 5–15)
BUN: 21 mg/dL (ref 8–23)
CO2: 24 mmol/L (ref 22–32)
Calcium: 9.1 mg/dL (ref 8.9–10.3)
Chloride: 106 mmol/L (ref 98–111)
Creatinine, Ser: 1.01 mg/dL — ABNORMAL HIGH (ref 0.44–1.00)
GFR, Estimated: 58 mL/min — ABNORMAL LOW (ref >60)
Glucose, Bld: 117 mg/dL — ABNORMAL HIGH (ref 70–99)
Potassium: 3.9 mmol/L (ref 3.5–5.1)
Sodium: 139 mmol/L (ref 135–145)
Total Bilirubin: 0.8 mg/dL (ref 0.3–1.2)
Total Protein: 6.9 g/dL (ref 6.5–8.1)

## 2022-09-23 LAB — CBC WITH DIFFERENTIAL/PLATELET
Abs Immature Granulocytes: 0.01 10*3/uL (ref 0.00–0.07)
Basophils Absolute: 0.1 10*3/uL (ref 0.0–0.1)
Basophils Relative: 1 %
Eosinophils Absolute: 0.1 10*3/uL (ref 0.0–0.5)
Eosinophils Relative: 3 %
HCT: 37.9 % (ref 36.0–46.0)
Hemoglobin: 14.1 g/dL (ref 12.0–15.0)
Immature Granulocytes: 0 %
Lymphocytes Relative: 35 %
Lymphs Abs: 1.5 10*3/uL (ref 0.7–4.0)
MCH: 35.5 pg — ABNORMAL HIGH (ref 26.0–34.0)
MCHC: 37.2 g/dL — ABNORMAL HIGH (ref 30.0–36.0)
MCV: 95.5 fL (ref 80.0–100.0)
Monocytes Absolute: 0.6 10*3/uL (ref 0.1–1.0)
Monocytes Relative: 14 %
Neutro Abs: 2 10*3/uL (ref 1.7–7.7)
Neutrophils Relative %: 47 %
Platelets: 167 10*3/uL (ref 150–400)
RBC: 3.97 MIL/uL (ref 3.87–5.11)
RDW: 12 % (ref 11.5–15.5)
WBC: 4.3 10*3/uL (ref 4.0–10.5)
nRBC: 0 % (ref 0.0–0.2)

## 2022-09-23 LAB — FERRITIN: Ferritin: 131 ng/mL (ref 11–307)

## 2022-09-23 MED ORDER — OZEMPIC (2 MG/DOSE) 8 MG/3ML ~~LOC~~ SOPN
2.0000 mg | PEN_INJECTOR | SUBCUTANEOUS | 5 refills | Status: DC
  Filled 2022-09-23: qty 3, 28d supply, fill #0
  Filled 2022-10-17: qty 3, 28d supply, fill #1
  Filled 2022-11-13: qty 3, 28d supply, fill #2
  Filled 2022-12-12: qty 3, 28d supply, fill #3
  Filled 2023-01-09: qty 3, 28d supply, fill #4
  Filled 2023-04-03: qty 3, 28d supply, fill #5

## 2022-09-23 MED ORDER — HEPARIN SOD (PORK) LOCK FLUSH 100 UNIT/ML IV SOLN
500.0000 [IU] | Freq: Once | INTRAVENOUS | Status: AC
  Administered 2022-09-23: 500 [IU]

## 2022-09-23 MED ORDER — SODIUM CHLORIDE 0.9% FLUSH
10.0000 mL | Freq: Once | INTRAVENOUS | Status: AC
  Administered 2022-09-23: 10 mL

## 2022-09-23 NOTE — Progress Notes (Signed)
Per infusion RN:  "Patient's PAC is not drawing blood and she is refusing a traditional phlebotomy until she has her PAC cath-flo'd. She does not have time to do that today and is rescheduling a flush with potential cath-flo in order to determine if we can use it a different time."

## 2022-09-23 NOTE — Progress Notes (Signed)
Per Dr. Al Pimple- going ahead with therapeutic phlebotomy- hemoglobin is 14.1. Patient VS are good. BP (!) 147/85 (BP Location: Left Wrist, Patient Position: Sitting)   Pulse 67   Temp 97.6 F (36.4 C) (Oral)   Resp 16   SpO2 93%   Patient PAC does not draw blood today. Patient does not want to try the phlebotomy traditionally through a peripheral access and does not have time for cath-flo today. Rescheduling for a port flush with cath-flo in order to determine if her PAC is working properly and available for a phlebotomy.  Dr. Al Pimple informed of patient decision.

## 2022-09-24 ENCOUNTER — Encounter (HOSPITAL_BASED_OUTPATIENT_CLINIC_OR_DEPARTMENT_OTHER): Payer: Self-pay | Admitting: Physical Therapy

## 2022-09-24 ENCOUNTER — Ambulatory Visit (HOSPITAL_BASED_OUTPATIENT_CLINIC_OR_DEPARTMENT_OTHER): Payer: Medicare Other | Attending: Orthopedic Surgery | Admitting: Physical Therapy

## 2022-09-24 ENCOUNTER — Other Ambulatory Visit (HOSPITAL_COMMUNITY): Payer: Self-pay

## 2022-09-24 DIAGNOSIS — M5459 Other low back pain: Secondary | ICD-10-CM | POA: Diagnosis not present

## 2022-09-24 DIAGNOSIS — R262 Difficulty in walking, not elsewhere classified: Secondary | ICD-10-CM | POA: Diagnosis not present

## 2022-09-24 DIAGNOSIS — M25512 Pain in left shoulder: Secondary | ICD-10-CM | POA: Insufficient documentation

## 2022-09-24 DIAGNOSIS — M25612 Stiffness of left shoulder, not elsewhere classified: Secondary | ICD-10-CM | POA: Insufficient documentation

## 2022-09-24 DIAGNOSIS — M6281 Muscle weakness (generalized): Secondary | ICD-10-CM | POA: Insufficient documentation

## 2022-09-24 NOTE — Therapy (Signed)
OUTPATIENT PHYSICAL THERAPY SHOULDER TREATMENT   Patient Name: Michelle [name] MRN: [id number] DOB:February 19, 1946, 77 y.o., female Today's Date: 09/24/2022  END OF SESSION:  PT End of Session - 09/24/22 1702     Visit Number 8    Number of Visits 12    Date for PT Re-Evaluation 09/25/22    Authorization Type BCBS MCR    PT Start Time 1701    PT Stop Time 1745    PT Time Calculation (min) 44 min    Activity Tolerance Patient tolerated treatment well    Behavior During Therapy WFL for tasks assessed/performed                Past Medical History:  Diagnosis Date   Acute hypoxemic respiratory failure (HCC) 04/24/2017   Anxiety    Arthritis    "back" (04/24/2017)   Atherosclerosis of coronary artery    Blurry vision, left eye    "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 depression only   DOE (dyspnea on exertion)    Fatty liver    History of diverticulitis 02/2018   Hx of adenomatous polyp of colon 03/21/2014   Hyperlipidemia    Hypertension    IBS (irritable bowel syndrome)    OSA on CPAP 12/07/2016   Pneumonia 1990s X 1   SOB (shortness of breath)    Vitamin D deficiency    Past Surgical History:  Procedure Laterality Date   APPENDECTOMY  1967   BOTOX INJECTION     "face"   CATARACT EXTRACTION W/ INTRAOCULAR LENS IMPLANT Bilateral 2000s   CESAREAN SECTION  1972; 1975   COLONOSCOPY W/ BIOPSIES AND POLYPECTOMY  "a few"   DILATION AND CURETTAGE OF UTERUS     HYSTEROSCOPY WITH D & C  08/29/2006   and resection of endometrial polyp/notes 09/10/2010   IR IMAGING GUIDED PORT INSERTION  01/18/2021   JOINT REPLACEMENT     KNEE ARTHROSCOPY Bilateral    NASAL SEPTUM SURGERY  1972   OOPHORECTOMY Right 1994   TOTAL KNEE ARTHROPLASTY  08/05/2011    Procedure: TOTAL KNEE ARTHROPLASTY;  Surgeon: Loanne Drilling, MD;  Location: [facility];  Service: Orthopedics;  Laterality: Right;   TOTAL KNEE ARTHROPLASTY Left 05/2009   TUBAL LIGATION  1975   Patient Active Problem List   Diagnosis Date Noted   Iron overload 06/20/2022   Dyspnea 03/25/2022   Port-A-Cath in place 08/27/2021   Macrocytosis without anemia 08/30/2020   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   Cough 05/14/2017   ETOH abuse 04/27/2017   Abnormal LFTs 04/26/2017   Thrombocytopenia (HCC) 04/26/2017   Dyspnea on exertion 03/18/2017   Morbid obesity (HCC) 03/18/2017   Obstructive sleep apnea 12/07/2016   Essential hypertension 12/07/2016   Hx of adenomatous polyp of colon 03/21/2014   Postop Hyponatremia 08/07/2011   OA (osteoarthritis) of knee 08/05/2011   Pain in limb 05/15/2011    PCP: [name], MD  REFERRING PROVIDER: [name], Md  REFERRING DIAG: [phone] (ICD-10-CM) - Pain in left shoulder   THERAPY DIAG:  Muscle weakness (generalized)  Other low back pain  Difficulty in walking, not elsewhere classified  Rationale for Evaluation and Treatment: Rehabilitation  ONSET DATE: January 2024  SUBJECTIVE:  SUBJECTIVE STATEMENT: "Shoulder is better.  LB a some soreness. My back felt really good whenI was coming a few months ago.  I did join [facility] to have access to pool" Eval Patient is a 77 y/o women with left shoulder pain that started 3 months ago. Patient does not remember a MOI. Patient had a MRI done last month which reports a bicep tear. Patient is right hand dominant but sleeps on her left side. Patient reports that she is having trouble dressing. Patient reports pain and discomfort with over the head and behind the back reaching with left hand.   Hand  dominance: Right  PERTINENT HISTORY: DDD, chronic low back pain, shortness of breath  PAIN:  Sometimes it is sharp. Currently it is "aching"  LB: 5/10  PRECAUTIONS: Back  WEIGHT BEARING RESTRICTIONS: No  FALLS:  Has patient fallen in last 6 months? No  OCCUPATION: Retired  PLOF: Independent  PATIENT GOALS: Be able to dress without discomfort   OBJECTIVE:   POSTURE: Rounded shoulders / forward head   UPPER EXTREMITY ROM:   Active ROM Right eval Left eval  Shoulder flexion  110  Shoulder extension    Shoulder abduction  75  Shoulder adduction    Shoulder internal rotation    Shoulder external rotation    Elbow flexion    Elbow extension    Wrist flexion    Wrist extension    Wrist ulnar deviation    Wrist radial deviation    Wrist pronation    Wrist supination    (Blank rows = not tested)  UPPER EXTREMITY MMT:  MMT Right eval Left eval  Shoulder flexion    Shoulder extension    Shoulder abduction    Shoulder adduction    Shoulder internal rotation    Shoulder external rotation    Middle trapezius    Lower trapezius    Elbow flexion    Elbow extension    Wrist flexion    Wrist extension    Wrist ulnar deviation    Wrist radial deviation    Wrist pronation    Wrist supination    Grip strength (lbs)    (Blank rows = not tested) not tested 2nd to irritation    LUMBAR ROM:   Active  A/PROM  eval  Flexion   Extension   Right lateral flexion   Left lateral flexion   Right rotation   Left rotation    (Blank rows = not tested)  LOWER EXTREMITY MMT:    MMT Right eval Left eval  Hip flexion 19.6 13.9  Hip extension    Hip abduction 23.2 22.2  Hip adduction    Hip internal rotation    Hip external rotation    Knee flexion    Knee extension 19.2 21.6  Ankle dorsiflexion    Ankle plantarflexion    Ankle inversion    Ankle eversion     (Blank rows = not tested)   PALPATION:  TTP anterior left deltoid   TODAY'S TREATMENT:  09/24/22  Pt seen for aquatic therapy today.  Treatment took place in water 3.5-4.75 ft in depth at the [facility] pool. Temp of water was 91.  Pt entered/exited the pool via stairs with hand rail.   *walking forward back and side stepping ue support barbell then unsupported *HB (rainbow) carry forward, back and side stepping for core engagement *Standing hamstring and gastroc stretch at step - piriformis stretch - adductor stretch - TL stretch *TrA sets: solid noodle pull down wide stance then staggered x 10 reps ea *Lumbar rotation *Core engagement: KB row staggered stance ; wide stance x10 slow x 10 fast *hip hinges x 10  Pt requires the buoyancy and hydrostatic pressure of water for support, and to offload joints by unweighting joint load by at least 50 % in navel deep water and by at least 75-80% in chest to neck deep water.  Viscosity of the water is needed for resistance of strengthening. Water current perturbations provides challenge to standing balance requiring increased core activation.    5/29 LF row  10 lbs   Free weight punch 1lbs  Free weight bicpes 1lbs  Cybex leg press 50 lbs Seat back 4 Seat 5  LF knee extension  10 lbs  Nu-step L3 5 min    Ball roll forward 5x 10 sec hold with manual trigger point release.  Therapy reviewed use of x-fit but patient had difficulty getting fit properly  Reviewed RPE sheet and how to use in order to greater exercise weight sets and reps properly        Treatment                            5/20:  Trigger Point Dry Needling, Manual Therapy Treatment:  Initial or subsequent education regarding Trigger Point Dry Needling: Subsequent Did patient give consent to treatment with Trigger Point Dry Needling: Yes TPDN with skilled palpation and monitoring followed by STM to the following  muscles: Rt glut max proximal fibers lateral from Rt SIJ  Discussed aquatics GHJ IR behind back stretch & AROM for strength   5/16 Pulleys: 2 min flexion  Manual: trigger point release to upper trap; PROM into flexion and ER; Trigger point release to pec   Table slide for shoulder and back  Reviewed use of thera-cane for her lower back. Patient will require further cuing on use for desensitization.    5/13: PROM L shoulder Supine wand flexion x10 Scap squeezes 5" x15 Pulleys flexion x47min Bicep curl 2x10 Ball rolls at table flexion and scaption x10ea [place] flexion (partial) x10   PATIENT EDUCATION: Education details: POC, HEP, anatomy education, pain management  Person educated: Patient Education method: Explanation, Demonstration, Tactile cues, Verbal cues, and Handouts Education comprehension: verbalized understanding, returned demonstration, verbal cues required, tactile cues required, and needs further education  HOME EXERCISE PROGRAM: Access Code: [email or web address] URL: [email or web address]    Access Code: [email or web address] URL: [email or web address] Date: 09/24/2022 Prepared by: Geni Bers This aquatic home exercise program from Med[employer] utilizes pictures from land based exercises, but has been adapted prior to lamination and issuance.    ASSESSMENT:  CLINICAL IMPRESSION: Pt demonstrates safety and indep in setting with therapist instructing on deck. She is familiar with setting as she was seen here ~ 6 months ago.  I've added previous aquatic HEP, laminated and instructed her on its completion  to complete as able 1x week as she is member here at [employer]. She did require  cuing and demonstration for execution as well as edu for purpose. She will return for next, potentially final aquatic session with program for further instruction/clarifications.  She tolerates very well.  Reduction of pain by 2 points.     OBJECTIVE IMPAIRMENTS: decreased  activity tolerance, decreased ROM, and decreased strength.   ACTIVITY LIMITATIONS: carrying, lifting, sleeping, bathing, dressing, reach over head, and hygiene/grooming  PARTICIPATION LIMITATIONS: meal prep, cleaning, laundry, shopping, community activity, and yard work  PERSONAL FACTORS: 3+ comorbidities: chronic low back pain, DDD, shortness of breath, arthritis  are also affecting patient's functional outcome.   REHAB POTENTIAL: Good  CLINICAL DECISION MAKING: evolving /moderate increased pain with movement and pain causing increased difficulty with sleeping   EVALUATION COMPLEXITY: Moderate   GOALS: Goals reviewed with patient? Yes  SHORT TERM GOALS: Target date: 5/14  Patient will increase shoulder flexion and abduction by 10 degrees. Baseline: Goal status: achieved  2.  Patient will increase left UE strength by 5 lbs. Baseline:  Goal status: achieved  3.  Patient will be independent of HEP. Baseline:  Goal status: IN PROGRESS 5/13    LONG TERM GOALS: Target date: 6/4  Patient will be able to carry groceries with her left hand without any pain.  Baseline:  Goal status: INITIAL  2.  Patient will be able to reach behind her back and head without any significantly increase in pain in order to be able to dress.  Baseline:  Goal status: INITIAL  3.  Patient will return to ADL's without limitations or pain.   Baseline:  Goal status: INITIAL       PLAN:  PT FREQUENCY: 1-2x/week  PT DURATION: 8 weeks  PLANNED INTERVENTIONS: Therapeutic exercises, Therapeutic activity, Neuromuscular re-education, Balance training, Gait training, Patient/Family education, Self Care, Joint mobilization, Electrical stimulation, Cryotherapy, Moist heat, Taping, Ultrasound, Ionotophoresis 4mg /ml Dexamethasone, and Manual therapy  PLAN FOR NEXT SESSION: STM to left lateral deltoid/ Biceps , PROM in flexion and ER shoulder ER. Consider wand flexion and cane chest press if tolerated.  Consider Ionto or ultrasound to decrease acute inflammation    Rushie Chestnut) Catriona Dillenbeck MPT 09/24/22 5:03 PM

## 2022-09-25 ENCOUNTER — Inpatient Hospital Stay: Payer: Medicare Other

## 2022-09-25 ENCOUNTER — Other Ambulatory Visit: Payer: Self-pay

## 2022-09-25 DIAGNOSIS — Z95828 Presence of other vascular implants and grafts: Secondary | ICD-10-CM

## 2022-09-25 MED ORDER — SODIUM CHLORIDE 0.9% FLUSH: 10.0000 mL | Freq: Once | INTRAVENOUS | Status: DC

## 2022-09-25 MED ORDER — ALTEPLASE 2 MG IJ SOLR: 2.0000 mg | Freq: Once | INTRAMUSCULAR | Status: DC

## 2022-09-25 MED ORDER — HEPARIN SOD (PORK) LOCK FLUSH 100 UNIT/ML IV SOLN: 250.0000 [IU] | Freq: Once | INTRAVENOUS | Status: DC

## 2022-09-25 NOTE — Progress Notes (Signed)
Pt arrived today for port-a-cath phlebotomy per MD orders.   Port accessed with 20g port-a-cath needle, blood return obtained. Procedure started at [phone]. 40cc blood return obtained over 20 minutes. RN consulted with charge RN and pt agreed to re-access port with 19g needle to see if brisker blood return could be obtained with a bigger needle. RN de-accessed port and re-accessed with 19g port-a-cath needle. blood return obtained until 1033, when port-a-cath clotted off completely. Total grams removed was 270.   RN contacted provider and pt was sent to MD with port-a-cath needle still in to be scheduled for dye study of port-a-cath.

## 2022-09-25 NOTE — Patient Instructions (Signed)

## 2022-09-27 ENCOUNTER — Encounter (HOSPITAL_BASED_OUTPATIENT_CLINIC_OR_DEPARTMENT_OTHER): Payer: Medicare Other

## 2022-09-27 ENCOUNTER — Encounter (HOSPITAL_BASED_OUTPATIENT_CLINIC_OR_DEPARTMENT_OTHER): Payer: Self-pay

## 2022-09-30 ENCOUNTER — Encounter (HOSPITAL_BASED_OUTPATIENT_CLINIC_OR_DEPARTMENT_OTHER): Payer: Self-pay

## 2022-09-30 ENCOUNTER — Ambulatory Visit (HOSPITAL_BASED_OUTPATIENT_CLINIC_OR_DEPARTMENT_OTHER): Payer: Medicare Other | Admitting: Physical Therapy

## 2022-10-01 ENCOUNTER — Encounter (HOSPITAL_BASED_OUTPATIENT_CLINIC_OR_DEPARTMENT_OTHER): Payer: Self-pay | Admitting: Physical Therapy

## 2022-10-01 ENCOUNTER — Ambulatory Visit (HOSPITAL_BASED_OUTPATIENT_CLINIC_OR_DEPARTMENT_OTHER): Payer: Medicare Other | Admitting: Physical Therapy

## 2022-10-01 DIAGNOSIS — M25512 Pain in left shoulder: Secondary | ICD-10-CM | POA: Diagnosis not present

## 2022-10-01 DIAGNOSIS — M6281 Muscle weakness (generalized): Secondary | ICD-10-CM | POA: Diagnosis not present

## 2022-10-01 DIAGNOSIS — M25612 Stiffness of left shoulder, not elsewhere classified: Secondary | ICD-10-CM | POA: Diagnosis not present

## 2022-10-01 DIAGNOSIS — M5459 Other low back pain: Secondary | ICD-10-CM | POA: Diagnosis not present

## 2022-10-01 DIAGNOSIS — R262 Difficulty in walking, not elsewhere classified: Secondary | ICD-10-CM | POA: Diagnosis not present

## 2022-10-01 NOTE — Therapy (Addendum)
OUTPATIENT PHYSICAL THERAPY SHOULDER TREATMENT/Re-cert/Discharge    Patient Name: Michelle [name] MRN: [id number] DOB:1945-11-21, 77 y.o., female Today's Date: 10/01/2022  END OF SESSION:  PT End of Session - 10/01/22 1450     Visit Number 9    Number of Visits 17    Date for PT Re-Evaluation 11/15/22   Authorization Type [employer] MCR    PT Start Time 1206   pt arrived late to pool   PT Stop Time 1245    PT Time Calculation (min) 39 min    Activity Tolerance Patient tolerated treatment well;No increased pain    Behavior During Therapy WFL for tasks assessed/performed                  Past Medical History:  Diagnosis Date   Acute hypoxemic respiratory failure (HCC) 04/24/2017   Anxiety    Arthritis    "back" (04/24/2017)   Atherosclerosis of coronary artery    Blurry vision, left eye    "YAG on the left; to be repaired" (04/24/2017)   Chondromalacia of knee    right knee   Chronic bronchitis (HCC)    Chronic lower back pain    DDD (degenerative disc disease) 07/24/2011   Osteoarthritis-s/p LTKA 3'08, now right planned   Depression 07/24/2011   tx. depression only   DOE (dyspnea on exertion)    Fatty liver    History of diverticulitis 02/2018   Hx of adenomatous polyp of colon 03/21/2014   Hyperlipidemia    Hypertension    IBS (irritable bowel syndrome)    OSA on CPAP 12/07/2016   Pneumonia 1990s X 1   SOB (shortness of breath)    Vitamin D deficiency    Past Surgical History:  Procedure Laterality Date   APPENDECTOMY  1967   BOTOX INJECTION     "face"   CATARACT EXTRACTION W/ INTRAOCULAR LENS IMPLANT Bilateral 2000s   CESAREAN SECTION  1972; 1975   COLONOSCOPY W/ BIOPSIES AND POLYPECTOMY  "a few"   DILATION AND CURETTAGE OF UTERUS     HYSTEROSCOPY WITH D & C  08/29/2006   and resection of endometrial polyp/notes 09/10/2010   IR IMAGING GUIDED PORT INSERTION  01/18/2021   JOINT REPLACEMENT     KNEE ARTHROSCOPY Bilateral    NASAL SEPTUM SURGERY  1972    OOPHORECTOMY Right 1994   TOTAL KNEE ARTHROPLASTY  08/05/2011   Procedure: TOTAL KNEE ARTHROPLASTY;  Surgeon: Loanne Drilling, MD;  Location: [facility];  Service: Orthopedics;  Laterality: Right;   TOTAL KNEE ARTHROPLASTY Left 05/2009   TUBAL LIGATION  1975   Patient Active Problem List   Diagnosis Date Noted   Iron overload 06/20/2022   Dyspnea 03/25/2022   Port-A-Cath in place 08/27/2021   Macrocytosis without anemia 08/30/2020   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   Cough 05/14/2017   ETOH abuse 04/27/2017   Abnormal LFTs 04/26/2017   Thrombocytopenia (HCC) 04/26/2017   Dyspnea on exertion 03/18/2017   Morbid obesity (HCC) 03/18/2017   Obstructive sleep apnea 12/07/2016   Essential hypertension 12/07/2016   Hx of adenomatous polyp of colon 03/21/2014   Postop Hyponatremia 08/07/2011   OA (osteoarthritis) of knee 08/05/2011   Pain in limb 05/15/2011    PCP: [name], MD  REFERRING PROVIDER: [name], Md  REFERRING DIAG: [phone] (ICD-10-CM) - Pain in left shoulder   THERAPY DIAG:  Acute pain of left shoulder  Stiffness of left shoulder, not elsewhere classified  Muscle weakness (generalized)  Rationale for  Evaluation and Treatment: Rehabilitation  ONSET DATE: January 2024  SUBJECTIVE:                                                                                                                                                                                      SUBJECTIVE STATEMENT: Pt report she has been using massage hook for low back and hip with good relief.  She reports her Lt shoulder continues to be painful, especially when holding iPad in lap and lifting L elbow up to the side.   She reports she can continue aquatic exercises on her own after today's visit.   Hand dominance: Right  PERTINENT HISTORY: DDD, chronic low back pain, shortness of breath  PAIN:  Location: 4/10 Lt shoulder, 2/10 lower back Description: shoulder - ache, back -  sharp   PRECAUTIONS: Back  WEIGHT BEARING RESTRICTIONS: No  FALLS:  Has patient fallen in last 6 months? No  OCCUPATION: Retired  PLOF: Independent  PATIENT GOALS: Be able to dress without discomfort   OBJECTIVE:   POSTURE: Rounded shoulders / forward head   UPPER EXTREMITY ROM:   Active ROM Right eval Left eval Left  10/01/22  Shoulder flexion  110 140 standing  Shoulder extension     Shoulder abduction  75 85 standing (pain)   Shoulder adduction     Shoulder internal rotation   Thumb to bra strap  Shoulder external rotation   Hand behind head  Elbow flexion     Elbow extension     Wrist flexion     Wrist extension     Wrist ulnar deviation     Wrist radial deviation     Wrist pronation     Wrist supination     (Blank rows = not tested)  UPPER EXTREMITY MMT:  MMT Right eval Left eval  Shoulder flexion    Shoulder extension    Shoulder abduction    Shoulder adduction    Shoulder internal rotation    Shoulder external rotation    Middle trapezius    Lower trapezius    Elbow flexion    Elbow extension    Wrist flexion    Wrist extension    Wrist ulnar deviation    Wrist radial deviation    Wrist pronation    Wrist supination    Grip strength (lbs)    (Blank rows = not tested) not tested 2nd to irritation    LUMBAR ROM:   Active  A/PROM  eval  Flexion   Extension   Right lateral flexion   Left lateral flexion   Right rotation   Left rotation    (Blank rows =  not tested)  LOWER EXTREMITY MMT:    MMT Right eval Left eval Right 6/11 (Seated) Left  6/11 (Seated)  Hip flexion 19.6 13.9 21.7 23.6  Hip extension      Hip abduction 23.2 22.2 30.9 25.8  Hip adduction      Hip internal rotation      Hip external rotation      Knee flexion      Knee extension 19.2 21.6 37.6 34.7  Ankle dorsiflexion      Ankle plantarflexion      Ankle inversion      Ankle eversion       (Blank rows = not tested)   PALPATION:  TTP anterior left  deltoid   TODAY'S TREATMENT:                                                                                                                                         10/01/22 Measurements prior to entry in water:  ROM of shoulder/ MMT LE  Pt seen for aquatic therapy today.  Treatment took place in water 3.5-4.75 ft in depth at the [facility] pool. Temp of water was 91.  Pt entered/exited the pool via stairs with hand rail.  * unsupported walking forward/ backward with arm swing x 3 laps * side stepping with arm addct/ abdct (without floatation resistance) * hip hinges with arms on kickboard x 10 rep * farmer's walk with rainbow hand floats under water at sides - forward/ backward * staggered stance with kick board row x 10 each LE forward * light trunk rotation with hands flat on kickboard * TrA Set with blue noodle (with hole) pull down x 10, cues to slow speed * side stretch with hands at side * side lunge with hip hinge holding rails * hamstring stretch with foot on 3rd step x 15s x 2;  fig 4 stretch for piriformis x 15s each    09/24/22  Pt seen for aquatic therapy today.  Treatment took place in water 3.5-4.75 ft in depth at the [facility] pool. Temp of water was 91.  Pt entered/exited the pool via stairs with hand rail.   *walking forward back and side stepping ue support barbell then unsupported *HB (rainbow) carry forward, back and side stepping for core engagement *Standing hamstring and gastroc stretch at step - piriformis stretch - adductor stretch - TL stretch *TrA sets: solid noodle pull down wide stance then staggered x 10 reps ea *Lumbar rotation *Core engagement: KB row staggered stance ; wide stance x10 slow x 10 fast *hip hinges x 10  Pt requires the buoyancy and hydrostatic pressure of water for support, and to offload joints by unweighting joint load by at least 50 % in navel deep water and by at least 75-80% in chest to neck deep water.   Viscosity of the water is needed for resistance of strengthening. Water  current perturbations provides challenge to standing balance requiring increased core activation.    5/29 LF row  10 lbs   Free weight punch 1lbs  Free weight bicpes 1lbs  Cybex leg press 50 lbs Seat back 4 Seat 5  LF knee extension  10 lbs  Nu-step L3 5 min    Ball roll forward 5x 10 sec hold with manual trigger point release.  Therapy reviewed use of x-fit but patient had difficulty getting fit properly  Reviewed RPE sheet and how to use in order to greater exercise weight sets and reps properly   Treatment                            5/20:  Trigger Point Dry Needling, Manual Therapy Treatment:  Initial or subsequent education regarding Trigger Point Dry Needling: Subsequent Did patient give consent to treatment with Trigger Point Dry Needling: Yes TPDN with skilled palpation and monitoring followed by STM to the following muscles: Rt glut max proximal fibers lateral from Rt SIJ  Discussed aquatics GHJ IR behind back stretch & AROM for strength   5/16 Pulleys: 2 min flexion  Manual: trigger point release to upper trap; PROM into flexion and ER; Trigger point release to pec   Table slide for shoulder and back  Reviewed use of thera-cane for her lower back. Patient will require further cuing on use for desensitization.    5/13: PROM L shoulder Supine wand flexion x10 Scap squeezes 5" x15 Pulleys flexion x23min Bicep curl 2x10 Ball rolls at table flexion and scaption x10ea [place] flexion (partial) x10   PATIENT EDUCATION: Education details: POC, HEP, anatomy education, pain management  Person educated: Patient Education method: Explanation, Demonstration, Tactile cues, Verbal cues, and Handouts Education comprehension: verbalized understanding, returned demonstration, verbal cues required, tactile cues required, and needs further education  HOME EXERCISE PROGRAM: Access Code: [email or web address] URL:  [email or web address]    Access Code: [email or web address] URL: [email or web address] Date: 09/24/2022 Prepared by: Geni Bers This aquatic home exercise program from [employer] utilizes pictures from land based exercises, but has been adapted prior to lamination and issuance.    ASSESSMENT:  CLINICAL IMPRESSION: Pt required minor cues for form with exercises. Pt led through laminated HEP to foster independence;  verbalized readiness to d/c from aquatics.  She reported significant reduction in back pain during session.  No increase in shoulder pain while in water.  She continues to have limited Lt shoulder active abduction ROM.  LE strength has improved since eval. Plan to update land HEP next visit to address weakness in Lt shoulder.  Encouraged pt to avoid impingement position with Lt shoulder while using iPad in lap. Pt has partially met her goals.   PN:Pt demonstrating improvements in strength as well as ROM and has had a decrease in overall pain.  She is indep with final aquatic HEP and ready to complete on her own. Majority of goals met. She will continue to benefit from skilled physical therapy intervention land based to progress towards meeting all goals and become indep with land based HEP.     OBJECTIVE IMPAIRMENTS: decreased activity tolerance, decreased ROM, and decreased strength.   ACTIVITY LIMITATIONS: carrying, lifting, sleeping, bathing, dressing, reach over head, and hygiene/grooming  PARTICIPATION LIMITATIONS: meal prep, cleaning, laundry, shopping, community activity, and yard work  PERSONAL FACTORS: 3+ comorbidities: chronic low back pain, DDD, shortness of breath, arthritis  are also affecting patient's functional outcome.   REHAB POTENTIAL: Good  CLINICAL DECISION MAKING: evolving /moderate increased pain with movement and pain causing increased difficulty with sleeping   EVALUATION COMPLEXITY: Moderate   GOALS: Goals reviewed with  patient? Yes  SHORT TERM GOALS: Target date: 5/14  Patient will increase shoulder flexion and abduction by 10 degrees. Baseline: Goal status: achieved  2.  Patient will increase left UE strength by 5 lbs. Baseline:  Goal status: achieved  3.  Patient will be independent of HEP. Baseline: independent with aquatic HEP Goal status: Partially met - 10/01/22    LONG TERM GOALS: Target date: 6/4  Patient will be able to carry groceries with her left hand without any pain.  Baseline: Can carry groceries with L hand, but pain with pain 5/10 Goal status: ONGOING -10/01/22  2.  Patient will be able to reach behind her back and head without any significantly increase in pain in order to be able to dress.  Baseline: can reach behind back and behind head, but painful Goal status:Partially met 10/01/22  3.  Patient will return to ADL's without limitations or pain.   Baseline:  Goal status: MET -10/01/22       PLAN:  PT FREQUENCY: 1-2x/week  PT DURATION: 6 weeks  PLANNED INTERVENTIONS: Therapeutic exercises, Therapeutic activity, Neuromuscular re-education, Balance training, Gait training, Patient/Family education, Self Care, Joint mobilization, Electrical stimulation, Cryotherapy, Moist heat, Taping, Ultrasound, Ionotophoresis 4mg /ml Dexamethasone, and Manual therapy  PLAN FOR NEXT SESSION: STM to left lateral deltoid/ Biceps , PROM in flexion and ER shoulder ER. Consider wand flexion and cane chest press if tolerated. Consider Ionto or ultrasound to decrease acute inflammation    PHYSICAL THERAPY DISCHARGE SUMMARY  Visits from Start of Care: 9  Current functional level related to goals / functional outcomes: Imprvemtn ability to exercise and improved pain    Remaining deficits: Pain in her back    Education / Equipment: HEP    Patient agrees to discharge. Patient goals were partially met. Patient is being discharged due to not returning since the last visit.  D/C done by  Lorayne Bender PT DPT   Mayer Camel, PTA 10/01/22 2:56 PM [facility] [address] [place], [place], [address] Phone: [phone]   Fax:  [phone]  Addend Rushie Chestnut) Ziemba MPT 10/08/22 450p

## 2022-10-08 NOTE — Addendum Note (Signed)
Addended by: Lynnette Caffey F on: 10/08/2022 04:54 PM   Modules accepted: Orders

## 2022-10-10 ENCOUNTER — Other Ambulatory Visit (HOSPITAL_COMMUNITY): Payer: Self-pay

## 2022-10-10 MED ORDER — OZEMPIC (2 MG/DOSE) 8 MG/3ML ~~LOC~~ SOPN
2.0000 mg | PEN_INJECTOR | SUBCUTANEOUS | 1 refills | Status: DC
  Filled 2022-10-10: qty 9, 84d supply, fill #0

## 2022-10-15 ENCOUNTER — Ambulatory Visit (HOSPITAL_BASED_OUTPATIENT_CLINIC_OR_DEPARTMENT_OTHER): Payer: Medicare Other | Admitting: Physical Therapy

## 2022-10-17 ENCOUNTER — Other Ambulatory Visit (HOSPITAL_COMMUNITY): Payer: Self-pay

## 2022-10-17 ENCOUNTER — Other Ambulatory Visit: Payer: Self-pay | Admitting: *Deleted

## 2022-10-17 ENCOUNTER — Telehealth: Payer: Self-pay | Admitting: *Deleted

## 2022-10-17 ENCOUNTER — Encounter: Payer: Self-pay | Admitting: Hematology and Oncology

## 2022-10-17 NOTE — Telephone Encounter (Addendum)
Late entry   On 09/25/2022 pt had difficulty with port not giving good blood return.   Pt was brought over to this RN who assessed line- noted line flushed without difficultly - not able to get blood return.  Note this RN was able to flush without resistance 20 cc NS with no issues stated by pt.  Dye study discussed as to if beneficial - with issue likely more due to pressure being used for pulling of blood in large volumes.  Per discussion with MD- plan is to give 500 cc NS prior to pulling of blood for possible benefit.  Pt is in agreement to plan.

## 2022-10-18 ENCOUNTER — Telehealth: Payer: Self-pay | Admitting: Hematology and Oncology

## 2022-10-18 NOTE — Telephone Encounter (Signed)
Spoke with patient confirming upcoming appointment  

## 2022-10-28 ENCOUNTER — Inpatient Hospital Stay: Payer: Medicare Other | Attending: Hematology and Oncology

## 2022-10-28 DIAGNOSIS — Z95828 Presence of other vascular implants and grafts: Secondary | ICD-10-CM

## 2022-10-28 MED ORDER — SODIUM CHLORIDE 0.9 % IV SOLN: INTRAVENOUS | Status: AC

## 2022-10-28 NOTE — Progress Notes (Signed)
Michelle [name] presents today for phlebotomy per MD orders. Pt had PAC that was accessed for phlebotomy. Pt was given NS before procedure as ordered by Dr Al Pimple. Phlebotomy procedure started at 1514 and ended at 1552. 229 grams removed. Blood return was sluggish, and blood was thick and viscous. Patient observed for 30 minutes after procedure without any incident. Patient tolerated procedure well.

## 2022-10-29 ENCOUNTER — Telehealth: Payer: Self-pay | Admitting: Hematology and Oncology

## 2022-10-29 NOTE — Telephone Encounter (Signed)
Patient will be on vacation and will be unavailable for original appointment date; patient is aware of rescheduled appointment date/times

## 2022-10-30 ENCOUNTER — Encounter: Payer: Self-pay | Admitting: Hematology and Oncology

## 2022-10-30 ENCOUNTER — Ambulatory Visit (HOSPITAL_BASED_OUTPATIENT_CLINIC_OR_DEPARTMENT_OTHER): Payer: Medicare Other | Admitting: Physical Therapy

## 2022-10-30 ENCOUNTER — Encounter (HOSPITAL_BASED_OUTPATIENT_CLINIC_OR_DEPARTMENT_OTHER): Payer: Self-pay

## 2022-11-04 ENCOUNTER — Encounter: Payer: Self-pay | Admitting: *Deleted

## 2022-11-13 ENCOUNTER — Other Ambulatory Visit (HOSPITAL_COMMUNITY): Payer: Self-pay

## 2022-11-19 ENCOUNTER — Other Ambulatory Visit (HOSPITAL_COMMUNITY): Payer: Self-pay

## 2022-11-21 ENCOUNTER — Encounter: Payer: Self-pay | Admitting: *Deleted

## 2022-11-21 DIAGNOSIS — M8588 Other specified disorders of bone density and structure, other site: Secondary | ICD-10-CM | POA: Diagnosis not present

## 2022-11-22 ENCOUNTER — Telehealth: Payer: Self-pay | Admitting: Hematology and Oncology

## 2022-11-22 NOTE — Telephone Encounter (Signed)
Patient is aware of upcoming appointment times/dates.  

## 2022-11-29 ENCOUNTER — Other Ambulatory Visit: Payer: Self-pay

## 2022-11-29 ENCOUNTER — Inpatient Hospital Stay: Payer: Medicare Other | Attending: Hematology and Oncology

## 2022-11-29 DIAGNOSIS — Z95828 Presence of other vascular implants and grafts: Secondary | ICD-10-CM

## 2022-11-29 MED ORDER — SODIUM CHLORIDE 0.9 % IV SOLN: INTRAVENOUS | Status: AC

## 2022-11-29 MED ORDER — SODIUM CHLORIDE 0.9% FLUSH
10.0000 mL | Freq: Once | INTRAVENOUS | Status: AC
  Administered 2022-11-29: 10 mL

## 2022-11-29 MED ORDER — HEPARIN SOD (PORK) LOCK FLUSH 100 UNIT/ML IV SOLN
500.0000 [IU] | Freq: Once | INTRAVENOUS | Status: AC
  Administered 2022-11-29: 500 [IU]

## 2022-11-29 NOTE — Progress Notes (Signed)
Per Rona Ravens, RN - patient to proceed with today's phlebotomy without updated labs. Patient is to return to clinic in 1 month for lab recheck.

## 2022-11-29 NOTE — Progress Notes (Signed)
Patient's PAC accessed with 19 G needle and given 500 ml of NS prior to phlebotomy start. Phlebotomy started at 1542. RN able to draw off ~61 ml of blood when line clotted. RN changed caps and was able to remove a clot. Line flushed well, but sluggish blood return.  Patient agreeable to trying to re-access the port with new needle. PAC de-accessed and re-accessed with new 19 G needle. Flushed line with additional IVF bolus.  Line flushed well, but blood return still sluggish.  Val, RN came to assess patient, but unable to get good blood return from [facility]. Pt agreeable to starting peripheral phlebotomy.  Phlebotomy procedure started at 1655 and ended at 1735. 131 grams removed via 20 G in upper R arm. IV needle removed intact per patient request as patient had begun to feel lightheaded.  Patient observed for 30 minutes after procedure without any incident. Drink provided, patient declined snack.  Patient had no signs of lightheadedness at time of discharge. Vital signs stable and within normal parameters. In basket message sent to notify provider and desk RN of outcome of phlebotomy.

## 2022-12-03 ENCOUNTER — Other Ambulatory Visit: Payer: Self-pay | Admitting: Hematology and Oncology

## 2022-12-03 ENCOUNTER — Other Ambulatory Visit (HOSPITAL_COMMUNITY): Payer: Self-pay

## 2022-12-03 DIAGNOSIS — Z95828 Presence of other vascular implants and grafts: Secondary | ICD-10-CM

## 2022-12-12 ENCOUNTER — Other Ambulatory Visit (HOSPITAL_COMMUNITY): Payer: Self-pay

## 2022-12-12 ENCOUNTER — Telehealth: Payer: Self-pay | Admitting: *Deleted

## 2022-12-12 NOTE — Telephone Encounter (Signed)
This RN spoke with pt per her call- stating concern due to lack of ability to get good blood amount with past 3 phlebotomies and what Dr Al Pimple thinks and any further recommendations.  She states she has been researching alternatives to phlebotomies- and is wondering about chelation therapy.  She is scheduled to come in later in September for lab and MD- this RN informed MD has been made aware of the issues- with goal for nurses to do what they know how to do to obtain blood via phlebotomy- which unfortunately we have not been successful.  This RN will inform MD of pt's concerns for possible earlier phone visit or obtaining of new labs for evaluation.  Patient stated appreciation of discussion and plan.  Return call number given as [phone]

## 2022-12-16 ENCOUNTER — Other Ambulatory Visit (HOSPITAL_COMMUNITY): Payer: Self-pay

## 2022-12-26 ENCOUNTER — Telehealth: Payer: Self-pay | Admitting: Pulmonary Disease

## 2022-12-26 DIAGNOSIS — Z96652 Presence of left artificial knee joint: Secondary | ICD-10-CM | POA: Diagnosis not present

## 2022-12-26 DIAGNOSIS — M25552 Pain in left hip: Secondary | ICD-10-CM | POA: Diagnosis not present

## 2022-12-26 DIAGNOSIS — Z471 Aftercare following joint replacement surgery: Secondary | ICD-10-CM | POA: Diagnosis not present

## 2022-12-26 DIAGNOSIS — G4733 Obstructive sleep apnea (adult) (pediatric): Secondary | ICD-10-CM

## 2022-12-26 NOTE — Telephone Encounter (Signed)
Pt needs a prescription [facility] for a new CPAP Mask

## 2022-12-26 NOTE — Telephone Encounter (Signed)
Prescription ordered.

## 2022-12-30 ENCOUNTER — Telehealth: Payer: Self-pay | Admitting: Internal Medicine

## 2022-12-30 DIAGNOSIS — J441 Chronic obstructive pulmonary disease with (acute) exacerbation: Secondary | ICD-10-CM | POA: Diagnosis not present

## 2022-12-30 DIAGNOSIS — R0902 Hypoxemia: Secondary | ICD-10-CM | POA: Diagnosis not present

## 2022-12-30 DIAGNOSIS — E119 Type 2 diabetes mellitus without complications: Secondary | ICD-10-CM | POA: Diagnosis not present

## 2022-12-30 DIAGNOSIS — R092 Respiratory arrest: Secondary | ICD-10-CM | POA: Diagnosis not present

## 2022-12-30 DIAGNOSIS — G4733 Obstructive sleep apnea (adult) (pediatric): Secondary | ICD-10-CM | POA: Diagnosis not present

## 2022-12-30 DIAGNOSIS — I10 Essential (primary) hypertension: Secondary | ICD-10-CM | POA: Diagnosis not present

## 2022-12-30 NOTE — Telephone Encounter (Signed)
Patient was last seen 12/12/2021 and will need to have current F2F notes stating Cpap usage please see Brad from [employer]'s note Order has been received. However, we need an OV note showing usage and benefit to process this request. I looked in the [facility] notes and do not see a F2F note.

## 2023-01-03 ENCOUNTER — Other Ambulatory Visit (HOSPITAL_COMMUNITY): Payer: Self-pay

## 2023-01-03 NOTE — Telephone Encounter (Signed)
Patient scheduled 02/17/2023 with Ames Dura NP.

## 2023-01-07 ENCOUNTER — Ambulatory Visit: Payer: Medicare Other | Admitting: Hematology and Oncology

## 2023-01-07 ENCOUNTER — Other Ambulatory Visit: Payer: Medicare Other

## 2023-01-08 ENCOUNTER — Inpatient Hospital Stay: Payer: Medicare Other | Attending: Hematology and Oncology

## 2023-01-08 DIAGNOSIS — Z87891 Personal history of nicotine dependence: Secondary | ICD-10-CM | POA: Diagnosis not present

## 2023-01-08 DIAGNOSIS — D696 Thrombocytopenia, unspecified: Secondary | ICD-10-CM

## 2023-01-08 LAB — CMP (CANCER CENTER ONLY)
ALT: 28 U/L (ref 0–44)
AST: 21 U/L (ref 15–41)
Albumin: 4.1 g/dL (ref 3.5–5.0)
Alkaline Phosphatase: 71 U/L (ref 38–126)
Anion gap: 9 (ref 5–15)
BUN: 28 mg/dL — ABNORMAL HIGH (ref 8–23)
CO2: 24 mmol/L (ref 22–32)
Calcium: 9.1 mg/dL (ref 8.9–10.3)
Chloride: 104 mmol/L (ref 98–111)
Creatinine: 1.14 mg/dL — ABNORMAL HIGH (ref 0.44–1.00)
GFR, Estimated: 50 mL/min — ABNORMAL LOW (ref >60)
Glucose, Bld: 109 mg/dL — ABNORMAL HIGH (ref 70–99)
Potassium: 4.2 mmol/L (ref 3.5–5.1)
Sodium: 137 mmol/L (ref 135–145)
Total Bilirubin: 1.1 mg/dL (ref 0.3–1.2)
Total Protein: 7.2 g/dL (ref 6.5–8.1)

## 2023-01-08 LAB — CBC WITH DIFFERENTIAL (CANCER CENTER ONLY)
Abs Immature Granulocytes: 0.01 K/uL (ref 0.00–0.07)
Basophils Absolute: 0.1 K/uL (ref 0.0–0.1)
Basophils Relative: 1 %
Eosinophils Absolute: 0.1 K/uL (ref 0.0–0.5)
Eosinophils Relative: 1 %
HCT: 41.8 % (ref 36.0–46.0)
Hemoglobin: 14.7 g/dL (ref 12.0–15.0)
Immature Granulocytes: 0 %
Lymphocytes Relative: 32 %
Lymphs Abs: 1.4 K/uL (ref 0.7–4.0)
MCH: 35.1 pg — ABNORMAL HIGH (ref 26.0–34.0)
MCHC: 35.2 g/dL (ref 30.0–36.0)
MCV: 99.8 fL (ref 80.0–100.0)
Monocytes Absolute: 0.6 K/uL (ref 0.1–1.0)
Monocytes Relative: 13 %
Neutro Abs: 2.3 K/uL (ref 1.7–7.7)
Neutrophils Relative %: 53 %
Platelet Count: 179 K/uL (ref 150–400)
RBC: 4.19 MIL/uL (ref 3.87–5.11)
RDW: 12.1 % (ref 11.5–15.5)
WBC Count: 4.4 K/uL (ref 4.0–10.5)
nRBC: 0 % (ref 0.0–0.2)

## 2023-01-08 LAB — FERRITIN: Ferritin: 199 ng/mL (ref 11–307)

## 2023-01-09 ENCOUNTER — Other Ambulatory Visit (HOSPITAL_COMMUNITY): Payer: Self-pay

## 2023-01-09 DIAGNOSIS — L299 Pruritus, unspecified: Secondary | ICD-10-CM | POA: Diagnosis not present

## 2023-01-15 ENCOUNTER — Inpatient Hospital Stay (HOSPITAL_BASED_OUTPATIENT_CLINIC_OR_DEPARTMENT_OTHER): Payer: Medicare Other | Admitting: Hematology and Oncology

## 2023-01-15 ENCOUNTER — Other Ambulatory Visit: Payer: Medicare Other

## 2023-01-15 DIAGNOSIS — Z87891 Personal history of nicotine dependence: Secondary | ICD-10-CM | POA: Diagnosis not present

## 2023-01-15 NOTE — Assessment & Plan Note (Signed)
Hemochromatosis Ferritin level of 199, increased from previous level. 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 Liver enzymes are normal. Phlebotomies have been challenging for the patient. -Continue monitoring ferritin levels. -Reduce frequency of phlebotomies to twice a year. -Next phlebotomy and labs scheduled for January 2025.  Weight Management Patient has lost 26 pounds since December 2023 with Ozempic 5mg . -Continue Ozempic 5mg . -Encourage continued weight loss efforts.  General Health Maintenance Patient reported generalized itching on legs. No diagnosis or cause identified. -Consider trial of baby aspirin if patient desires, with monitoring for potential side effects such as gastritis. -Encourage hydration, especially with use of Ozempic. -Recommend follow-up with primary care provider for potential kidney function evaluation based on BUN and creatinine levels.

## 2023-01-15 NOTE — Progress Notes (Signed)
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 female returns for follow-up of her history of iron overload.  She had her last phlebotomy in Aug 2024.  The patient, with a history of hemochromatosis, has been undergoing phlebotomy treatments. However, these treatments have been challenging for her, causing significant distress. She also has a port for the phlebotomy, but it has not been working well. The patient's ferritin levels have been increasing, which is a concern. She has been considering reducing the frequency of her phlebotomies to twice a year, which the doctor agrees might be a good balance for her.  In addition to her hemochromatosis, the patient has been experiencing a severe internal itch all over her legs. She sought treatment at an urgent care center and was given medication for the itch. However, the cause of the itch remains unknown.  The patient has also been on Ozempic for weight loss and has successfully lost 26 pounds since December. She is considering adding baby aspirin to her regimen but is unsure if it is medically indicated. She is also preparing for a trip to [place] and is concerned about the physical demands of the trip.  Patient Active Problem List   Diagnosis Date Noted   Iron overload 06/20/2022   Dyspnea 03/25/2022   Port-A-Cath in place 08/27/2021   Macrocytosis without anemia 08/30/2020   Hereditary hemochromatosis (HCC)  06/19/2020   Cough 05/14/2017   ETOH abuse 04/27/2017   Abnormal LFTs 04/26/2017   Thrombocytopenia (HCC) 04/26/2017   Dyspnea on exertion 03/18/2017   Morbid obesity (HCC) 03/18/2017   Obstructive sleep apnea 12/07/2016   Essential hypertension 12/07/2016   Hx of adenomatous polyp of colon 03/21/2014   Postop Hyponatremia 08/07/2011   OA (osteoarthritis) of knee 08/05/2011   Pain in limb 05/15/2011    has No Known Allergies.  MEDICAL HISTORY: Past Medical History:  Diagnosis Date   Acute hypoxemic respiratory failure (HCC) 04/24/2017   Anxiety    Arthritis    "back" (04/24/2017)   Atherosclerosis of coronary artery    Blurry vision, left eye    "YAG on the left; to be repaired" (04/24/2017)   Chondromalacia of knee    right knee   Chronic bronchitis (HCC)    Chronic lower back pain    DDD (degenerative disc disease) 07/24/2011   Osteoarthritis-s/p LTKA 4'13, now right planned   Depression 07/24/2011   tx. depression only   DOE (dyspnea on exertion)    Fatty liver    History of diverticulitis 02/2018   Hx of adenomatous polyp of colon 03/21/2014   Hyperlipidemia    Hypertension    IBS (irritable bowel syndrome)    OSA on CPAP 12/07/2016   Pneumonia 1990s X 1   SOB (shortness of breath)    Vitamin D deficiency     SURGICAL HISTORY: Past Surgical History:  Procedure Laterality Date   APPENDECTOMY  1967   BOTOX INJECTION     "face"   CATARACT EXTRACTION W/ INTRAOCULAR LENS IMPLANT Bilateral 2000s  CESAREAN SECTION  1972; 1975   COLONOSCOPY W/ BIOPSIES AND POLYPECTOMY  "a few"   DILATION AND CURETTAGE OF UTERUS     HYSTEROSCOPY WITH D & C  08/29/2006   and resection of endometrial polyp/notes 09/10/2010   IR IMAGING GUIDED PORT INSERTION  01/18/2021   JOINT REPLACEMENT     KNEE ARTHROSCOPY Bilateral    NASAL SEPTUM SURGERY  1972   OOPHORECTOMY Right 1994   TOTAL KNEE ARTHROPLASTY  08/05/2011   Procedure: TOTAL KNEE ARTHROPLASTY;  Surgeon: Loanne Drilling, MD;   Location: [facility];  Service: Orthopedics;  Laterality: Right;   TOTAL KNEE ARTHROPLASTY Left 05/2009   TUBAL LIGATION  1975    SOCIAL HISTORY: Social History   Socioeconomic History   Marital status: Divorced    Spouse name: Not on file   Number of children: 2   Years of education: Not on file   Highest education level: Not on file  Occupational History   Occupation: Retired Haematologist  Tobacco Use   Smoking status: Former    Current packs/day: 0.00    Average packs/day: 0.1 packs/day for 36.0 years (4.3 ttl pk-yrs)    Types: Cigarettes    Start date: 06/19/1978    Quit date: 06/19/2014    Years since quitting: 8.5   Smokeless tobacco: Never  Vaping Use   Vaping status: Never Used  Substance and Sexual Activity   Alcohol use: Yes    Alcohol/week: 21.0 standard drinks of alcohol    Types: 21 Glasses of wine per week    Comment: 04/24/2017 "3, glasses of white wine qd"   Drug use: No   Sexual activity: Never  Other Topics Concern   Not on file  Social History Narrative   Not on file   Social Determinants of Health   Financial Resource Strain: Not on file  Food Insecurity: Not on file  Transportation Needs: Not on file  Physical Activity: Not on file  Stress: Not on file  Social Connections: Not on file  Intimate Partner Violence: Not on file    FAMILY HISTORY: Family History  Problem Relation Age of Onset   Heart disease Mother        Rheumatic heart disease   Pulmonary embolism Mother    Heart disease Father        valvular heart disease   Stroke Father    Hypertension Sister    Colon cancer Neg Hx    Esophageal cancer Neg Hx    Rectal cancer Neg Hx    Stomach cancer Neg Hx    Pancreatic cancer Neg Hx     Review of Systems  Constitutional:  Negative for appetite change, chills, fatigue, fever and unexpected weight change.  HENT:   Negative for hearing loss, lump/mass and trouble swallowing.   Eyes:  Negative for eye problems and icterus.  Respiratory:   Negative for chest tightness, cough and shortness of breath.   Cardiovascular:  Negative for chest pain, leg swelling and palpitations.  Gastrointestinal:  Negative for abdominal distention, abdominal pain, constipation, diarrhea, nausea and vomiting.  Endocrine: Negative for hot flashes.  Genitourinary:  Negative for difficulty urinating.   Musculoskeletal:  Negative for arthralgias.  Skin:  Negative for itching and rash.  Neurological:  Negative for dizziness, extremity weakness, headaches and numbness.  Hematological:  Negative for adenopathy. Does not bruise/bleed easily.  Psychiatric/Behavioral:  Negative for depression. The patient is not nervous/anxious.       PHYSICAL EXAMINATION  ECOG  PERFORMANCE STATUS: 0 - Asymptomatic  Vitals:   01/15/23 1334  BP: 124/78  Pulse: 65  Resp: 18  Temp: 97.7 F (36.5 C)  SpO2: 98%     Physical Exam Constitutional:      General: She is not in acute distress.    Appearance: Normal appearance. She is not toxic-appearing.  HENT:     Head: Normocephalic and atraumatic.  Eyes:     General: No scleral icterus. Cardiovascular:     Rate and Rhythm: Normal rate and regular rhythm.     Pulses: Normal pulses.     Heart sounds: Normal heart sounds.  Pulmonary:     Effort: Pulmonary effort is normal.     Breath sounds: Normal breath sounds.  Abdominal:     General: Abdomen is flat. Bowel sounds are normal. There is no distension.     Palpations: Abdomen is soft.     Tenderness: There is no abdominal tenderness.  Musculoskeletal:        General: No swelling.     Cervical back: Neck supple.  Lymphadenopathy:     Cervical: No cervical adenopathy.  Skin:    General: Skin is warm and dry.     Findings: No rash.  Neurological:     General: No focal deficit present.     Mental Status: She is alert.  Psychiatric:        Mood and Affect: Mood normal.        Behavior: Behavior normal.     LABORATORY DATA:  CBC    Component Value  Date/Time   WBC 4.4 01/08/2023 1234   WBC 4.3 09/23/2022 1431   RBC 4.19 01/08/2023 1234   HGB 14.7 01/08/2023 1234   HGB 13.7 10/04/2021 1424   HCT 41.8 01/08/2023 1234   HCT 40.4 10/04/2021 1424   PLT 179 01/08/2023 1234   MCV 99.8 01/08/2023 1234   MCV 99 (H) 02/18/2018 1217   MCH 35.1 (H) 01/08/2023 1234   MCHC 35.2 01/08/2023 1234   RDW 12.1 01/08/2023 1234   RDW 12.1 (L) 02/18/2018 1217   LYMPHSABS 1.4 01/08/2023 1234   LYMPHSABS 1.7 02/18/2018 1217   MONOABS 0.6 01/08/2023 1234   EOSABS 0.1 01/08/2023 1234   EOSABS 0.2 02/18/2018 1217   BASOSABS 0.1 01/08/2023 1234   BASOSABS 0.1 02/18/2018 1217    CMP     Component Value Date/Time   NA 137 01/08/2023 1234   NA 138 10/04/2021 1424   K 4.2 01/08/2023 1234   CL 104 01/08/2023 1234   CO2 24 01/08/2023 1234   GLUCOSE 109 (H) 01/08/2023 1234   BUN 28 (H) 01/08/2023 1234   BUN 21 10/04/2021 1424   CREATININE 1.14 (H) 01/08/2023 1234   CALCIUM 9.1 01/08/2023 1234   PROT 7.2 01/08/2023 1234   PROT 7.0 02/18/2018 1217   ALBUMIN 4.1 01/08/2023 1234   ALBUMIN 4.4 02/18/2018 1217   AST 21 01/08/2023 1234   ALT 28 01/08/2023 1234   ALKPHOS 71 01/08/2023 1234   BILITOT 1.1 01/08/2023 1234   GFRNONAA 50 (L) 01/08/2023 1234   GFRAA >60 08/09/2018 1835    ASSESSMENT and THERAPY PLAN:   Hereditary hemochromatosis (HCC) Hemochromatosis Ferritin level of 199, increased from previous level. 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 Liver enzymes are normal. Phlebotomies have been challenging for the patient. -Continue monitoring ferritin levels. -Reduce frequency of phlebotomies to twice a year. -Next phlebotomy and labs scheduled for January 2025.  Weight Management Patient has lost 26 pounds since December  2023 with Ozempic 5mg . -Continue Ozempic 5mg . -Encourage continued weight loss efforts.  General Health Maintenance Patient reported generalized itching on legs. No diagnosis or cause identified. -Consider trial of baby  aspirin if patient desires, with monitoring for potential side effects such as gastritis. -Encourage hydration, especially with use of Ozempic. -Recommend follow-up with primary care provider for potential kidney function evaluation based on BUN and creatinine levels.    All questions were answered. The patient knows to call the clinic with any problems, questions or concerns. We can certainly see the patient much sooner if necessary.  Total encounter time:30 minutes*in face-to-face visit time, chart review, lab review, care coordination, order entry, and documentation of the encounter time.  *Total Encounter Time as defined by the Centers for Medicare and Medicaid Services includes, in addition to the face-to-face time of a patient visit (documented in the note above) non-face-to-face time: obtaining and reviewing outside history, ordering and reviewing medications, tests or procedures, care coordination (communications with other health care professionals or caregivers) and documentation in the medical record.

## 2023-01-17 ENCOUNTER — Telehealth: Payer: Self-pay | Admitting: Hematology and Oncology

## 2023-01-17 NOTE — Telephone Encounter (Signed)
Patient is aware of scheduled appointment times/dates

## 2023-01-29 ENCOUNTER — Ambulatory Visit (HOSPITAL_BASED_OUTPATIENT_CLINIC_OR_DEPARTMENT_OTHER): Payer: Medicare Other | Attending: Student | Admitting: Physical Therapy

## 2023-01-29 ENCOUNTER — Other Ambulatory Visit: Payer: Self-pay

## 2023-01-29 DIAGNOSIS — M25552 Pain in left hip: Secondary | ICD-10-CM | POA: Insufficient documentation

## 2023-01-29 DIAGNOSIS — G4733 Obstructive sleep apnea (adult) (pediatric): Secondary | ICD-10-CM | POA: Diagnosis not present

## 2023-01-29 DIAGNOSIS — J441 Chronic obstructive pulmonary disease with (acute) exacerbation: Secondary | ICD-10-CM | POA: Diagnosis not present

## 2023-01-29 DIAGNOSIS — R2689 Other abnormalities of gait and mobility: Secondary | ICD-10-CM | POA: Insufficient documentation

## 2023-01-29 DIAGNOSIS — M25652 Stiffness of left hip, not elsewhere classified: Secondary | ICD-10-CM | POA: Diagnosis not present

## 2023-01-29 DIAGNOSIS — R092 Respiratory arrest: Secondary | ICD-10-CM | POA: Diagnosis not present

## 2023-01-29 DIAGNOSIS — R0902 Hypoxemia: Secondary | ICD-10-CM | POA: Diagnosis not present

## 2023-01-29 NOTE — Therapy (Signed)
OUTPATIENT PHYSICAL THERAPY LOWER EXTREMITY EVALUATION   Patient Name: Michelle [name] MRN: [id number] DOB:July 22, 1945, 77 y.o., female Today's Date: 01/29/2023  END OF SESSION:   Past Medical History:  Diagnosis Date   Acute hypoxemic respiratory failure (HCC) 04/24/2017   Anxiety    Arthritis    "back" (04/24/2017)   Atherosclerosis of coronary artery    Blurry vision, left eye    "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 depression only   DOE (dyspnea on exertion)    Fatty liver    History of diverticulitis 02/2018   Hx of adenomatous polyp of colon 03/21/2014   Hyperlipidemia    Hypertension    IBS (irritable bowel syndrome)    OSA on CPAP 12/07/2016   Pneumonia 1990s X 1   SOB (shortness of breath)    Vitamin D deficiency    Past Surgical History:  Procedure Laterality Date   APPENDECTOMY  1967   BOTOX INJECTION     "face"   CATARACT EXTRACTION W/ INTRAOCULAR LENS IMPLANT Bilateral 2000s   CESAREAN SECTION  1972; 1975   COLONOSCOPY W/ BIOPSIES AND POLYPECTOMY  "a few"   DILATION AND CURETTAGE OF UTERUS     HYSTEROSCOPY WITH D & C  08/29/2006   and resection of endometrial polyp/notes 09/10/2010   IR IMAGING GUIDED PORT INSERTION  01/18/2021   JOINT REPLACEMENT     KNEE ARTHROSCOPY Bilateral    NASAL SEPTUM SURGERY  1972   OOPHORECTOMY Right 1994   TOTAL KNEE ARTHROPLASTY  08/05/2011   Procedure: TOTAL KNEE ARTHROPLASTY;  Surgeon: Loanne Drilling, MD;  Location: [facility];  Service: Orthopedics;  Laterality: Right;   TOTAL KNEE ARTHROPLASTY Left 05/2009   TUBAL LIGATION  1975   Patient Active Problem List   Diagnosis Date Noted   Iron overload 06/20/2022   Dyspnea 03/25/2022   Port-A-Cath in place 08/27/2021   Macrocytosis without anemia  08/30/2020   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   Cough 05/14/2017   ETOH abuse 04/27/2017   Abnormal LFTs 04/26/2017   Thrombocytopenia (HCC) 04/26/2017   Dyspnea on exertion 03/18/2017   Morbid obesity (HCC) 03/18/2017   Obstructive sleep apnea 12/07/2016   Essential hypertension 12/07/2016   Hx of adenomatous polyp of colon 03/21/2014   Postop Hyponatremia 08/07/2011   OA (osteoarthritis) of knee 08/05/2011   Pain in limb 05/15/2011    PCP: [name] MD   REFERRING PROVIDER: Eartha Inch PA  REFERRING DIAG:  Diagnosis  M25.552 (ICD-10-CM) - Pain in left hip    THERAPY DIAG:  No diagnosis found.  Rationale for Evaluation and Treatment: Rehabilitation  ONSET DATE: about 3 to 4 months she began having increased pain.   SUBJECTIVE:   SUBJECTIVE STATEMENT: The patient had an acute onset of left  hip pain about 3-4 months ago. She has increased pain with standing and walking. She had an injection a few weeks ago which helped for a short period of time.    PERTINENT HISTORY: DDD, chronic low back pain, shortness of breath, shoulder pain  PAIN:  Are you having pain? Yes: NPRS scale: 5-6/10 Pain location: left lateral hip  Pain description: aching  Aggravating factors: standing and walking  Relieving factors: rest/ ice  PRECAUTIONS: None  RED FLAGS: None   WEIGHT BEARING RESTRICTIONS: No  FALLS:  Has patient fallen in last 6 months? No  LIVING ENVIRONMENT: 2 steps into the front door other wise 1 level OCCUPATION:  Retired   Hobbies:  Nothing that the hip is preventing her from doing Likes to dance    PLOF: Independent  PATIENT GOALS:     NEXT MD VISIT:   OBJECTIVE:  Note: Objective measures were completed at Evaluation unless otherwise noted.  DIAGNOSTIC FINDINGS:  Left knee: (-)   PATIENT SURVEYS:  FOTO    COGNITION: Overall cognitive status: Within functional limits for tasks  assessed     SENSATION: WFL     POSTURE: No Significant postural limitations  PALPATION: Tender to palpation in the lateral hip around the brusa  LOWER EXTREMITY ROM:  Passive ROM Right eval Left eval  Hip flexion    Hip extension    Hip abduction    Hip adduction    Hip internal rotation    Hip external rotation    Knee flexion    Knee extension    Ankle dorsiflexion    Ankle plantarflexion    Ankle inversion    Ankle eversion     (Blank rows = not tested)  LOWER EXTREMITY MMT:  MMT Right eval Left eval  Hip flexion 16.3 10.3  Hip extension    Hip abduction 19.8 16.9  Hip adduction    Hip internal rotation    Hip external rotation    Knee flexion    Knee extension 31 26.9  Ankle dorsiflexion    Ankle plantarflexion    Ankle inversion    Ankle eversion     (Blank rows = not tested)  LOWER EXTREMITY SPECIAL TESTS:  {LEspecialtests:26242}  FUNCTIONAL TESTS:  {Functional tests:24029}  GAIT: Distance walked: *** Assistive device utilized: {Assistive devices:23999} Level of assistance: {Levels of assistance:24026} Comments: ***   TODAY'S TREATMENT:                                                                                                                              DATE: ***    PATIENT EDUCATION:  Education details: HEP, symptom management  Person educated: Patient Education method: Explanation, Demonstration, Tactile cues, Verbal cues, and Handouts Education comprehension: verbalized understanding, returned demonstration, verbal cues required, tactile cues required, and needs further education  HOME EXERCISE PROGRAM: ***  ASSESSMENT:  CLINICAL IMPRESSION: Patient is a *** y.o. *** who was seen today for physical therapy evaluation and treatment for ***.   OBJECTIVE IMPAIRMENTS: Abnormal gait, decreased activity tolerance, difficulty walking, decreased ROM, decreased strength, and pain.   ACTIVITY LIMITATIONS:  {activitylimitations:27494}  PARTICIPATION LIMITATIONS: {participationrestrictions:25113}  PERSONAL FACTORS: {Personal factors:25162} are also affecting patient's functional outcome.   REHAB POTENTIAL: {rehabpotential:25112}  CLINICAL DECISION MAKING: {clinical decision making:25114}  EVALUATION COMPLEXITY: {Evaluation complexity:25115}   GOALS: Goals reviewed with patient? {yes/no:20286}  SHORT TERM GOALS: Target date: *** *** Baseline:  Goal status: INITIAL  2.  *** Baseline:  Goal status: INITIAL  3.  *** Baseline:  Goal status: INITIAL  4.  *** Baseline:  Goal status: INITIAL  5.  *** Baseline:  Goal status: INITIAL  6.  *** Baseline:  Goal status: INITIAL  LONG TERM GOALS: Target date: ***  *** Baseline:  Goal status: INITIAL  2.  *** Baseline:  Goal status: INITIAL  3.  *** Baseline:  Goal status: INITIAL  4.  *** Baseline:  Goal status: INITIAL  5.  *** Baseline:  Goal status: INITIAL  6.  *** Baseline:  Goal status: INITIAL   PLAN:  PT FREQUENCY: {rehab frequency:25116}  PT DURATION: {rehab duration:25117}  PLANNED INTERVENTIONS: {rehab planned interventions:25118::"Therapeutic exercises","Therapeutic activity","Neuromuscular re-education","Balance training","Gait training","Patient/Family education","Self Care","Joint mobilization"}  PLAN FOR NEXT SESSION: ***   Dessie Coma, PT 01/29/2023, 3:58 PM

## 2023-01-30 ENCOUNTER — Other Ambulatory Visit (HOSPITAL_COMMUNITY): Payer: Self-pay

## 2023-01-30 MED ORDER — OZEMPIC (2 MG/DOSE) 8 MG/3ML ~~LOC~~ SOPN
2.0000 mg | PEN_INJECTOR | SUBCUTANEOUS | 0 refills | Status: DC
  Filled 2023-01-30: qty 3, 28d supply, fill #0
  Filled 2023-03-10: qty 3, 28d supply, fill #1
  Filled 2023-05-16: qty 3, 28d supply, fill #2

## 2023-02-03 ENCOUNTER — Encounter: Payer: Self-pay | Admitting: Hematology and Oncology

## 2023-02-04 ENCOUNTER — Encounter (HOSPITAL_BASED_OUTPATIENT_CLINIC_OR_DEPARTMENT_OTHER): Payer: Self-pay | Admitting: Physical Therapy

## 2023-02-04 ENCOUNTER — Ambulatory Visit (HOSPITAL_BASED_OUTPATIENT_CLINIC_OR_DEPARTMENT_OTHER): Payer: Medicare Other | Admitting: Physical Therapy

## 2023-02-04 DIAGNOSIS — M25552 Pain in left hip: Secondary | ICD-10-CM

## 2023-02-04 DIAGNOSIS — R2689 Other abnormalities of gait and mobility: Secondary | ICD-10-CM

## 2023-02-04 DIAGNOSIS — M25652 Stiffness of left hip, not elsewhere classified: Secondary | ICD-10-CM | POA: Diagnosis not present

## 2023-02-04 NOTE — Therapy (Signed)
OUTPATIENT PHYSICAL THERAPY LOWER EXTREMITY EVALUATION   Patient Name: JAIDA BASURTO MRN: [id number] DOB:03-06-1946, 77 y.o., female Today's Date: 02/04/2023  END OF SESSION:  PT End of Session - 02/04/23 1405     Visit Number 2    Number of Visits 16    Date for PT Re-Evaluation 03/26/23    Authorization Type [employer] MCR    PT Start Time 1311   Patient 11 min late   PT Stop Time 1345    PT Time Calculation (min) 34 min    Activity Tolerance Patient tolerated treatment well    Behavior During Therapy WFL for tasks assessed/performed             Past Medical History:  Diagnosis Date   Acute hypoxemic respiratory failure (HCC) 04/24/2017   Anxiety    Arthritis    "back" (04/24/2017)   Atherosclerosis of coronary artery    Blurry vision, left eye    "YAG on the left; to be repaired" (04/24/2017)   Chondromalacia of knee    right knee   Chronic bronchitis (HCC)    Chronic lower back pain    DDD (degenerative disc disease) 07/24/2011   Osteoarthritis-s/p LTKA 0'86, now right planned   Depression 07/24/2011   tx. depression only   DOE (dyspnea on exertion)    Fatty liver    History of diverticulitis 02/2018   Hx of adenomatous polyp of colon 03/21/2014   Hyperlipidemia    Hypertension    IBS (irritable bowel syndrome)    OSA on CPAP 12/07/2016   Pneumonia 1990s X 1   SOB (shortness of breath)    Vitamin D deficiency    Past Surgical History:  Procedure Laterality Date   APPENDECTOMY  1967   BOTOX INJECTION     "face"   CATARACT EXTRACTION W/ INTRAOCULAR LENS IMPLANT Bilateral 2000s   CESAREAN SECTION  1972; 1975   COLONOSCOPY W/ BIOPSIES AND POLYPECTOMY  "a few"   DILATION AND CURETTAGE OF UTERUS     HYSTEROSCOPY WITH D & C  08/29/2006   and resection of endometrial polyp/notes 09/10/2010   IR IMAGING GUIDED PORT INSERTION  01/18/2021   JOINT REPLACEMENT     KNEE ARTHROSCOPY Bilateral    NASAL SEPTUM SURGERY  1972   OOPHORECTOMY Right 1994   TOTAL KNEE  ARTHROPLASTY  08/05/2011   Procedure: TOTAL KNEE ARTHROPLASTY;  Surgeon: Loanne Drilling, MD;  Location: [facility];  Service: Orthopedics;  Laterality: Right;   TOTAL KNEE ARTHROPLASTY Left 05/2009   TUBAL LIGATION  1975   Patient Active Problem List   Diagnosis Date Noted   Iron overload 06/20/2022   Dyspnea 03/25/2022   Port-A-Cath in place 08/27/2021   Macrocytosis without anemia 08/30/2020   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   Cough 05/14/2017   ETOH abuse 04/27/2017   Abnormal LFTs 04/26/2017   Thrombocytopenia (HCC) 04/26/2017   Dyspnea on exertion 03/18/2017   Morbid obesity (HCC) 03/18/2017   Obstructive sleep apnea 12/07/2016   Essential hypertension 12/07/2016   Hx of adenomatous polyp of colon 03/21/2014   Postop Hyponatremia 08/07/2011   OA (osteoarthritis) of knee 08/05/2011   Pain in limb 05/15/2011    PCP: [name] MD   REFERRING PROVIDER: Eartha Inch PA  REFERRING DIAG:  Diagnosis  M25.552 (ICD-10-CM) - Pain in left hip    THERAPY DIAG:  Pain in left hip  Other abnormalities of gait and mobility  Stiffness of left hip, not elsewhere classified  Rationale for  Evaluation and Treatment: Rehabilitation  ONSET DATE: about 3 to 4 months she began having increased pain.   SUBJECTIVE:   SUBJECTIVE STATEMENT: The patient reports she ahs been able to do her exercises except hip abduction. She continues to have pain. She is using a cane.    Eval The patient had an acute onset of left  hip pain about 3-4 months ago. She has increased pain with standing and walking. She had an injection a few weeks ago which helped for a short period of time.    PERTINENT HISTORY: DDD, chronic low back pain, shortness of breath, shoulder pain  PAIN:  Are you having pain? Yes: NPRS scale: 3-4/10 Pain location: left lateral hip  Pain description: aching  Aggravating factors: standing and walking  Relieving factors: rest/ ice   PRECAUTIONS: None  RED  FLAGS: None   WEIGHT BEARING RESTRICTIONS: No  FALLS:  Has patient fallen in last 6 months? No  LIVING ENVIRONMENT: 2 steps into the front door other wise 1 level OCCUPATION:  Retired   Hobbies:  Nothing that the hip is preventing her from doing Likes to dance    PLOF: Independent  PATIENT GOALS:     NEXT MD VISIT:   OBJECTIVE:  Note: Objective measures were completed at Evaluation unless otherwise noted.  DIAGNOSTIC FINDINGS:  Left knee: (-)   PATIENT SURVEYS:  FOTO    COGNITION: Overall cognitive status: Within functional limits for tasks assessed     SENSATION: WFL     POSTURE: No Significant postural limitations  PALPATION: Tender to palpation in the lateral hip around the brusa  LOWER EXTREMITY ROM:  Passive ROM Right eval Left eval  Hip flexion Painful at end range   Hip extension    Hip abduction    Hip adduction    Hip internal rotation No pain   Hip external rotation No pain    Knee flexion    Knee extension    Ankle dorsiflexion    Ankle plantarflexion    Ankle inversion    Ankle eversion     (Blank rows = not tested)  LOWER EXTREMITY MMT:  MMT Right eval Left eval  Hip flexion 16.3 10.3  Hip extension    Hip abduction 19.8 16.9  Hip adduction    Hip internal rotation    Hip external rotation    Knee flexion    Knee extension 31 26.9  Ankle dorsiflexion    Ankle plantarflexion    Ankle inversion    Ankle eversion     (Blank rows = not tested)   GAIT: Significant lateral movement away from the left hip. Trendelenburg gait. Significant improvement in gait with a cane on the right side  TODAY'S TREATMENT:                                                                                                                              DATE:   10/15 Trigger Point  Dry-Needling  Treatment instructions: Expect mild to moderate muscle soreness. S/S of pneumothorax if dry needled over a lung field, and to seek immediate medical  attention should they occur. Patient verbalized understanding of these instructions and education.  Patient Consent Given: Yes Education handout provided: Yes Muscles treated: right glute med x3 right glut min 2x  Electrical stimulation performed: No Parameters: N/A Treatment response/outcome: great twitch   Eval:Access Code: [email or web address] URL: [email or web address] Date: 01/30/2023 Prepared by: Lorayne Bender  Exercises - Supine Piriformis Stretch with Foot on Ground  - 1 x daily - 7 x weekly - 3 sets - 3 reps - 20sec  hold - Supine Lower Trunk Rotation  - 1 x daily - 7 x weekly - 3 sets - 10 reps - Theracane Over Shoulder  - 1 x daily - 7 x weekly - 3 sets - 10 reps - Seated Hip Abduction with Resistance  - 1 x daily - 7 x weekly - 3 sets - 10 reps  Manual: trigger point release to left gluteal in right side lying; reviewed of self soft tissue mobilization   PATIENT EDUCATION:  Education details: HEP, symptom management  Person educated: Patient Education method: Explanation, Demonstration, Tactile cues, Verbal cues, and Handouts Education comprehension: verbalized understanding, returned demonstration, verbal cues required, tactile cues required, and needs further education  HOME EXERCISE PROGRAM: Access Code: [email or web address] URL: [email or web address] Date: 01/30/2023 Prepared by: Lorayne Bender  Exercises - Supine Piriformis Stretch with Foot on Ground  - 1 x daily - 7 x weekly - 3 sets - 3 reps - 20sec  hold - Supine Lower Trunk Rotation  - 1 x daily - 7 x weekly - 3 sets - 10 reps - Theracane Over Shoulder  - 1 x daily - 7 x weekly - 3 sets - 10 reps - Seated Hip Abduction with Resistance  - 1 x daily - 7 x weekly - 3 sets - 10 reps  ASSESSMENT:  CLINICAL IMPRESSION: Therapy was somewhat limited by time today. We focused on manual therapy to reduce significant muscle spasming in the left gluteal and IT band. She had a good twtich response with needling.  We also performed a trial of LAD. Per palpation she appears to have an improvement in trigger points compared to last time. She was encouraged to continue with her self soft tissue mobilization and integrate the exercise in  as she goes along. We will add to her exercises next visit.    Eval:Patient is a 77 year old female with progressive onset of left hip pain.  She has significant spasming in her glutes and down her IT band.  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  She has an antalgic gait.  She had improved pain with use of a cane.  She has a cane at home and plans to start using it for short-term.  She has weakness on the left compared to right.  She would benefit from skilled therapy to improve ability to ambulate and perform daily tasks without increased pain. OBJECTIVE IMPAIRMENTS: Abnormal gait, decreased activity tolerance, difficulty walking, decreased ROM, decreased strength, and pain.   ACTIVITY LIMITATIONS: carrying, bending, standing, squatting, stairs, transfers, and locomotion level  PARTICIPATION LIMITATIONS: meal prep, cleaning, driving, shopping, community activity, and yard work  PERSONAL FACTORS: 1-2 comorbidities: baseline left knee pain; baseline shoulder issues   are also affecting patient's functional outcome.   REHAB POTENTIAL: Good  CLINICAL DECISION MAKING: Evolving/moderate complexity  EVALUATION COMPLEXITY: Moderate  GOALS: Goals reviewed with patient? Yes  SHORT TERM GOALS: Target date: 02/27/2023   Patient will increase gross left lower extremity strength by 5 pounds Baseline: Goal status: INITIAL  2.  Patient will ambulate 300 feet with least restrictive assistive device without increased pain Baseline:  Goal status: INITIAL  3.  Patient will report a 50% reduction in tenderness to palpation of the left gluteal Baseline:  Goal status: INITIAL  4.  Patient will be patient will be independent with basic  HEP Baseline:  Goal status: INITIAL  LONG TERM GOALS: Target date: 03/27/2023    Patient will walk community distances without increased pain Baseline:  Goal status: INITIAL  2.  Patient will go up and down 6 steps with reciprocal gait without increased pain in order to improve community access Baseline:  Goal status: INITIAL  3.  Patient will have complete exercise program to continue progressive strengthening of left hip Baseline:  Goal status: INITIAL  PLAN:  PT FREQUENCY: 2x/week  PT DURATION: 8 weeks  PLANNED INTERVENTIONS: Therapeutic exercises, Therapeutic activity, Neuromuscular re-education, Balance training, Gait training, Patient/Family education, Self Care, Joint mobilization, Stair training, DME instructions, Aquatic Therapy, Dry Needling, Electrical stimulation, Cryotherapy, Moist heat, Taping, Manual therapy, and Re-evaluation.   PLAN FOR NEXT SESSION: Consider trigger point dry needling to left glute. Consider continued soft tissue mobilization to the left gluteal advance exercise program.  Consider either supine or seated series of exercises.  Progressed to standing exercises tolerated.  Consider aquatic therapy for left hip strengthening and gait training.  Review HEP.   Dessie Coma, PT 02/04/2023, 2:08 PM

## 2023-02-06 DIAGNOSIS — M25552 Pain in left hip: Secondary | ICD-10-CM | POA: Diagnosis not present

## 2023-02-11 DIAGNOSIS — L2989 Other pruritus: Secondary | ICD-10-CM | POA: Diagnosis not present

## 2023-02-11 DIAGNOSIS — L821 Other seborrheic keratosis: Secondary | ICD-10-CM | POA: Diagnosis not present

## 2023-02-11 DIAGNOSIS — D2271 Melanocytic nevi of right lower limb, including hip: Secondary | ICD-10-CM | POA: Diagnosis not present

## 2023-02-11 DIAGNOSIS — L720 Epidermal cyst: Secondary | ICD-10-CM | POA: Diagnosis not present

## 2023-02-13 ENCOUNTER — Ambulatory Visit (HOSPITAL_BASED_OUTPATIENT_CLINIC_OR_DEPARTMENT_OTHER): Payer: Medicare Other | Admitting: Physical Therapy

## 2023-02-13 ENCOUNTER — Encounter (HOSPITAL_BASED_OUTPATIENT_CLINIC_OR_DEPARTMENT_OTHER): Payer: Self-pay

## 2023-02-17 ENCOUNTER — Encounter: Payer: Self-pay | Admitting: Primary Care

## 2023-02-17 ENCOUNTER — Ambulatory Visit: Payer: Medicare Other | Admitting: Primary Care

## 2023-02-17 VITALS — BP 136/85 | HR 65 | Ht 60.0 in | Wt 214.4 lb

## 2023-02-17 DIAGNOSIS — G4733 Obstructive sleep apnea (adult) (pediatric): Secondary | ICD-10-CM | POA: Diagnosis not present

## 2023-02-17 NOTE — Progress Notes (Signed)
@Patient  ID: Michelle [name], female    DOB: Feb 12, 1946, 77 y.o.   MRN: [id number]  Chief Complaint  Patient presents with   Follow-up    Cpap f/u pt denies any concerns.    Referring provider: [name], MD  HPI: 77 year old, former smoker. PMH former smoker quit in 2016.  Past medical history significant for pretension, OSA, dyspnea on exertion, anemia, thrombocytopenia, obesity.  Former patient of Dr. Craige Cotta. PSG 11/03/16 >> AHI 27, SpO2 low 82%   02/17/2023- Interim hx  Patient presents today for annual follow-up OSA. She is moderately compliant with CPAP use, she was having issues with airleaks. Needing prescription order to renew CPAP supplies. She uses nasal pillow mask. Machine is working fine. She reports significant benefit from CPAP use, she feels well rested when wearing and reports sleep is not as disrupted.  He reports improvement in dyspnea with weight loss.  Is not required albuterol use in the last several months.  She is planning an upcoming river trip in December to [place].  She plans to take CPAP and albuterol rescue inhaler with her. DME company is [employer].   [facility] download 12/19/2022 - 02/16/2023 Usage days 27/60 days (45%) Average usage days used 6 hours 48 minutes Pressure 5 to 20 cm H2O (11.4 cm H2O-95%) Air leaks 42.8 L/min (95%) AHI 5.7  No Known Allergies  Immunization History  Administered Date(s) Administered   Hepatitis B, ADULT 09/17/2017, 10/15/2017   Influenza Split 02/17/2009, 01/20/2017   Influenza, Quadrivalent, Recombinant, Inj, Pf 01/24/2020   Influenza,inj,Quad PF,6+ Mos 01/15/2011, 02/20/2018, 02/15/2019, 12/20/2019   Influenza-Unspecified 12/23/2019   PFIZER Comirnaty(Gray Top)Covid-19 Tri-Sucrose Vaccine 07/03/2020   PFIZER(Purple Top)SARS-COV-2 Vaccination 06/17/2019, 07/13/2019   Pneumococcal Conjugate-13 05/24/2016   Pneumococcal Polysaccharide-23 01/31/2012   Tdap 02/17/2009, 01/31/2012   Zoster Recombinant(Shingrix) 01/28/2018    Zoster, Live 01/28/2018, 04/01/2018    Past Medical History:  Diagnosis Date   Acute hypoxemic respiratory failure (HCC) 04/24/2017   Anxiety    Arthritis    "back" (04/24/2017)   Atherosclerosis of coronary artery    Blurry vision, left eye    "YAG on the left; to be repaired" (04/24/2017)   Chondromalacia of knee    right knee   Chronic bronchitis (HCC)    Chronic lower back pain    DDD (degenerative disc disease) 07/24/2011   Osteoarthritis-s/p LTKA 3'29, now right planned   Depression 07/24/2011   tx. depression only   DOE (dyspnea on exertion)    Fatty liver    History of diverticulitis 02/2018   Hx of adenomatous polyp of colon 03/21/2014   Hyperlipidemia    Hypertension    IBS (irritable bowel syndrome)    OSA on CPAP 12/07/2016   Pneumonia 1990s X 1   SOB (shortness of breath)    Vitamin D deficiency     Tobacco History: Social History   Tobacco Use  Smoking Status Former   Current packs/day: 0.00   Average packs/day: 0.1 packs/day for 36.0 years (4.3 ttl pk-yrs)   Types: Cigarettes   Start date: 06/19/1978   Quit date: 06/19/2014   Years since quitting: 8.6  Smokeless Tobacco Never   Counseling given: Not Answered   Outpatient Medications Prior to Visit  Medication Sig Dispense Refill   ALPRAZolam (NIRAVAM) 0.25 MG dissolvable tablet Take 0.25 mg by mouth at bedtime as needed for anxiety.     lidocaine-prilocaine (EMLA) cream Apply 1 application topically as needed. 30 g 0   losartan (COZAAR) 25  MG tablet Take 25 mg by mouth daily.     Semaglutide, 2 MG/DOSE, (OZEMPIC, 2 MG/DOSE,) 8 MG/3ML SOPN Inject 2 mg into the skin once a week. 3 mL 5   Semaglutide, 2 MG/DOSE, (OZEMPIC, 2 MG/DOSE,) 8 MG/3ML SOPN Inject 2 mg into the skin once a week. 9 mL 1   Semaglutide, 2 MG/DOSE, (OZEMPIC, 2 MG/DOSE,) 8 MG/3ML SOPN Inject 2 mg into the skin once a week. 9 mL 0   sertraline (ZOLOFT) 100 MG tablet Take 100 mg by mouth 2 (two) times a day.   0   traMADol (ULTRAM)  50 MG tablet Take 50 mg by mouth every 6 (six) hours as needed for moderate pain or severe pain.     Facility-Administered Medications Prior to Visit  Medication Dose Route Frequency Provider Last Rate Last Admin   sodium chloride flush (NS) 0.9 % injection 10 mL  10 mL Intravenous PRN Rachel Moulds, MD   10 mL at 12/31/21 1536   Review of Systems  Review of Systems  Constitutional: Negative.   HENT: Negative.    Respiratory: Negative.  Negative for cough.      Physical Exam  BP 136/85   Pulse 65   Ht 5' (1.524 m)   Wt 214 lb 6.4 oz (97.3 kg)   SpO2 97%   BMI 41.87 kg/m  Physical Exam Constitutional:      Appearance: Normal appearance.  HENT:     Head: Normocephalic and atraumatic.  Cardiovascular:     Rate and Rhythm: Normal rate and regular rhythm.  Pulmonary:     Effort: Pulmonary effort is normal.     Breath sounds: Normal breath sounds.  Neurological:     General: No focal deficit present.     Mental Status: She is alert and oriented to person, place, and time. Mental status is at baseline.  Psychiatric:        Mood and Affect: Mood normal.        Behavior: Behavior normal.        Thought Content: Thought content normal.        Judgment: Judgment normal.      Lab Results:  CBC    Component Value Date/Time   WBC 4.4 01/08/2023 1234   WBC 4.3 09/23/2022 1431   RBC 4.19 01/08/2023 1234   HGB 14.7 01/08/2023 1234   HGB 13.7 10/04/2021 1424   HCT 41.8 01/08/2023 1234   HCT 40.4 10/04/2021 1424   PLT 179 01/08/2023 1234   MCV 99.8 01/08/2023 1234   MCV 99 (H) 02/18/2018 1217   MCH 35.1 (H) 01/08/2023 1234   MCHC 35.2 01/08/2023 1234   RDW 12.1 01/08/2023 1234   RDW 12.1 (L) 02/18/2018 1217   LYMPHSABS 1.4 01/08/2023 1234   LYMPHSABS 1.7 02/18/2018 1217   MONOABS 0.6 01/08/2023 1234   EOSABS 0.1 01/08/2023 1234   EOSABS 0.2 02/18/2018 1217   BASOSABS 0.1 01/08/2023 1234   BASOSABS 0.1 02/18/2018 1217    BMET    Component Value Date/Time   NA  137 01/08/2023 1234   NA 138 10/04/2021 1424   K 4.2 01/08/2023 1234   CL 104 01/08/2023 1234   CO2 24 01/08/2023 1234   GLUCOSE 109 (H) 01/08/2023 1234   BUN 28 (H) 01/08/2023 1234   BUN 21 10/04/2021 1424   CREATININE 1.14 (H) 01/08/2023 1234   CALCIUM 9.1 01/08/2023 1234   GFRNONAA 50 (L) 01/08/2023 1234   GFRAA >60 08/09/2018 1835  BNP    Component Value Date/Time   BNP 13.6 08/29/2021 2045    ProBNP No results found for: "PROBNP"  Imaging: No results found.   Assessment & Plan:   Obstructive sleep apnea - Sleep apnea is well controlled. She is compliant with CPAP use and reports benefit from wearing. She had a couple breaks in therapy over the last month due to mask issues. She uses nasal pillow mask and was experiencing large amount of airleaks. Current pressure 5-20cm h20; residual 5.7/hour. She needs prescription to renew CPAP supplies with [employer]. FU in 1 year or sooner if needed.    Dyspnea - Improved with weight loss. Rare SABA use. Continue Albuterol hfa 2 puffs every 4-6 hours a needed for shortness of breath/wheezing.   Glenford Bayley, NP 02/17/2023

## 2023-02-17 NOTE — Assessment & Plan Note (Signed)
-   Sleep apnea is well controlled. She is compliant with CPAP use and reports benefit from wearing. She had a couple breaks in therapy over the last month due to mask issues. She uses nasal pillow mask and was experiencing large amount of airleaks. Current pressure 5-20cm h20; residual 5.7/hour. She needs prescription to renew CPAP supplies with [employer]. FU in 1 year or sooner if needed.

## 2023-02-17 NOTE — Assessment & Plan Note (Signed)
-   Improved with weight loss. Rare SABA use. Continue Albuterol hfa 2 puffs every 4-6 hours a needed for shortness of breath/wheezing.

## 2023-02-17 NOTE — Patient Instructions (Signed)
Renew CPAP supplies Fu in 1 year

## 2023-02-20 ENCOUNTER — Ambulatory Visit (HOSPITAL_BASED_OUTPATIENT_CLINIC_OR_DEPARTMENT_OTHER): Payer: Medicare Other | Admitting: Physical Therapy

## 2023-02-20 ENCOUNTER — Encounter (HOSPITAL_BASED_OUTPATIENT_CLINIC_OR_DEPARTMENT_OTHER): Payer: Self-pay | Admitting: Physical Therapy

## 2023-02-20 DIAGNOSIS — R2689 Other abnormalities of gait and mobility: Secondary | ICD-10-CM | POA: Diagnosis not present

## 2023-02-20 DIAGNOSIS — M25552 Pain in left hip: Secondary | ICD-10-CM

## 2023-02-20 DIAGNOSIS — M25652 Stiffness of left hip, not elsewhere classified: Secondary | ICD-10-CM

## 2023-02-20 NOTE — Therapy (Signed)
OUTPATIENT PHYSICAL THERAPY LOWER EXTREMITY EVALUATION   Patient Name: Michelle [name] MRN: [id number] DOB:[birth year], 77 y.o., female Today's Date: 02/20/2023  END OF SESSION:  PT End of Session - 02/20/23 1350     Visit Number 3    Number of Visits 16    Date for PT Re-Evaluation 03/26/23    Authorization Type BCBS MCR    PT Start Time 1100    PT Stop Time 1143    PT Time Calculation (min) 43 min    Activity Tolerance Patient tolerated treatment well    Behavior During Therapy WFL for tasks assessed/performed              Past Medical History:  Diagnosis Date   Acute hypoxemic respiratory failure (HCC) 04/24/2017   Anxiety    Arthritis    "back" (04/24/2017)   Atherosclerosis of coronary artery    Blurry vision, left eye    "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 depression only   DOE (dyspnea on exertion)    Fatty liver    History of diverticulitis 02/2018   Hx of adenomatous polyp of colon 03/21/2014   Hyperlipidemia    Hypertension    IBS (irritable bowel syndrome)    OSA on CPAP 12/07/2016   Pneumonia 1990s X 1   SOB (shortness of breath)    Vitamin D deficiency    Past Surgical History:  Procedure Laterality Date   APPENDECTOMY  1967   BOTOX INJECTION     "face"   CATARACT EXTRACTION W/ INTRAOCULAR LENS IMPLANT Bilateral 2000s   CESAREAN SECTION  1972; 1975   COLONOSCOPY W/ BIOPSIES AND POLYPECTOMY  "a few"   DILATION AND CURETTAGE OF UTERUS     HYSTEROSCOPY WITH D & C  08/29/2006   and resection of endometrial polyp/notes 09/10/2010   IR IMAGING GUIDED PORT INSERTION  01/18/2021   JOINT REPLACEMENT     KNEE ARTHROSCOPY Bilateral    NASAL SEPTUM SURGERY  1972   OOPHORECTOMY Right 1994   TOTAL KNEE ARTHROPLASTY  08/05/2011    Procedure: TOTAL KNEE ARTHROPLASTY;  Surgeon: Loanne Drilling, MD;  Location: [facility];  Service: Orthopedics;  Laterality: Right;   TOTAL KNEE ARTHROPLASTY Left 05/2009   TUBAL LIGATION  1975   Patient Active Problem List   Diagnosis Date Noted   Iron overload 06/20/2022   Dyspnea 03/25/2022   Port-A-Cath in place 08/27/2021   Macrocytosis without anemia 08/30/2020   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   Cough 05/14/2017   ETOH abuse 04/27/2017   Abnormal LFTs 04/26/2017   Thrombocytopenia (HCC) 04/26/2017   Dyspnea on exertion 03/18/2017   Morbid obesity (HCC) 03/18/2017   Obstructive sleep apnea 12/07/2016   Essential hypertension 12/07/2016   Hx of adenomatous polyp of colon 03/21/2014   Postop Hyponatremia 08/07/2011   OA (osteoarthritis) of knee 08/05/2011   Pain in limb 05/15/2011    PCP: [name] MD   REFERRING PROVIDER: Eartha Inch PA  REFERRING DIAG:  Diagnosis  M25.552 (ICD-10-CM) - Pain in left hip    THERAPY DIAG:  Pain in left hip  Other abnormalities of gait and mobility  Stiffness of left hip, not elsewhere classified  Rationale for Evaluation and Treatment: Rehabilitation  ONSET DATE: about 3 to 4 months she began having increased pain.   SUBJECTIVE:   SUBJECTIVE STATEMENT: The patient reports her hip continues to be sore. She missed last weeks appointment.. She has increased pain when she is walking. She uses the cane at times.   Eval The patient had an acute onset of left  hip pain about 3-4 months ago. She has increased pain with standing and walking. She had an injection a few weeks ago which helped for a short period of time.    PERTINENT HISTORY: DDD, chronic low back pain, shortness of breath, shoulder pain  PAIN:  Are you having pain? Yes: NPRS scale: 3-4/10 Pain location: left lateral hip  Pain description: aching  Aggravating factors: standing and walking  Relieving factors: rest/ ice   PRECAUTIONS:  None  RED FLAGS: None   WEIGHT BEARING RESTRICTIONS: No  FALLS:  Has patient fallen in last 6 months? No  LIVING ENVIRONMENT: 2 steps into the front door other wise 1 level OCCUPATION:  Retired   Hobbies:  Nothing that the hip is preventing her from doing Likes to dance    PLOF: Independent  PATIENT GOALS:     NEXT MD VISIT:   OBJECTIVE:  Note: Objective measures were completed at Evaluation unless otherwise noted.  DIAGNOSTIC FINDINGS:  Left knee: (-)   PATIENT SURVEYS:  FOTO    COGNITION: Overall cognitive status: Within functional limits for tasks assessed     SENSATION: WFL     POSTURE: No Significant postural limitations  PALPATION: Tender to palpation in the lateral hip around the brusa  LOWER EXTREMITY ROM:  Passive ROM Right eval Left eval  Hip flexion Painful at end range   Hip extension    Hip abduction    Hip adduction    Hip internal rotation No pain   Hip external rotation No pain    Knee flexion    Knee extension    Ankle dorsiflexion    Ankle plantarflexion    Ankle inversion    Ankle eversion     (Blank rows = not tested)  LOWER EXTREMITY MMT:  MMT Right eval Left eval  Hip flexion 16.3 10.3  Hip extension    Hip abduction 19.8 16.9  Hip adduction    Hip internal rotation    Hip external rotation    Knee flexion    Knee extension 31 26.9  Ankle dorsiflexion    Ankle plantarflexion    Ankle inversion    Ankle eversion     (Blank rows = not tested)   GAIT: Significant lateral movement away from the left hip. Trendelenburg gait. Significant improvement in gait with a cane on the right side  TODAY'S TREATMENT:                                                                                                                              DATE:     10/31 Trigger Point  Dry-Needling  Treatment instructions: Expect mild to moderate muscle soreness. S/S of pneumothorax if dry needled over a lung field, and to seek  immediate medical attention should they occur. Patient verbalized understanding of these instructions and education.  Patient Consent Given: Yes Education handout provided: Yes Muscles treated: right glute med x3 right glut min 2x  Electrical stimulation performed: No Parameters: N/A Treatment response/outcome: great twitch   Manual" skilled palpation of trigger point   Hip abdcution 3x15 red  LTR 2x15  Gluteal stretch 3x20 sec hold  LAQ 2x15 red   10/15 Trigger Point Dry-Needling  Treatment instructions: Expect mild to moderate muscle soreness. S/S of pneumothorax if dry needled over a lung field, and to seek immediate medical attention should they occur. Patient verbalized understanding of these instructions and education.  Patient Consent Given: Yes Education handout provided: Yes Muscles treated: right glute med x3 right glut min 2x  Electrical stimulation performed: No Parameters: N/A Treatment response/outcome: great twitch   Eval:Access Code: [email or web address] URL: [email or web address] Date: 01/30/2023 Prepared by: Lorayne Bender  Exercises - Supine Piriformis Stretch with Foot on Ground  - 1 x daily - 7 x weekly - 3 sets - 3 reps - 20sec  hold - Supine Lower Trunk Rotation  - 1 x daily - 7 x weekly - 3 sets - 10 reps - Theracane Over Shoulder  - 1 x daily - 7 x weekly - 3 sets - 10 reps - Seated Hip Abduction with Resistance  - 1 x daily - 7 x weekly - 3 sets - 10 reps  Manual: trigger point release to left gluteal in right side lying; reviewed of self soft tissue mobilization   PATIENT EDUCATION:  Education details: HEP, symptom management  Person educated: Patient Education method: Explanation, Demonstration, Tactile cues, Verbal cues, and Handouts Education comprehension: verbalized understanding, returned demonstration, verbal cues required, tactile cues required, and needs further education  HOME EXERCISE PROGRAM: Access Code: [email or web address] URL:  [email or web address] Date: 01/30/2023 Prepared by: Lorayne Bender  Exercises - Supine Piriformis Stretch with Foot on Ground  - 1 x daily - 7 x weekly - 3 sets - 3 reps - 20sec  hold - Supine Lower Trunk Rotation  - 1 x daily - 7 x weekly - 3 sets - 10 reps - Theracane Over Shoulder  - 1 x daily - 7 x weekly - 3 sets - 10 reps - Seated Hip Abduction with Resistance  - 1 x daily - 7 x weekly - 3 sets - 10 reps  ASSESSMENT:  CLINICAL IMPRESSION: Therapy continues to focus on manual therapy and trigger point dry needling to reduce inflammation hip.  She continues to have significant trigger points in hip gluteal and IT band.  She reported it felt better after trigger point dry needling and manual therapy.  We reviewed stretches and exercises to perform at home.  At this point we will consider aquatic therapy to improve general strengthening without increased pain.  Basic exercises at this point are causing pain.  Eval:Patient is a 77 year old female with progressive onset of left hip pain.  She has significant spasming in her glutes and down her IT band.  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  She has an antalgic gait.  She had improved pain with use of a cane.  She has a cane at home and plans to start using it for short-term.  She has weakness on the left compared to right.  She would benefit  from skilled therapy to improve ability to ambulate and perform daily tasks without increased pain. OBJECTIVE IMPAIRMENTS: Abnormal gait, decreased activity tolerance, difficulty walking, decreased ROM, decreased strength, and pain.   ACTIVITY LIMITATIONS: carrying, bending, standing, squatting, stairs, transfers, and locomotion level  PARTICIPATION LIMITATIONS: meal prep, cleaning, driving, shopping, community activity, and yard work  PERSONAL FACTORS: 1-2 comorbidities: baseline left knee pain; baseline shoulder issues   are also affecting patient's  functional outcome.   REHAB POTENTIAL: Good  CLINICAL DECISION MAKING: Evolving/moderate complexity  EVALUATION COMPLEXITY: Moderate   GOALS: Goals reviewed with patient? Yes  SHORT TERM GOALS: Target date: 02/27/2023   Patient will increase gross left lower extremity strength by 5 pounds Baseline: Goal status: INITIAL  2.  Patient will ambulate 300 feet with least restrictive assistive device without increased pain Baseline:  Goal status: INITIAL  3.  Patient will report a 50% reduction in tenderness to palpation of the left gluteal Baseline:  Goal status: INITIAL  4.  Patient will be patient will be independent with basic HEP Baseline:  Goal status: INITIAL  LONG TERM GOALS: Target date: 03/27/2023    Patient will walk community distances without increased pain Baseline:  Goal status: INITIAL  2.  Patient will go up and down 6 steps with reciprocal gait without increased pain in order to improve community access Baseline:  Goal status: INITIAL  3.  Patient will have complete exercise program to continue progressive strengthening of left hip Baseline:  Goal status: INITIAL  PLAN:  PT FREQUENCY: 2x/week  PT DURATION: 8 weeks  PLANNED INTERVENTIONS: Therapeutic exercises, Therapeutic activity, Neuromuscular re-education, Balance training, Gait training, Patient/Family education, Self Care, Joint mobilization, Stair training, DME instructions, Aquatic Therapy, Dry Needling, Electrical stimulation, Cryotherapy, Moist heat, Taping, Manual therapy, and Re-evaluation.   PLAN FOR NEXT SESSION: Consider trigger point dry needling to left glute. Consider continued soft tissue mobilization to the left gluteal advance exercise program.  Consider either supine or seated series of exercises.  Progressed to standing exercises tolerated.  Consider aquatic therapy for left hip strengthening and gait training.  Review HEP.   Dessie Coma, PT 02/20/2023, 1:51 PM

## 2023-02-25 ENCOUNTER — Encounter (HOSPITAL_BASED_OUTPATIENT_CLINIC_OR_DEPARTMENT_OTHER): Payer: Self-pay | Admitting: Physical Therapy

## 2023-02-25 ENCOUNTER — Ambulatory Visit (HOSPITAL_BASED_OUTPATIENT_CLINIC_OR_DEPARTMENT_OTHER): Payer: Medicare Other | Attending: Student | Admitting: Physical Therapy

## 2023-02-25 DIAGNOSIS — R2689 Other abnormalities of gait and mobility: Secondary | ICD-10-CM | POA: Diagnosis not present

## 2023-02-25 DIAGNOSIS — M25652 Stiffness of left hip, not elsewhere classified: Secondary | ICD-10-CM

## 2023-02-25 DIAGNOSIS — M25552 Pain in left hip: Secondary | ICD-10-CM

## 2023-02-25 NOTE — Therapy (Signed)
OUTPATIENT PHYSICAL THERAPY LOWER EXTREMITY EVALUATION   Patient Name: Michelle [name] MRN: [id number] DOB:Nov 10, 1945, 77 y.o., female Today's Date: 02/25/2023  END OF SESSION:  PT End of Session - 02/25/23 1443     Visit Number 4    Number of Visits 16    Date for PT Re-Evaluation 03/26/23    Authorization Type BCBS MCR    PT Start Time 1500    PT Stop Time 1542    PT Time Calculation (min) 42 min    Activity Tolerance Patient tolerated treatment well    Behavior During Therapy WFL for tasks assessed/performed              Past Medical History:  Diagnosis Date   Acute hypoxemic respiratory failure (HCC) 04/24/2017   Anxiety    Arthritis    "back" (04/24/2017)   Atherosclerosis of coronary artery    Blurry vision, left eye    "YAG on the left; to be repaired" (04/24/2017)   Chondromalacia of knee    right knee   Chronic bronchitis (HCC)    Chronic lower back pain    DDD (degenerative disc disease) 07/24/2011   Osteoarthritis-s/p LTKA 7'84, now right planned   Depression 07/24/2011   tx. depression only   DOE (dyspnea on exertion)    Fatty liver    History of diverticulitis 02/2018   Hx of adenomatous polyp of colon 03/21/2014   Hyperlipidemia    Hypertension    IBS (irritable bowel syndrome)    OSA on CPAP 12/07/2016   Pneumonia 1990s X 1   SOB (shortness of breath)    Vitamin D deficiency    Past Surgical History:  Procedure Laterality Date   APPENDECTOMY  1967   BOTOX INJECTION     "face"   CATARACT EXTRACTION W/ INTRAOCULAR LENS IMPLANT Bilateral 2000s   CESAREAN SECTION  1972; 1975   COLONOSCOPY W/ BIOPSIES AND POLYPECTOMY  "a few"   DILATION AND CURETTAGE OF UTERUS     HYSTEROSCOPY WITH D & C  08/29/2006   and resection of endometrial polyp/notes 09/10/2010   IR IMAGING GUIDED PORT INSERTION  01/18/2021   JOINT REPLACEMENT     KNEE ARTHROSCOPY Bilateral    NASAL SEPTUM SURGERY  1972   OOPHORECTOMY Right 1994   TOTAL KNEE ARTHROPLASTY  08/05/2011    Procedure: TOTAL KNEE ARTHROPLASTY;  Surgeon: Loanne Drilling, MD;  Location: [facility];  Service: Orthopedics;  Laterality: Right;   TOTAL KNEE ARTHROPLASTY Left June 07, 2009   TUBAL LIGATION  1975   Patient Active Problem List   Diagnosis Date Noted   Iron overload 06/20/2022   Dyspnea 03/25/2022   Port-A-Cath in place 08/27/2021   Macrocytosis without anemia 08/30/2020   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   Cough 05/14/2017   ETOH abuse 04/27/2017   Abnormal LFTs 04/26/2017   Thrombocytopenia (HCC) 04/26/2017   Dyspnea on exertion 03/18/2017   Morbid obesity (HCC) 03/18/2017   Obstructive sleep apnea 12/07/2016   Essential hypertension 12/07/2016   Hx of adenomatous polyp of colon 03/21/2014   Postop Hyponatremia 08/07/2011   OA (osteoarthritis) of knee 08/05/2011   Pain in limb 05/15/2011    PCP: [name] MD   REFERRING PROVIDER: Eartha Inch PA  REFERRING DIAG:  Diagnosis  M25.552 (ICD-10-CM) - Pain in left hip    THERAPY DIAG:  Pain in left hip  Other abnormalities of gait and mobility  Stiffness of left hip, not elsewhere classified  Rationale for Evaluation and Treatment: Rehabilitation  ONSET DATE: about 3 to 4 months she began having increased pain.   SUBJECTIVE:   SUBJECTIVE STATEMENT: Th patient reports her pain level continues to trend in the wrong direction. She is having increased pain when she stands and walks.    Eval The patient had an acute onset of left  hip pain about 3-4 months ago. She has increased pain with standing and walking. She had an injection a few weeks ago which helped for a short period of time.    PERTINENT HISTORY: DDD, chronic low back pain, shortness of breath, shoulder pain  PAIN:  Are you having pain? Yes: NPRS scale: 7/10 Pain location: left lateral hip  Pain description: aching  Aggravating factors: standing and walking  Relieving factors: rest/ ice   PRECAUTIONS: None  RED  FLAGS: None   WEIGHT BEARING RESTRICTIONS: No  FALLS:  Has patient fallen in last 6 months? No  LIVING ENVIRONMENT: 2 steps into the front door other wise 1 level OCCUPATION:  Retired   Hobbies:  Nothing that the hip is preventing her from doing Likes to dance    PLOF: Independent  PATIENT GOALS:     NEXT MD VISIT:   OBJECTIVE:  Note: Objective measures were completed at Evaluation unless otherwise noted.  DIAGNOSTIC FINDINGS:  Left knee: (-)   PATIENT SURVEYS:  FOTO    COGNITION: Overall cognitive status: Within functional limits for tasks assessed     SENSATION: WFL     POSTURE: No Significant postural limitations  PALPATION: Tender to palpation in the lateral hip around the brusa  LOWER EXTREMITY ROM:  Passive ROM Right eval Left eval  Hip flexion Painful at end range   Hip extension    Hip abduction    Hip adduction    Hip internal rotation No pain   Hip external rotation No pain    Knee flexion    Knee extension    Ankle dorsiflexion    Ankle plantarflexion    Ankle inversion    Ankle eversion     (Blank rows = not tested)  LOWER EXTREMITY MMT:  MMT Right eval Left eval  Hip flexion 16.3 10.3  Hip extension    Hip abduction 19.8 16.9  Hip adduction    Hip internal rotation    Hip external rotation    Knee flexion    Knee extension 31 26.9  Ankle dorsiflexion    Ankle plantarflexion    Ankle inversion    Ankle eversion     (Blank rows = not tested)   GAIT: Significant lateral movement away from the left hip. Trendelenburg gait. Significant improvement in gait with a cane on the right side  TODAY'S TREATMENT:                                                                                                                              DATE:   11/5 Hip abdcution 3x10 red  LTR 2x15  Ball roll out x10 5 sec hold  Lateral 5x 10 sec hold each  Ball press 2x10   Bilateral er yellow      10/31 Trigger Point  Dry-Needling  Treatment instructions: Expect mild to moderate muscle soreness. S/S of pneumothorax if dry needled over a lung field, and to seek immediate medical attention should they occur. Patient verbalized understanding of these instructions and education.  Patient Consent Given: Yes Education handout provided: Yes Muscles treated: right glute med x3 right glut min 2x  Electrical stimulation performed: No Parameters: N/A Treatment response/outcome: great twitch   Manual" skilled palpation of trigger point   Hip abdcution 3x15 red  LTR 2x15  Gluteal stretch 3x20 sec hold  LAQ 2x15 red   10/15 Trigger Point Dry-Needling  Treatment instructions: Expect mild to moderate muscle soreness. S/S of pneumothorax if dry needled over a lung field, and to seek immediate medical attention should they occur. Patient verbalized understanding of these instructions and education.  Patient Consent Given: Yes Education handout provided: Yes Muscles treated: right glute med x3 right glut min 2x  Electrical stimulation performed: No Parameters: N/A Treatment response/outcome: great twitch   Eval:Access Code: [email or web address] URL: [email or web address] Date: 01/30/2023 Prepared by: Lorayne Bender  Exercises - Supine Piriformis Stretch with Foot on Ground  - 1 x daily - 7 x weekly - 3 sets - 3 reps - 20sec  hold - Supine Lower Trunk Rotation  - 1 x daily - 7 x weekly - 3 sets - 10 reps - Theracane Over Shoulder  - 1 x daily - 7 x weekly - 3 sets - 10 reps - Seated Hip Abduction with Resistance  - 1 x daily - 7 x weekly - 3 sets - 10 reps  Manual: trigger point release to left gluteal in right side lying; reviewed of self soft tissue mobilization   PATIENT EDUCATION:  Education details: HEP, symptom management  Person educated: Patient Education method: Explanation, Demonstration, Tactile cues, Verbal cues, and Handouts Education comprehension: verbalized understanding, returned  demonstration, verbal cues required, tactile cues required, and needs further education  HOME EXERCISE PROGRAM: Access Code: [email or web address] URL: [email or web address] Date: 01/30/2023 Prepared by: Lorayne Bender  Exercises - Supine Piriformis Stretch with Foot on Ground  - 1 x daily - 7 x weekly - 3 sets - 3 reps - 20sec  hold - Supine Lower Trunk Rotation  - 1 x daily - 7 x weekly - 3 sets - 10 reps - Theracane Over Shoulder  - 1 x daily - 7 x weekly - 3 sets - 10 reps - Seated Hip Abduction with Resistance  - 1 x daily - 7 x weekly - 3 sets - 10 reps  ASSESSMENT:  CLINICAL IMPRESSION: The patient was very limited today. She had no improvement with needling last visit so we did not perform this visit. She had significant sensitivity to palpation and light touch. We attempted light exercises which slightly exacerbated her pain. We also tried the ball roll. She had a minor increase in pain with this. She was advised to try to use a cane over the next few days to take the edge off her pain.     Eval:Patient is a 77 year old female with progressive onset of left hip pain.  She has significant spasming in her glutes and down her IT band.  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  She has an antalgic gait.  She had improved pain with use of a  cane.  She has a cane at home and plans to start using it for short-term.  She has weakness on the left compared to right.  She would benefit from skilled therapy to improve ability to ambulate and perform daily tasks without increased pain. OBJECTIVE IMPAIRMENTS: Abnormal gait, decreased activity tolerance, difficulty walking, decreased ROM, decreased strength, and pain.   ACTIVITY LIMITATIONS: carrying, bending, standing, squatting, stairs, transfers, and locomotion level  PARTICIPATION LIMITATIONS: meal prep, cleaning, driving, shopping, community activity, and yard work  PERSONAL FACTORS: 1-2 comorbidities:  baseline left knee pain; baseline shoulder issues   are also affecting patient's functional outcome.   REHAB POTENTIAL: Good  CLINICAL DECISION MAKING: Evolving/moderate complexity  EVALUATION COMPLEXITY: Moderate   GOALS: Goals reviewed with patient? Yes  SHORT TERM GOALS: Target date: 02/27/2023   Patient will increase gross left lower extremity strength by 5 pounds Baseline: Goal status: INITIAL  2.  Patient will ambulate 300 feet with least restrictive assistive device without increased pain Baseline:  Goal status: INITIAL  3.  Patient will report a 50% reduction in tenderness to palpation of the left gluteal Baseline:  Goal status: INITIAL  4.  Patient will be patient will be independent with basic HEP Baseline:  Goal status: INITIAL  LONG TERM GOALS: Target date: 03/27/2023    Patient will walk community distances without increased pain Baseline:  Goal status: INITIAL  2.  Patient will go up and down 6 steps with reciprocal gait without increased pain in order to improve community access Baseline:  Goal status: INITIAL  3.  Patient will have complete exercise program to continue progressive strengthening of left hip Baseline:  Goal status: INITIAL  PLAN:  PT FREQUENCY: 2x/week  PT DURATION: 8 weeks  PLANNED INTERVENTIONS: Therapeutic exercises, Therapeutic activity, Neuromuscular re-education, Balance training, Gait training, Patient/Family education, Self Care, Joint mobilization, Stair training, DME instructions, Aquatic Therapy, Dry Needling, Electrical stimulation, Cryotherapy, Moist heat, Taping, Manual therapy, and Re-evaluation.   PLAN FOR NEXT SESSION: Consider trigger point dry needling to left glute. Consider continued soft tissue mobilization to the left gluteal advance exercise program.  Consider either supine or seated series of exercises.  Progressed to standing exercises tolerated.  Consider aquatic therapy for left hip strengthening and gait  training.  Review HEP.   Dessie Coma, PT 02/25/2023, 3:01 PM

## 2023-02-28 ENCOUNTER — Encounter (HOSPITAL_BASED_OUTPATIENT_CLINIC_OR_DEPARTMENT_OTHER): Payer: Self-pay | Admitting: Physical Therapy

## 2023-02-28 ENCOUNTER — Ambulatory Visit (HOSPITAL_BASED_OUTPATIENT_CLINIC_OR_DEPARTMENT_OTHER): Payer: Medicare Other | Admitting: Physical Therapy

## 2023-02-28 DIAGNOSIS — M25652 Stiffness of left hip, not elsewhere classified: Secondary | ICD-10-CM | POA: Diagnosis not present

## 2023-02-28 DIAGNOSIS — R2689 Other abnormalities of gait and mobility: Secondary | ICD-10-CM | POA: Diagnosis not present

## 2023-02-28 DIAGNOSIS — M25552 Pain in left hip: Secondary | ICD-10-CM | POA: Diagnosis not present

## 2023-02-28 NOTE — Therapy (Signed)
OUTPATIENT PHYSICAL THERAPY LOWER EXTREMITY TREATMENT   Patient Name: Michelle [name] MRN: [id number] DOB:[birth year], 77 y.o., female Today's Date: 02/28/2023  END OF SESSION:  PT End of Session - 02/28/23 1526     Visit Number 5    Number of Visits 16    Date for PT Re-Evaluation 03/26/23    Authorization Type [employer] MCR    PT Start Time 1521    PT Stop Time 1600    PT Time Calculation (min) 39 min    Activity Tolerance Patient tolerated treatment well    Behavior During Therapy WFL for tasks assessed/performed              Past Medical History:  Diagnosis Date   Acute hypoxemic respiratory failure (HCC) 04/24/2017   Anxiety    Arthritis    "back" (04/24/2017)   Atherosclerosis of coronary artery    Blurry vision, left eye    "YAG on the left; to be repaired" (04/24/2017)   Chondromalacia of knee    right knee   Chronic bronchitis (HCC)    Chronic lower back pain    DDD (degenerative disc disease) 07/24/2011   Osteoarthritis-s/p LTKA 0'86, now right planned   Depression 07/24/2011   tx. depression only   DOE (dyspnea on exertion)    Fatty liver    History of diverticulitis 02/2018   Hx of adenomatous polyp of colon 03/21/2014   Hyperlipidemia    Hypertension    IBS (irritable bowel syndrome)    OSA on CPAP 12/07/2016   Pneumonia 1990s X 1   SOB (shortness of breath)    Vitamin D deficiency    Past Surgical History:  Procedure Laterality Date   APPENDECTOMY  1967   BOTOX INJECTION     "face"   CATARACT EXTRACTION W/ INTRAOCULAR LENS IMPLANT Bilateral 2000s   CESAREAN SECTION  1972; 1975   COLONOSCOPY W/ BIOPSIES AND POLYPECTOMY  "a few"   DILATION AND CURETTAGE OF UTERUS     HYSTEROSCOPY WITH D & C  08/29/2006   and resection of endometrial polyp/notes 09/10/2010   IR IMAGING GUIDED PORT INSERTION  01/18/2021   JOINT REPLACEMENT     KNEE ARTHROSCOPY Bilateral    NASAL SEPTUM SURGERY  1972   OOPHORECTOMY Right 1994   TOTAL KNEE ARTHROPLASTY  08/05/2011    Procedure: TOTAL KNEE ARTHROPLASTY;  Surgeon: Loanne Drilling, MD;  Location: [facility];  Service: Orthopedics;  Laterality: Right;   TOTAL KNEE ARTHROPLASTY Left 05/2009   TUBAL LIGATION  1975   Patient Active Problem List   Diagnosis Date Noted   Iron overload 06/20/2022   Dyspnea 03/25/2022   Port-A-Cath in place 08/27/2021   Macrocytosis without anemia 08/30/2020   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   Cough 05/14/2017   ETOH abuse 04/27/2017   Abnormal LFTs 04/26/2017   Thrombocytopenia (HCC) 04/26/2017   Dyspnea on exertion 03/18/2017   Morbid obesity (HCC) 03/18/2017   Obstructive sleep apnea 12/07/2016   Essential hypertension 12/07/2016   Hx of adenomatous polyp of colon 03/21/2014   Postop Hyponatremia 08/07/2011   OA (osteoarthritis) of knee 08/05/2011   Pain in limb 05/15/2011    PCP: [name] MD   REFERRING PROVIDER: Eartha Inch PA  REFERRING DIAG:  Diagnosis  M25.552 (ICD-10-CM) - Pain in left hip    THERAPY DIAG:  Pain in left hip  Other abnormalities of gait and mobility  Stiffness of left hip, not elsewhere classified  Rationale for Evaluation and Treatment: Rehabilitation  ONSET DATE: about 3 to 4 months she began having increased pain.   SUBJECTIVE:   SUBJECTIVE STATEMENT: Pt reports that she has MRI of back on 03/05/23, and hip injection on 03/12/23.      From initial Eval The patient had an acute onset of left  hip pain about 3-4 months ago. She has increased pain with standing and walking. She had an injection a few weeks ago which helped for a short period of time.    PERTINENT HISTORY: DDD, chronic low back pain, shortness of breath, shoulder pain  PAIN:  Are you having pain? Yes: NPRS scale: 4/10 Pain location: left lateral hip  Pain description: aching  Aggravating factors: standing and walking  Relieving factors: rest/ ice   PRECAUTIONS: None  RED FLAGS: None   WEIGHT BEARING RESTRICTIONS: No  FALLS:   Has patient fallen in last 6 months? No  LIVING ENVIRONMENT: 2 steps into the front door other wise 1 level OCCUPATION:  Retired   Hobbies:  Nothing that the hip is preventing her from doing Likes to dance    PLOF: Independent  PATIENT GOALS:  To be able to walk without pain. To be able to keep up with friends on cruise.    NEXT MD VISIT:   OBJECTIVE:  Note: Objective measures were completed at Evaluation unless otherwise noted.  DIAGNOSTIC FINDINGS:  Left knee: (-)   PATIENT SURVEYS:  FOTO    COGNITION: Overall cognitive status: Within functional limits for tasks assessed     SENSATION: WFL     POSTURE: No Significant postural limitations  PALPATION: Tender to palpation in the lateral hip around the brusa  LOWER EXTREMITY ROM:  Passive ROM Right eval Left eval  Hip flexion Painful at end range   Hip extension    Hip abduction    Hip adduction    Hip internal rotation No pain   Hip external rotation No pain    Knee flexion    Knee extension    Ankle dorsiflexion    Ankle plantarflexion    Ankle inversion    Ankle eversion     (Blank rows = not tested)  LOWER EXTREMITY MMT:  MMT Right eval Left eval  Hip flexion 16.3 10.3  Hip extension    Hip abduction 19.8 16.9  Hip adduction    Hip internal rotation    Hip external rotation    Knee flexion    Knee extension 31 26.9  Ankle dorsiflexion    Ankle plantarflexion    Ankle inversion    Ankle eversion     (Blank rows = not tested)   GAIT: Significant lateral movement away from the left hip. Trendelenburg gait. Significant improvement in gait with a cane on the right side  TODAY'S TREATMENT:  DATE:   02/28/23 Aquatic therapy: -Warm up of walking unsupported forward/ backward/ side stepping - multiple laps  - holding wall:  alternating single leg clams, hip  openers, hip crosses, straight LE circles, leg swings into hip abdct/ addct crossing midline - return to walking forward/backward  - straddling noodle and holding corner, cycling and suspended hip abdct/addct - at wall, L hip flexor stretch  - after dried off, applied reg Rock tape to Lt hip from just superior to greater trochanter to upper 2/3 of ITB,  placed perpendicular strips across superior portion of first strip at area of most intense pain - to decompress tissue, and increase proprioception.    11/5 Hip abdcution 3x10 red  LTR 2x15   Ball roll out x10 5 sec hold  Lateral 5x 10 sec hold each  Ball press 2x10   Bilateral ER yellow    10/31 Trigger Point Dry-Needling  Treatment instructions: Expect mild to moderate muscle soreness. S/S of pneumothorax if dry needled over a lung field, and to seek immediate medical attention should they occur. Patient verbalized understanding of these instructions and education.  Patient Consent Given: Yes Education handout provided: Yes Muscles treated: right glute med x3 right glut min 2x  Electrical stimulation performed: No Parameters: N/A Treatment response/outcome: great twitch   Manual" skilled palpation of trigger point   Hip abdcution 3x15 red  LTR 2x15  Gluteal stretch 3x20 sec hold  LAQ 2x15 red   10/15 Trigger Point Dry-Needling  Treatment instructions: Expect mild to moderate muscle soreness. S/S of pneumothorax if dry needled over a lung field, and to seek immediate medical attention should they occur. Patient verbalized understanding of these instructions and education.  Patient Consent Given: Yes Education handout provided: Yes Muscles treated: right glute med x3 right glut min 2x  Electrical stimulation performed: No Parameters: N/A Treatment response/outcome: great twitch   Eval:Access Code: [email or web address] URL: [email or web address] Date: 01/30/2023 Prepared by: Lorayne Bender  Exercises - Supine  Piriformis Stretch with Foot on Ground  - 1 x daily - 7 x weekly - 3 sets - 3 reps - 20sec  hold - Supine Lower Trunk Rotation  - 1 x daily - 7 x weekly - 3 sets - 10 reps - Theracane Over Shoulder  - 1 x daily - 7 x weekly - 3 sets - 10 reps - Seated Hip Abduction with Resistance  - 1 x daily - 7 x weekly - 3 sets - 10 reps  Manual: trigger point release to left gluteal in right side lying; reviewed of self soft tissue mobilization   PATIENT EDUCATION:  Education details: TENS information; ktape rationale and safe removal technique,  Person educated: Patient Education method: Explanation, Demonstration, Tactile cues, Verbal cues, and Handouts Education comprehension: verbalized understanding, returned demonstration, verbal cues required, tactile cues required, and needs further education  HOME EXERCISE PROGRAM: Access Code: [email or web address] URL: [email or web address] Date: 01/30/2023 Prepared by: Lorayne Bender  Exercises - Supine Piriformis Stretch with Foot on Ground  - 1 x daily - 7 x weekly - 3 sets - 3 reps - 20sec  hold - Supine Lower Trunk Rotation  - 1 x daily - 7 x weekly - 3 sets - 10 reps - Theracane Over Shoulder  - 1 x daily - 7 x weekly - 3 sets - 10 reps - Seated Hip Abduction with Resistance  - 1 x daily - 7 x weekly - 3 sets - 10 reps  ASSESSMENT:  CLINICAL IMPRESSION: Pt with limited tolerance  for exercises in water due to elevated pain level with certain hip motions. Pt required cues to dial back the height of leg into flex or abdct as this increased her pain. Educated pt regarding it is not necessarily "no pain/no gain" while in water; looking for reduction of pain while using buoyancy of water.  Encouraged pt to trial ice to irritated area and issued info on TENS unit.  Trial of kinesiology tape to tender area of Lt hip.    Goals are ongoing; Therapist to check STG next visit, including FOTO. Will plan to update aquatic HEP at next pool visit.      Eval:Patient is a 77 year old female with progressive onset of left hip pain.  She has significant spasming in her glutes and down her IT band.  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  She has an antalgic gait.  She had improved pain with use of a cane.  She has a cane at home and plans to start using it for short-term.  She has weakness on the left compared to right.  She would benefit from skilled therapy to improve ability to ambulate and perform daily tasks without increased pain. OBJECTIVE IMPAIRMENTS: Abnormal gait, decreased activity tolerance, difficulty walking, decreased ROM, decreased strength, and pain.   ACTIVITY LIMITATIONS: carrying, bending, standing, squatting, stairs, transfers, and locomotion level  PARTICIPATION LIMITATIONS: meal prep, cleaning, driving, shopping, community activity, and yard work  PERSONAL FACTORS: 1-2 comorbidities: baseline left knee pain; baseline shoulder issues   are also affecting patient's functional outcome.   REHAB POTENTIAL: Good  CLINICAL DECISION MAKING: Evolving/moderate complexity  EVALUATION COMPLEXITY: Moderate   GOALS: Goals reviewed with patient? Yes  SHORT TERM GOALS: Target date: 02/27/2023   Patient will increase gross left lower extremity strength by 5 pounds Baseline: Goal status: INITIAL  2.  Patient will ambulate 300 feet with least restrictive assistive device without increased pain Baseline:  Goal status: INITIAL  3.  Patient will report a 50% reduction in tenderness to palpation of the left gluteal Baseline:  Goal status: INITIAL  4.  Patient will be patient will be independent with basic HEP Baseline:  Goal status: INITIAL  LONG TERM GOALS: Target date: 03/27/2023    Patient will walk community distances without increased pain Baseline:  Goal status: INITIAL  2.  Patient will go up and down 6 steps with reciprocal gait without increased pain in order to  improve community access Baseline:  Goal status: INITIAL  3.  Patient will have complete exercise program to continue progressive strengthening of left hip Baseline:  Goal status: INITIAL  PLAN:  PT FREQUENCY: 2x/week  PT DURATION: 8 weeks  PLANNED INTERVENTIONS: Therapeutic exercises, Therapeutic activity, Neuromuscular re-education, Balance training, Gait training, Patient/Family education, Self Care, Joint mobilization, Stair training, DME instructions, Aquatic Therapy, Dry Needling, Electrical stimulation, Cryotherapy, Moist heat, Taping, Manual therapy, and Re-evaluation.   PLAN FOR NEXT SESSION: Consider trigger point dry needling to left glute. Consider continued soft tissue mobilization to the left gluteal advance exercise program.  Mayer Camel, PTA 02/28/23 5:06 PM [facility] [address] [place], [place], [address] Phone: [phone]   Fax:  [phone]

## 2023-03-01 DIAGNOSIS — R092 Respiratory arrest: Secondary | ICD-10-CM | POA: Diagnosis not present

## 2023-03-01 DIAGNOSIS — J441 Chronic obstructive pulmonary disease with (acute) exacerbation: Secondary | ICD-10-CM | POA: Diagnosis not present

## 2023-03-01 DIAGNOSIS — G4733 Obstructive sleep apnea (adult) (pediatric): Secondary | ICD-10-CM | POA: Diagnosis not present

## 2023-03-01 DIAGNOSIS — R0902 Hypoxemia: Secondary | ICD-10-CM | POA: Diagnosis not present

## 2023-03-03 ENCOUNTER — Ambulatory Visit (HOSPITAL_BASED_OUTPATIENT_CLINIC_OR_DEPARTMENT_OTHER): Payer: Medicare Other

## 2023-03-03 ENCOUNTER — Encounter (HOSPITAL_BASED_OUTPATIENT_CLINIC_OR_DEPARTMENT_OTHER): Payer: Self-pay

## 2023-03-03 DIAGNOSIS — M25652 Stiffness of left hip, not elsewhere classified: Secondary | ICD-10-CM | POA: Diagnosis not present

## 2023-03-03 DIAGNOSIS — M25552 Pain in left hip: Secondary | ICD-10-CM

## 2023-03-03 DIAGNOSIS — R2689 Other abnormalities of gait and mobility: Secondary | ICD-10-CM | POA: Diagnosis not present

## 2023-03-03 NOTE — Therapy (Signed)
OUTPATIENT PHYSICAL THERAPY LOWER EXTREMITY TREATMENT   Patient Name: Michelle [name] MRN: [id number] DOB:01/07/1946, 77 y.o., female Today's Date: 03/03/2023  END OF SESSION:  PT End of Session - 03/03/23 1320     Visit Number 6    Number of Visits 16    Date for PT Re-Evaluation 03/26/23    Authorization Type BCBS MCR    PT Start Time 1303    PT Stop Time 1354    PT Time Calculation (min) 51 min    Activity Tolerance Patient tolerated treatment well    Behavior During Therapy WFL for tasks assessed/performed               Past Medical History:  Diagnosis Date   Acute hypoxemic respiratory failure (HCC) 04/24/2017   Anxiety    Arthritis    "back" (04/24/2017)   Atherosclerosis of coronary artery    Blurry vision, left eye    "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 depression only   DOE (dyspnea on exertion)    Fatty liver    History of diverticulitis 02/2018   Hx of adenomatous polyp of colon 03/21/2014   Hyperlipidemia    Hypertension    IBS (irritable bowel syndrome)    OSA on CPAP 12/07/2016   Pneumonia 1990s X 1   SOB (shortness of breath)    Vitamin D deficiency    Past Surgical History:  Procedure Laterality Date   APPENDECTOMY  1967   BOTOX INJECTION     "face"   CATARACT EXTRACTION W/ INTRAOCULAR LENS IMPLANT Bilateral 2000s   CESAREAN SECTION  1972; 1975   COLONOSCOPY W/ BIOPSIES AND POLYPECTOMY  "a few"   DILATION AND CURETTAGE OF UTERUS     HYSTEROSCOPY WITH D & C  08/29/2006   and resection of endometrial polyp/notes 09/10/2010   IR IMAGING GUIDED PORT INSERTION  01/18/2021   JOINT REPLACEMENT     KNEE ARTHROSCOPY Bilateral    NASAL SEPTUM SURGERY  1972   OOPHORECTOMY Right 1994   TOTAL KNEE ARTHROPLASTY  08/05/2011    Procedure: TOTAL KNEE ARTHROPLASTY;  Surgeon: Loanne Drilling, MD;  Location: [facility];  Service: Orthopedics;  Laterality: Right;   TOTAL KNEE ARTHROPLASTY Left 05/2009   TUBAL LIGATION  1975   Patient Active Problem List   Diagnosis Date Noted   Iron overload 06/20/2022   Dyspnea 03/25/2022   Port-A-Cath in place 08/27/2021   Macrocytosis without anemia 08/30/2020   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   Cough 05/14/2017   ETOH abuse 04/27/2017   Abnormal LFTs 04/26/2017   Thrombocytopenia (HCC) 04/26/2017   Dyspnea on exertion 03/18/2017   Morbid obesity (HCC) 03/18/2017   Obstructive sleep apnea 12/07/2016   Essential hypertension 12/07/2016   Hx of adenomatous polyp of colon 03/21/2014   Postop Hyponatremia 08/07/2011   OA (osteoarthritis) of knee 08/05/2011   Pain in limb 05/15/2011    PCP: [name] MD   REFERRING PROVIDER: Eartha Inch PA  REFERRING DIAG:  Diagnosis  M25.552 (ICD-10-CM) - Pain in left hip    THERAPY DIAG:  Stiffness of left hip, not elsewhere classified  Other abnormalities of gait and mobility  Pain in left hip  Rationale for Evaluation and Treatment:  Rehabilitation  ONSET DATE: about 3 to 4 months she began having increased pain.   SUBJECTIVE:   SUBJECTIVE STATEMENT: Pt reports that she has MRI of back on 03/05/23, and hip injection on 03/12/23. She arrives with TENS unit requesting assistance with step up.      From initial Eval The patient had an acute onset of left  hip pain about 3-4 months ago. She has increased pain with standing and walking. She had an injection a few weeks ago which helped for a short period of time.    PERTINENT HISTORY: DDD, chronic low back pain, shortness of breath, shoulder pain  PAIN:  Are you having pain? Yes: NPRS scale: 4/10 Pain location: left lateral hip  Pain description: aching  Aggravating factors: standing and walking  Relieving factors: rest/ ice   PRECAUTIONS:  None  RED FLAGS: None   WEIGHT BEARING RESTRICTIONS: No  FALLS:  Has patient fallen in last 6 months? No  LIVING ENVIRONMENT: 2 steps into the front door other wise 1 level OCCUPATION:  Retired   Hobbies:  Nothing that the hip is preventing her from doing Likes to dance    PLOF: Independent  PATIENT GOALS:  To be able to walk without pain. To be able to keep up with friends on cruise.    NEXT MD VISIT:   OBJECTIVE:  Note: Objective measures were completed at Evaluation unless otherwise noted.  DIAGNOSTIC FINDINGS:  Left knee: (-)   PATIENT SURVEYS:  FOTO   FOTO: 33% on 11/11 COGNITION: Overall cognitive status: Within functional limits for tasks assessed     SENSATION: WFL     POSTURE: No Significant postural limitations  PALPATION: Tender to palpation in the lateral hip around the brusa  LOWER EXTREMITY ROM:  Passive ROM Right eval Left eval  Hip flexion Painful at end range   Hip extension    Hip abduction    Hip adduction    Hip internal rotation No pain   Hip external rotation No pain    Knee flexion    Knee extension    Ankle dorsiflexion    Ankle plantarflexion    Ankle inversion    Ankle eversion     (Blank rows = not tested)  LOWER EXTREMITY MMT:  MMT Right eval Left eval Right 11/11 Left 11/11  Hip flexion 16.3 10.3 23.4 26.1  Hip extension      Hip abduction 19.8 16.9 32.3 27.6 (seated)  Hip adduction      Hip internal rotation      Hip external rotation      Knee flexion      Knee extension 31 26.9 37.6 31.6  Ankle dorsiflexion      Ankle plantarflexion      Ankle inversion      Ankle eversion       (Blank rows = not tested)   GAIT: Significant lateral movement away from the left hip. Trendelenburg gait. Significant improvement in gait with a cane on the right side  TODAY'S TREATMENT:  DATE:     11/11 Instruction and set up of TENS unit LTR x10ea Piriformis stretch (gentle) 20sec x2 STM to lateral hip/IASTM with roller STM to QL and L psoas Thomas stretch 3x30sec Gait in hall   FOTO: 33% STG review MMT   02/28/23 Aquatic therapy: -Warm up of walking unsupported forward/ backward/ side stepping - multiple laps  - holding wall:  alternating single leg clams, hip openers, hip crosses, straight LE circles, leg swings into hip abdct/ addct crossing midline - return to walking forward/backward  - straddling noodle and holding corner, cycling and suspended hip abdct/addct - at wall, L hip flexor stretch  - after dried off, applied reg Rock tape to Lt hip from just superior to greater trochanter to upper 2/3 of ITB,  placed perpendicular strips across superior portion of first strip at area of most intense pain - to decompress tissue, and increase proprioception.    11/5 Hip abdcution 3x10 red  LTR 2x15   Ball roll out x10 5 sec hold  Lateral 5x 10 sec hold each  Ball press 2x10   Bilateral ER yellow    10/31 Trigger Point Dry-Needling  Treatment instructions: Expect mild to moderate muscle soreness. S/S of pneumothorax if dry needled over a lung field, and to seek immediate medical attention should they occur. Patient verbalized understanding of these instructions and education.  Patient Consent Given: Yes Education handout provided: Yes Muscles treated: right glute med x3 right glut min 2x  Electrical stimulation performed: No Parameters: N/A Treatment response/outcome: great twitch   Manual" skilled palpation of trigger point   Hip abdcution 3x15 red  LTR 2x15  Gluteal stretch 3x20 sec hold  LAQ 2x15 red   10/15 Trigger Point Dry-Needling  Treatment instructions: Expect mild to moderate muscle soreness. S/S of pneumothorax if dry needled over a lung field, and to seek immediate medical attention should they occur. Patient verbalized  understanding of these instructions and education.  Patient Consent Given: Yes Education handout provided: Yes Muscles treated: right glute med x3 right glut min 2x  Electrical stimulation performed: No Parameters: N/A Treatment response/outcome: great twitch   Eval:Access Code: [email or web address] URL: [email or web address] Date: 01/30/2023 Prepared by: Lorayne Bender  Exercises - Supine Piriformis Stretch with Foot on Ground  - 1 x daily - 7 x weekly - 3 sets - 3 reps - 20sec  hold - Supine Lower Trunk Rotation  - 1 x daily - 7 x weekly - 3 sets - 10 reps - Theracane Over Shoulder  - 1 x daily - 7 x weekly - 3 sets - 10 reps - Seated Hip Abduction with Resistance  - 1 x daily - 7 x weekly - 3 sets - 10 reps  Manual: trigger point release to left gluteal in right side lying; reviewed of self soft tissue mobilization   PATIENT EDUCATION:  Education details: TENS information; ktape rationale and safe removal technique,  Person educated: Patient Education method: Explanation, Demonstration, Tactile cues, Verbal cues, and Handouts Education comprehension: verbalized understanding, returned demonstration, verbal cues required, tactile cues required, and needs further education  HOME EXERCISE PROGRAM: Access Code: [email or web address] URL: [email or web address] Date: 01/30/2023 Prepared by: Lorayne Bender  Exercises - Supine Piriformis Stretch with Foot on Ground  - 1 x daily - 7 x weekly - 3 sets - 3 reps - 20sec  hold - Supine Lower Trunk Rotation  - 1 x daily - 7 x weekly - 3 sets - 10 reps - Theracane Over Shoulder  -  1 x daily - 7 x weekly - 3 sets - 10 reps - Seated Hip Abduction with Resistance  - 1 x daily - 7 x weekly - 3 sets - 10 reps  ASSESSMENT:  CLINICAL IMPRESSION: Reviewed TENS unit use with pt and set up for high rate. Pt reported benefit from use of TENS and demonstrated good understanding. She does have mild skin irritation from rock tape (took it off this  morning). Will assess area next visit and reapply tape if skin is not irritated. Performed manual techniques including STM and IASTM to lateral hip, QL, and L psoas. She reported feeling benefit from psoas release. Followed this by thomas stretch with good tolerance. FOTO score decreased to 33%. Pt has met 2/4 STG.    Eval:Patient is a 77 year old female with progressive onset of left hip pain.  She has significant spasming in her glutes and down her IT band.  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  She has an antalgic gait.  She had improved pain with use of a cane.  She has a cane at home and plans to start using it for short-term.  She has weakness on the left compared to right.  She would benefit from skilled therapy to improve ability to ambulate and perform daily tasks without increased pain. OBJECTIVE IMPAIRMENTS: Abnormal gait, decreased activity tolerance, difficulty walking, decreased ROM, decreased strength, and pain.   ACTIVITY LIMITATIONS: carrying, bending, standing, squatting, stairs, transfers, and locomotion level  PARTICIPATION LIMITATIONS: meal prep, cleaning, driving, shopping, community activity, and yard work  PERSONAL FACTORS: 1-2 comorbidities: baseline left knee pain; baseline shoulder issues   are also affecting patient's functional outcome.   REHAB POTENTIAL: Good  CLINICAL DECISION MAKING: Evolving/moderate complexity  EVALUATION COMPLEXITY: Moderate   GOALS: Goals reviewed with patient? Yes  SHORT TERM GOALS: Target date: 02/27/2023   Patient will increase gross left lower extremity strength by 5 pounds Baseline: Goal status: MET 11/11  2.  Patient will ambulate 300 feet with least restrictive assistive device without increased pain Baseline:  Goal status: NOT MET (still painful)  3.  Patient will report a 50% reduction in tenderness to palpation of the left gluteal Baseline:  Goal status: NOT MET ("about the  same")  4.  Patient will be patient will be independent with basic HEP Baseline:  Goal status: MET  LONG TERM GOALS: Target date: 03/27/2023    Patient will walk community distances without increased pain Baseline:  Goal status: INITIAL  2.  Patient will go up and down 6 steps with reciprocal gait without increased pain in order to improve community access Baseline:  Goal status: INITIAL  3.  Patient will have complete exercise program to continue progressive strengthening of left hip Baseline:  Goal status: INITIAL  PLAN:  PT FREQUENCY: 2x/week  PT DURATION: 8 weeks  PLANNED INTERVENTIONS: Therapeutic exercises, Therapeutic activity, Neuromuscular re-education, Balance training, Gait training, Patient/Family education, Self Care, Joint mobilization, Stair training, DME instructions, Aquatic Therapy, Dry Needling, Electrical stimulation, Cryotherapy, Moist heat, Taping, Manual therapy, and Re-evaluation.   PLAN FOR NEXT SESSION: Consider trigger point dry needling to left glute. Consider continued soft tissue mobilization to the left gluteal advance exercise program.  Riki Altes, PTA  03/03/23 3:09 PM [facility] [address] [place], [place], [address] Phone: [phone]   Fax:  [phone]

## 2023-03-04 DIAGNOSIS — M533 Sacrococcygeal disorders, not elsewhere classified: Secondary | ICD-10-CM | POA: Diagnosis not present

## 2023-03-06 ENCOUNTER — Encounter (HOSPITAL_BASED_OUTPATIENT_CLINIC_OR_DEPARTMENT_OTHER): Payer: Self-pay

## 2023-03-06 ENCOUNTER — Ambulatory Visit (HOSPITAL_BASED_OUTPATIENT_CLINIC_OR_DEPARTMENT_OTHER): Payer: Medicare Other

## 2023-03-06 DIAGNOSIS — M25552 Pain in left hip: Secondary | ICD-10-CM

## 2023-03-06 DIAGNOSIS — R2689 Other abnormalities of gait and mobility: Secondary | ICD-10-CM

## 2023-03-06 DIAGNOSIS — M25652 Stiffness of left hip, not elsewhere classified: Secondary | ICD-10-CM | POA: Diagnosis not present

## 2023-03-06 NOTE — Therapy (Signed)
OUTPATIENT PHYSICAL THERAPY LOWER EXTREMITY TREATMENT   Patient Name: Michelle [name] MRN: [id number] DOB:1945/07/31, 77 y.o., female Today's Date: 03/06/2023  END OF SESSION:  PT End of Session - 03/06/23 1548     Visit Number 7    Number of Visits 16    Date for PT Re-Evaluation 03/26/23    Authorization Type BCBS MCR    PT Start Time 1435    PT Stop Time 1518    PT Time Calculation (min) 43 min    Activity Tolerance Patient tolerated treatment well    Behavior During Therapy WFL for tasks assessed/performed                Past Medical History:  Diagnosis Date   Acute hypoxemic respiratory failure (HCC) 04/24/2017   Anxiety    Arthritis    "back" (04/24/2017)   Atherosclerosis of coronary artery    Blurry vision, left eye    "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 depression only   DOE (dyspnea on exertion)    Fatty liver    History of diverticulitis 02/2018   Hx of adenomatous polyp of colon 03/21/2014   Hyperlipidemia    Hypertension    IBS (irritable bowel syndrome)    OSA on CPAP 12/07/2016   Pneumonia 1990s X 1   SOB (shortness of breath)    Vitamin D deficiency    Past Surgical History:  Procedure Laterality Date   APPENDECTOMY  1967   BOTOX INJECTION     "face"   CATARACT EXTRACTION W/ INTRAOCULAR LENS IMPLANT Bilateral 2000s   CESAREAN SECTION  1972; 1975   COLONOSCOPY W/ BIOPSIES AND POLYPECTOMY  "a few"   DILATION AND CURETTAGE OF UTERUS     HYSTEROSCOPY WITH D & C  08/29/2006   and resection of endometrial polyp/notes 09/10/2010   IR IMAGING GUIDED PORT INSERTION  01/18/2021   JOINT REPLACEMENT     KNEE ARTHROSCOPY Bilateral    NASAL SEPTUM SURGERY  1972   OOPHORECTOMY Right 1994   TOTAL KNEE ARTHROPLASTY   08/05/2011   Procedure: TOTAL KNEE ARTHROPLASTY;  Surgeon: Loanne Drilling, MD;  Location: [facility];  Service: Orthopedics;  Laterality: Right;   TOTAL KNEE ARTHROPLASTY Left 05/2009   TUBAL LIGATION  1975   Patient Active Problem List   Diagnosis Date Noted   Iron overload 06/20/2022   Dyspnea 03/25/2022   Port-A-Cath in place 08/27/2021   Macrocytosis without anemia 08/30/2020   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   Cough 05/14/2017   ETOH abuse 04/27/2017   Abnormal LFTs 04/26/2017   Thrombocytopenia (HCC) 04/26/2017   Dyspnea on exertion 03/18/2017   Morbid obesity (HCC) 03/18/2017   Obstructive sleep apnea 12/07/2016   Essential hypertension 12/07/2016   Hx of adenomatous polyp of colon 03/21/2014   Postop Hyponatremia 08/07/2011   OA (osteoarthritis) of knee 08/05/2011   Pain in limb 05/15/2011    PCP: [name] MD   REFERRING PROVIDER: Eartha Inch PA  REFERRING DIAG:  Diagnosis  M25.552 (ICD-10-CM) - Pain in left hip    THERAPY DIAG:  Pain in left hip  Other abnormalities of gait and mobility  Stiffness of left hip, not elsewhere classified  Rationale for Evaluation and  Treatment: Rehabilitation  ONSET DATE: about 3 to 4 months she began having increased pain.   SUBJECTIVE:   SUBJECTIVE STATEMENT: Pt reported improved pain level after last visit. Had guided injection into low back back on Tuesday. Felt relief from this the same day. Feels like her walking is better. 2/10 pain level at entry.   From initial Eval The patient had an acute onset of left  hip pain about 3-4 months ago. She has increased pain with standing and walking. She had an injection a few weeks ago which helped for a short period of time.    PERTINENT HISTORY: DDD, chronic low back pain, shortness of breath, shoulder pain  PAIN:  Are you having pain? Yes: NPRS scale: 2/10 Pain location: left lateral hip  Pain description: aching  Aggravating factors: standing and  walking  Relieving factors: rest/ ice   PRECAUTIONS: None  RED FLAGS: None   WEIGHT BEARING RESTRICTIONS: No  FALLS:  Has patient fallen in last 6 months? No  LIVING ENVIRONMENT: 2 steps into the front door other wise 1 level OCCUPATION:  Retired   Hobbies:  Nothing that the hip is preventing her from doing Likes to dance    PLOF: Independent  PATIENT GOALS:  To be able to walk without pain. To be able to keep up with friends on cruise.    NEXT MD VISIT:   OBJECTIVE:  Note: Objective measures were completed at Evaluation unless otherwise noted.  DIAGNOSTIC FINDINGS:  Left knee: (-)   PATIENT SURVEYS:  FOTO   FOTO: 33% on 11/11 COGNITION: Overall cognitive status: Within functional limits for tasks assessed     SENSATION: WFL     POSTURE: No Significant postural limitations  PALPATION: Tender to palpation in the lateral hip around the brusa  LOWER EXTREMITY ROM:  Passive ROM Right eval Left eval  Hip flexion Painful at end range   Hip extension    Hip abduction    Hip adduction    Hip internal rotation No pain   Hip external rotation No pain    Knee flexion    Knee extension    Ankle dorsiflexion    Ankle plantarflexion    Ankle inversion    Ankle eversion     (Blank rows = not tested)  LOWER EXTREMITY MMT:  MMT Right eval Left eval Right 11/11 Left 11/11  Hip flexion 16.3 10.3 23.4 26.1  Hip extension      Hip abduction 19.8 16.9 32.3 27.6 (seated)  Hip adduction      Hip internal rotation      Hip external rotation      Knee flexion      Knee extension 31 26.9 37.6 31.6  Ankle dorsiflexion      Ankle plantarflexion      Ankle inversion      Ankle eversion       (Blank rows = not tested)   GAIT: Significant lateral movement away from the left hip. Trendelenburg gait. Significant improvement in gait with a cane on the right side  TODAY'S TREATMENT:  DATE:    11/14 Instruction and set up of TENS unit LTR x10ea Piriformis stretch (gentle) 30sec x2ea/bil STM to posterior hip STM to  L psoas Thomas stretch 3x30sec Clam isometric in hooklying with gait belt 3" 2x10 Gait in hall Sidestepping at rail x 3 laps Retro walking x2laps at rail  FOTO: 33% STG review MMT   11/11 Instruction and set up of TENS unit LTR x10ea Piriformis stretch (gentle) 20sec x2 STM to lateral hip/IASTM with roller STM to QL and L psoas Thomas stretch 3x30sec Gait in hall   FOTO: 33% STG review MMT   02/28/23 Aquatic therapy: -Warm up of walking unsupported forward/ backward/ side stepping - multiple laps  - holding wall:  alternating single leg clams, hip openers, hip crosses, straight LE circles, leg swings into hip abdct/ addct crossing midline - return to walking forward/backward  - straddling noodle and holding corner, cycling and suspended hip abdct/addct - at wall, L hip flexor stretch  - after dried off, applied reg Rock tape to Lt hip from just superior to greater trochanter to upper 2/3 of ITB,  placed perpendicular strips across superior portion of first strip at area of most intense pain - to decompress tissue, and increase proprioception.    11/5 Hip abdcution 3x10 red  LTR 2x15   Ball roll out x10 5 sec hold  Lateral 5x 10 sec hold each  Ball press 2x10   Bilateral ER yellow    PATIENT EDUCATION:  Education details: TENS information; ktape rationale and safe removal technique,  Person educated: Patient Education method: Explanation, Demonstration, Tactile cues, Verbal cues, and Handouts Education comprehension: verbalized understanding, returned demonstration, verbal cues required, tactile cues required, and needs further education  HOME EXERCISE PROGRAM: Access Code: [email or web address] URL: [email or web address] Date: 01/30/2023 Prepared by: Lorayne Bender  Exercises - Supine Piriformis Stretch with Foot on Ground  - 1 x daily - 7 x weekly - 3 sets - 3 reps - 20sec  hold - Supine Lower Trunk Rotation  - 1 x daily - 7 x weekly - 3 sets - 10 reps - Theracane Over Shoulder  - 1 x daily - 7 x weekly - 3 sets - 10 reps - Seated Hip Abduction with Resistance  - 1 x daily - 7 x weekly - 3 sets - 10 reps  ASSESSMENT:  CLINICAL IMPRESSION: Patient with improved tolerance for exercise today due to her pain level.  Patient continues to retain improved gait quality.  Continues to benefit from hip stretching and gentle strengthening.  Performed isometric hip abduction for facilitation without complaint of pain.  She does remain tender in the hip trigger points and posterior glue as well as left psoas area.  Spent time on manual therapy to these areas with palpable release observed.  Will continue to monitor her symptoms and progress as tolerated with mobility and strengthening.   Eval:Patient is a 77 year old female with progressive onset of left hip pain.  She has significant spasming in her glutes and down her IT band.  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  She has an antalgic gait.  She had improved pain with use of a cane.  She has a cane at home and plans to start using it for short-term.  She has weakness on the left compared to right.  She would benefit from skilled therapy to improve ability to ambulate and perform daily tasks without increased pain. OBJECTIVE IMPAIRMENTS: Abnormal gait, decreased activity tolerance,  difficulty walking, decreased ROM, decreased strength, and pain.   ACTIVITY LIMITATIONS: carrying, bending, standing, squatting, stairs, transfers, and locomotion level  PARTICIPATION LIMITATIONS: meal prep, cleaning, driving, shopping, community activity, and yard work  PERSONAL FACTORS: 1-2 comorbidities: baseline left knee pain; baseline shoulder issues   are also affecting patient's  functional outcome.   REHAB POTENTIAL: Good  CLINICAL DECISION MAKING: Evolving/moderate complexity  EVALUATION COMPLEXITY: Moderate   GOALS: Goals reviewed with patient? Yes  SHORT TERM GOALS: Target date: 02/27/2023   Patient will increase gross left lower extremity strength by 5 pounds Baseline: Goal status: MET 11/11  2.  Patient will ambulate 300 feet with least restrictive assistive device without increased pain Baseline:  Goal status: NOT MET (still painful)  3.  Patient will report a 50% reduction in tenderness to palpation of the left gluteal Baseline:  Goal status: NOT MET ("about the same")  4.  Patient will be patient will be independent with basic HEP Baseline:  Goal status: MET  LONG TERM GOALS: Target date: 03/27/2023    Patient will walk community distances without increased pain Baseline:  Goal status: INITIAL  2.  Patient will go up and down 6 steps with reciprocal gait without increased pain in order to improve community access Baseline:  Goal status: INITIAL  3.  Patient will have complete exercise program to continue progressive strengthening of left hip Baseline:  Goal status: INITIAL  PLAN:  PT FREQUENCY: 2x/week  PT DURATION: 8 weeks  PLANNED INTERVENTIONS: Therapeutic exercises, Therapeutic activity, Neuromuscular re-education, Balance training, Gait training, Patient/Family education, Self Care, Joint mobilization, Stair training, DME instructions, Aquatic Therapy, Dry Needling, Electrical stimulation, Cryotherapy, Moist heat, Taping, Manual therapy, and Re-evaluation.   PLAN FOR NEXT SESSION: Consider trigger point dry needling to left glute. Consider continued soft tissue mobilization to the left gluteal advance exercise program.  Riki Altes, PTA  03/06/23 3:52 PM [facility] [address] [place], [place], [address] Phone: [phone]   Fax:  [phone]

## 2023-03-10 ENCOUNTER — Other Ambulatory Visit (HOSPITAL_COMMUNITY): Payer: Self-pay

## 2023-03-10 ENCOUNTER — Encounter (HOSPITAL_BASED_OUTPATIENT_CLINIC_OR_DEPARTMENT_OTHER): Payer: Medicare Other

## 2023-03-11 ENCOUNTER — Encounter (HOSPITAL_BASED_OUTPATIENT_CLINIC_OR_DEPARTMENT_OTHER): Payer: Self-pay

## 2023-03-11 ENCOUNTER — Ambulatory Visit (HOSPITAL_BASED_OUTPATIENT_CLINIC_OR_DEPARTMENT_OTHER): Payer: Medicare Other | Admitting: Physical Therapy

## 2023-03-13 ENCOUNTER — Encounter (HOSPITAL_BASED_OUTPATIENT_CLINIC_OR_DEPARTMENT_OTHER): Payer: Self-pay

## 2023-03-13 ENCOUNTER — Ambulatory Visit (HOSPITAL_BASED_OUTPATIENT_CLINIC_OR_DEPARTMENT_OTHER): Payer: Medicare Other

## 2023-03-13 DIAGNOSIS — R2689 Other abnormalities of gait and mobility: Secondary | ICD-10-CM

## 2023-03-13 DIAGNOSIS — M25652 Stiffness of left hip, not elsewhere classified: Secondary | ICD-10-CM | POA: Diagnosis not present

## 2023-03-13 DIAGNOSIS — M25552 Pain in left hip: Secondary | ICD-10-CM | POA: Diagnosis not present

## 2023-03-13 NOTE — Therapy (Signed)
OUTPATIENT PHYSICAL THERAPY LOWER EXTREMITY TREATMENT   Patient Name: Michelle [name] MRN: [id number] DOB:[birth year], 77 y.o., female Today's Date: 03/13/2023  END OF SESSION:  PT End of Session - 03/13/23 1455     Visit Number 8    Number of Visits 16    Date for PT Re-Evaluation 03/26/23    Authorization Type BCBS MCR    PT Start Time 1455   arrived late   PT Stop Time 1517    PT Time Calculation (min) 22 min    Activity Tolerance Patient tolerated treatment well    Behavior During Therapy WFL for tasks assessed/performed                 Past Medical History:  Diagnosis Date   Acute hypoxemic respiratory failure (HCC) 04/24/2017   Anxiety    Arthritis    "back" (04/24/2017)   Atherosclerosis of coronary artery    Blurry vision, left eye    "YAG on the left; to be repaired" (04/24/2017)   Chondromalacia of knee    right knee   Chronic bronchitis (HCC)    Chronic lower back pain    DDD (degenerative disc disease) 07/24/2011   Osteoarthritis-s/p LTKA 5'36, now right planned   Depression 07/24/2011   tx. depression only   DOE (dyspnea on exertion)    Fatty liver    History of diverticulitis 02/2018   Hx of adenomatous polyp of colon 03/21/2014   Hyperlipidemia    Hypertension    IBS (irritable bowel syndrome)    OSA on CPAP 12/07/2016   Pneumonia 1990s X 1   SOB (shortness of breath)    Vitamin D deficiency    Past Surgical History:  Procedure Laterality Date   APPENDECTOMY  1967   BOTOX INJECTION     "face"   CATARACT EXTRACTION W/ INTRAOCULAR LENS IMPLANT Bilateral 2000s   CESAREAN SECTION  1972; 1975   COLONOSCOPY W/ BIOPSIES AND POLYPECTOMY  "a few"   DILATION AND CURETTAGE OF UTERUS     HYSTEROSCOPY WITH D & C  08/29/2006   and resection of endometrial polyp/notes 09/10/2010   IR IMAGING GUIDED PORT INSERTION  01/18/2021   JOINT REPLACEMENT     KNEE ARTHROSCOPY Bilateral    NASAL SEPTUM SURGERY  1972   OOPHORECTOMY Right 1994   TOTAL KNEE  ARTHROPLASTY  08/05/2011   Procedure: TOTAL KNEE ARTHROPLASTY;  Surgeon: Loanne Drilling, MD;  Location: [facility];  Service: Orthopedics;  Laterality: Right;   TOTAL KNEE ARTHROPLASTY Left 05/2009   TUBAL LIGATION  1975   Patient Active Problem List   Diagnosis Date Noted   Iron overload 06/20/2022   Dyspnea 03/25/2022   Port-A-Cath in place 08/27/2021   Macrocytosis without anemia 08/30/2020   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   Cough 05/14/2017   ETOH abuse 04/27/2017   Abnormal LFTs 04/26/2017   Thrombocytopenia (HCC) 04/26/2017   Dyspnea on exertion 03/18/2017   Morbid obesity (HCC) 03/18/2017   Obstructive sleep apnea 12/07/2016   Essential hypertension 12/07/2016   Hx of adenomatous polyp of colon 03/21/2014   Postop Hyponatremia 08/07/2011   OA (osteoarthritis) of knee 08/05/2011   Pain in limb 05/15/2011    PCP: [name] MD   REFERRING PROVIDER: Eartha Inch PA  REFERRING DIAG:  Diagnosis  M25.552 (ICD-10-CM) - Pain in left hip    THERAPY DIAG:  Pain in left hip  Other abnormalities of gait and mobility  Stiffness of left hip, not elsewhere classified  Rationale for Evaluation and Treatment: Rehabilitation  ONSET DATE: about 3 to 4 months she began having increased pain.   SUBJECTIVE:   SUBJECTIVE STATEMENT: Pt reports improved walking and continued improved pain level.    From initial Eval The patient had an acute onset of left  hip pain about 3-4 months ago. She has increased pain with standing and walking. She had an injection a few weeks ago which helped for a short period of time.    PERTINENT HISTORY: DDD, chronic low back pain, shortness of breath, shoulder pain  PAIN:  Are you having pain? Yes: NPRS scale: 2/10 Pain location: left lateral hip  Pain description: aching  Aggravating factors: standing and walking  Relieving factors: rest/ ice   PRECAUTIONS: None  RED FLAGS: None   WEIGHT BEARING RESTRICTIONS:  No  FALLS:  Has patient fallen in last 6 months? No  LIVING ENVIRONMENT: 2 steps into the front door other wise 1 level OCCUPATION:  Retired   Hobbies:  Nothing that the hip is preventing her from doing Likes to dance    PLOF: Independent  PATIENT GOALS:  To be able to walk without pain. To be able to keep up with friends on cruise.    NEXT MD VISIT:   OBJECTIVE:  Note: Objective measures were completed at Evaluation unless otherwise noted.  DIAGNOSTIC FINDINGS:  Left knee: (-)   PATIENT SURVEYS:  FOTO   FOTO: 33% on 11/11 COGNITION: Overall cognitive status: Within functional limits for tasks assessed     SENSATION: WFL     POSTURE: No Significant postural limitations  PALPATION: Tender to palpation in the lateral hip around the brusa  LOWER EXTREMITY ROM:  Passive ROM Right eval Left eval  Hip flexion Painful at end range   Hip extension    Hip abduction    Hip adduction    Hip internal rotation No pain   Hip external rotation No pain    Knee flexion    Knee extension    Ankle dorsiflexion    Ankle plantarflexion    Ankle inversion    Ankle eversion     (Blank rows = not tested)  LOWER EXTREMITY MMT:  MMT Right eval Left eval Right 11/11 Left 11/11  Hip flexion 16.3 10.3 23.4 26.1  Hip extension      Hip abduction 19.8 16.9 32.3 27.6 (seated)  Hip adduction      Hip internal rotation      Hip external rotation      Knee flexion      Knee extension 31 26.9 37.6 31.6  Ankle dorsiflexion      Ankle plantarflexion      Ankle inversion      Ankle eversion       (Blank rows = not tested)   GAIT: Significant lateral movement away from the left hip. Trendelenburg gait. Significant improvement in gait with a cane on the right side  TODAY'S TREATMENT:  DATE:      11/21 LTR x10ea Piriformis stretch (gentle)  30sec x2ea/bil Thomas stretch x30sec Supine march 2x10ea Gait in hall Standing SLR flexion 2x10ea Standing hip extension 2x10ea Sidestepping at rail x 3 laps    11/14 Instruction and set up of TENS unit LTR x10ea Piriformis stretch (gentle) 30sec x2ea/bil STM to posterior hip STM to  L psoas Thomas stretch 3x30sec Clam isometric in hooklying with gait belt 3" 2x10 Gait in hall Sidestepping at rail x 3 laps Retro walking x2laps at rail  FOTO: 33% STG review MMT   11/11 Instruction and set up of TENS unit LTR x10ea Piriformis stretch (gentle) 20sec x2 STM to lateral hip/IASTM with roller STM to QL and L psoas Thomas stretch 3x30sec Gait in hall   FOTO: 33% STG review MMT   02/28/23 Aquatic therapy: -Warm up of walking unsupported forward/ backward/ side stepping - multiple laps  - holding wall:  alternating single leg clams, hip openers, hip crosses, straight LE circles, leg swings into hip abdct/ addct crossing midline - return to walking forward/backward  - straddling noodle and holding corner, cycling and suspended hip abdct/addct - at wall, L hip flexor stretch  - after dried off, applied reg Rock tape to Lt hip from just superior to greater trochanter to upper 2/3 of ITB,  placed perpendicular strips across superior portion of first strip at area of most intense pain - to decompress tissue, and increase proprioception.    11/5 Hip abdcution 3x10 red  LTR 2x15   Ball roll out x10 5 sec hold  Lateral 5x 10 sec hold each  Ball press 2x10   Bilateral ER yellow    PATIENT EDUCATION:  Education details: TENS information; ktape rationale and safe removal technique,  Person educated: Patient Education method: Explanation, Demonstration, Tactile cues, Verbal cues, and Handouts Education comprehension: verbalized understanding, returned demonstration, verbal cues required, tactile cues required, and needs further education  HOME EXERCISE PROGRAM: Access  Code: [email or web address] URL: [email or web address] Date: 01/30/2023 Prepared by: Lorayne Bender  Exercises - Supine Piriformis Stretch with Foot on Ground  - 1 x daily - 7 x weekly - 3 sets - 3 reps - 20sec  hold - Supine Lower Trunk Rotation  - 1 x daily - 7 x weekly - 3 sets - 10 reps - Theracane Over Shoulder  - 1 x daily - 7 x weekly - 3 sets - 10 reps - Seated Hip Abduction with Resistance  - 1 x daily - 7 x weekly - 3 sets - 10 reps  ASSESSMENT:  CLINICAL IMPRESSION: Patient arrived late and we had an abbreviated session.  Patient continues to benefit from hip stretching and strengthening.  Good tolerance for all interventions today.  Patient with improved gait quality compared to previous sessions.  Patient will be traveling to [place].  Will see her when she returns.  Eval:Patient is a 77 year old female with progressive onset of left hip pain.  She has significant spasming in her glutes and down her IT band.  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  She has an antalgic gait.  She had improved pain with use of a cane.  She has a cane at home and plans to start using it for short-term.  She has weakness on the left compared to right.  She would benefit from skilled therapy to improve ability to ambulate and perform daily tasks without increased pain. OBJECTIVE IMPAIRMENTS: Abnormal gait, decreased activity tolerance, difficulty walking,  decreased ROM, decreased strength, and pain.   ACTIVITY LIMITATIONS: carrying, bending, standing, squatting, stairs, transfers, and locomotion level  PARTICIPATION LIMITATIONS: meal prep, cleaning, driving, shopping, community activity, and yard work  PERSONAL FACTORS: 1-2 comorbidities: baseline left knee pain; baseline shoulder issues   are also affecting patient's functional outcome.   REHAB POTENTIAL: Good  CLINICAL DECISION MAKING: Evolving/moderate complexity  EVALUATION COMPLEXITY:  Moderate   GOALS: Goals reviewed with patient? Yes  SHORT TERM GOALS: Target date: 02/27/2023   Patient will increase gross left lower extremity strength by 5 pounds Baseline: Goal status: MET 11/11  2.  Patient will ambulate 300 feet with least restrictive assistive device without increased pain Baseline:  Goal status: NOT MET (still painful)  3.  Patient will report a 50% reduction in tenderness to palpation of the left gluteal Baseline:  Goal status: NOT MET ("about the same")  4.  Patient will be patient will be independent with basic HEP Baseline:  Goal status: MET  LONG TERM GOALS: Target date: 03/27/2023    Patient will walk community distances without increased pain Baseline:  Goal status: INITIAL  2.  Patient will go up and down 6 steps with reciprocal gait without increased pain in order to improve community access Baseline:  Goal status: INITIAL  3.  Patient will have complete exercise program to continue progressive strengthening of left hip Baseline:  Goal status: INITIAL  PLAN:  PT FREQUENCY: 2x/week  PT DURATION: 8 weeks  PLANNED INTERVENTIONS: Therapeutic exercises, Therapeutic activity, Neuromuscular re-education, Balance training, Gait training, Patient/Family education, Self Care, Joint mobilization, Stair training, DME instructions, Aquatic Therapy, Dry Needling, Electrical stimulation, Cryotherapy, Moist heat, Taping, Manual therapy, and Re-evaluation.   PLAN FOR NEXT SESSION: Consider trigger point dry needling to left glute. Consider continued soft tissue mobilization to the left gluteal advance exercise program.  Riki Altes, PTA  03/13/23 5:03 PM [facility] [address] [place], [place], [address] Phone: [phone]   Fax:  [phone]

## 2023-03-14 ENCOUNTER — Other Ambulatory Visit (HOSPITAL_COMMUNITY): Payer: Self-pay

## 2023-03-14 MED ORDER — AZITHROMYCIN 250 MG PO TABS
ORAL_TABLET | ORAL | 0 refills | Status: AC
  Filled 2023-03-14: qty 6, 5d supply, fill #0

## 2023-03-17 ENCOUNTER — Ambulatory Visit (HOSPITAL_BASED_OUTPATIENT_CLINIC_OR_DEPARTMENT_OTHER): Payer: Medicare Other

## 2023-03-17 ENCOUNTER — Encounter (HOSPITAL_BASED_OUTPATIENT_CLINIC_OR_DEPARTMENT_OTHER): Payer: Self-pay

## 2023-03-17 DIAGNOSIS — M25652 Stiffness of left hip, not elsewhere classified: Secondary | ICD-10-CM

## 2023-03-17 DIAGNOSIS — M25552 Pain in left hip: Secondary | ICD-10-CM

## 2023-03-17 DIAGNOSIS — G4733 Obstructive sleep apnea (adult) (pediatric): Secondary | ICD-10-CM | POA: Diagnosis not present

## 2023-03-17 DIAGNOSIS — R2689 Other abnormalities of gait and mobility: Secondary | ICD-10-CM

## 2023-03-17 DIAGNOSIS — J441 Chronic obstructive pulmonary disease with (acute) exacerbation: Secondary | ICD-10-CM | POA: Diagnosis not present

## 2023-03-17 NOTE — Therapy (Addendum)
 OUTPATIENT PHYSICAL THERAPY LOWER EXTREMITY TREATMENT PHYSICAL THERAPY DISCHARGE SUMMARY  Visits from Start of Care: 9  Current functional level related to goals / functional outcomes: unknown   Remaining deficits: unknown   Education / Equipment: Management of condition/HEP   Patient agrees to discharge. Patient goals were partially met. Patient is being discharged due to not returning since the last visit.  Addend Corrie Dandy Tomma Lightning) Ziemba MPT 06/12/23 12:46 PM [facility] [address] [place], [place], [address] Phone: [phone]   Fax:  [phone]    Patient Name: Michelle [name] MRN: [id number] DOB:04/29/1945, 77 y.o., female Today's Date: 03/17/2023  END OF SESSION:  PT End of Session - 03/17/23 1433     Visit Number 9    Number of Visits 16    Date for PT Re-Evaluation 03/26/23    Authorization Type BCBS MCR    PT Start Time 1432    PT Stop Time 1515    PT Time Calculation (min) 43 min    Activity Tolerance Patient tolerated treatment well    Behavior During Therapy [facility] for tasks assessed/performed                  Past Medical History:  Diagnosis Date   Acute hypoxemic respiratory failure (HCC) 04/24/2017   Anxiety    Arthritis    "back" (04/24/2017)   Atherosclerosis of coronary artery    Blurry vision, left eye    "YAG on the left; to be repaired" (04/24/2017)   Chondromalacia of knee    right knee   Chronic bronchitis (HCC)    Chronic lower back pain    DDD (degenerative disc disease) 07/24/2011   Osteoarthritis-s/p LTKA 9'52, now right planned   Depression 07/24/2011   tx. depression only   DOE (dyspnea on exertion)    Fatty liver    History of diverticulitis 02/2018   Hx of adenomatous polyp of colon 03/21/2014   Hyperlipidemia    Hypertension    IBS (irritable bowel syndrome)    OSA on CPAP 12/07/2016   Pneumonia 1990s X 1   SOB (shortness of breath)    Vitamin D deficiency     Past Surgical History:  Procedure Laterality Date   APPENDECTOMY  1967   BOTOX INJECTION     "face"   CATARACT EXTRACTION W/ INTRAOCULAR LENS IMPLANT Bilateral 2000s   CESAREAN SECTION  1972; 1975   COLONOSCOPY W/ BIOPSIES AND POLYPECTOMY  "a few"   DILATION AND CURETTAGE OF UTERUS     HYSTEROSCOPY WITH D & C  08/29/2006   and resection of endometrial polyp/notes 09/10/2010   IR IMAGING GUIDED PORT INSERTION  01/18/2021   JOINT REPLACEMENT     KNEE ARTHROSCOPY Bilateral    NASAL SEPTUM SURGERY  1972   OOPHORECTOMY Right 1994   TOTAL KNEE ARTHROPLASTY  08/05/2011   Procedure: TOTAL KNEE ARTHROPLASTY;  Surgeon: Loanne Drilling, MD;  Location: [facility];  Service: Orthopedics;  Laterality: Right;   TOTAL KNEE ARTHROPLASTY Left 05/2009   TUBAL LIGATION  1975   Patient Active Problem List   Diagnosis Date Noted   Iron overload 06/20/2022   Dyspnea 03/25/2022   Port-A-Cath in place 08/27/2021   Macrocytosis without anemia 08/30/2020   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   Cough 05/14/2017   ETOH abuse 04/27/2017   Abnormal LFTs 04/26/2017   Thrombocytopenia (HCC) 04/26/2017   Dyspnea on exertion 03/18/2017   Morbid obesity (HCC) 03/18/2017   Obstructive sleep apnea  12/07/2016   Essential hypertension 12/07/2016   Hx of adenomatous polyp of colon 03/21/2014   Postop Hyponatremia 08/07/2011   OA (osteoarthritis) of knee 08/05/2011   Pain in limb 05/15/2011    PCP: [name] MD   REFERRING PROVIDER: Eartha Inch PA  REFERRING DIAG:  Diagnosis  M25.552 (ICD-10-CM) - Pain in left hip    THERAPY DIAG:  Stiffness of left hip, not elsewhere classified  Other abnormalities of gait and mobility  Pain in left hip  Rationale for Evaluation and Treatment: Rehabilitation  ONSET DATE: about 3 to 4 months she began having increased pain.   SUBJECTIVE:   SUBJECTIVE STATEMENT: Pt reports improved walking and continued improved pain level.    From initial  Eval The patient had an acute onset of left  hip pain about 3-4 months ago. She has increased pain with standing and walking. She had an injection a few weeks ago which helped for a short period of time.    PERTINENT HISTORY: DDD, chronic low back pain, shortness of breath, shoulder pain  PAIN:  Are you having pain? Yes: NPRS scale: 2/10 Pain location: left lateral hip  Pain description: aching  Aggravating factors: standing and walking  Relieving factors: rest/ ice   PRECAUTIONS: None  RED FLAGS: None   WEIGHT BEARING RESTRICTIONS: No  FALLS:  Has patient fallen in last 6 months? No  LIVING ENVIRONMENT: 2 steps into the front door other wise 1 level OCCUPATION:  Retired   Hobbies:  Nothing that the hip is preventing her from doing Likes to dance    PLOF: Independent  PATIENT GOALS:  To be able to walk without pain. To be able to keep up with friends on cruise.    NEXT MD VISIT:   OBJECTIVE:  Note: Objective measures were completed at Evaluation unless otherwise noted.  DIAGNOSTIC FINDINGS:  Left knee: (-)   PATIENT SURVEYS:  FOTO   FOTO: 33% on 11/11 COGNITION: Overall cognitive status: Within functional limits for tasks assessed     SENSATION: WFL     POSTURE: No Significant postural limitations  PALPATION: Tender to palpation in the lateral hip around the brusa  LOWER EXTREMITY ROM:  Passive ROM Right eval Left eval  Hip flexion Painful at end range   Hip extension    Hip abduction    Hip adduction    Hip internal rotation No pain   Hip external rotation No pain    Knee flexion    Knee extension    Ankle dorsiflexion    Ankle plantarflexion    Ankle inversion    Ankle eversion     (Blank rows = not tested)  LOWER EXTREMITY MMT:  MMT Right eval Left eval Right 11/11 Left 11/11  Hip flexion 16.3 10.3 23.4 26.1  Hip extension      Hip abduction 19.8 16.9 32.3 27.6 (seated)  Hip adduction      Hip internal rotation      Hip  external rotation      Knee flexion      Knee extension 31 26.9 37.6 31.6  Ankle dorsiflexion      Ankle plantarflexion      Ankle inversion      Ankle eversion       (Blank rows = not tested)   GAIT: Significant lateral movement away from the left hip. Trendelenburg gait. Significant improvement in gait with a cane on the right side  TODAY'S TREATMENT:  DATE:     11/25 LTR x15 Piriformis stretch (gentle) 30sec x2ea/bil Thomas stretch with knee flexion 3x30sec L ITB stretch over endge of table in sidleying 30sec x2L STM to L gluteal mm Supine march 2x10ea Gait in hall Standing SLR flexion 2x10ea Standing hip extension 2x10ea Sidestepping at rail x 3 laps  PATIENT EDUCATION:  Education details: TENS information; ktape rationale and safe removal technique,  Person educated: Patient Education method: Explanation, Demonstration, Tactile cues, Verbal cues, and Handouts Education comprehension: verbalized understanding, returned demonstration, verbal cues required, tactile cues required, and needs further education  HOME EXERCISE PROGRAM: Access Code: [email or web address] URL: [email or web address] Date: 01/30/2023 Prepared by: Lorayne Bender  Exercises - Supine Piriformis Stretch with Foot on Ground  - 1 x daily - 7 x weekly - 3 sets - 3 reps - 20sec  hold - Supine Lower Trunk Rotation  - 1 x daily - 7 x weekly - 3 sets - 10 reps - Theracane Over Shoulder  - 1 x daily - 7 x weekly - 3 sets - 10 reps - Seated Hip Abduction with Resistance  - 1 x daily - 7 x weekly - 3 sets - 10 reps  ASSESSMENT:  CLINICAL IMPRESSION: Pt making progress towards goals, though still has difficulty with stair climbing. Remains significantly tender to palpation of gluteal mm. Spent time on STM to decrease this. Mild discomfort with sidestepping leading with R LE. Pt will  be travelling to [place] until 12/9. Will schedule additional PT upon her return.   Eval:Patient is a 77 year old female with progressive onset of left hip pain.  She has significant spasming in her glutes and down her IT band.  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  She has an antalgic gait.  She had improved pain with use of a cane.  She has a cane at home and plans to start using it for short-term.  She has weakness on the left compared to right.  She would benefit from skilled therapy to improve ability to ambulate and perform daily tasks without increased pain. OBJECTIVE IMPAIRMENTS: Abnormal gait, decreased activity tolerance, difficulty walking, decreased ROM, decreased strength, and pain.   ACTIVITY LIMITATIONS: carrying, bending, standing, squatting, stairs, transfers, and locomotion level  PARTICIPATION LIMITATIONS: meal prep, cleaning, driving, shopping, community activity, and yard work  PERSONAL FACTORS: 1-2 comorbidities: baseline left knee pain; baseline shoulder issues   are also affecting patient's functional outcome.   REHAB POTENTIAL: Good  CLINICAL DECISION MAKING: Evolving/moderate complexity  EVALUATION COMPLEXITY: Moderate   GOALS: Goals reviewed with patient? Yes  SHORT TERM GOALS: Target date: 02/27/2023   Patient will increase gross left lower extremity strength by 5 pounds Baseline: Goal status: MET 11/11  2.  Patient will ambulate 300 feet with least restrictive assistive device without increased pain Baseline:  Goal status: MET 11/25  3.  Patient will report a 50% reduction in tenderness to palpation of the left gluteal Baseline:  Goal status: NOT MET ("about the same")  4.  Patient will be patient will be independent with basic HEP Baseline:  Goal status: MET  LONG TERM GOALS: Target date: 03/27/2023    Patient will walk community distances without increased pain Baseline:  Goal status: MET 11/25  2.   Patient will go up and down 6 steps with reciprocal gait without increased pain in order to improve community access Baseline:  Goal status: NOT MET ("about the same") 11/25  3.  Patient will have complete exercise  program to continue progressive strengthening of left hip Baseline:  Goal status: INITIAL  PLAN:  PT FREQUENCY: 2x/week  PT DURATION: 8 weeks  PLANNED INTERVENTIONS: Therapeutic exercises, Therapeutic activity, Neuromuscular re-education, Balance training, Gait training, Patient/Family education, Self Care, Joint mobilization, Stair training, DME instructions, Aquatic Therapy, Dry Needling, Electrical stimulation, Cryotherapy, Moist heat, Taping, Manual therapy, and Re-evaluation.   PLAN FOR NEXT SESSION: Consider trigger point dry needling to left glute. Consider continued soft tissue mobilization to the left gluteal advance exercise program.  Riki Altes, PTA  03/17/23 4:14 PM [facility] [address] [place], [place], [address] Phone: [phone]   Fax:  [phone]

## 2023-03-18 DIAGNOSIS — M545 Low back pain, unspecified: Secondary | ICD-10-CM | POA: Diagnosis not present

## 2023-03-18 DIAGNOSIS — M7062 Trochanteric bursitis, left hip: Secondary | ICD-10-CM | POA: Diagnosis not present

## 2023-04-03 ENCOUNTER — Other Ambulatory Visit (HOSPITAL_COMMUNITY): Payer: Self-pay

## 2023-04-04 ENCOUNTER — Other Ambulatory Visit (HOSPITAL_COMMUNITY): Payer: Self-pay

## 2023-04-04 DIAGNOSIS — Z03818 Encounter for observation for suspected exposure to other biological agents ruled out: Secondary | ICD-10-CM | POA: Diagnosis not present

## 2023-04-04 DIAGNOSIS — J029 Acute pharyngitis, unspecified: Secondary | ICD-10-CM | POA: Diagnosis not present

## 2023-04-04 DIAGNOSIS — R52 Pain, unspecified: Secondary | ICD-10-CM | POA: Diagnosis not present

## 2023-04-04 DIAGNOSIS — R051 Acute cough: Secondary | ICD-10-CM | POA: Diagnosis not present

## 2023-04-04 DIAGNOSIS — R5383 Other fatigue: Secondary | ICD-10-CM | POA: Diagnosis not present

## 2023-04-04 DIAGNOSIS — H608X2 Other otitis externa, left ear: Secondary | ICD-10-CM | POA: Diagnosis not present

## 2023-04-04 MED ORDER — OFLOXACIN 0.3 % OT SOLN
10.0000 [drp] | Freq: Every day | OTIC | 0 refills | Status: DC
  Filled 2023-04-04: qty 5, 10d supply, fill #0

## 2023-04-07 DIAGNOSIS — Z79899 Other long term (current) drug therapy: Secondary | ICD-10-CM | POA: Diagnosis not present

## 2023-04-07 DIAGNOSIS — L2989 Other pruritus: Secondary | ICD-10-CM | POA: Diagnosis not present

## 2023-04-07 DIAGNOSIS — L270 Generalized skin eruption due to drugs and medicaments taken internally: Secondary | ICD-10-CM | POA: Diagnosis not present

## 2023-04-07 DIAGNOSIS — L814 Other melanin hyperpigmentation: Secondary | ICD-10-CM | POA: Diagnosis not present

## 2023-04-08 DIAGNOSIS — H524 Presbyopia: Secondary | ICD-10-CM | POA: Diagnosis not present

## 2023-04-14 DIAGNOSIS — J441 Chronic obstructive pulmonary disease with (acute) exacerbation: Secondary | ICD-10-CM | POA: Diagnosis not present

## 2023-04-14 DIAGNOSIS — G4733 Obstructive sleep apnea (adult) (pediatric): Secondary | ICD-10-CM | POA: Diagnosis not present

## 2023-04-16 DIAGNOSIS — G4733 Obstructive sleep apnea (adult) (pediatric): Secondary | ICD-10-CM | POA: Diagnosis not present

## 2023-04-16 DIAGNOSIS — J441 Chronic obstructive pulmonary disease with (acute) exacerbation: Secondary | ICD-10-CM | POA: Diagnosis not present

## 2023-04-25 DIAGNOSIS — H9202 Otalgia, left ear: Secondary | ICD-10-CM | POA: Diagnosis not present

## 2023-04-25 DIAGNOSIS — M542 Cervicalgia: Secondary | ICD-10-CM | POA: Diagnosis not present

## 2023-04-25 DIAGNOSIS — J029 Acute pharyngitis, unspecified: Secondary | ICD-10-CM | POA: Diagnosis not present

## 2023-04-28 ENCOUNTER — Inpatient Hospital Stay: Payer: Medicare Other | Attending: Hematology and Oncology

## 2023-04-28 DIAGNOSIS — Z95828 Presence of other vascular implants and grafts: Secondary | ICD-10-CM

## 2023-04-28 LAB — CBC (CANCER CENTER ONLY)
HCT: 41 % (ref 36.0–46.0)
Hemoglobin: 14.7 g/dL (ref 12.0–15.0)
MCH: 36.1 pg — ABNORMAL HIGH (ref 26.0–34.0)
MCHC: 35.9 g/dL (ref 30.0–36.0)
MCV: 100.7 fL — ABNORMAL HIGH (ref 80.0–100.0)
Platelet Count: 186 10*3/uL (ref 150–400)
RBC: 4.07 MIL/uL (ref 3.87–5.11)
RDW: 12.2 % (ref 11.5–15.5)
WBC Count: 7.2 10*3/uL (ref 4.0–10.5)
nRBC: 0 % (ref 0.0–0.2)

## 2023-04-29 ENCOUNTER — Inpatient Hospital Stay: Payer: Medicare Other

## 2023-04-29 NOTE — Progress Notes (Signed)
 Ok to treat with phlebotomy today- hemoglobin is 14.7, hematocrit 41.0. No ferritin was drawn- Dr. Loretha aware. Patient refused use of her PAC and refused IVF. 20g was used in the vein at the top of her R AC. Started at 1450 it lasted until 1503- 515g were removed. Patient stable and without any issues. Offered food and fluids- drank well.  VSS- BP 118/78 (BP Location: Left Wrist, Patient Position: Sitting)   Pulse 81   Temp 97.6 F (36.4 C) (Oral)   Resp 16   SpO2 97%   Ambulatory with no complaints.

## 2023-05-16 ENCOUNTER — Other Ambulatory Visit (HOSPITAL_COMMUNITY): Payer: Self-pay

## 2023-05-17 DIAGNOSIS — G4733 Obstructive sleep apnea (adult) (pediatric): Secondary | ICD-10-CM | POA: Diagnosis not present

## 2023-05-17 DIAGNOSIS — J441 Chronic obstructive pulmonary disease with (acute) exacerbation: Secondary | ICD-10-CM | POA: Diagnosis not present

## 2023-05-22 DIAGNOSIS — E1169 Type 2 diabetes mellitus with other specified complication: Secondary | ICD-10-CM | POA: Diagnosis not present

## 2023-05-22 DIAGNOSIS — E785 Hyperlipidemia, unspecified: Secondary | ICD-10-CM | POA: Diagnosis not present

## 2023-05-22 DIAGNOSIS — E559 Vitamin D deficiency, unspecified: Secondary | ICD-10-CM | POA: Diagnosis not present

## 2023-05-22 DIAGNOSIS — Z79899 Other long term (current) drug therapy: Secondary | ICD-10-CM | POA: Diagnosis not present

## 2023-05-26 ENCOUNTER — Other Ambulatory Visit (HOSPITAL_COMMUNITY): Payer: Self-pay

## 2023-05-26 ENCOUNTER — Encounter: Payer: Self-pay | Admitting: Hematology and Oncology

## 2023-05-26 DIAGNOSIS — N183 Chronic kidney disease, stage 3 unspecified: Secondary | ICD-10-CM | POA: Diagnosis not present

## 2023-05-26 DIAGNOSIS — E785 Hyperlipidemia, unspecified: Secondary | ICD-10-CM | POA: Diagnosis not present

## 2023-05-26 DIAGNOSIS — Z79899 Other long term (current) drug therapy: Secondary | ICD-10-CM | POA: Diagnosis not present

## 2023-05-26 DIAGNOSIS — F331 Major depressive disorder, recurrent, moderate: Secondary | ICD-10-CM | POA: Diagnosis not present

## 2023-05-26 DIAGNOSIS — E1169 Type 2 diabetes mellitus with other specified complication: Secondary | ICD-10-CM | POA: Diagnosis not present

## 2023-05-26 DIAGNOSIS — Z0000 Encounter for general adult medical examination without abnormal findings: Secondary | ICD-10-CM | POA: Diagnosis not present

## 2023-05-26 DIAGNOSIS — F1021 Alcohol dependence, in remission: Secondary | ICD-10-CM | POA: Diagnosis not present

## 2023-05-26 MED ORDER — MOUNJARO 10 MG/0.5ML ~~LOC~~ SOAJ
10.0000 mg | SUBCUTANEOUS | 5 refills | Status: DC
  Filled 2023-05-26: qty 2, 28d supply, fill #0

## 2023-05-29 ENCOUNTER — Encounter: Payer: Self-pay | Admitting: Family Medicine

## 2023-05-30 ENCOUNTER — Other Ambulatory Visit (HOSPITAL_COMMUNITY): Payer: Self-pay

## 2023-05-30 MED ORDER — JARDIANCE 10 MG PO TABS
10.0000 mg | ORAL_TABLET | Freq: Every day | ORAL | 3 refills | Status: DC
  Filled 2023-05-30: qty 30, 30d supply, fill #0
  Filled 2023-07-31: qty 30, 30d supply, fill #1

## 2023-06-02 ENCOUNTER — Telehealth: Payer: Self-pay

## 2023-06-02 NOTE — Telephone Encounter (Signed)
 Left message on voicemail to confirm patient appointment on 06/03/23.

## 2023-06-03 ENCOUNTER — Inpatient Hospital Stay: Payer: Medicare Other | Attending: Hematology and Oncology | Admitting: Hematology and Oncology

## 2023-06-03 ENCOUNTER — Encounter: Payer: Self-pay | Admitting: Hematology and Oncology

## 2023-06-03 DIAGNOSIS — Z87891 Personal history of nicotine dependence: Secondary | ICD-10-CM | POA: Diagnosis not present

## 2023-06-03 NOTE — Progress Notes (Signed)
[facility] Cancer Follow up:    [name], MD 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 female returns for follow-up of her history of iron overload.   Discussed the use of AI scribe software for clinical note transcription with the patient, who gave verbal consent to proceed.  History of Present Illness    Michelle [name] "Dot" is a 78 year old female who presents with elevated ferritin levels. She was referred by Dr. Laurine Blazer for evaluation of her ferritin levels.  She presents for follow-up regarding her elevated ferritin levels. Her ferritin level was 199 prior to January 30th, when it decreased to 138.  She mentions difficulty with phlebotomies, although her last phlebotomy in December was successful and completed in 13 minutes.  She experiences itching, which has improved slightly over time. She also has a history of a rash that was previously severe but has since improved significantly.  She has been on Ozempic at a dosage of 2 mg and has lost a total of 33 pounds, reducing her weight from 236 pounds to 203 pounds.   She reports occasional soreness in her back, which she describes as musculoskeletal in nature. No issues with urination but mentions occasional constipation for which she uses laxatives.  Rest of the pertinent 10 point ROS reviewed and neg.  Patient Active Problem List   Diagnosis Date Noted   Iron overload 06/20/2022   Dyspnea 03/25/2022   Port-A-Cath in place 08/27/2021    Macrocytosis without anemia 08/30/2020   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   Cough 05/14/2017   ETOH abuse 04/27/2017   Abnormal LFTs 04/26/2017   Thrombocytopenia (HCC) 04/26/2017   Dyspnea on exertion 03/18/2017   Morbid obesity (HCC) 03/18/2017   Obstructive sleep apnea 12/07/2016   Essential hypertension 12/07/2016   Hx of adenomatous polyp of colon 03/21/2014   Postop Hyponatremia 08/07/2011   OA (osteoarthritis) of knee 08/05/2011   Pain in limb 05/15/2011    has no known allergies.  MEDICAL HISTORY: Past Medical History:  Diagnosis Date   Acute hypoxemic respiratory failure (HCC) 04/24/2017   Anxiety    Arthritis    "back" (04/24/2017)   Atherosclerosis of coronary artery    Blurry vision, left eye    "YAG on the left; to be repaired" (04/24/2017)   Chondromalacia of knee    right knee   Chronic bronchitis (HCC)    Chronic lower back pain    DDD (degenerative disc disease) 07/24/2011   Osteoarthritis-s/p LTKA 5'78, now right planned   Depression 07/24/2011   tx. depression only   DOE (dyspnea on exertion)    Fatty liver    History of diverticulitis 02/2018   Hx of adenomatous polyp of colon 03/21/2014   Hyperlipidemia    Hypertension    IBS (irritable bowel syndrome)    OSA on CPAP 12/07/2016   Pneumonia 1990s X 1   SOB (shortness of breath)    Vitamin D deficiency     SURGICAL HISTORY: Past Surgical History:  Procedure Laterality Date   APPENDECTOMY  1967   BOTOX INJECTION     "  face"   CATARACT EXTRACTION W/ INTRAOCULAR LENS IMPLANT Bilateral 2000s   CESAREAN SECTION  1972; 1975   COLONOSCOPY W/ BIOPSIES AND POLYPECTOMY  "a few"   DILATION AND CURETTAGE OF UTERUS     HYSTEROSCOPY WITH D & C  08/29/2006   and resection of endometrial polyp/notes 09/10/2010   IR IMAGING GUIDED PORT INSERTION  01/18/2021   JOINT REPLACEMENT     KNEE ARTHROSCOPY Bilateral    NASAL SEPTUM SURGERY  1972   OOPHORECTOMY Right 1994   TOTAL KNEE ARTHROPLASTY   08/05/2011   Procedure: TOTAL KNEE ARTHROPLASTY;  Surgeon: Loanne Drilling, MD;  Location: [facility];  Service: Orthopedics;  Laterality: Right;   TOTAL KNEE ARTHROPLASTY Left 05/2009   TUBAL LIGATION  1975    SOCIAL HISTORY: Social History   Socioeconomic History   Marital status: Divorced    Spouse name: Not on file   Number of children: 2   Years of education: Not on file   Highest education level: Not on file  Occupational History   Occupation: Retired Haematologist  Tobacco Use   Smoking status: Former    Current packs/day: 0.00    Average packs/day: 0.1 packs/day for 36.0 years (4.3 ttl pk-yrs)    Types: Cigarettes    Start date: 06/19/1978    Quit date: 06/19/2014    Years since quitting: 8.9   Smokeless tobacco: Never  Vaping Use   Vaping status: Never Used  Substance and Sexual Activity   Alcohol use: Yes    Alcohol/week: 21.0 standard drinks of alcohol    Types: 21 Glasses of wine per week    Comment: 04/24/2017 "3, glasses of white wine qd"   Drug use: No   Sexual activity: Never  Other Topics Concern   Not on file  Social History Narrative   Not on file   Social Drivers of Health   Financial Resource Strain: Not on file  Food Insecurity: Not on file  Transportation Needs: Not on file  Physical Activity: Not on file  Stress: Not on file  Social Connections: Not on file  Intimate Partner Violence: Not on file    FAMILY HISTORY: Family History  Problem Relation Age of Onset   Heart disease Mother        Rheumatic heart disease   Pulmonary embolism Mother    Heart disease Father        valvular heart disease   Stroke Father    Hypertension Sister    Colon cancer Neg Hx    Esophageal cancer Neg Hx    Rectal cancer Neg Hx    Stomach cancer Neg Hx    Pancreatic cancer Neg Hx     Review of Systems  Constitutional:  Negative for appetite change, chills, fatigue, fever and unexpected weight change.  HENT:   Negative for hearing loss, lump/mass and trouble  swallowing.   Eyes:  Negative for eye problems and icterus.  Respiratory:  Negative for chest tightness, cough and shortness of breath.   Cardiovascular:  Negative for chest pain, leg swelling and palpitations.  Gastrointestinal:  Negative for abdominal distention, abdominal pain, constipation, diarrhea, nausea and vomiting.  Endocrine: Negative for hot flashes.  Genitourinary:  Negative for difficulty urinating.   Musculoskeletal:  Negative for arthralgias.  Skin:  Negative for itching and rash.  Neurological:  Negative for dizziness, extremity weakness, headaches and numbness.  Hematological:  Negative for adenopathy. Does not bruise/bleed easily.  Psychiatric/Behavioral:  Negative for depression. The patient  is not nervous/anxious.       PHYSICAL EXAMINATION  ECOG PERFORMANCE STATUS: 0 - Asymptomatic  Vitals:   06/03/23 1446  BP: 122/67  Pulse: 75  Resp: 16  Temp: (!) 97.3 F (36.3 C)  SpO2: 96%     Physical Exam Constitutional:      General: She is not in acute distress.    Appearance: Normal appearance. She is not toxic-appearing.  HENT:     Head: Normocephalic and atraumatic.  Eyes:     General: No scleral icterus. Cardiovascular:     Rate and Rhythm: Normal rate and regular rhythm.     Pulses: Normal pulses.     Heart sounds: Normal heart sounds.  Pulmonary:     Effort: Pulmonary effort is normal.     Breath sounds: Normal breath sounds.  Abdominal:     General: Abdomen is flat. Bowel sounds are normal. There is no distension.     Palpations: Abdomen is soft.     Tenderness: There is no abdominal tenderness.  Musculoskeletal:        General: No swelling.     Cervical back: Neck supple.  Lymphadenopathy:     Cervical: No cervical adenopathy.  Skin:    General: Skin is warm and dry.     Findings: No rash.  Neurological:     General: No focal deficit present.     Mental Status: She is alert.  Psychiatric:        Mood and Affect: Mood normal.         Behavior: Behavior normal.     LABORATORY DATA:  CBC    Component Value Date/Time   WBC 7.2 04/28/2023 1357   WBC 4.3 09/23/2022 1431   RBC 4.07 04/28/2023 1357   HGB 14.7 04/28/2023 1357   HGB 13.7 10/04/2021 1424   HCT 41.0 04/28/2023 1357   HCT 40.4 10/04/2021 1424   PLT 186 04/28/2023 1357   MCV 100.7 (H) 04/28/2023 1357   MCV 99 (H) 02/18/2018 1217   MCH 36.1 (H) 04/28/2023 1357   MCHC 35.9 04/28/2023 1357   RDW 12.2 04/28/2023 1357   RDW 12.1 (L) 02/18/2018 1217   LYMPHSABS 1.4 01/08/2023 1234   LYMPHSABS 1.7 02/18/2018 1217   MONOABS 0.6 01/08/2023 1234   EOSABS 0.1 01/08/2023 1234   EOSABS 0.2 02/18/2018 1217   BASOSABS 0.1 01/08/2023 1234   BASOSABS 0.1 02/18/2018 1217    CMP     Component Value Date/Time   NA 137 01/08/2023 1234   NA 138 10/04/2021 1424   K 4.2 01/08/2023 1234   CL 104 01/08/2023 1234   CO2 24 01/08/2023 1234   GLUCOSE 109 (H) 01/08/2023 1234   BUN 28 (H) 01/08/2023 1234   BUN 21 10/04/2021 1424   CREATININE 1.14 (H) 01/08/2023 1234   CALCIUM 9.1 01/08/2023 1234   PROT 7.2 01/08/2023 1234   PROT 7.0 02/18/2018 1217   ALBUMIN 4.1 01/08/2023 1234   ALBUMIN 4.4 02/18/2018 1217   AST 21 01/08/2023 1234   ALT 28 01/08/2023 1234   ALKPHOS 71 01/08/2023 1234   BILITOT 1.1 01/08/2023 1234   GFRNONAA 50 (L) 01/08/2023 1234   GFRAA >60 08/09/2018 1835    ASSESSMENT and THERAPY PLAN:   Hereditary hemochromatosis (HCC) Iron Overload Ferritin levels have decreased from 199 to 138. Patient has had successful phlebotomies in the past. -Schedule another phlebotomy in the next couple of weeks. -Draw metabolic panel, ferritin on the day of the phlebotomy. -No need  to wait for ferritin the day of phlebotomy.  Port Removal Patient has a port that is no longer needed. -Arrange for outpatient port removal.  Weight Loss Patient has lost 33 pounds and is trying to lose more. -Continue current weight loss efforts.  Medication Review Patient  is on Ozempic 2mg  and has stopped taking Tramadol and Mounjaro. -Remove Tramadol and Mounjaro from medication list. -Continue Ozempic 2mg .  Follow-up Patient to return in approximately four months.     All questions were answered. The patient knows to call the clinic with any problems, questions or concerns. We can certainly see the patient much sooner if necessary.  Total encounter time:30 minutes*in face-to-face visit time, chart review, lab review, care coordination, order entry, and documentation of the encounter time.  *Total Encounter Time as defined by the Centers for Medicare and Medicaid Services includes, in addition to the face-to-face time of a patient visit (documented in the note above) non-face-to-face time: obtaining and reviewing outside history, ordering and reviewing medications, tests or procedures, care coordination (communications with other health care professionals or caregivers) and documentation in the medical record.

## 2023-06-03 NOTE — Assessment & Plan Note (Addendum)
Iron Overload Ferritin levels have decreased from 199 to 138. Patient has had successful phlebotomies in the past. -Schedule another phlebotomy in the next couple of weeks. -Draw metabolic panel, ferritin on the day of the phlebotomy. -No need to wait for ferritin the day of phlebotomy.  Port Removal Patient has a port that is no longer needed. -Arrange for outpatient port removal.  Weight Loss Patient has lost 33 pounds and is trying to lose more. -Continue current weight loss efforts.  Medication Review Patient is on Ozempic 2mg  and has stopped taking Tramadol and Mounjaro. -Remove Tramadol and Mounjaro from medication list. -Continue Ozempic 2mg .  Follow-up Patient to return in approximately four months.

## 2023-06-12 ENCOUNTER — Ambulatory Visit (HOSPITAL_COMMUNITY)
Admission: RE | Admit: 2023-06-12 | Discharge: 2023-06-12 | Disposition: A | Discharge status: Home / Self Care | Payer: Medicare Other | Source: Ambulatory Visit | Attending: Hematology and Oncology | Admitting: Hematology and Oncology

## 2023-06-12 DIAGNOSIS — Z452 Encounter for adjustment and management of vascular access device: Secondary | ICD-10-CM | POA: Insufficient documentation

## 2023-06-12 HISTORY — PX: IR REMOVAL TUN ACCESS W/ PORT W/O FL MOD SED: IMG2290

## 2023-06-12 MED ORDER — LIDOCAINE HCL 1 % IJ SOLN
INTRAMUSCULAR | Status: AC
  Filled 2023-06-12: qty 20

## 2023-06-12 MED ORDER — LIDOCAINE-EPINEPHRINE 1 %-1:100000 IJ SOLN
20.0000 mL | Freq: Once | INTRAMUSCULAR | Status: AC
  Administered 2023-06-12: 20 mL via INTRADERMAL

## 2023-06-12 MED ORDER — LIDOCAINE-EPINEPHRINE 1 %-1:100000 IJ SOLN
INTRAMUSCULAR | Status: AC
  Filled 2023-06-12: qty 1

## 2023-06-12 NOTE — Procedures (Signed)
 Interventional Radiology Procedure:   Indications:  Hemochromatosis and port is no longer needed  Procedure: Port removal  Findings: Complete removal of right chest port.  Complications: None     EBL: Minimal  Plan: Keep incision dry for 24 hours  [name] R. Lowella Dandy, MD  Pager: [phone]

## 2023-06-17 ENCOUNTER — Inpatient Hospital Stay: Payer: Medicare Other

## 2023-06-17 DIAGNOSIS — Z87891 Personal history of nicotine dependence: Secondary | ICD-10-CM | POA: Diagnosis not present

## 2023-06-17 DIAGNOSIS — Z95828 Presence of other vascular implants and grafts: Secondary | ICD-10-CM

## 2023-06-17 LAB — IRON AND IRON BINDING CAPACITY (CC-WL,HP ONLY)
Iron: 117 ug/dL (ref 28–170)
Saturation Ratios: 32 % — ABNORMAL HIGH (ref 10.4–31.8)
TIBC: 371 ug/dL (ref 250–450)
UIBC: 254 ug/dL (ref 148–442)

## 2023-06-17 LAB — CBC WITH DIFFERENTIAL/PLATELET
Abs Immature Granulocytes: 0.01 10*3/uL (ref 0.00–0.07)
Basophils Absolute: 0.1 10*3/uL (ref 0.0–0.1)
Basophils Relative: 1 %
Eosinophils Absolute: 0.1 10*3/uL (ref 0.0–0.5)
Eosinophils Relative: 2 %
HCT: 41.4 % (ref 36.0–46.0)
Hemoglobin: 14.8 g/dL (ref 12.0–15.0)
Immature Granulocytes: 0 %
Lymphocytes Relative: 36 %
Lymphs Abs: 2 10*3/uL (ref 0.7–4.0)
MCH: 36.2 pg — ABNORMAL HIGH (ref 26.0–34.0)
MCHC: 35.7 g/dL (ref 30.0–36.0)
MCV: 101.2 fL — ABNORMAL HIGH (ref 80.0–100.0)
Monocytes Absolute: 0.6 10*3/uL (ref 0.1–1.0)
Monocytes Relative: 11 %
Neutro Abs: 2.8 10*3/uL (ref 1.7–7.7)
Neutrophils Relative %: 50 %
Platelets: 194 10*3/uL (ref 150–400)
RBC: 4.09 MIL/uL (ref 3.87–5.11)
RDW: 11.6 % (ref 11.5–15.5)
WBC: 5.5 10*3/uL (ref 4.0–10.5)
nRBC: 0 % (ref 0.0–0.2)

## 2023-06-17 LAB — CMP (CANCER CENTER ONLY)
ALT: 33 U/L (ref 0–44)
AST: 29 U/L (ref 15–41)
Albumin: 4.3 g/dL (ref 3.5–5.0)
Alkaline Phosphatase: 65 U/L (ref 38–126)
Anion gap: 7 (ref 5–15)
BUN: 18 mg/dL (ref 8–23)
CO2: 27 mmol/L (ref 22–32)
Calcium: 9.4 mg/dL (ref 8.9–10.3)
Chloride: 105 mmol/L (ref 98–111)
Creatinine: 1.03 mg/dL — ABNORMAL HIGH (ref 0.44–1.00)
GFR, Estimated: 56 mL/min — ABNORMAL LOW (ref >60)
Glucose, Bld: 119 mg/dL — ABNORMAL HIGH (ref 70–99)
Potassium: 4.3 mmol/L (ref 3.5–5.1)
Sodium: 139 mmol/L (ref 135–145)
Total Bilirubin: 0.8 mg/dL (ref 0.0–1.2)
Total Protein: 7.2 g/dL (ref 6.5–8.1)

## 2023-06-17 NOTE — Progress Notes (Signed)
 Michelle [name] presents today for phlebotomy per MD orders. Phlebotomy procedure started at 1619 and ended at 1634. 500 grams removed. 20G IV in R lateral arm.  Patient observed for 30 minutes after procedure without any incident. Patient tolerated procedure well. Food and beverage offered to patient.  IV needle removed intact.

## 2023-06-17 NOTE — Patient Instructions (Signed)

## 2023-06-18 LAB — FERRITIN: Ferritin: 155 ng/mL (ref 11–307)

## 2023-07-01 DIAGNOSIS — Z6841 Body Mass Index (BMI) 40.0 and over, adult: Secondary | ICD-10-CM | POA: Diagnosis not present

## 2023-07-01 DIAGNOSIS — Z01419 Encounter for gynecological examination (general) (routine) without abnormal findings: Secondary | ICD-10-CM | POA: Diagnosis not present

## 2023-07-01 DIAGNOSIS — Z1231 Encounter for screening mammogram for malignant neoplasm of breast: Secondary | ICD-10-CM | POA: Diagnosis not present

## 2023-07-10 ENCOUNTER — Other Ambulatory Visit (HOSPITAL_COMMUNITY): Payer: Self-pay

## 2023-07-10 MED ORDER — METFORMIN HCL ER 500 MG PO TB24
500.0000 mg | ORAL_TABLET | Freq: Every day | ORAL | 0 refills | Status: DC
  Filled 2023-07-10: qty 30, 30d supply, fill #0

## 2023-07-13 DIAGNOSIS — G4733 Obstructive sleep apnea (adult) (pediatric): Secondary | ICD-10-CM | POA: Diagnosis not present

## 2023-07-13 DIAGNOSIS — J441 Chronic obstructive pulmonary disease with (acute) exacerbation: Secondary | ICD-10-CM | POA: Diagnosis not present

## 2023-07-31 ENCOUNTER — Other Ambulatory Visit (HOSPITAL_COMMUNITY): Payer: Self-pay

## 2023-08-04 ENCOUNTER — Other Ambulatory Visit (HOSPITAL_COMMUNITY): Payer: Self-pay

## 2023-08-04 ENCOUNTER — Other Ambulatory Visit: Payer: Self-pay

## 2023-08-04 DIAGNOSIS — E1169 Type 2 diabetes mellitus with other specified complication: Secondary | ICD-10-CM | POA: Diagnosis not present

## 2023-08-04 MED ORDER — MOUNJARO 2.5 MG/0.5ML ~~LOC~~ SOAJ
2.5000 mg | SUBCUTANEOUS | 0 refills | Status: DC
  Filled 2023-08-04: qty 2, 28d supply, fill #0

## 2023-08-05 ENCOUNTER — Other Ambulatory Visit (HOSPITAL_COMMUNITY): Payer: Self-pay

## 2023-08-13 DIAGNOSIS — G4733 Obstructive sleep apnea (adult) (pediatric): Secondary | ICD-10-CM | POA: Diagnosis not present

## 2023-09-01 ENCOUNTER — Other Ambulatory Visit (HOSPITAL_COMMUNITY): Payer: Self-pay

## 2023-09-01 MED ORDER — MOUNJARO 5 MG/0.5ML ~~LOC~~ SOAJ
5.0000 mg | SUBCUTANEOUS | 0 refills | Status: DC
  Filled 2023-09-01: qty 2, 28d supply, fill #0

## 2023-09-02 ENCOUNTER — Other Ambulatory Visit (HOSPITAL_COMMUNITY): Payer: Self-pay

## 2023-09-12 DIAGNOSIS — G4733 Obstructive sleep apnea (adult) (pediatric): Secondary | ICD-10-CM | POA: Diagnosis not present

## 2023-09-12 DIAGNOSIS — J441 Chronic obstructive pulmonary disease with (acute) exacerbation: Secondary | ICD-10-CM | POA: Diagnosis not present

## 2023-09-24 ENCOUNTER — Other Ambulatory Visit (HOSPITAL_COMMUNITY): Payer: Self-pay

## 2023-09-25 ENCOUNTER — Other Ambulatory Visit (HOSPITAL_COMMUNITY): Payer: Self-pay

## 2023-09-25 ENCOUNTER — Encounter (HOSPITAL_COMMUNITY): Payer: Self-pay

## 2023-09-26 ENCOUNTER — Other Ambulatory Visit (HOSPITAL_COMMUNITY): Payer: Self-pay

## 2023-09-26 MED ORDER — MOUNJARO 5 MG/0.5ML ~~LOC~~ SOAJ
5.0000 mg | SUBCUTANEOUS | 3 refills | Status: DC
  Filled 2023-09-26: qty 2, 28d supply, fill #0

## 2023-10-01 ENCOUNTER — Inpatient Hospital Stay: Payer: Medicare Other

## 2023-10-01 ENCOUNTER — Inpatient Hospital Stay: Payer: Medicare Other | Admitting: Hematology and Oncology

## 2023-10-06 ENCOUNTER — Telehealth: Payer: Self-pay

## 2023-10-06 NOTE — Telephone Encounter (Signed)
 Verbally confirmed appt for 6/17

## 2023-10-07 ENCOUNTER — Inpatient Hospital Stay: Attending: Hematology and Oncology

## 2023-10-07 ENCOUNTER — Inpatient Hospital Stay: Admitting: Hematology and Oncology

## 2023-10-07 ENCOUNTER — Other Ambulatory Visit: Payer: Self-pay

## 2023-10-07 DIAGNOSIS — Z87891 Personal history of nicotine dependence: Secondary | ICD-10-CM | POA: Diagnosis not present

## 2023-10-07 DIAGNOSIS — Z95828 Presence of other vascular implants and grafts: Secondary | ICD-10-CM

## 2023-10-07 LAB — CBC WITH DIFFERENTIAL/PLATELET
Abs Immature Granulocytes: 0.01 10*3/uL (ref 0.00–0.07)
Basophils Absolute: 0.1 10*3/uL (ref 0.0–0.1)
Basophils Relative: 1 %
Eosinophils Absolute: 0.1 10*3/uL (ref 0.0–0.5)
Eosinophils Relative: 2 %
HCT: 40.9 % (ref 36.0–46.0)
Hemoglobin: 14.7 g/dL (ref 12.0–15.0)
Immature Granulocytes: 0 %
Lymphocytes Relative: 35 %
Lymphs Abs: 1.9 10*3/uL (ref 0.7–4.0)
MCH: 35.3 pg — ABNORMAL HIGH (ref 26.0–34.0)
MCHC: 35.9 g/dL (ref 30.0–36.0)
MCV: 98.1 fL (ref 80.0–100.0)
Monocytes Absolute: 0.7 10*3/uL (ref 0.1–1.0)
Monocytes Relative: 12 %
Neutro Abs: 2.7 10*3/uL (ref 1.7–7.7)
Neutrophils Relative %: 50 %
Platelets: 152 10*3/uL (ref 150–400)
RBC: 4.17 MIL/uL (ref 3.87–5.11)
RDW: 11.9 % (ref 11.5–15.5)
WBC: 5.4 10*3/uL (ref 4.0–10.5)
nRBC: 0 % (ref 0.0–0.2)

## 2023-10-07 LAB — FERRITIN: Ferritin: 271 ng/mL (ref 11–307)

## 2023-10-07 NOTE — Progress Notes (Signed)
 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 female returns for follow-up of her history of iron overload.   Michelle [name] is here for follow-up.  Since her last visit here, she has been doing very well.  Her last phlebotomy was back in February and she tolerated this extremely well.  She denies any new health complaints.  She continues to lose some weight and now she is on Mounjaro .  She surprisingly notices better with the weight loss, her itching has completely resolved.  She is acceptable to doing phlebotomies periodically.  Rest of the pertinent 10 point ROS were negative  Patient Active Problem List   Diagnosis Date Noted   Iron overload 06/20/2022   Dyspnea 03/25/2022   Port-A-Cath in place 08/27/2021   Macrocytosis without anemia 08/30/2020   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   Cough 05/14/2017   ETOH abuse 04/27/2017   Abnormal LFTs 04/26/2017   Thrombocytopenia (HCC) 04/26/2017   Dyspnea on exertion 03/18/2017   Morbid obesity (HCC) 03/18/2017   Obstructive sleep apnea 12/07/2016   Essential hypertension 12/07/2016   Hx of adenomatous polyp of colon 03/21/2014   Postop Hyponatremia 08/07/2011   OA (osteoarthritis) of knee 08/05/2011   Pain in limb 05/15/2011    has no known allergies.  MEDICAL HISTORY: Past Medical History:  Diagnosis Date   Acute hypoxemic respiratory failure (HCC) 04/24/2017   Anxiety    Arthritis     back (04/24/2017)   Atherosclerosis of coronary artery    Blurry vision, left eye    YAG on the left; to be repaired (04/24/2017)   Chondromalacia of knee    right knee   Chronic bronchitis (HCC)    Chronic lower back pain    DDD (degenerative disc disease) 07/24/2011   Osteoarthritis-s/p LTKA 1'91, now right planned   Depression 07/24/2011   tx. depression only   DOE (dyspnea on exertion)    Fatty liver    History of diverticulitis 02/2018   Hx of adenomatous polyp of colon 03/21/2014   Hyperlipidemia    Hypertension    IBS (irritable bowel syndrome)    OSA on CPAP 12/07/2016   Pneumonia 1990s X 1   SOB (shortness of breath)    Vitamin D  deficiency     SURGICAL HISTORY: Past Surgical History:  Procedure Laterality Date   APPENDECTOMY  1967   BOTOX INJECTION     face   CATARACT EXTRACTION W/ INTRAOCULAR LENS IMPLANT Bilateral 2000s   CESAREAN SECTION  1972; 1975   COLONOSCOPY W/ BIOPSIES AND POLYPECTOMY  a few   DILATION AND CURETTAGE OF UTERUS     HYSTEROSCOPY WITH D & C  08/29/2006   and resection of endometrial polyp/notes 09/10/2010   IR IMAGING GUIDED PORT INSERTION  01/18/2021   IR REMOVAL TUN ACCESS W/ PORT W/O FL MOD SED  06/12/2023   JOINT REPLACEMENT     KNEE ARTHROSCOPY Bilateral    NASAL SEPTUM SURGERY  1972   OOPHORECTOMY Right 1994   TOTAL KNEE ARTHROPLASTY  08/05/2011  Procedure: TOTAL KNEE ARTHROPLASTY;  Surgeon: Aurther Blue, MD;  Location: [facility];  Service: Orthopedics;  Laterality: Right;   TOTAL KNEE ARTHROPLASTY Left 05/2009   TUBAL LIGATION  1975    SOCIAL HISTORY: Social History   Socioeconomic History   Marital status: Divorced    Spouse name: Not on file   Number of children: 2   Years of education: Not on file   Highest education level: Not on file  Occupational History   Occupation: Retired Haematologist  Tobacco Use   Smoking status: Former    Current packs/day: 0.00    Average packs/day: 0.1 packs/day for 36.0 years (4.3  ttl pk-yrs)    Types: Cigarettes    Start date: 06/19/1978    Quit date: 06/19/2014    Years since quitting: 9.3   Smokeless tobacco: Never  Vaping Use   Vaping status: Never Used  Substance and Sexual Activity   Alcohol  use: Yes    Alcohol /week: 21.0 standard drinks of alcohol     Types: 21 Glasses of wine per week    Comment: 04/24/2017 3, glasses of white wine qd   Drug use: No   Sexual activity: Never  Other Topics Concern   Not on file  Social History Narrative   Not on file   Social Drivers of Health   Financial Resource Strain: Not on file  Food Insecurity: Not on file  Transportation Needs: Not on file  Physical Activity: Not on file  Stress: Not on file  Social Connections: Not on file  Intimate Partner Violence: Not on file    FAMILY HISTORY: Family History  Problem Relation Age of Onset   Heart disease Mother        Rheumatic heart disease   Pulmonary embolism Mother    Heart disease Father        valvular heart disease   Stroke Father    Hypertension Sister    Colon cancer Neg Hx    Esophageal cancer Neg Hx    Rectal cancer Neg Hx    Stomach cancer Neg Hx    Pancreatic cancer Neg Hx     Review of Systems  Constitutional:  Negative for appetite change, chills, fatigue, fever and unexpected weight change.  HENT:   Negative for hearing loss, lump/mass and trouble swallowing.   Eyes:  Negative for eye problems and icterus.  Respiratory:  Negative for chest tightness, cough and shortness of breath.   Cardiovascular:  Negative for chest pain, leg swelling and palpitations.  Gastrointestinal:  Negative for abdominal distention, abdominal pain, constipation, diarrhea, nausea and vomiting.  Endocrine: Negative for hot flashes.  Genitourinary:  Negative for difficulty urinating.   Musculoskeletal:  Negative for arthralgias.  Skin:  Negative for itching and rash.  Neurological:  Negative for dizziness, extremity weakness, headaches and numbness.   Hematological:  Negative for adenopathy. Does not bruise/bleed easily.  Psychiatric/Behavioral:  Negative for depression. The patient is not nervous/anxious.       PHYSICAL EXAMINATION  ECOG PERFORMANCE STATUS: 0 - Asymptomatic  Vitals:   10/07/23 1149  BP: (!) 132/58  Pulse: 73  Resp: 18  Temp: 98.2 F (36.8 C)  SpO2: 97%     Physical Exam Constitutional:      General: She is not in acute distress.    Appearance: Normal appearance. She is not toxic-appearing.  HENT:     Head: Normocephalic and atraumatic.   Eyes:     General: No scleral icterus.   Cardiovascular:  Rate and Rhythm: Normal rate and regular rhythm.     Pulses: Normal pulses.     Heart sounds: Normal heart sounds.  Pulmonary:     Effort: Pulmonary effort is normal.     Breath sounds: Normal breath sounds.  Abdominal:     General: Abdomen is flat. Bowel sounds are normal. There is no distension.     Palpations: Abdomen is soft.     Tenderness: There is no abdominal tenderness.   Musculoskeletal:        General: No swelling.     Cervical back: Neck supple.  Lymphadenopathy:     Cervical: No cervical adenopathy.   Skin:    General: Skin is warm and dry.     Findings: No rash.   Neurological:     General: No focal deficit present.     Mental Status: She is alert.   Psychiatric:        Mood and Affect: Mood normal.        Behavior: Behavior normal.     LABORATORY DATA:  CBC    Component Value Date/Time   WBC 5.4 10/07/2023 1123   RBC 4.17 10/07/2023 1123   HGB 14.7 10/07/2023 1123   HGB 14.7 04/28/2023 1357   HGB 13.7 10/04/2021 1424   HCT 40.9 10/07/2023 1123   HCT 40.4 10/04/2021 1424   PLT 152 10/07/2023 1123   PLT 186 04/28/2023 1357   MCV 98.1 10/07/2023 1123   MCV 99 (H) 02/18/2018 1217   MCH 35.3 (H) 10/07/2023 1123   MCHC 35.9 10/07/2023 1123   RDW 11.9 10/07/2023 1123   RDW 12.1 (L) 02/18/2018 1217   LYMPHSABS 1.9 10/07/2023 1123   LYMPHSABS 1.7 02/18/2018 1217    MONOABS 0.7 10/07/2023 1123   EOSABS 0.1 10/07/2023 1123   EOSABS 0.2 02/18/2018 1217   BASOSABS 0.1 10/07/2023 1123   BASOSABS 0.1 02/18/2018 1217    CMP     Component Value Date/Time   NA 139 06/17/2023 1518   NA 138 10/04/2021 1424   K 4.3 06/17/2023 1518   CL 105 06/17/2023 1518   CO2 27 06/17/2023 1518   GLUCOSE 119 (H) 06/17/2023 1518   BUN 18 06/17/2023 1518   BUN 21 10/04/2021 1424   CREATININE 1.03 (H) 06/17/2023 1518   CALCIUM 9.4 06/17/2023 1518   PROT 7.2 06/17/2023 1518   PROT 7.0 02/18/2018 1217   ALBUMIN 4.3 06/17/2023 1518   ALBUMIN 4.4 02/18/2018 1217   AST 29 06/17/2023 1518   ALT 33 06/17/2023 1518   ALKPHOS 65 06/17/2023 1518   BILITOT 0.8 06/17/2023 1518   GFRNONAA 56 (L) 06/17/2023 1518   GFRAA >60 08/09/2018 1835    ASSESSMENT and THERAPY PLAN:   Hereditary hemochromatosis (HCC)  Iron Overload Last ferritin level was 155 in February.  We will arrange for another therapeutic phlebotomy. Goal ferritin is 50-100.  She will return to clinic in 3 to 4 months for repeat labs,  CBC, iron panel and ferritin needed.   Weight Loss She continues on mounjaro  for weight loss She says the itching has resolved with the weight loss.   Follow-up Patient to return in approximately four months with repeat labs.  All questions were answered. The patient knows to call the clinic with any problems, questions or concerns. We can certainly see the patient much sooner if necessary.  Total encounter time:20 minutes*in face-to-face visit time, chart review, lab review, care coordination, order entry, and documentation of the encounter time.  *  Total Encounter Time as defined by the Centers for Medicare and Medicaid Services includes, in addition to the face-to-face time of a patient visit (documented in the note above) non-face-to-face time: obtaining and reviewing outside history, ordering and reviewing medications, tests or procedures, care coordination  (communications with other health care professionals or caregivers) and documentation in the medical record.

## 2023-10-10 ENCOUNTER — Ambulatory Visit: Payer: Self-pay | Admitting: Hematology and Oncology

## 2023-10-13 DIAGNOSIS — G4733 Obstructive sleep apnea (adult) (pediatric): Secondary | ICD-10-CM | POA: Diagnosis not present

## 2023-10-13 DIAGNOSIS — J441 Chronic obstructive pulmonary disease with (acute) exacerbation: Secondary | ICD-10-CM | POA: Diagnosis not present

## 2023-10-14 DIAGNOSIS — Z96653 Presence of artificial knee joint, bilateral: Secondary | ICD-10-CM | POA: Diagnosis not present

## 2023-10-14 DIAGNOSIS — Z471 Aftercare following joint replacement surgery: Secondary | ICD-10-CM | POA: Diagnosis not present

## 2023-10-16 DIAGNOSIS — L309 Dermatitis, unspecified: Secondary | ICD-10-CM | POA: Diagnosis not present

## 2023-10-16 DIAGNOSIS — L03115 Cellulitis of right lower limb: Secondary | ICD-10-CM | POA: Diagnosis not present

## 2023-10-21 ENCOUNTER — Inpatient Hospital Stay: Attending: Hematology and Oncology

## 2023-10-21 ENCOUNTER — Other Ambulatory Visit (HOSPITAL_COMMUNITY): Payer: Self-pay

## 2023-10-21 DIAGNOSIS — Z95828 Presence of other vascular implants and grafts: Secondary | ICD-10-CM

## 2023-10-21 MED ORDER — MOUNJARO 7.5 MG/0.5ML ~~LOC~~ SOAJ
7.5000 mg | SUBCUTANEOUS | 0 refills | Status: DC
  Filled 2023-10-21: qty 2, 28d supply, fill #0

## 2023-10-21 NOTE — Patient Instructions (Signed)

## 2023-10-21 NOTE — Progress Notes (Signed)
 Michelle [name] presents today for phlebotomy per MD orders. 16G Phlebotomy kit used in RAC. Phlebotomy procedure started at 1404 and ended at 1410. 509 grams removed. Patient observed for 30 minutes after procedure without any incident. VSS at time of discharge.  Pt offered food and drink.  Patient tolerated procedure well. IV needle removed intact.

## 2023-11-12 DIAGNOSIS — J441 Chronic obstructive pulmonary disease with (acute) exacerbation: Secondary | ICD-10-CM | POA: Diagnosis not present

## 2023-11-12 DIAGNOSIS — G4733 Obstructive sleep apnea (adult) (pediatric): Secondary | ICD-10-CM | POA: Diagnosis not present

## 2023-11-17 DIAGNOSIS — M533 Sacrococcygeal disorders, not elsewhere classified: Secondary | ICD-10-CM | POA: Diagnosis not present

## 2023-11-20 ENCOUNTER — Telehealth: Payer: Self-pay | Admitting: Pharmacist

## 2023-11-20 ENCOUNTER — Other Ambulatory Visit (HOSPITAL_COMMUNITY): Payer: Self-pay

## 2023-11-20 NOTE — Progress Notes (Signed)
   11/20/2023  Patient ID: Michelle [name], female   DOB: 04-Apr-1946, 78 y.o.   MRN: [id number]  Patient appeared on med adherence list for 2025:  Followed Dr. Verena protocol for Mounjaro  refills. Called the patient regarding Mounjaro  refill.  1. Have you missed any doses?  No  2. Current dose: 0.34mL  3. Current weight: 210 lbs 4. Any side effects? Such as stomach upset, nausea, vomiting, or diarrhea? No 5. Would you like to remain on the same dose, or would you prefer to increase it if possible? Same dose for 1 more month  Please send refill for Mounjaro  7.5mg  for another month. Attached in TE. Thank you!   Aloysius Lewis, PharmD [facility]  Phone Number: [phone]

## 2023-11-21 ENCOUNTER — Other Ambulatory Visit (HOSPITAL_COMMUNITY): Payer: Self-pay

## 2023-11-21 MED ORDER — MOUNJARO 7.5 MG/0.5ML ~~LOC~~ SOAJ
7.5000 mg | SUBCUTANEOUS | 1 refills | Status: DC
  Filled 2023-11-21: qty 2, 28d supply, fill #0
  Filled 2023-12-15 – 2023-12-17 (×2): qty 2, 28d supply, fill #1

## 2023-11-24 ENCOUNTER — Other Ambulatory Visit (HOSPITAL_COMMUNITY): Payer: Self-pay

## 2023-11-25 DIAGNOSIS — M533 Sacrococcygeal disorders, not elsewhere classified: Secondary | ICD-10-CM | POA: Diagnosis not present

## 2023-12-13 DIAGNOSIS — J441 Chronic obstructive pulmonary disease with (acute) exacerbation: Secondary | ICD-10-CM | POA: Diagnosis not present

## 2023-12-13 DIAGNOSIS — G4733 Obstructive sleep apnea (adult) (pediatric): Secondary | ICD-10-CM | POA: Diagnosis not present

## 2023-12-15 ENCOUNTER — Other Ambulatory Visit (HOSPITAL_COMMUNITY): Payer: Self-pay

## 2023-12-17 ENCOUNTER — Other Ambulatory Visit (HOSPITAL_COMMUNITY): Payer: Self-pay

## 2023-12-18 ENCOUNTER — Other Ambulatory Visit (HOSPITAL_COMMUNITY): Payer: Self-pay

## 2023-12-31 ENCOUNTER — Other Ambulatory Visit (HOSPITAL_COMMUNITY): Payer: Self-pay

## 2023-12-31 DIAGNOSIS — I10 Essential (primary) hypertension: Secondary | ICD-10-CM | POA: Diagnosis not present

## 2023-12-31 DIAGNOSIS — F331 Major depressive disorder, recurrent, moderate: Secondary | ICD-10-CM | POA: Diagnosis not present

## 2023-12-31 DIAGNOSIS — E66813 Obesity, class 3: Secondary | ICD-10-CM | POA: Diagnosis not present

## 2023-12-31 MED ORDER — MOUNJARO 10 MG/0.5ML ~~LOC~~ SOAJ
10.0000 mg | SUBCUTANEOUS | 3 refills | Status: DC
  Filled 2023-12-31: qty 2, 28d supply, fill #0
  Filled 2024-02-13: qty 2, 28d supply, fill #1
  Filled 2024-03-16: qty 2, 28d supply, fill #2
  Filled 2024-04-08: qty 2, 28d supply, fill #3

## 2024-02-05 DIAGNOSIS — M7062 Trochanteric bursitis, left hip: Secondary | ICD-10-CM | POA: Diagnosis not present

## 2024-02-09 ENCOUNTER — Inpatient Hospital Stay

## 2024-02-09 ENCOUNTER — Inpatient Hospital Stay: Admitting: Hematology and Oncology

## 2024-02-11 ENCOUNTER — Inpatient Hospital Stay: Attending: Hematology and Oncology

## 2024-02-11 ENCOUNTER — Inpatient Hospital Stay: Admitting: Adult Health

## 2024-02-11 DIAGNOSIS — Z87891 Personal history of nicotine dependence: Secondary | ICD-10-CM | POA: Insufficient documentation

## 2024-02-11 LAB — CMP (CANCER CENTER ONLY)
ALT: 25 U/L (ref 0–44)
AST: 17 U/L (ref 15–41)
Albumin: 4.1 g/dL (ref 3.5–5.0)
Alkaline Phosphatase: 73 U/L (ref 38–126)
Anion gap: 6 (ref 5–15)
BUN: 26 mg/dL — ABNORMAL HIGH (ref 8–23)
CO2: 28 mmol/L (ref 22–32)
Calcium: 9.3 mg/dL (ref 8.9–10.3)
Chloride: 105 mmol/L (ref 98–111)
Creatinine: 1.04 mg/dL — ABNORMAL HIGH (ref 0.44–1.00)
GFR, Estimated: 56 mL/min — ABNORMAL LOW (ref >60)
Glucose, Bld: 107 mg/dL — ABNORMAL HIGH (ref 70–99)
Potassium: 5 mmol/L (ref 3.5–5.1)
Sodium: 139 mmol/L (ref 135–145)
Total Bilirubin: 0.7 mg/dL (ref 0.0–1.2)
Total Protein: 6.7 g/dL (ref 6.5–8.1)

## 2024-02-11 LAB — CBC WITH DIFFERENTIAL/PLATELET
Abs Immature Granulocytes: 0.02 K/uL (ref 0.00–0.07)
Basophils Absolute: 0 K/uL (ref 0.0–0.1)
Basophils Relative: 1 %
Eosinophils Absolute: 0 K/uL (ref 0.0–0.5)
Eosinophils Relative: 1 %
HCT: 41.1 % (ref 36.0–46.0)
Hemoglobin: 14.8 g/dL (ref 12.0–15.0)
Immature Granulocytes: 0 %
Lymphocytes Relative: 29 %
Lymphs Abs: 1.9 K/uL (ref 0.7–4.0)
MCH: 35 pg — ABNORMAL HIGH (ref 26.0–34.0)
MCHC: 36 g/dL (ref 30.0–36.0)
MCV: 97.2 fL (ref 80.0–100.0)
Monocytes Absolute: 0.7 K/uL (ref 0.1–1.0)
Monocytes Relative: 11 %
Neutro Abs: 3.8 K/uL (ref 1.7–7.7)
Neutrophils Relative %: 58 %
Platelets: 197 K/uL (ref 150–400)
RBC: 4.23 MIL/uL (ref 3.87–5.11)
RDW: 11.9 % (ref 11.5–15.5)
WBC: 6.5 K/uL (ref 4.0–10.5)
nRBC: 0 % (ref 0.0–0.2)

## 2024-02-11 LAB — FERRITIN: Ferritin: 207 ng/mL (ref 11–307)

## 2024-02-11 NOTE — Progress Notes (Signed)
 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  History of Present Illness Michelle [name] is a 78 year old female with iron overload who presents for follow-up regarding her phlebotomy treatment and ferritin levels.  She receives phlebotomy to manage iron overload, aiming to maintain ferritin levels between 50 and 100. Her last session was in June, and her current hematocrit is 41, above the target of 36. Ferritin levels have not been checked recently due to lab order issues.  She experiences significant itching, leading to bruising, with dermatological causes ruled out. She questions if the itching is related to her blood condition. Her sister has a similar condition and is considering chelation therapy.  She consumes a couple of glasses of wine each night, raising concerns about its impact on her iron overload. She recalls her sister underwent a liver scan to check for iron deposition.  The removal of her port was painful, but phlebotomy sessions have been easier since. She gets winded easily and is planning a vacation in December involving a lot of walking. She is interested in improving her stamina and is considering starting with short walks to build endurance.      Patient Active Problem List   Diagnosis Date Noted   Iron overload 06/20/2022   Dyspnea 03/25/2022    Port-A-Cath in place 08/27/2021   Macrocytosis without anemia 08/30/2020   Hereditary hemochromatosis 06/19/2020   Cough 05/14/2017   ETOH abuse 04/27/2017   Abnormal LFTs 04/26/2017   Thrombocytopenia 04/26/2017   Dyspnea on exertion 03/18/2017   Morbid obesity (HCC) 03/18/2017   Obstructive sleep apnea 12/07/2016   Essential hypertension 12/07/2016   Hx of adenomatous polyp of colon 03/21/2014   Postop Hyponatremia 08/07/2011   OA (osteoarthritis) of knee 08/05/2011   Pain in limb 05/15/2011    has no known allergies.  MEDICAL HISTORY: Past Medical History:  Diagnosis Date   Acute hypoxemic respiratory failure (HCC) 04/24/2017   Anxiety    Arthritis    back (04/24/2017)   Atherosclerosis of coronary artery    Blurry vision, left eye    YAG on the left; to be repaired (04/24/2017)   Chondromalacia of knee    right knee   Chronic bronchitis (HCC)    Chronic lower back pain    DDD (degenerative disc disease) 07/24/2011   Osteoarthritis-s/p LTKA 7'88, now right planned   Depression 07/24/2011   tx. depression only   DOE (dyspnea on exertion)    Fatty liver    History of diverticulitis 02/2018   Hx of adenomatous polyp of colon 03/21/2014   Hyperlipidemia    Hypertension    IBS (irritable bowel syndrome)    OSA on CPAP 12/07/2016   Pneumonia 1990s X 1   SOB (shortness of breath)    Vitamin D  deficiency     SURGICAL HISTORY: Past Surgical History:  Procedure Laterality Date   APPENDECTOMY  1967   BOTOX INJECTION  face   CATARACT EXTRACTION W/ INTRAOCULAR LENS IMPLANT Bilateral 2000s   CESAREAN SECTION  1972; 1975   COLONOSCOPY W/ BIOPSIES AND POLYPECTOMY  a few   DILATION AND CURETTAGE OF UTERUS     HYSTEROSCOPY WITH D & C  08/29/2006   and resection of endometrial polyp/notes 09/10/2010   IR IMAGING GUIDED PORT INSERTION  01/18/2021   IR REMOVAL TUN ACCESS W/ PORT W/O FL MOD SED  06/12/2023   JOINT REPLACEMENT     KNEE ARTHROSCOPY Bilateral     NASAL SEPTUM SURGERY  1972   OOPHORECTOMY Right 1994   TOTAL KNEE ARTHROPLASTY  08/05/2011   Procedure: TOTAL KNEE ARTHROPLASTY;  Surgeon: Dempsey LULLA Moan, MD;  Location: [facility];  Service: Orthopedics;  Laterality: Right;   TOTAL KNEE ARTHROPLASTY Left 05/2009   TUBAL LIGATION  1975    SOCIAL HISTORY: Social History   Socioeconomic History   Marital status: Divorced    Spouse name: Not on file   Number of children: 2   Years of education: Not on file   Highest education level: Not on file  Occupational History   Occupation: Retired haematologist  Tobacco Use   Smoking status: Former    Current packs/day: 0.00    Average packs/day: 0.1 packs/day for 36.0 years (4.3 ttl pk-yrs)    Types: Cigarettes    Start date: 06/19/1978    Quit date: 06/19/2014    Years since quitting: 9.6   Smokeless tobacco: Never  Vaping Use   Vaping status: Never Used  Substance and Sexual Activity   Alcohol  use: Yes    Alcohol /week: 21.0 standard drinks of alcohol     Types: 21 Glasses of wine per week    Comment: 04/24/2017 3, glasses of white wine qd   Drug use: No   Sexual activity: Never  Other Topics Concern   Not on file  Social History Narrative   Not on file   Social Drivers of Health   Financial Resource Strain: Not on file  Food Insecurity: Not on file  Transportation Needs: Not on file  Physical Activity: Not on file  Stress: Not on file  Social Connections: Not on file  Intimate Partner Violence: Not on file    FAMILY HISTORY: Family History  Problem Relation Age of Onset   Heart disease Mother        Rheumatic heart disease   Pulmonary embolism Mother    Heart disease Father        valvular heart disease   Stroke Father    Hypertension Sister    Colon cancer Neg Hx    Esophageal cancer Neg Hx    Rectal cancer Neg Hx    Stomach cancer Neg Hx    Pancreatic cancer Neg Hx     Review of Systems  Constitutional:  Negative for appetite change, chills, fatigue, fever and  unexpected weight change.  HENT:   Negative for hearing loss, lump/mass and trouble swallowing.   Eyes:  Negative for eye problems and icterus.  Respiratory:  Negative for chest tightness, cough and shortness of breath.   Cardiovascular:  Negative for chest pain, leg swelling and palpitations.  Gastrointestinal:  Negative for abdominal distention, abdominal pain, constipation, diarrhea, nausea and vomiting.  Endocrine: Negative for hot flashes.  Genitourinary:  Negative for difficulty urinating.   Musculoskeletal:  Negative for arthralgias.  Skin:  Negative for itching and rash.  Neurological:  Negative for dizziness, extremity weakness, headaches and numbness.  Hematological:  Negative  for adenopathy. Does not bruise/bleed easily.  Psychiatric/Behavioral:  Negative for depression. The patient is not nervous/anxious.       PHYSICAL EXAMINATION    Vitals:   02/11/24 1027  BP: 132/63  Pulse: 64  Resp: 18  Temp: (!) 97.3 F (36.3 C)  SpO2: 100%    Physical Exam Constitutional:      General: She is not in acute distress.    Appearance: Normal appearance. She is not toxic-appearing.  HENT:     Head: Normocephalic and atraumatic.     Mouth/Throat:     Mouth: Mucous membranes are moist.     Pharynx: Oropharynx is clear. No oropharyngeal exudate or posterior oropharyngeal erythema.  Eyes:     General: No scleral icterus. Cardiovascular:     Rate and Rhythm: Normal rate and regular rhythm.     Pulses: Normal pulses.     Heart sounds: Normal heart sounds.  Pulmonary:     Effort: Pulmonary effort is normal.     Breath sounds: Normal breath sounds.  Abdominal:     General: Abdomen is flat. Bowel sounds are normal. There is no distension.     Palpations: Abdomen is soft.     Tenderness: There is no abdominal tenderness.  Musculoskeletal:        General: No swelling.     Cervical back: Neck supple.  Lymphadenopathy:     Cervical: No cervical adenopathy.  Skin:    General:  Skin is warm and dry.     Findings: Bruising (faint bruising noted on bilaterl lower extremities) present. No rash.  Neurological:     General: No focal deficit present.     Mental Status: She is alert.  Psychiatric:        Mood and Affect: Mood normal.        Behavior: Behavior normal.     LABORATORY DATA:  CBC    Component Value Date/Time   WBC 6.5 02/11/2024 0958   RBC 4.23 02/11/2024 0958   HGB 14.8 02/11/2024 0958   HGB 14.7 04/28/2023 1357   HGB 13.7 10/04/2021 1424   HCT 41.1 02/11/2024 0958   HCT 40.4 10/04/2021 1424   PLT 197 02/11/2024 0958   PLT 186 04/28/2023 1357   MCV 97.2 02/11/2024 0958   MCV 99 (H) 02/18/2018 1217   MCH 35.0 (H) 02/11/2024 0958   MCHC 36.0 02/11/2024 0958   RDW 11.9 02/11/2024 0958   RDW 12.1 (L) 02/18/2018 1217   LYMPHSABS 1.9 02/11/2024 0958   LYMPHSABS 1.7 02/18/2018 1217   MONOABS 0.7 02/11/2024 0958   EOSABS 0.0 02/11/2024 0958   EOSABS 0.2 02/18/2018 1217   BASOSABS 0.0 02/11/2024 0958   BASOSABS 0.1 02/18/2018 1217    CMP     Component Value Date/Time   NA 139 06/17/2023 1518   NA 138 10/04/2021 1424   K 4.3 06/17/2023 1518   CL 105 06/17/2023 1518   CO2 27 06/17/2023 1518   GLUCOSE 119 (H) 06/17/2023 1518   BUN 18 06/17/2023 1518   BUN 21 10/04/2021 1424   CREATININE 1.03 (H) 06/17/2023 1518   CALCIUM 9.4 06/17/2023 1518   PROT 7.2 06/17/2023 1518   PROT 7.0 02/18/2018 1217   ALBUMIN 4.3 06/17/2023 1518   ALBUMIN 4.4 02/18/2018 1217   AST 29 06/17/2023 1518   ALT 33 06/17/2023 1518   ALKPHOS 65 06/17/2023 1518   BILITOT 0.8 06/17/2023 1518   GFRNONAA 56 (L) 06/17/2023 1518   GFRAA >60 08/09/2018 1835  ASSESSMENT and THERAPY PLAN:   Assessment and Plan Assessment & Plan Hereditary hemochromatosis (iron overload) Hereditary hemochromatosis managed with phlebotomy. Current hematocrit is 41, within target range. Ferritin levels need monitoring to remain between 50 and 100. - Order ferritin level. -  Continue phlebotomy as needed to maintain hematocrit above 36 and ferritin between 50 and 100. - Print lab results for her.  Pruritus and easy bruising, evaluation for hematologic and hepatic causes Pruritus and easy bruising with no dermatologic cause identified. Platelet count is normal. Differential diagnosis includes hematologic or hepatic causes. Alcohol  use may contribute to hepatic issues. - Order liver enzyme tests. - Consider imaging of the liver (ultrasound or MRI) to assess for hepatic involvement. - Evaluate ferritin levels to rule out iron-related causes.  Alcohol  use, risk assessment for hepatic complications Regular alcohol  consumption of two glasses of wine per night may increase risk of liver-related complications, particularly with hereditary hemochromatosis. Potential for liver injury or cirrhosis needs evaluation. - Assess liver function through enzyme tests. - Consider liver imaging to evaluate for alcohol -related liver injury. - Encourage decrease/cessation of ETOH consumption  Shortness of breath on exertion Patient attributes to deconditioning. Hematocrit is within target range, which should help prevent fatigue and shortness of breath. - Advise gradual increase in physical activity to improve stamina. - Instruct to seek evaluation if shortness of breath worsens or if chest pain occurs.  RTC once labs evaluated to determine need for phlebotomy.     All questions were answered. The patient knows to call the clinic with any problems, questions or concerns. We can certainly see the patient much sooner if necessary.  Total encounter time:30 minutes*in face-to-face visit time, chart review, lab review, care coordination, order entry, and documentation of the encounter time.    Morna Kendall, NP 02/11/24 10:55 AM Medical Oncology and Hematology [facility] [address] [place], [place] [address] Tel. [phone]    Fax. [phone]  *Total  Encounter Time as defined by the Centers for Medicare and Medicaid Services includes, in addition to the face-to-face time of a patient visit (documented in the note above) non-face-to-face time: obtaining and reviewing outside history, ordering and reviewing medications, tests or procedures, care coordination (communications with other health care professionals or caregivers) and documentation in the medical record.

## 2024-02-12 ENCOUNTER — Other Ambulatory Visit: Payer: Self-pay

## 2024-02-12 ENCOUNTER — Ambulatory Visit: Payer: Self-pay

## 2024-02-13 ENCOUNTER — Telehealth: Payer: Self-pay

## 2024-02-13 NOTE — Telephone Encounter (Signed)
 Pt called and was asking about lab results. She is concerned because she has noticed bruising to her bilateral legs and abd and is asking what she should do and what DNP advises per her lab results. Attempted to call pt. LVM for call back. This message forwarded to [facility].

## 2024-02-15 ENCOUNTER — Encounter: Payer: Self-pay | Admitting: Hematology and Oncology

## 2024-02-15 ENCOUNTER — Encounter: Payer: Self-pay | Admitting: Adult Health

## 2024-02-16 ENCOUNTER — Inpatient Hospital Stay

## 2024-02-16 LAB — CBC WITH DIFFERENTIAL (CANCER CENTER ONLY)
Abs Immature Granulocytes: 0.02 K/uL (ref 0.00–0.07)
Basophils Absolute: 0.1 K/uL (ref 0.0–0.1)
Basophils Relative: 1 %
Eosinophils Absolute: 0.1 K/uL (ref 0.0–0.5)
Eosinophils Relative: 1 %
HCT: 42 % (ref 36.0–46.0)
Hemoglobin: 14.7 g/dL (ref 12.0–15.0)
Immature Granulocytes: 0 %
Lymphocytes Relative: 21 %
Lymphs Abs: 1.6 K/uL (ref 0.7–4.0)
MCH: 34.9 pg — ABNORMAL HIGH (ref 26.0–34.0)
MCHC: 35 g/dL (ref 30.0–36.0)
MCV: 99.8 fL (ref 80.0–100.0)
Monocytes Absolute: 1 K/uL (ref 0.1–1.0)
Monocytes Relative: 13 %
Neutro Abs: 4.9 K/uL (ref 1.7–7.7)
Neutrophils Relative %: 64 %
Platelet Count: 194 K/uL (ref 150–400)
RBC: 4.21 MIL/uL (ref 3.87–5.11)
RDW: 12.3 % (ref 11.5–15.5)
WBC Count: 7.6 K/uL (ref 4.0–10.5)
nRBC: 0 % (ref 0.0–0.2)

## 2024-02-16 LAB — CMP (CANCER CENTER ONLY)
ALT: 23 U/L (ref 0–44)
AST: 19 U/L (ref 15–41)
Albumin: 4.1 g/dL (ref 3.5–5.0)
Alkaline Phosphatase: 71 U/L (ref 38–126)
Anion gap: 5 (ref 5–15)
BUN: 24 mg/dL — ABNORMAL HIGH (ref 8–23)
CO2: 28 mmol/L (ref 22–32)
Calcium: 9 mg/dL (ref 8.9–10.3)
Chloride: 105 mmol/L (ref 98–111)
Creatinine: 1.06 mg/dL — ABNORMAL HIGH (ref 0.44–1.00)
GFR, Estimated: 54 mL/min — ABNORMAL LOW (ref >60)
Glucose, Bld: 103 mg/dL — ABNORMAL HIGH (ref 70–99)
Potassium: 4.4 mmol/L (ref 3.5–5.1)
Sodium: 138 mmol/L (ref 135–145)
Total Bilirubin: 1 mg/dL (ref 0.0–1.2)
Total Protein: 7.1 g/dL (ref 6.5–8.1)

## 2024-02-16 LAB — FERRITIN: Ferritin: 301 ng/mL (ref 11–307)

## 2024-02-17 ENCOUNTER — Inpatient Hospital Stay

## 2024-02-17 DIAGNOSIS — Z95828 Presence of other vascular implants and grafts: Secondary | ICD-10-CM

## 2024-02-17 MED ORDER — SODIUM CHLORIDE 0.9 % IV SOLN: INTRAVENOUS | Status: AC

## 2024-02-17 NOTE — Patient Instructions (Signed)

## 2024-02-17 NOTE — Progress Notes (Signed)
 Verified with Dr. Loretha and Morna Kendall, NP that patient is appropriate for her phlebotomy today. Ferritin was 301 and hematocrit was 42.0 yesterday. Ok to proceed.  18g placed in R later [facility] area with significant blood return. IV fluids started.  Patient phlebotomy started at 1552 and ended at 1632 with 502g out. Patient tolerated well with no s/s of any issues. Declined 30 minute observation. Vss-  BP 127/71 (BP Location: Left Wrist, Patient Position: Sitting)   Pulse 69   Temp 98 F (36.7 C) (Oral)   Resp 18   SpO2 99%   Ambulatory to the lobby.

## 2024-02-20 ENCOUNTER — Other Ambulatory Visit: Payer: Self-pay | Admitting: *Deleted

## 2024-02-20 DIAGNOSIS — D696 Thrombocytopenia, unspecified: Secondary | ICD-10-CM

## 2024-02-23 ENCOUNTER — Inpatient Hospital Stay: Attending: Hematology and Oncology

## 2024-02-23 DIAGNOSIS — D696 Thrombocytopenia, unspecified: Secondary | ICD-10-CM

## 2024-02-23 LAB — CMP (CANCER CENTER ONLY)
ALT: 20 U/L (ref 0–44)
AST: 18 U/L (ref 15–41)
Albumin: 4.2 g/dL (ref 3.5–5.0)
Alkaline Phosphatase: 68 U/L (ref 38–126)
Anion gap: 8 (ref 5–15)
BUN: 23 mg/dL (ref 8–23)
CO2: 24 mmol/L (ref 22–32)
Calcium: 8.9 mg/dL (ref 8.9–10.3)
Chloride: 107 mmol/L (ref 98–111)
Creatinine: 1.07 mg/dL — ABNORMAL HIGH (ref 0.44–1.00)
GFR, Estimated: 54 mL/min — ABNORMAL LOW (ref >60)
Glucose, Bld: 93 mg/dL (ref 70–99)
Potassium: 4 mmol/L (ref 3.5–5.1)
Sodium: 139 mmol/L (ref 135–145)
Total Bilirubin: 0.7 mg/dL (ref 0.0–1.2)
Total Protein: 7.4 g/dL (ref 6.5–8.1)

## 2024-02-23 LAB — CBC WITH DIFFERENTIAL (CANCER CENTER ONLY)
Abs Immature Granulocytes: 0.02 K/uL (ref 0.00–0.07)
Basophils Absolute: 0.1 K/uL (ref 0.0–0.1)
Basophils Relative: 1 %
Eosinophils Absolute: 0.2 K/uL (ref 0.0–0.5)
Eosinophils Relative: 3 %
HCT: 38.9 % (ref 36.0–46.0)
Hemoglobin: 13.7 g/dL (ref 12.0–15.0)
Immature Granulocytes: 0 %
Lymphocytes Relative: 28 %
Lymphs Abs: 1.7 K/uL (ref 0.7–4.0)
MCH: 35 pg — ABNORMAL HIGH (ref 26.0–34.0)
MCHC: 35.2 g/dL (ref 30.0–36.0)
MCV: 99.5 fL (ref 80.0–100.0)
Monocytes Absolute: 0.7 K/uL (ref 0.1–1.0)
Monocytes Relative: 12 %
Neutro Abs: 3.4 K/uL (ref 1.7–7.7)
Neutrophils Relative %: 56 %
Platelet Count: 176 K/uL (ref 150–400)
RBC: 3.91 MIL/uL (ref 3.87–5.11)
RDW: 12.6 % (ref 11.5–15.5)
WBC Count: 6 K/uL (ref 4.0–10.5)
nRBC: 0 % (ref 0.0–0.2)

## 2024-02-23 LAB — IRON AND IRON BINDING CAPACITY (CC-WL,HP ONLY)
Iron: 131 ug/dL (ref 28–170)
Saturation Ratios: 35 % — ABNORMAL HIGH (ref 10.4–31.8)
TIBC: 370 ug/dL (ref 250–450)
UIBC: 239 ug/dL (ref 148–442)

## 2024-02-23 LAB — FERRITIN: Ferritin: 246 ng/mL (ref 11–307)

## 2024-02-24 ENCOUNTER — Inpatient Hospital Stay

## 2024-02-24 DIAGNOSIS — Z95828 Presence of other vascular implants and grafts: Secondary | ICD-10-CM

## 2024-02-24 NOTE — Progress Notes (Signed)
 Michelle [name] presents today for phlebotomy per MD orders. Phlebotomy procedure started at 1453 and ended at 1522. 500 grams removed. Patient observed for 30 minutes after procedure without any incident. Patient tolerated procedure well. IV needle removed intact.

## 2024-03-01 DIAGNOSIS — R109 Unspecified abdominal pain: Secondary | ICD-10-CM | POA: Diagnosis not present

## 2024-03-01 DIAGNOSIS — I10 Essential (primary) hypertension: Secondary | ICD-10-CM | POA: Diagnosis not present

## 2024-03-01 DIAGNOSIS — K760 Fatty (change of) liver, not elsewhere classified: Secondary | ICD-10-CM | POA: Diagnosis not present

## 2024-03-01 DIAGNOSIS — E669 Obesity, unspecified: Secondary | ICD-10-CM | POA: Diagnosis not present

## 2024-03-01 DIAGNOSIS — E1169 Type 2 diabetes mellitus with other specified complication: Secondary | ICD-10-CM | POA: Diagnosis not present

## 2024-03-08 ENCOUNTER — Inpatient Hospital Stay

## 2024-03-08 ENCOUNTER — Encounter: Payer: Self-pay | Admitting: Adult Health

## 2024-03-08 ENCOUNTER — Inpatient Hospital Stay: Admitting: Adult Health

## 2024-03-08 DIAGNOSIS — D696 Thrombocytopenia, unspecified: Secondary | ICD-10-CM

## 2024-03-08 LAB — CBC WITH DIFFERENTIAL (CANCER CENTER ONLY)
Abs Immature Granulocytes: 0 K/uL (ref 0.00–0.07)
Basophils Absolute: 0.1 K/uL (ref 0.0–0.1)
Basophils Relative: 1 %
Eosinophils Absolute: 0.2 K/uL (ref 0.0–0.5)
Eosinophils Relative: 4 %
HCT: 37.4 % (ref 36.0–46.0)
Hemoglobin: 13.2 g/dL (ref 12.0–15.0)
Immature Granulocytes: 0 %
Lymphocytes Relative: 41 %
Lymphs Abs: 1.7 K/uL (ref 0.7–4.0)
MCH: 34.9 pg — ABNORMAL HIGH (ref 26.0–34.0)
MCHC: 35.3 g/dL (ref 30.0–36.0)
MCV: 98.9 fL (ref 80.0–100.0)
Monocytes Absolute: 0.5 K/uL (ref 0.1–1.0)
Monocytes Relative: 13 %
Neutro Abs: 1.7 K/uL (ref 1.7–7.7)
Neutrophils Relative %: 41 %
Platelet Count: 196 K/uL (ref 150–400)
RBC: 3.78 MIL/uL — ABNORMAL LOW (ref 3.87–5.11)
RDW: 12.7 % (ref 11.5–15.5)
WBC Count: 4.2 K/uL (ref 4.0–10.5)
nRBC: 0 % (ref 0.0–0.2)

## 2024-03-08 LAB — IRON AND IRON BINDING CAPACITY (CC-WL,HP ONLY)
Iron: 70 ug/dL (ref 28–170)
Saturation Ratios: 18 % (ref 10.4–31.8)
TIBC: 388 ug/dL (ref 250–450)
UIBC: 318 ug/dL (ref 148–442)

## 2024-03-08 LAB — CMP (CANCER CENTER ONLY)
ALT: 22 U/L (ref 0–44)
AST: 19 U/L (ref 15–41)
Albumin: 4 g/dL (ref 3.5–5.0)
Alkaline Phosphatase: 60 U/L (ref 38–126)
Anion gap: 8 (ref 5–15)
BUN: 20 mg/dL (ref 8–23)
CO2: 25 mmol/L (ref 22–32)
Calcium: 9 mg/dL (ref 8.9–10.3)
Chloride: 108 mmol/L (ref 98–111)
Creatinine: 1.1 mg/dL — ABNORMAL HIGH (ref 0.44–1.00)
GFR, Estimated: 52 mL/min — ABNORMAL LOW (ref >60)
Glucose, Bld: 94 mg/dL (ref 70–99)
Potassium: 4 mmol/L (ref 3.5–5.1)
Sodium: 141 mmol/L (ref 135–145)
Total Bilirubin: 0.6 mg/dL (ref 0.0–1.2)
Total Protein: 6.6 g/dL (ref 6.5–8.1)

## 2024-03-08 LAB — FERRITIN: Ferritin: 120 ng/mL (ref 11–307)

## 2024-03-08 NOTE — Progress Notes (Unsigned)
 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  History of Present Illness Michelle [name] is a 78 year old female with hemochromatosis who presents for follow-up and evaluation.  She undergoes regular phlebotomies to manage iron levels, with ferritin decreasing from 301 to 246 over one week. Liver function tests are normal, but she is concerned about iron deposition in her liver and other organs, requesting an MRI as she has not had one since her diagnosis.  She experiences persistent upper abdominal discomfort, despite abstaining from alcohol  for over a week. Pain is managed with Tylenol , two tablets per day. A recent urine culture was negative for kidney infection.   Patient Active Problem List   Diagnosis Date Noted   Iron overload 06/20/2022   Dyspnea 03/25/2022   Port-A-Cath in place 08/27/2021   Macrocytosis without anemia 08/30/2020   Hereditary hemochromatosis 06/19/2020   Cough 05/14/2017   ETOH abuse 04/27/2017   Abnormal LFTs 04/26/2017   Thrombocytopenia 04/26/2017   Dyspnea on exertion 03/18/2017   Morbid obesity (HCC) 03/18/2017   Obstructive sleep apnea 12/07/2016   Essential hypertension 12/07/2016   Hx of adenomatous polyp of colon 03/21/2014   Postop Hyponatremia 08/07/2011   OA (osteoarthritis) of knee 08/05/2011    Pain in limb 05/15/2011    has no known allergies.  MEDICAL HISTORY: Past Medical History:  Diagnosis Date   Acute hypoxemic respiratory failure (HCC) 04/24/2017   Anxiety    Arthritis    back (04/24/2017)   Atherosclerosis of coronary artery    Blurry vision, left eye    YAG on the left; to be repaired (04/24/2017)   Chondromalacia of knee    right knee   Chronic bronchitis (HCC)    Chronic lower back pain    DDD (degenerative disc disease) 07/24/2011   Osteoarthritis-s/p LTKA 7'88, now right planned   Depression 07/24/2011   tx. depression only   DOE (dyspnea on exertion)    Fatty liver    History of diverticulitis 02/2018   Hx of adenomatous polyp of colon 03/21/2014   Hyperlipidemia    Hypertension    IBS (irritable bowel syndrome)    OSA on CPAP 12/07/2016   Pneumonia 1990s X 1   SOB (shortness of breath)    Vitamin D  deficiency     SURGICAL HISTORY: Past Surgical History:  Procedure Laterality Date   APPENDECTOMY  1967   BOTOX INJECTION     face   CATARACT EXTRACTION W/ INTRAOCULAR LENS IMPLANT Bilateral 2000s   CESAREAN SECTION  1972; 1975   COLONOSCOPY W/ BIOPSIES AND POLYPECTOMY  a few   DILATION AND CURETTAGE OF UTERUS     HYSTEROSCOPY WITH D & C  08/29/2006   and resection of endometrial polyp/notes 09/10/2010   IR IMAGING GUIDED PORT INSERTION  01/18/2021   IR REMOVAL TUN ACCESS W/ PORT W/O FL MOD SED  06/12/2023   JOINT REPLACEMENT  KNEE ARTHROSCOPY Bilateral    NASAL SEPTUM SURGERY  1972   OOPHORECTOMY Right 1994   TOTAL KNEE ARTHROPLASTY  08/05/2011   Procedure: TOTAL KNEE ARTHROPLASTY;  Surgeon: Dempsey LULLA Moan, MD;  Location: [facility];  Service: Orthopedics;  Laterality: Right;   TOTAL KNEE ARTHROPLASTY Left 05/2009   TUBAL LIGATION  1975    SOCIAL HISTORY: Social History   Socioeconomic History   Marital status: Divorced    Spouse name: Not on file   Number of children: 2   Years of education: Not on file   Highest education  level: Not on file  Occupational History   Occupation: Retired haematologist  Tobacco Use   Smoking status: Former    Current packs/day: 0.00    Average packs/day: 0.1 packs/day for 36.0 years (4.3 ttl pk-yrs)    Types: Cigarettes    Start date: 06/19/1978    Quit date: 06/19/2014    Years since quitting: 9.7   Smokeless tobacco: Never  Vaping Use   Vaping status: Never Used  Substance and Sexual Activity   Alcohol  use: Yes    Alcohol /week: 21.0 standard drinks of alcohol     Types: 21 Glasses of wine per week    Comment: 04/24/2017 3, glasses of white wine qd   Drug use: No   Sexual activity: Never  Other Topics Concern   Not on file  Social History Narrative   Not on file   Social Drivers of Health   Financial Resource Strain: Not on file  Food Insecurity: Not on file  Transportation Needs: Not on file  Physical Activity: Not on file  Stress: Not on file  Social Connections: Not on file  Intimate Partner Violence: Not on file    FAMILY HISTORY: Family History  Problem Relation Age of Onset   Heart disease Mother        Rheumatic heart disease   Pulmonary embolism Mother    Heart disease Father        valvular heart disease   Stroke Father    Hypertension Sister    Colon cancer Neg Hx    Esophageal cancer Neg Hx    Rectal cancer Neg Hx    Stomach cancer Neg Hx    Pancreatic cancer Neg Hx     Review of Systems  Constitutional:  Negative for appetite change, chills, fatigue, fever and unexpected weight change.  HENT:   Negative for hearing loss, lump/mass and trouble swallowing.   Eyes:  Negative for eye problems and icterus.  Respiratory:  Negative for chest tightness, cough and shortness of breath.   Cardiovascular:  Negative for chest pain, leg swelling and palpitations.  Gastrointestinal:  Negative for abdominal distention, abdominal pain, constipation, diarrhea, nausea and vomiting.  Endocrine: Negative for hot flashes.  Genitourinary:  Negative for  difficulty urinating.   Musculoskeletal:  Negative for arthralgias.  Skin:  Negative for itching and rash.  Neurological:  Negative for dizziness, extremity weakness, headaches and numbness.  Hematological:  Negative for adenopathy. Does not bruise/bleed easily.  Psychiatric/Behavioral:  Negative for depression. The patient is not nervous/anxious.       PHYSICAL EXAMINATION    Vitals:   03/08/24 1146  BP: 133/74  Pulse: 79  Resp: 16  Temp: 97.6 F (36.4 C)  SpO2: 100%    Physical Exam Constitutional:      General: She is not in acute distress.    Appearance: Normal appearance. She is not toxic-appearing.  HENT:  Head: Normocephalic and atraumatic.     Mouth/Throat:     Mouth: Mucous membranes are moist.     Pharynx: Oropharynx is clear. No oropharyngeal exudate or posterior oropharyngeal erythema.  Eyes:     General: No scleral icterus. Cardiovascular:     Rate and Rhythm: Normal rate and regular rhythm.     Pulses: Normal pulses.     Heart sounds: Normal heart sounds.  Pulmonary:     Effort: Pulmonary effort is normal.     Breath sounds: Normal breath sounds.  Abdominal:     General: Abdomen is flat. Bowel sounds are normal. There is no distension.     Palpations: Abdomen is soft.     Tenderness: There is no abdominal tenderness.  Musculoskeletal:        General: No swelling.     Cervical back: Neck supple.  Lymphadenopathy:     Cervical: No cervical adenopathy.  Skin:    General: Skin is warm and dry.     Findings: No rash.  Neurological:     General: No focal deficit present.     Mental Status: She is alert.  Psychiatric:        Mood and Affect: Mood normal.        Behavior: Behavior normal.     LABORATORY DATA:  CBC    Component Value Date/Time   WBC 4.2 03/08/2024 1126   WBC 6.5 02/11/2024 0958   RBC 3.78 (L) 03/08/2024 1126   HGB 13.2 03/08/2024 1126   HGB 13.7 10/04/2021 1424   HCT 37.4 03/08/2024 1126   HCT 40.4 10/04/2021 1424    PLT 196 03/08/2024 1126   MCV 98.9 03/08/2024 1126   MCV 99 (H) 02/18/2018 1217   MCH 34.9 (H) 03/08/2024 1126   MCHC 35.3 03/08/2024 1126   RDW 12.7 03/08/2024 1126   RDW 12.1 (L) 02/18/2018 1217   LYMPHSABS 1.7 03/08/2024 1126   LYMPHSABS 1.7 02/18/2018 1217   MONOABS 0.5 03/08/2024 1126   EOSABS 0.2 03/08/2024 1126   EOSABS 0.2 02/18/2018 1217   BASOSABS 0.1 03/08/2024 1126   BASOSABS 0.1 02/18/2018 1217    CMP     Component Value Date/Time   NA 141 03/08/2024 1126   NA 138 10/04/2021 1424   K 4.0 03/08/2024 1126   CL 108 03/08/2024 1126   CO2 25 03/08/2024 1126   GLUCOSE 94 03/08/2024 1126   BUN 20 03/08/2024 1126   BUN 21 10/04/2021 1424   CREATININE 1.10 (H) 03/08/2024 1126   CALCIUM 9.0 03/08/2024 1126   PROT 6.6 03/08/2024 1126   PROT 7.0 02/18/2018 1217   ALBUMIN 4.0 03/08/2024 1126   ALBUMIN 4.4 02/18/2018 1217   AST 19 03/08/2024 1126   ALT 22 03/08/2024 1126   ALKPHOS 60 03/08/2024 1126   BILITOT 0.6 03/08/2024 1126   GFRNONAA 52 (L) 03/08/2024 1126   GFRAA >60 08/09/2018 1835     ASSESSMENT and THERAPY PLAN:   No problem-specific Assessment & Plan notes found for this encounter.   Assessment and Plan Assessment & Plan Hereditary hemochromatosis Chronic hereditary hemochromatosis with recent decrease in ferritin levels. No significant liver enzyme abnormalities. Abdominal pain possibly due to iron deposition. Phlebotomy well-tolerated. - Ordered MRI of the liver to assess for iron deposition and fatty liver. - Scheduled phlebotomy and lab work in two weeks. - Scheduled follow-up in four weeks with phlebotomy and lab work. - Advised to drink plenty of water to maintain volume and prevent dizziness. - Monitor  ferritin levels and adjust phlebotomy frequency as needed.  Constipation Chronic constipation, possibly exacerbated by Mounjaro  use. No current laxative use. - Recommended starting Miralax  for constipation management. - Advised to increase  water intake to 60-80 ounces per day.    All questions were answered. The patient knows to call the clinic with any problems, questions or concerns. We can certainly see the patient much sooner if necessary.  Total encounter time:30 minutes*in face-to-face visit time, chart review, lab review, care coordination, order entry, and documentation of the encounter time.    Morna Kendall, NP 03/08/24 12:22 PM Medical Oncology and Hematology [facility] [address] [place], [place] [address] Tel. [phone]    Fax. [phone]  *Total Encounter Time as defined by the Centers for Medicare and Medicaid Services includes, in addition to the face-to-face time of a patient visit (documented in the note above) non-face-to-face time: obtaining and reviewing outside history, ordering and reviewing medications, tests or procedures, care coordination (communications with other health care professionals or caregivers) and documentation in the medical record.

## 2024-03-09 ENCOUNTER — Other Ambulatory Visit: Payer: Self-pay | Admitting: Adult Health

## 2024-03-09 ENCOUNTER — Inpatient Hospital Stay

## 2024-03-09 DIAGNOSIS — Z95828 Presence of other vascular implants and grafts: Secondary | ICD-10-CM

## 2024-03-09 NOTE — Patient Instructions (Signed)

## 2024-03-09 NOTE — Progress Notes (Signed)
  Michelle [name] presents today for phlebotomy per MD orders. 18G needle placed in R later [facility] with blood return noted. Phlebotomy procedure started at 1502 and ended at 1534. 523 grams removed. Patient observed for 30 minutes after procedure without any incident. Patient tolerated procedure well. IV needle removed intact. VSS at discharge.

## 2024-03-11 ENCOUNTER — Encounter: Payer: Self-pay | Admitting: Hematology and Oncology

## 2024-03-16 ENCOUNTER — Other Ambulatory Visit (HOSPITAL_COMMUNITY): Payer: Self-pay

## 2024-03-16 DIAGNOSIS — L821 Other seborrheic keratosis: Secondary | ICD-10-CM | POA: Diagnosis not present

## 2024-03-16 DIAGNOSIS — L820 Inflamed seborrheic keratosis: Secondary | ICD-10-CM | POA: Diagnosis not present

## 2024-03-16 DIAGNOSIS — L570 Actinic keratosis: Secondary | ICD-10-CM | POA: Diagnosis not present

## 2024-03-16 DIAGNOSIS — L814 Other melanin hyperpigmentation: Secondary | ICD-10-CM | POA: Diagnosis not present

## 2024-03-16 DIAGNOSIS — D692 Other nonthrombocytopenic purpura: Secondary | ICD-10-CM | POA: Diagnosis not present

## 2024-03-16 DIAGNOSIS — D225 Melanocytic nevi of trunk: Secondary | ICD-10-CM | POA: Diagnosis not present

## 2024-03-22 ENCOUNTER — Inpatient Hospital Stay

## 2024-03-23 ENCOUNTER — Ambulatory Visit (HOSPITAL_COMMUNITY): Admission: RE | Admit: 2024-03-23 | Source: Ambulatory Visit

## 2024-03-23 ENCOUNTER — Telehealth: Payer: Self-pay

## 2024-03-23 ENCOUNTER — Inpatient Hospital Stay

## 2024-03-23 NOTE — Telephone Encounter (Signed)
 Telephone/Encounter Note  Notification: Patient informed that MRI request was denied.  Patient Response: Patient stated she had already been contacted previously and was aware of denial.  Plan: Patient reports she has an appointment with provider on Monday and will discuss possibility of obtaining MRI order at that visit.

## 2024-04-04 NOTE — Progress Notes (Unsigned)
 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 female with medical history significant for heterozygous HH who presents for a follow up visit. The patient's last visit was on 03/08/2024. In the interim since the last visit she requested a transfer of care.  On exam today Michelle [name] reports she changed providers because she was hoping for more communication around her disease.  She reports does have some occasional pain in her back on the right side and fears that it may be her liver.  She reports that she is currently taking Mounjaro  but has not lost any weight yet.  She reports that she has been tolerating her phlebotomies well with no lightheadedness, dizziness, or shortness of breath.  She reports that she does her best to try to avoid iron rich foods such as red meat and spinach.  She reports that she does enjoy eating chicken but does not eat much in the way of fish.  She has had no recent issues with lightheadedness, dizziness, or shortness of breath.  She reports that she is looking forward to an upcoming [place] starting on [place].  Otherwise she denies any recent fevers, chills, sweats, nausea, vomiting or diarrhea.  A full 10 point ROS is otherwise negative.  MEDICAL HISTORY:  Past Medical History:   Diagnosis Date   Acute hypoxemic respiratory failure (HCC) 04/24/2017   Anxiety    Arthritis    back (04/24/2017)   Atherosclerosis of coronary artery    Blurry vision, left eye    YAG on the left; to be repaired (04/24/2017)   Chondromalacia of knee    right knee   Chronic bronchitis (HCC)    Chronic lower back pain    DDD (degenerative disc disease) 07/24/2011   Osteoarthritis-s/p LTKA 7'88, now right planned   Depression 07/24/2011   tx. depression only   DOE (dyspnea on exertion)    Fatty liver    History of diverticulitis 02/2018   Hx of adenomatous polyp of colon 03/21/2014   Hyperlipidemia    Hypertension    IBS (irritable bowel syndrome)    OSA on CPAP 12/07/2016   Pneumonia 1990s X 1   SOB (shortness of breath)    Vitamin D  deficiency     SURGICAL HISTORY: Past Surgical History:  Procedure Laterality Date   APPENDECTOMY  1967   BOTOX INJECTION     face   CATARACT EXTRACTION W/ INTRAOCULAR LENS IMPLANT Bilateral 2000s   CESAREAN SECTION  1972; 1975   COLONOSCOPY W/ BIOPSIES AND POLYPECTOMY  a few   DILATION AND CURETTAGE OF UTERUS     HYSTEROSCOPY WITH D & C  08/29/2006   and resection of endometrial polyp/notes 09/10/2010   IR IMAGING GUIDED PORT INSERTION  01/18/2021   IR REMOVAL TUN ACCESS W/ PORT W/O FL MOD SED  06/12/2023   JOINT REPLACEMENT     KNEE ARTHROSCOPY Bilateral    NASAL SEPTUM SURGERY  1972   OOPHORECTOMY Right 1994   TOTAL KNEE ARTHROPLASTY  08/05/2011   Procedure: TOTAL KNEE ARTHROPLASTY;  Surgeon: Dempsey LULLA Moan, MD;  Location: [facility];  Service: Orthopedics;  Laterality: Right;   TOTAL KNEE ARTHROPLASTY Left 05/2009   TUBAL LIGATION  1975    SOCIAL HISTORY: Social History   Socioeconomic History   Marital status: Divorced    Spouse name: Not on file   Number of children: 2   Years of education: Not on file   Highest education level: Not on file  Occupational History   Occupation: Retired haematologist  Tobacco Use   Smoking  status: Former    Current packs/day: 0.00    Average packs/day: 0.1 packs/day for 36.0 years (4.3 ttl pk-yrs)    Types: Cigarettes    Start date: 06/19/1978    Quit date: 06/19/2014    Years since quitting: 9.8   Smokeless tobacco: Never  Vaping Use   Vaping status: Never Used  Substance and Sexual Activity   Alcohol  use: Yes    Alcohol /week: 21.0 standard drinks of alcohol     Types: 21 Glasses of wine per week    Comment: 04/24/2017 3, glasses of white wine qd   Drug use: No   Sexual activity: Never  Other Topics Concern   Not on file  Social History Narrative   Not on file   Social Drivers of Health   Tobacco Use: Medium Risk (04/05/2024)   Patient History    Smoking Tobacco Use: Former    Smokeless Tobacco Use: Never    Passive Exposure: Not on Actuary Strain: Not on file  Food Insecurity: Patient Declined (04/01/2024)   Epic    Worried About Programme Researcher, Broadcasting/film/video in the Last Year: Patient declined    Barista in the Last Year: Patient declined  Transportation Needs: Patient Declined (04/01/2024)   Epic    Lack of Transportation (Medical): Patient declined    Lack of Transportation (Non-Medical): Patient declined  Physical Activity: Not on file  Stress: Not on file  Social Connections: Not on file  Intimate Partner Violence: Not on file  Depression (PHQ2-9): Low Risk (04/05/2024)   Depression (PHQ2-9)    PHQ-2 Score: 0  Alcohol  Screen: Not on file  Housing: Patient Declined (04/01/2024)   Epic    Unable to Pay for Housing in the Last Year: Patient declined    Number of Times Moved in the Last Year: Not on file    Homeless in the Last Year: Patient declined  Utilities: Patient Declined (04/01/2024)   Epic    Threatened with loss of utilities: Patient declined  Health Literacy: Not on file    FAMILY HISTORY: Family History  Problem Relation Age of Onset   Heart disease Mother        Rheumatic heart disease   Pulmonary embolism Mother     Heart disease Father        valvular heart disease   Stroke Father    Hypertension Sister    Colon cancer Neg Hx    Esophageal cancer Neg Hx    Rectal cancer Neg Hx    Stomach cancer Neg Hx    Pancreatic cancer Neg Hx     ALLERGIES:  has no known allergies.  MEDICATIONS:  Current Outpatient Medications  Medication Sig Dispense Refill   ALPRAZolam (NIRAVAM) 0.25 MG dissolvable tablet Take 0.25 mg by mouth at bedtime as needed for anxiety.  azithromycin  (ZITHROMAX  Z-PAK) 250 MG tablet Take 2 pills on Day 1 and 1 pill thereafter x 4 days 6 each 0   losartan  (COZAAR ) 25 MG tablet Take 25 mg by mouth daily.     sertraline  (ZOLOFT ) 100 MG tablet Take 100 mg by mouth 2 (two) times a day.   0   tirzepatide  (MOUNJARO ) 10 MG/0.5ML Pen Inject 10 mg into the skin once a week. 2 mL 3   No current facility-administered medications for this visit.   Facility-Administered Medications Ordered in Other Visits  Medication Dose Route Frequency Provider Last Rate Last Admin   sodium chloride  flush (NS) 0.9 % injection 10 mL  10 mL Intravenous PRN Iruku, Praveena, MD   10 mL at 12/31/21 1536    REVIEW OF SYSTEMS:   Constitutional: ( - ) fevers, ( - )  chills , ( - ) night sweats Eyes: ( - ) blurriness of vision, ( - ) double vision, ( - ) watery eyes Ears, nose, mouth, throat, and face: ( - ) mucositis, ( - ) sore throat Respiratory: ( - ) cough, ( - ) dyspnea, ( - ) wheezes Cardiovascular: ( - ) palpitation, ( - ) chest discomfort, ( - ) lower extremity swelling Gastrointestinal:  ( - ) nausea, ( - ) heartburn, ( - ) change in bowel habits Skin: ( - ) abnormal skin rashes Lymphatics: ( - ) new lymphadenopathy, ( - ) easy bruising Neurological: ( - ) numbness, ( - ) tingling, ( - ) new weaknesses Behavioral/Psych: ( - ) mood change, ( - ) new changes  All other systems were reviewed with the patient and are negative.  PHYSICAL EXAMINATION:  Vitals:   04/05/24 1348 04/05/24 1349  BP: (!)  154/85 (!) 151/83  Pulse: 75   Resp: 13   Temp: 97.8 F (36.6 C)   SpO2: 100%    Filed Weights   04/05/24 1348  Weight: 209 lb 1.6 oz (94.8 kg)    GENERAL: Well-appearing elderly Caucasian female, alert, no distress and comfortable SKIN: skin color, texture, turgor are normal, no rashes or significant lesions EYES: conjunctiva are pink and non-injected, sclera clear LUNGS: clear to auscultation and percussion with normal breathing effort HEART: regular rate & rhythm and no murmurs and no lower extremity edema Musculoskeletal: no cyanosis of digits and no clubbing  PSYCH: alert & oriented x 3, fluent speech NEURO: no focal motor/sensory deficits  LABORATORY DATA:  I have reviewed the data as listed    Latest Ref Rng & Units 04/05/2024    1:23 PM 03/08/2024   11:26 AM 02/23/2024    1:33 PM  CBC  WBC 4.0 - 10.5 K/uL 4.5  4.2  6.0   Hemoglobin 12.0 - 15.0 g/dL 86.8  86.7  86.2   Hematocrit 36.0 - 46.0 % 37.5  37.4  38.9   Platelets 150 - 400 K/uL 215  196  176        Latest Ref Rng & Units 04/05/2024    1:23 PM 03/08/2024   11:26 AM 02/23/2024    1:33 PM  CMP  Glucose 70 - 99 mg/dL 95  94  93   BUN 8 - 23 mg/dL 17  20  23    Creatinine 0.44 - 1.00 mg/dL 9.03  8.89  8.92   Sodium 135 - 145 mmol/L 141  141  139   Potassium 3.5 - 5.1 mmol/L 4.1  4.0  4.0   Chloride 98 - 111 mmol/L 107  108  107   CO2 22 - 32 mmol/L 23  25  24    Calcium 8.9 - 10.3 mg/dL 9.2  9.0  8.9   Total Protein 6.5 - 8.1 g/dL 7.2  6.6  7.4   Total Bilirubin 0.0 - 1.2 mg/dL 0.6  0.6  0.7   Alkaline Phos 38 - 126 U/L 71  60  68   AST 15 - 41 U/L 22  19  18    ALT 0 - 44 U/L 19  22  20      RADIOGRAPHIC STUDIES: No results found.  ASSESSMENT & PLAN AYONA YNIGUEZ 78 y.o. female with medical history significant for heterozygous HH who presents for a follow up visit.  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 Was initially found to  have ferritin >1000 (Feb 2022), was phlebotomized with target 50-100.  --At this time would recommend phlebotomizing her down to a ferritin of 50 and then monitoring thereafter. --Discussed with the patient that phlebotomy for patient with heterozygous hemochromatosis is controversial as patients with this rarely develop iron deposition disease. --labs today show WBC 4.5, Hgb 13.1, MCV 97.9, Plt 215 --discussed treatment options moving forward. Patient would like to proceed with phlebotomy tomorrow and then monitor thereafter. --RTC in 3 months time to reevaluate.  No orders of the defined types were placed in this encounter.   All questions were answered. The patient knows to call the clinic with any problems, questions or concerns.  A total of more than 40 minutes were spent on this encounter with face-to-face time and non-face-to-face time, including preparing to see the patient, ordering tests and/or medications, counseling the patient and coordination of care as outlined above.   Norleen IVAR Kidney, MD Department of Hematology/Oncology [facility] Phone: [phone] Pager: [phone] Email: [email or web address]  04/06/2024 3:39 PM

## 2024-04-05 ENCOUNTER — Encounter: Payer: Self-pay | Admitting: Hematology and Oncology

## 2024-04-05 ENCOUNTER — Inpatient Hospital Stay: Attending: Hematology and Oncology

## 2024-04-05 ENCOUNTER — Inpatient Hospital Stay: Admitting: Hematology and Oncology

## 2024-04-05 DIAGNOSIS — Z87891 Personal history of nicotine dependence: Secondary | ICD-10-CM | POA: Diagnosis not present

## 2024-04-05 DIAGNOSIS — D696 Thrombocytopenia, unspecified: Secondary | ICD-10-CM

## 2024-04-05 LAB — CMP (CANCER CENTER ONLY)
ALT: 19 U/L (ref 0–44)
AST: 22 U/L (ref 15–41)
Albumin: 4.4 g/dL (ref 3.5–5.0)
Alkaline Phosphatase: 71 U/L (ref 38–126)
Anion gap: 12 (ref 5–15)
BUN: 17 mg/dL (ref 8–23)
CO2: 23 mmol/L (ref 22–32)
Calcium: 9.2 mg/dL (ref 8.9–10.3)
Chloride: 107 mmol/L (ref 98–111)
Creatinine: 0.96 mg/dL (ref 0.44–1.00)
GFR, Estimated: >60 mL/min (ref >60)
Glucose, Bld: 95 mg/dL (ref 70–99)
Potassium: 4.1 mmol/L (ref 3.5–5.1)
Sodium: 141 mmol/L (ref 135–145)
Total Bilirubin: 0.6 mg/dL (ref 0.0–1.2)
Total Protein: 7.2 g/dL (ref 6.5–8.1)

## 2024-04-05 LAB — IRON AND IRON BINDING CAPACITY (CC-WL,HP ONLY)
Iron: 67 ug/dL (ref 28–170)
Saturation Ratios: 16 % (ref 10.4–31.8)
TIBC: 412 ug/dL (ref 250–450)
UIBC: 345 ug/dL

## 2024-04-05 LAB — CBC WITH DIFFERENTIAL (CANCER CENTER ONLY)
Abs Immature Granulocytes: 0.01 K/uL (ref 0.00–0.07)
Basophils Absolute: 0.1 K/uL (ref 0.0–0.1)
Basophils Relative: 2 %
Eosinophils Absolute: 0.1 K/uL (ref 0.0–0.5)
Eosinophils Relative: 3 %
HCT: 37.5 % (ref 36.0–46.0)
Hemoglobin: 13.1 g/dL (ref 12.0–15.0)
Immature Granulocytes: 0 %
Lymphocytes Relative: 27 %
Lymphs Abs: 1.2 K/uL (ref 0.7–4.0)
MCH: 34.2 pg — ABNORMAL HIGH (ref 26.0–34.0)
MCHC: 34.9 g/dL (ref 30.0–36.0)
MCV: 97.9 fL (ref 80.0–100.0)
Monocytes Absolute: 0.5 K/uL (ref 0.1–1.0)
Monocytes Relative: 11 %
Neutro Abs: 2.6 K/uL (ref 1.7–7.7)
Neutrophils Relative %: 57 %
Platelet Count: 215 K/uL (ref 150–400)
RBC: 3.83 MIL/uL — ABNORMAL LOW (ref 3.87–5.11)
RDW: 12.5 % (ref 11.5–15.5)
WBC Count: 4.5 K/uL (ref 4.0–10.5)
nRBC: 0 % (ref 0.0–0.2)

## 2024-04-05 LAB — FERRITIN: Ferritin: 65 ng/mL (ref 11–307)

## 2024-04-05 MED ORDER — AZITHROMYCIN 250 MG PO TABS: ORAL_TABLET | ORAL | 0 refills | Status: AC

## 2024-04-06 ENCOUNTER — Encounter: Payer: Self-pay | Admitting: Hematology and Oncology

## 2024-04-06 ENCOUNTER — Inpatient Hospital Stay

## 2024-04-08 ENCOUNTER — Other Ambulatory Visit (HOSPITAL_COMMUNITY): Payer: Self-pay

## 2024-04-20 ENCOUNTER — Inpatient Hospital Stay: Admitting: Adult Health

## 2024-05-12 ENCOUNTER — Other Ambulatory Visit (HOSPITAL_COMMUNITY): Payer: Self-pay

## 2024-05-12 MED ORDER — MOUNJARO 10 MG/0.5ML ~~LOC~~ SOAJ
10.0000 mg | SUBCUTANEOUS | 3 refills | Status: AC
  Filled 2024-05-12: qty 2, 28d supply, fill #0

## 2024-05-13 ENCOUNTER — Inpatient Hospital Stay

## 2024-05-13 ENCOUNTER — Inpatient Hospital Stay: Admitting: Adult Health

## 2024-05-14 ENCOUNTER — Inpatient Hospital Stay: Attending: Hematology and Oncology

## 2024-05-14 DIAGNOSIS — Z95828 Presence of other vascular implants and grafts: Secondary | ICD-10-CM

## 2024-05-14 LAB — CBC (CANCER CENTER ONLY)
HCT: 40.1 % (ref 36.0–46.0)
Hemoglobin: 14.2 g/dL (ref 12.0–15.0)
MCH: 34.5 pg — ABNORMAL HIGH (ref 26.0–34.0)
MCHC: 35.4 g/dL (ref 30.0–36.0)
MCV: 97.6 fL (ref 80.0–100.0)
Platelet Count: 171 K/uL (ref 150–400)
RBC: 4.11 MIL/uL (ref 3.87–5.11)
RDW: 12.4 % (ref 11.5–15.5)
WBC Count: 8.3 K/uL (ref 4.0–10.5)
nRBC: 0 % (ref 0.0–0.2)

## 2024-05-17 ENCOUNTER — Inpatient Hospital Stay

## 2024-05-18 ENCOUNTER — Inpatient Hospital Stay

## 2024-05-20 ENCOUNTER — Telehealth: Payer: Self-pay | Admitting: Hematology and Oncology

## 2024-05-20 NOTE — Telephone Encounter (Signed)
 Pt called to see if we could move

## 2024-05-21 ENCOUNTER — Inpatient Hospital Stay

## 2024-05-21 NOTE — Progress Notes (Signed)
 Michelle [name] presents today for phlebotomy per MD orders. 18G needle placed in left later [facility] with blood return noted. Phlebotomy procedure started at 1611 and ended at 1623. 517 grams removed. Patient observed for 30 minutes after procedure without any incident. Patient tolerated procedure well. IV needle removed intact. VSS at discharge

## 2024-06-28 ENCOUNTER — Inpatient Hospital Stay: Admitting: Hematology and Oncology

## 2024-06-28 ENCOUNTER — Inpatient Hospital Stay

## 2024-06-29 ENCOUNTER — Inpatient Hospital Stay
